# Patient Record
Sex: Female | Born: 1945
Health system: Southern US, Community
[De-identification: ages and names within clinical notes are randomized; demographics above are authoritative.]

## PROBLEM LIST (undated history)

## (undated) DIAGNOSIS — F32A Depression, unspecified: Secondary | ICD-10-CM

## (undated) DIAGNOSIS — I4891 Unspecified atrial fibrillation: Secondary | ICD-10-CM

## (undated) DIAGNOSIS — K219 Gastro-esophageal reflux disease without esophagitis: Secondary | ICD-10-CM

## (undated) DIAGNOSIS — T8859XA Other complications of anesthesia, initial encounter: Secondary | ICD-10-CM

## (undated) DIAGNOSIS — E039 Hypothyroidism, unspecified: Secondary | ICD-10-CM

## (undated) DIAGNOSIS — R011 Cardiac murmur, unspecified: Secondary | ICD-10-CM

## (undated) DIAGNOSIS — E785 Hyperlipidemia, unspecified: Secondary | ICD-10-CM

## (undated) DIAGNOSIS — I10 Essential (primary) hypertension: Secondary | ICD-10-CM

## (undated) DIAGNOSIS — M1990 Unspecified osteoarthritis, unspecified site: Secondary | ICD-10-CM

## (undated) DIAGNOSIS — F419 Anxiety disorder, unspecified: Secondary | ICD-10-CM

## (undated) DIAGNOSIS — F329 Major depressive disorder, single episode, unspecified: Secondary | ICD-10-CM

## (undated) DIAGNOSIS — Z9889 Other specified postprocedural states: Secondary | ICD-10-CM

## (undated) HISTORY — PX: OTHER SURGICAL HISTORY: SHX169

## (undated) HISTORY — PX: KNEE ARTHROSCOPY: SUR90

## (undated) HISTORY — PX: APPENDECTOMY: SHX54

## (undated) HISTORY — PX: BUNIONECTOMY: SHX129

## (undated) HISTORY — PX: THYROIDECTOMY: SHX17

## (undated) HISTORY — DX: Essential (primary) hypertension: I10

## (undated) HISTORY — PX: SHOULDER ARTHROSCOPY: SHX128

## (undated) HISTORY — PX: HEMORRHOID SURGERY: SHX153

## (undated) HISTORY — PX: ESOPHAGOGASTRODUODENOSCOPY: SHX1529

## (undated) HISTORY — PX: TOTAL ABDOMINAL HYSTERECTOMY: SHX209

## (undated) HISTORY — PX: TONSILLECTOMY: SUR1361

## (undated) HISTORY — DX: Unspecified atrial fibrillation: I48.91

---

## 2004-10-23 ENCOUNTER — Ambulatory Visit: Payer: Self-pay | Admitting: Cardiology

## 2004-10-30 ENCOUNTER — Ambulatory Visit: Payer: Self-pay | Admitting: Cardiology

## 2005-04-30 ENCOUNTER — Ambulatory Visit: Payer: Self-pay | Admitting: Internal Medicine

## 2005-05-21 ENCOUNTER — Ambulatory Visit: Payer: Self-pay | Admitting: Internal Medicine

## 2005-05-21 ENCOUNTER — Ambulatory Visit (HOSPITAL_COMMUNITY): Admission: RE | Admit: 2005-05-21 | Discharge: 2005-05-21 | Payer: Self-pay | Admitting: Internal Medicine

## 2005-05-21 ENCOUNTER — Encounter (INDEPENDENT_AMBULATORY_CARE_PROVIDER_SITE_OTHER): Payer: Self-pay | Admitting: Internal Medicine

## 2005-08-27 ENCOUNTER — Ambulatory Visit: Payer: Self-pay | Admitting: Internal Medicine

## 2005-11-12 ENCOUNTER — Ambulatory Visit (HOSPITAL_COMMUNITY): Admission: RE | Admit: 2005-11-12 | Discharge: 2005-11-13 | Payer: Self-pay | Admitting: Surgery

## 2005-11-12 ENCOUNTER — Encounter (INDEPENDENT_AMBULATORY_CARE_PROVIDER_SITE_OTHER): Payer: Self-pay | Admitting: Specialist

## 2009-12-28 ENCOUNTER — Ambulatory Visit: Payer: Self-pay | Admitting: Cardiology

## 2010-04-08 ENCOUNTER — Encounter: Admission: RE | Admit: 2010-04-08 | Discharge: 2010-04-08 | Payer: Self-pay | Admitting: Neurosurgery

## 2010-04-23 ENCOUNTER — Inpatient Hospital Stay (HOSPITAL_COMMUNITY): Admission: RE | Admit: 2010-04-23 | Discharge: 2010-04-26 | Payer: Self-pay | Admitting: Neurosurgery

## 2011-02-10 LAB — BASIC METABOLIC PANEL
Chloride: 101 mEq/L (ref 96–112)
GFR calc Af Amer: >60 mL/min (ref >60)
Potassium: 4 mEq/L (ref 3.5–5.1)
Sodium: 138 mEq/L (ref 135–145)

## 2011-02-10 LAB — DIFFERENTIAL
Eosinophils Absolute: 0.1 10*3/uL (ref 0.0–0.7)
Eosinophils Relative: 1 % (ref 0–5)
Lymphs Abs: 3.1 10*3/uL (ref 0.7–4.0)
Monocytes Relative: 11 % (ref 3–12)

## 2011-02-10 LAB — CBC
HCT: 44 % (ref 36.0–46.0)
MCV: 90.2 fL (ref 78.0–100.0)
RBC: 4.88 MIL/uL (ref 3.87–5.11)
WBC: 8.4 10*3/uL (ref 4.0–10.5)

## 2011-02-10 LAB — SURGICAL PCR SCREEN: MRSA, PCR: NEGATIVE

## 2011-04-11 NOTE — H&P (Signed)
NAMEREGAN, MCBRYAR              ACCOUNT NO.:  [id number]   MEDICAL RECORD NO.:  [id number]           PATIENT TYPE:   LOCATION:                                 FACILITY:   PHYSICIAN:  Lionel December, M.D.    DATE OF BIRTH:  25-May-1946   DATE OF ADMISSION:  DATE OF DISCHARGE:  LH                                HISTORY & PHYSICAL   PRIMARY CARE PHYSICIAN:  Dr. Sherril Croon.   CHIEF COMPLAINT:  Rectal bleeding.   HISTORY OF PRESENT ILLNESS:  Ms. Terwilliger is a 65 year old Caucasian female  who has had a two year history of intermittent hematochezia. She notes  bright red blood on the toilet paper and the water and mixed with her stool  about two months ago. She does have an element of chronic constipation as  well. She generally has three to four stools a week and is having some  significant straining. She has history of hemorrhoids and underwent  hemorrhoidectomy in July 2004. Prior to that, she had a flexible  sigmoidoscopy by Dr. Cleotis Nipper. She has had a colonoscopy, but it was in  1999. She had two tiny sigmoid diverticula. She has been taking herbal  laxatives twice a day. She complains of intermittent rectal spasms. She also  notes proctalgia and pruritus as well as some abdominal bloating and  flatulence. She does occasionally have alternating diarrhea and can have  anywhere from four to five loose stools a day but has not had any recently.   Ms. Forgy does have a history of chronic GERD and reflux esophagitis. She  has been having some intermittent breakthrough symptoms including heartburn  and acid reflux, especially nocturnally. She has been off PPI therapy and  only takes Prevacid or Prilosec intermittently. She denies any dysphagia or  odynophagia.   PAST MEDICAL HISTORY:  1.  Hemorrhoidectomy in July of 2004 by Dr. Cleotis Nipper.  2.  Hypertension.  3.  Chronic GERD with EGD on September 24, 1998. She had mild changes of      reflux esophagitis limited to the EG junction.  Otherwise normal EGD.  4.  She has history of arthritis.  5.  Tonsillectomy.  6.  Bunionectomy.   CURRENT MEDICATIONS:  1.  Prilosec 20 mg daily p.r.n.  2.  Prinizide 40 mg daily.  3.  Toprol-XL 100 mg daily.  4.  Herbal laxative b.i.d.  5.  Tylenol p.r.n.   ALLERGIES:  SULFA, PENICILLIN, SOMA, EPINEPHRINE, and ALTACE.   FAMILY HISTORY:  Noncontributory. Both parents are deceased at age 38 and  6. Mother had history of coronary artery disease and MI. She has multiple  siblings with hypertension and coronary artery disease.   SOCIAL HISTORY:  Ms. Brett has been married for nine years. This is her  second marriage. She has two grown healthy children. She is employed as an  Print production planner at [employer]. She denies any tobacco, alcohol, or  drug use.   REVIEW OF SYSTEMS:  CONSTITUTIONAL:  Denies any fevers or chills. Denies any  anorexia. Denies any early satiety. Weight is stable. Does have some  fatigue. CARDIOVASCULAR:  Denies any chest pain or palpations. PULMONARY:  Denies any shortness of breath, dyspnea, cough, or hemoptysis.  GASTROINTESTINAL:  See HPI.   PHYSICAL EXAMINATION:  VITAL SIGNS:  Weight 190.5 pounds, height 64 inches,  temperature 97.9, blood pressure 138/82, pulse 68.  GENERAL:  Ms. Lyter is a 65 year old well-developed, well-nourished,  Caucasian female in no acute distress.  HEENT:  Sclerae are clear and nonicteric. Conjunctivae are pink. Oropharynx  pink and moist without any lesions.  NECK:  Supple. She does have soft tissue mass extending on the right just  lateral to her thyroid, possibly a goiter. Left thyroid appears clear.  CHEST:  Heart regular rate and rhythm with normal S1 and S2 without murmurs,  clicks, rubs, or gallops.  LUNGS:  Clear to auscultation bilaterally.  ABDOMEN:  Positive bowel sounds x4. No bruits auscultated. Soft, nontender,  nondistended without mass or hepatosplenomegaly. No rebound tenderness or  guarding.   RECTAL:  Reveals a stenotic anal sphincter with some narrowing. She also has  a hemorrhoid noted between 3 and 5 o'clock. Internal exam again reveals some  scar tissue. No internal masses palpated. Small amount of light brown stool  was obtained from the vault which was hemoccult positive.  EXTREMITIES:  1+ lower extremity edema bilaterally.  SKIN:  Pink, warm, and dry without any rash or jaundice.   IMPRESSION:  Ms. Shutters is a 65 year old Caucasian female with  intermittent hematochezia. She has also underlying history of chronic  symptoms suspicious for IBS, most recently chronic constipation and some  significant straining. She does have hemorrhoids found on exam. She also has  a somewhat stenotic anal canal, and her bleeding may be coming from  hemorrhoids; however, given the fact that she has not had a colonoscopy in  seven years, we need to proceed on with colonoscopy to rule out colorectal  carcinoma. Also, will treat her constipation with a goal of having a soft  daily bowel movement.   As far as her breakthrough nocturnal GERD symptoms, we will ask her to give  a trial of PPI. She can either take Prevacid as she had some. We have also  give her samples of Zegerid to be used on an as needed basis.   As far as her thyroid is concerned, we will ask her to follow up with Dr.  Sherril Croon, her primary care physician, as she does possibly have a right goiter  versus soft tissue swelling.   RECOMMENDATIONS:  1.  She is to schedule appointment with Dr. Sherril Croon to check a thyroid.  2.  Will schedule colonoscopy with Dr. Karilyn Cota in the near future. I have      discussed the procedure including risks and benefits which include but      are not limited to bleeding, infection, perforation, drug reaction. She      agrees with the plan, and consent will be obtained.  3.  Prescription was given for MiraLax 17 g daily for p.r.n. constipation     _______________ for one month with one refill.  4.   Anusol HC suppositories 1 p.r. b.i.d. #20 with no refills.  5.  She was given 20 Zegerid samples to be taken once daily.  6.  Further recommendations pending procedure.       KC/MEDQ  D:  04/30/2005  T:  04/30/2005  Job:  [id number]   cc:   Doreen Beam  [address]  [place]  [place] [address]  Fax: [phone]

## 2011-04-11 NOTE — Op Note (Signed)
[name], [name]              ACCOUNT NO.:  [id number]   MEDICAL RECORD NO.:  [id number]          PATIENT TYPE:  AMB   LOCATION:  DAY                           FACILITY:  [facility]   PHYSICIAN:  Lionel December, M.D.    DATE OF BIRTH:  November 03, 1946   DATE OF PROCEDURE:  05/21/2005  DATE OF DISCHARGE:                                 OPERATIVE REPORT   PROCEDURE:  Colonoscopy, terminal ileoscopy.   INDICATIONS:  [name] is a 65 year old Caucasian female with history of  constipation who has chronic/intermittent hematochezia who has not responded  symptomatic therapy. We saw her in the office about three weeks ago. She was  begun on MiraLax and she was also given a course of Anusol suppositories for  10 days. Her stools are now not as hard; however, she is still having  intermittent hematochezia. She is undergoing diagnostic colonoscopy. She  points to all her problems since having hemorrhoidectomy in July2004.   Procedure risks were reviewed the patient, informed consent was obtained.   PREMEDICATION:  Demerol 50 milligrams IV, Versed 5 milligrams IV, atropine  4.3 milligrams IV.   FINDINGS:  Procedure performed in endoscopy suite. The patient's vital signs  and O2 sat were monitored during procedure and remained stable. The patient  was placed left lateral position and rectal examination performed. Anal  canal was felt to be somewhat narrowed but was not felt to be critical.  Pediatric Olympus videoscope was placed rectum and advanced under vision  into sigmoid colon beyond. Preparation was excellent. She had diffuse  pigmentation which was quite dense in ascending colon and cecum consistent  with melanosis coli.  Cecal landmarks i.e. appendiceal orifice and ileocecal  valve well seen. Pictures taken for the record. Short segment of TI was also  examined and there two erosions. One of these were biopsied and biopsies  also taken from surrounding mucosa. As the scope was withdrawn  colonic  mucosa was examined for the second time and there were no erosions, ulcers,  polyps or mass. No diverticular changes were noted either. Rectal mucosa was  normal. Scope was retroflexed to examine anorectal junction. She had a scar  just above the dentate line and some erythema but no ulceration or fissure  was noted. Endoscope was then withdrawn. The patient tolerated the procedure  well.   FINAL DIAGNOSIS:  Two ileal erosions. Biopsy taken to make sure she does not  have granulomas.  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  Perianal scarring from prior hemorrhoidectomy but no ulceration,  fissure or other abnormalities.   RECOMMENDATIONS:  I will be contacting the patient with biopsy results. The  meantime, she will continue MiraLax 10-17 grams daily. Goal is one soft BM  daily or every other day.  I would also recommend continue with high fiber  diet and add FiberChoice 2 tablets to be chewed q.d.Marland Kitchen She will keep written  record as to the frequency of bleeding episodes as well as the bowel  movements. Will plan to see in the office in eight weeks from now.  NR/MEDQ  D:  05/21/2005  T:  05/21/2005  Job:  [id number]   cc:   Doreen Beam  [address]  [place]  [place] [address]  Fax: [phone]

## 2011-04-11 NOTE — Op Note (Signed)
[name], [name] NO.:  [id number]   MEDICAL RECORD NO.:  [id number]          PATIENT TYPE:  OIB   LOCATION:  1326                         FACILITY:  [facility]   PHYSICIAN:  Deborah Heckler, MD      DATE OF BIRTH:  1946-10-02   DATE OF PROCEDURE:  11/12/2005  DATE OF DISCHARGE:  11/13/2005                                 OPERATIVE REPORT   PREOPERATIVE DIAGNOSES:  Right thyroid nodule, thyroid goiter with  compressive symptoms.   POSTOPERATIVE DIAGNOSES:  Right thyroid nodule, thyroid goiter with  compressive symptoms.   PROCEDURE:  Right thyroid lobectomy.   SURGEON:  Deborah Heckler, MD.   ASSISTANT:  Deborah Pu. Cornett, MD.   ANESTHESIA:  General per Dr. Eilene Roberts.   ESTIMATED BLOOD LOSS:  Minimal.   PREPARATION:  Betadine.   COMPLICATIONS:  None.   INDICATIONS:  The patient is a 65 year old white female from [place],  [place]. She presents at the request of Dr. Dorisann Frames for  evaluation of dominant right thyroid nodule with compressive symptoms. The  patient had undergone sequential ultrasounds which showed a progressive  increase in the size of the right thyroid nodule. This was a complex  dominant nodule measuring 3.1 x 1.9 x 2.4 cm. Fine-needle aspiration had  been benign. Workup also included calcitonin level which was normal. The  patient now comes to surgery for resection.   DESCRIPTION OF PROCEDURE:  The procedure was done in OR #11 at the [facility]. The patient was brought to the operating room,  placed in a supine position on the operating room table. Following the  administration of general anesthesia, the patient is prepped and draped in  usual strict aseptic fashion. After ascertaining that an adequate level of  anesthesia had been obtained, a Kocher incision is made with a #15 blade,  dissection was carried down through subcutaneous tissues and platysma.  Hemostasis was obtained with the  electrocautery. Skin flaps were developed  cephalad and caudad from the thyroid notch to the sternal notch. A Mahorner  self-retaining retractor was placed for exposure. Strap muscles were incised  in the midline and dissection was begun on the left side of the neck. Strap  muscles were incised in the midline and dissection is begun on the left side  of the neck. The strap muscles are reflected laterally. The left thyroid  lobe is completely exposed. It is grossly normal. On palpation,  there are  no dominant or discrete masses. There is no associated lymphadenopathy.  There are no cystic lesions. The left lobe appears completely normal.   The strap muscles on the right are now reflected laterally. The right  thyroid lobe is exposed. There is a dominant nodule present in the mid  portion of the right lobe. This is soft, discrete,  mobile. It does not  appear to be attached in any of the surrounding tissues. The strap muscles  are reflected laterally. Venous tributaries are divided between small  Ligaclips. The superior pole vessels are dissected out, ligated in  continuity with 2-0 silk ties  and medium Ligaclips and divided. The superior  parathyroid gland is identified and preserved. The gland is rolled  anteriorly. The inferior venous tributaries are divided between small  Ligaclips. The branches of the inferior thyroid artery are divided between  small and medium Ligaclips. The gland is rolled anteriorly. The inferior  parathyroid tissue is identified and preserved. Recurrent laryngeal nerve is  identified and preserved. The ligament of Allyson Sabal is transected with the  electrocautery and the gland is rolled up and onto the anterior trachea. The  isthmus is mobilized across the midline. The isthmus is transected at its  junction with the left thyroid lobe between hemostats and suture ligated  with 3-0 Vicryl suture ligatures. The right thyroid lobe and isthmus is  submitted to pathology  where Dr. Jimmy Roberts performed frozen section  analysis. This shows a benign nodule consistent with adenomatous nodule. No  features of malignancy are identified on frozen section. The right neck is  irrigated with warm saline. Good hemostasis is noted. Surgicel was placed  over the area of the recurrent nerve and parathyroid glands. The strap  muscles are reapproximated in the midline with interrupted 3-0 Vicryl  sutures. Platysma was closed with interrupted 3-0 Vicryl sutures. Skin is  closed with a running 4-0 Vicryl subcuticular suture. Wound is washed and  dried and Benzoin and Steri-Strips are applied. Sterile dressings are  applied. The patient is awakened from anesthesia and brought to the recovery  room in stable condition. The patient tolerated the procedure well.      Deborah Heckler, MD  Electronically Signed     TMG/MEDQ  D:  11/12/2005  T:  11/14/2005  Job:  [id number]   cc:   Dorisann Frames, M.D.  Fax: [phone]   Doreen Beam  Fax: [phone]

## 2012-06-02 ENCOUNTER — Encounter (INDEPENDENT_AMBULATORY_CARE_PROVIDER_SITE_OTHER): Payer: Self-pay | Admitting: *Deleted

## 2012-06-15 ENCOUNTER — Other Ambulatory Visit (INDEPENDENT_AMBULATORY_CARE_PROVIDER_SITE_OTHER): Payer: Self-pay | Admitting: *Deleted

## 2012-06-15 ENCOUNTER — Ambulatory Visit (INDEPENDENT_AMBULATORY_CARE_PROVIDER_SITE_OTHER): Payer: Medicare Other | Admitting: Internal Medicine

## 2012-06-15 ENCOUNTER — Encounter (HOSPITAL_COMMUNITY): Payer: Self-pay | Admitting: Pharmacy Technician

## 2012-06-15 ENCOUNTER — Encounter (INDEPENDENT_AMBULATORY_CARE_PROVIDER_SITE_OTHER): Payer: Self-pay | Admitting: Internal Medicine

## 2012-06-15 ENCOUNTER — Telehealth (INDEPENDENT_AMBULATORY_CARE_PROVIDER_SITE_OTHER): Payer: Self-pay | Admitting: *Deleted

## 2012-06-15 VITALS — BP 156/90 | HR 66 | Temp 98.2°F | Ht 63.0 in | Wt 177.3 lb

## 2012-06-15 DIAGNOSIS — E7800 Pure hypercholesterolemia, unspecified: Secondary | ICD-10-CM | POA: Insufficient documentation

## 2012-06-15 DIAGNOSIS — Z1211 Encounter for screening for malignant neoplasm of colon: Secondary | ICD-10-CM

## 2012-06-15 DIAGNOSIS — R195 Other fecal abnormalities: Secondary | ICD-10-CM

## 2012-06-15 DIAGNOSIS — K921 Melena: Secondary | ICD-10-CM

## 2012-06-15 DIAGNOSIS — I10 Essential (primary) hypertension: Secondary | ICD-10-CM | POA: Insufficient documentation

## 2012-06-15 MED ORDER — PEG-KCL-NACL-NASULF-NA ASC-C 100 G PO SOLR: 1.0000 | Freq: Once | ORAL | Status: DC

## 2012-06-15 NOTE — Telephone Encounter (Signed)
Patient needs movi prep 

## 2012-06-15 NOTE — Progress Notes (Signed)
Subjective:     Patient ID: [name], female   DOB: [birth year], 66 y.o.   MRN: [id number]  HPI [name] is a 66 yr old female referred to our office by Dr. Sherril Croon for blood in her stool/colonoscopy. She tells me she underwent a hemorrhoidectomy in 2005. She says she feels like her rectum is smaller now.  She says it is hard to have a BM.  She takes Miralax to keep her stools soft. Appetite is good. No weight loss. Occasional lower abdominal pain. BM x 1 a day. No melena.  She has seen bright red blood on the tissue. One hemocult card was positive per Dr Sherril Croon 's notes.  Colonoscopy in 2006. FINAL DIAGNOSIS: Two ileal erosions. Biopsy taken to make sure she does not  have granulomas. 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 Perianal scarring from prior hemorrhoidectomy but no ulceration,  fissure or other abnormalities.  INDICATIONS: [name] is a 66 year old Caucasian female with history of  constipation who has chronic/intermittent hematochezia who has not responded  symptomatic therapy. We saw her in the office about three weeks ago. She was  begun on MiraLax and she was also given a course of Anusol suppositories for  05/17/2012 H and H 13.8 and 42.3, MCV 92    Review of Systems see hpi Current Outpatient Prescriptions  Medication Sig Dispense Refill  . aluminum & magnesium hydroxide (MAALOX) 225-200 MG/5ML suspension Take by mouth every 6 (six) hours as needed.      . calcium carbonate (OS-CAL) 600 MG TABS Take 600 mg by mouth 2 (two) times daily with a meal.      . cholecalciferol (VITAMIN D) 1000 UNITS tablet Take 1,000 Units by mouth 2 (two) times daily.      . citalopram (CELEXA) 20 MG tablet Take 20 mg by mouth daily.      Marland Kitchen doxazosin (CARDURA) 2 MG tablet Take 2 mg by mouth at bedtime.      Marland Kitchen levothyroxine (SYNTHROID, LEVOTHROID) 75 MCG tablet Take 75 mcg by mouth daily.      . simvastatin (ZOCOR) 20 MG tablet Take 20 mg by mouth every evening.      .  valsartan-hydrochlorothiazide (DIOVAN-HCT) 320-12.5 MG per tablet Take 1 tablet by mouth daily.       Past Medical History  Diagnosis Date  . High cholesterol   . Hypertension   . Hypothyroid    Past Surgical History  Procedure Date  . Thyroidectomy   . Total abdominal hysterectomy   . Hemorrhoid surgery   . Tonsillectomy   . Bunionectomy   . Hammer toe repair    Allergies  Allergen Reactions  . Penicillins   . Sulfa Antibiotics    Family Status  Relation Status Death Age  . Mother Deceased     Mi  . Father Deceased     Prostate cancer  . Sister Alive     good health  . Brother Alive     Chronic leukemia   History   Social History  . Marital Status: Married    Spouse Name: N/A    Number of Children: N/A  . Years of Education: N/A   Occupational History  . Not on file.   Social History Main Topics  . Smoking status: Never Smoker   . Smokeless tobacco: Not on file  . Alcohol Use: No  . Drug Use: No  . Sexually Active: Not on file   Other Topics Concern  .  Not on file   Social History Narrative  . No narrative on file        Objective:   Physical Exam Filed Vitals:   06/15/12 1104  Height: 5\' 3"  (1.6 m)  Weight: 177 lb 4.8 oz (80.423 kg)   Alert and oriented. Skin warm and dry. Oral mucosa is moist.   . Sclera anicteric, conjunctivae is pink. Thyroid not enlarged. No cervical lymphadenopathy. Lungs clear. Heart regular rate and rhythm.  Abdomen is soft. Bowel sounds are positive. No hepatomegaly. No abdominal masses felt. No tenderness.  No edema to lower extremities.       Assessment:    Rectal bleeding. Suspect bleeding from previous hemorrhoidectomy, colonic polyp, neoplasm needs to be ruled out.    Plan:    Colonoscopy.The risks and benefits such as perforation, bleeding, and infection were reviewed with the patient and is agreeable.

## 2012-06-15 NOTE — Patient Instructions (Addendum)
Colonoscopy with Dr. Rehman 

## 2012-06-17 MED ORDER — SODIUM CHLORIDE 0.45 % IV SOLN
Freq: Once | INTRAVENOUS | Status: AC
  Administered 2012-06-18: 07:00:00 via INTRAVENOUS

## 2012-06-18 ENCOUNTER — Encounter (HOSPITAL_COMMUNITY)
Admission: RE | Disposition: A | Discharge status: Home Health | Payer: Self-pay | Source: Ambulatory Visit | Attending: Internal Medicine

## 2012-06-18 ENCOUNTER — Ambulatory Visit (HOSPITAL_COMMUNITY)
Admission: RE | Admit: 2012-06-18 | Discharge: 2012-06-18 | Disposition: A | Discharge status: Home Health | Payer: Medicare Other | Source: Ambulatory Visit | Attending: Internal Medicine | Admitting: Internal Medicine

## 2012-06-18 ENCOUNTER — Encounter (HOSPITAL_COMMUNITY): Payer: Self-pay | Admitting: *Deleted

## 2012-06-18 DIAGNOSIS — K644 Residual hemorrhoidal skin tags: Secondary | ICD-10-CM

## 2012-06-18 DIAGNOSIS — R195 Other fecal abnormalities: Secondary | ICD-10-CM

## 2012-06-18 DIAGNOSIS — K921 Melena: Secondary | ICD-10-CM

## 2012-06-18 DIAGNOSIS — K5730 Diverticulosis of large intestine without perforation or abscess without bleeding: Secondary | ICD-10-CM

## 2012-06-18 DIAGNOSIS — D126 Benign neoplasm of colon, unspecified: Secondary | ICD-10-CM

## 2012-06-18 DIAGNOSIS — E7800 Pure hypercholesterolemia, unspecified: Secondary | ICD-10-CM | POA: Insufficient documentation

## 2012-06-18 DIAGNOSIS — K5900 Constipation, unspecified: Secondary | ICD-10-CM

## 2012-06-18 DIAGNOSIS — I10 Essential (primary) hypertension: Secondary | ICD-10-CM | POA: Insufficient documentation

## 2012-06-18 DIAGNOSIS — K633 Ulcer of intestine: Secondary | ICD-10-CM

## 2012-06-18 DIAGNOSIS — Z79899 Other long term (current) drug therapy: Secondary | ICD-10-CM | POA: Insufficient documentation

## 2012-06-18 HISTORY — DX: Anxiety disorder, unspecified: F41.9

## 2012-06-18 HISTORY — DX: Depression, unspecified: F32.A

## 2012-06-18 HISTORY — PX: COLONOSCOPY: SHX5424

## 2012-06-18 HISTORY — DX: Major depressive disorder, single episode, unspecified: F32.9

## 2012-06-18 SURGERY — COLONOSCOPY
Anesthesia: Moderate Sedation

## 2012-06-18 MED ORDER — MIDAZOLAM HCL 5 MG/5ML IJ SOLN
INTRAMUSCULAR | Status: DC | PRN
  Administered 2012-06-18 (×2): 1 mg via INTRAVENOUS
  Administered 2012-06-18: 2 mg via INTRAVENOUS

## 2012-06-18 MED ORDER — MIDAZOLAM HCL 5 MG/5ML IJ SOLN
INTRAMUSCULAR | Status: AC
  Filled 2012-06-18: qty 10

## 2012-06-18 MED ORDER — ATROPINE SULFATE 1 MG/ML IJ SOLN
INTRAMUSCULAR | Status: DC | PRN
  Administered 2012-06-18: .5 mg via INTRAVENOUS

## 2012-06-18 MED ORDER — MEPERIDINE HCL 50 MG/ML IJ SOLN
INTRAMUSCULAR | Status: DC | PRN
  Administered 2012-06-18: 25 mg via INTRAVENOUS

## 2012-06-18 MED ORDER — MEPERIDINE HCL 50 MG/ML IJ SOLN
INTRAMUSCULAR | Status: AC
  Filled 2012-06-18: qty 1

## 2012-06-18 MED ORDER — STERILE WATER FOR IRRIGATION IR SOLN
Status: DC | PRN
  Administered 2012-06-18: 08:00:00

## 2012-06-18 MED ORDER — ATROPINE SULFATE 1 MG/ML IJ SOLN
INTRAMUSCULAR | Status: AC
  Filled 2012-06-18: qty 1

## 2012-06-18 NOTE — OR Nursing (Signed)
1478 Pt's heartrate 42-44 bpm.  Medication list reviewed.  Per Dr Karilyn Cota verbal order:  Administered 0.5 mg Atropine for heart rate.  0745 heart rate 65bpm.

## 2012-06-18 NOTE — H&P (Signed)
[name] is an 66 y.o. female.   Chief Complaint: Patient is here for colonoscopy. HPI: Patient is 66 year old Caucasian female who presents with intermittent hematochezia. She notices blood in her bowel movements. She also complains of constipation despite taking MiraLax. She denies abdominal pain anorexia or weight loss. Patient's last colonoscopy was over 7 years ago. Family history is negative for colorectal carcinoma.  Past Medical History  Diagnosis Date  . High cholesterol   . Hypertension   . Hypothyroid   . Depression   . Anxiety     Past Surgical History  Procedure Date  . Thyroidectomy   . Total abdominal hysterectomy   . Hemorrhoid surgery   . Tonsillectomy   . Bunionectomy   . Hammer toe repair    neck surgery for disc disease.  Family History  Problem Relation Age of Onset  . Colon cancer Neg Hx    Social History:  reports that she has never smoked. She does not have any smokeless tobacco history on file. She reports that she does not drink alcohol or use illicit drugs.  Allergies:  Allergies  Allergen Reactions  . Penicillins Rash  . Sulfa Antibiotics Rash    Medications Prior to Admission  Medication Sig Dispense Refill  . calcium carbonate (OS-CAL) 600 MG TABS Take 600 mg by mouth 2 (two) times daily with a meal.      . cholecalciferol (VITAMIN D) 1000 UNITS tablet Take 1,000 Units by mouth 2 (two) times daily.      . citalopram (CELEXA) 20 MG tablet Take 20 mg by mouth daily.      Marland Kitchen doxazosin (CARDURA) 2 MG tablet Take 2 mg by mouth at bedtime.      Marland Kitchen levothyroxine (SYNTHROID, LEVOTHROID) 75 MCG tablet Take 75 mcg by mouth daily.      . peg 3350 powder (MOVIPREP) 100 G SOLR Take 1 kit (100 g total) by mouth once.  1 kit  0  . polyethylene glycol powder (GLYCOLAX/MIRALAX) powder Take 17 g by mouth daily.      . simvastatin (ZOCOR) 20 MG tablet Take 20 mg by mouth every evening.      . valsartan-hydrochlorothiazide (DIOVAN-HCT) 320-12.5 MG per  tablet Take 1 tablet by mouth daily.        No results found for this or any previous visit (from the past 48 hour(s)). No results found.  ROS  Blood pressure 152/69, pulse 59, temperature 97.9 F (36.6 C), temperature source Oral, resp. rate 24, height 5\' 3"  (1.6 m), weight 177 lb (80.287 kg), SpO2 97.00%. Physical Exam  Constitutional: She appears well-developed and well-nourished.  HENT:  Mouth/Throat: Oropharynx is clear and moist.  Eyes: Conjunctivae are normal. No scleral icterus.  Neck: No thyromegaly present.  Cardiovascular: Normal rate, regular rhythm and normal heart sounds.   No murmur heard. Respiratory: Effort normal and breath sounds normal.  GI: Soft. She exhibits no distension and no mass.  Musculoskeletal: She exhibits no edema.  Lymphadenopathy:    She has no cervical adenopathy.  Neurological: She is alert.  Skin: Skin is warm and dry.     Assessment/Plan; Hematochezia and constipation. Diagnostic colonoscopy.  REHMAN,NAJEEB U 06/18/2012, 7:35 AM

## 2012-06-18 NOTE — Op Note (Signed)
COLONOSCOPY PROCEDURE REPORT  PATIENT:  [name]  MR#:  [id number] Birthdate:  Jul 15, 1946, 66 y.o., female Endoscopist:  Dr. Malissa Hippo, MD Referred By:  Dr. Ignatius Specking, MD Procedure Date: 06/18/2012  Procedure:   Colonoscopy  Indications:  Patient is 66 year old Caucasian female who has intermittent hematochezia and constipation despite taking MiraLax. She is undergoing diagnostic colonoscopy.  Informed Consent:  The procedure and risks were reviewed with the patient and informed consent was obtained.  Medications:  Demerol 25 mg IV Versed 4 mg IV  Description of procedure:  After a digital rectal exam was performed, that colonoscope was advanced from the anus through the rectum and colon to the area of the cecum, ileocecal valve and appendiceal orifice. The cecum was deeply intubated. These structures were well-seen and photographed for the record. From the level of the cecum and ileocecal valve, the scope was slowly and cautiously withdrawn. The mucosal surfaces were carefully surveyed utilizing scope tip to flexion to facilitate fold flattening as needed. The scope was pulled down into the rectum where a thorough exam including retroflexion was performed. Terminal ileum was also examined.  Findings:   Prep satisfactory. Single ileal erosion. Scattered erosions throughout the colon. Or of these were biopsied for histology from transverse colon. Scattered diverticula at sigmoid colon. Oh millimeter polyp ablated via cold biopsy from sigmoid colon. Normal rectal mucosa. Small hemorrhoids below the dentate line.  Therapeutic/Diagnostic Maneuvers Performed:  See above  Complications:  None  Cecal Withdrawal Time:  23 minutes  Impression:  Ileal and colonic erosions most likely secondary to NSAID use. Biopsy taken from transverse colon erosions for routine histology. 4 mm polyp ablated via cold biopsy from sigmoid colon. Sigmoid colon diverticulosis. External  hemorrhoids.  Recommendations:  Standard instructions given. Patient advised not to take Aleve but can take Advil 1-2 tablets daily as needed. I will contact patient with biopsy results and further recommendations.  [name] U  06/18/2012 8:24 AM  CC: Dr. Ignatius Specking., MD & Dr. Bonnetta Barry ref. provider found

## 2012-06-22 ENCOUNTER — Encounter (HOSPITAL_COMMUNITY): Payer: Self-pay | Admitting: Internal Medicine

## 2012-06-30 ENCOUNTER — Encounter (INDEPENDENT_AMBULATORY_CARE_PROVIDER_SITE_OTHER): Payer: Self-pay | Admitting: *Deleted

## 2012-07-06 ENCOUNTER — Encounter (INDEPENDENT_AMBULATORY_CARE_PROVIDER_SITE_OTHER): Payer: Self-pay

## 2012-07-09 ENCOUNTER — Telehealth (INDEPENDENT_AMBULATORY_CARE_PROVIDER_SITE_OTHER): Payer: Self-pay | Admitting: *Deleted

## 2012-07-09 NOTE — Telephone Encounter (Signed)
Per Dr.Rehman the patient may take 1/2 scoop daily of the Miralax. Patient called and made aware.

## 2012-07-09 NOTE — Telephone Encounter (Signed)
When she had her procedure, she was told to stop taking the Miralax and start taking Benefirber. Zola said this is hardening her stool which is making her have trouble using the bathroom. What would Dr. Karilyn Cota advised her to do. The return phone number is [phone].

## 2012-12-13 DIAGNOSIS — R079 Chest pain, unspecified: Secondary | ICD-10-CM

## 2012-12-14 DIAGNOSIS — R079 Chest pain, unspecified: Secondary | ICD-10-CM

## 2013-01-25 ENCOUNTER — Encounter (INDEPENDENT_AMBULATORY_CARE_PROVIDER_SITE_OTHER): Payer: Self-pay | Admitting: Internal Medicine

## 2013-01-25 ENCOUNTER — Ambulatory Visit (INDEPENDENT_AMBULATORY_CARE_PROVIDER_SITE_OTHER): Payer: Medicare Other | Admitting: Internal Medicine

## 2013-01-25 VITALS — BP 130/80 | HR 78 | Temp 99.0°F | Resp 20 | Ht 63.0 in | Wt 177.1 lb

## 2013-01-25 DIAGNOSIS — R7989 Other specified abnormal findings of blood chemistry: Secondary | ICD-10-CM

## 2013-01-25 DIAGNOSIS — R7401 Elevation of levels of liver transaminase levels: Secondary | ICD-10-CM | POA: Insufficient documentation

## 2013-01-25 DIAGNOSIS — R7402 Elevation of levels of lactic acid dehydrogenase (LDH): Secondary | ICD-10-CM

## 2013-01-25 DIAGNOSIS — K219 Gastro-esophageal reflux disease without esophagitis: Secondary | ICD-10-CM | POA: Insufficient documentation

## 2013-01-25 DIAGNOSIS — R945 Abnormal results of liver function studies: Secondary | ICD-10-CM

## 2013-01-25 NOTE — Progress Notes (Signed)
Presenting complaint;  Elevated transaminases.  Subjective:  [name] is 67 year old Caucasian female who is here for evaluation of abnormal LFTs. She was last seen in July 2013 when she had colonoscopy for intermittent hematochezia and constipation. She had ileal and colonic erosions felt to be secondary to NSAID therapy. She has small polyp removed from sigmoid colon which was inflammatory. She also had sigmoid diverticulosis and external hemorrhoids. She initially was noted to have mildly elevated ALT back in June 2013. LFTs in October 2013 were normal. She was hospitalized at [facility] for chest pain which turned out to be secondary to GERD. During that admission she was told by Dr. Sherril Croon that her transaminases are abnormal and he advised further evaluation and she is therefore here. She denies abdominal pain or pruritus. 3 years ago she was 162 pounds and now she's 177 pounds she walks for 45 minutes at least 3 times a week so that she can lose some weight. There is no history of icteric hepatitis or jaundice. Family history is negative for liver disease. Patient has been on simvastatin for 4-5 years. About 2 years ago she decided to drop the dose from 40 mg to 20 mg on her own but she does not recall that she ever had any side effects. She is never been vaccinated for hepatitis A or B. She has very good appetite. She denies nausea vomiting melena or rectal bleeding. She does not smoke cigarettes or drink alcohol.  Current Medications: Current Outpatient Prescriptions  Medication Sig Dispense Refill  . calcium carbonate (OS-CAL) 600 MG TABS Take 600 mg by mouth 2 (two) times daily with a meal.      . cholecalciferol (VITAMIN D) 1000 UNITS tablet Take 1,000 Units by mouth daily.       . citalopram (CELEXA) 20 MG tablet Take 20 mg by mouth daily.      Marland Kitchen doxazosin (CARDURA) 2 MG tablet Take 2 mg by mouth at bedtime.      Marland Kitchen levothyroxine (SYNTHROID, LEVOTHROID) 75 MCG tablet Take 75 mcg by mouth daily.       Marland Kitchen MAGNESIUM GLYCINATE PLUS PO Take by mouth. Patient states that she takes 1-2 daily - This is a herbal form Science writer      . simvastatin (ZOCOR) 20 MG tablet Take 20 mg by mouth every evening.      . valsartan-hydrochlorothiazide (DIOVAN-HCT) 320-12.5 MG per tablet Take 1 tablet by mouth daily.       No current facility-administered medications for this visit.     Objective: Blood pressure 130/80, pulse 78, temperature 99 F (37.2 C), temperature source Oral, resp. rate 20, height 5\' 3"  (1.6 m), weight 177 lb 1.6 oz (80.332 kg). Patient is alert and in no acute distress. Conjunctiva is pink. Sclera is nonicteric Oropharyngeal mucosa is normal. No neck masses or thyromegaly noted. Cardiac exam with regular rhythm normal S1 and S2. No murmur or gallop noted. Lungs are clear to auscultation. Abdomen is full but soft and nontender without organomegaly or masses  No LE edema or clubbing noted.  Labs/studies Results: AST/ALT on 05/17/2012 was 32 and 42 respectively. AST/ALT on 09/13/2012 was 21 and 25. Lab from January 2014 requested.  Assessment:  #1. Elevated transaminases. She has no symptoms or stigmata of chronic liver. Suspect she may have fatty liver. Will consider limited workup looking for hepatitis B, C. and iron overload. I do not believe her mildly elevated ALT is secondary simvastatin.   Plan:  Hepatobiliary ultrasound.  Hepatitis B surface antigen, hepatitis C virus antibody and serum ferritin levels. LFTs.

## 2013-01-25 NOTE — Patient Instructions (Signed)
Continue regular walking as you're doing. Try to do it 4 times a week. Physician will contact you with results of blood work and ultrasound.

## 2013-01-26 LAB — HEPATIC FUNCTION PANEL
AST: 18 U/L (ref 0–37)
Albumin: 4.3 g/dL (ref 3.5–5.2)
Total Bilirubin: 0.7 mg/dL (ref 0.3–1.2)

## 2013-01-26 LAB — FERRITIN: Ferritin: 32 ng/mL (ref 10–291)

## 2013-01-26 LAB — HEPATITIS C ANTIBODY: HCV Ab: NEGATIVE

## 2013-01-26 LAB — HEPATITIS B SURFACE ANTIGEN: Hepatitis B Surface Ag: NEGATIVE

## 2013-02-01 ENCOUNTER — Ambulatory Visit (HOSPITAL_COMMUNITY)
Admission: RE | Admit: 2013-02-01 | Discharge: 2013-02-01 | Disposition: A | Discharge status: Home / Self Care | Payer: Medicare Other | Source: Ambulatory Visit | Attending: Internal Medicine | Admitting: Internal Medicine

## 2013-02-01 DIAGNOSIS — R945 Abnormal results of liver function studies: Secondary | ICD-10-CM

## 2013-02-01 DIAGNOSIS — R7401 Elevation of levels of liver transaminase levels: Secondary | ICD-10-CM | POA: Insufficient documentation

## 2013-02-01 DIAGNOSIS — R7402 Elevation of levels of lactic acid dehydrogenase (LDH): Secondary | ICD-10-CM | POA: Insufficient documentation

## 2013-02-01 DIAGNOSIS — R748 Abnormal levels of other serum enzymes: Secondary | ICD-10-CM | POA: Insufficient documentation

## 2013-02-04 ENCOUNTER — Telehealth (INDEPENDENT_AMBULATORY_CARE_PROVIDER_SITE_OTHER): Payer: Self-pay | Admitting: *Deleted

## 2013-02-04 DIAGNOSIS — R7401 Elevation of levels of liver transaminase levels: Secondary | ICD-10-CM

## 2013-02-04 NOTE — Telephone Encounter (Signed)
Per Dr.Rehman the patient will need to repeat labs in 6 months

## 2013-02-16 ENCOUNTER — Encounter (INDEPENDENT_AMBULATORY_CARE_PROVIDER_SITE_OTHER): Payer: Self-pay

## 2013-07-20 ENCOUNTER — Encounter (INDEPENDENT_AMBULATORY_CARE_PROVIDER_SITE_OTHER): Payer: Self-pay | Admitting: *Deleted

## 2013-07-20 ENCOUNTER — Other Ambulatory Visit (INDEPENDENT_AMBULATORY_CARE_PROVIDER_SITE_OTHER): Payer: Self-pay | Admitting: *Deleted

## 2013-07-20 DIAGNOSIS — R7401 Elevation of levels of liver transaminase levels: Secondary | ICD-10-CM

## 2013-08-05 LAB — HEPATIC FUNCTION PANEL
AST: 15 U/L (ref 0–37)
Albumin: 4.3 g/dL (ref 3.5–5.2)
Total Bilirubin: 1.2 mg/dL (ref 0.3–1.2)

## 2013-08-09 NOTE — Progress Notes (Signed)
Apt has been scheduled for 10/57/14 with Dr. Karilyn Cota.

## 2013-09-19 ENCOUNTER — Ambulatory Visit (INDEPENDENT_AMBULATORY_CARE_PROVIDER_SITE_OTHER): Payer: Medicare Other | Admitting: Internal Medicine

## 2013-10-19 ENCOUNTER — Encounter (INDEPENDENT_AMBULATORY_CARE_PROVIDER_SITE_OTHER): Payer: Self-pay | Admitting: *Deleted

## 2013-12-12 DIAGNOSIS — M79609 Pain in unspecified limb: Secondary | ICD-10-CM | POA: Diagnosis not present

## 2013-12-12 DIAGNOSIS — M722 Plantar fascial fibromatosis: Secondary | ICD-10-CM | POA: Diagnosis not present

## 2014-01-02 DIAGNOSIS — M722 Plantar fascial fibromatosis: Secondary | ICD-10-CM | POA: Diagnosis not present

## 2014-01-02 DIAGNOSIS — M79609 Pain in unspecified limb: Secondary | ICD-10-CM | POA: Diagnosis not present

## 2014-01-02 DIAGNOSIS — G5760 Lesion of plantar nerve, unspecified lower limb: Secondary | ICD-10-CM | POA: Diagnosis not present

## 2014-01-26 ENCOUNTER — Encounter (INDEPENDENT_AMBULATORY_CARE_PROVIDER_SITE_OTHER): Payer: Self-pay | Admitting: Internal Medicine

## 2014-01-26 ENCOUNTER — Ambulatory Visit (INDEPENDENT_AMBULATORY_CARE_PROVIDER_SITE_OTHER): Payer: Medicare Other | Admitting: Internal Medicine

## 2014-01-26 VITALS — BP 142/58 | HR 76 | Temp 98.2°F | Ht 62.0 in | Wt 169.8 lb

## 2014-01-26 DIAGNOSIS — R7402 Elevation of levels of lactic acid dehydrogenase (LDH): Secondary | ICD-10-CM | POA: Diagnosis not present

## 2014-01-26 DIAGNOSIS — R740 Nonspecific elevation of levels of transaminase and lactic acid dehydrogenase [LDH]: Principal | ICD-10-CM

## 2014-01-26 DIAGNOSIS — R7401 Elevation of levels of liver transaminase levels: Secondary | ICD-10-CM | POA: Diagnosis not present

## 2014-01-26 NOTE — Patient Instructions (Signed)
OV in 1 yr. 

## 2014-01-26 NOTE — Progress Notes (Signed)
Subjective:     Patient ID: [name], female   DOB: 1945-12-23, 68 y.o.   MRN: [id number]  HPI  Here today for f/u of elevated transaminases.  She was last seen by Dr. Laural Golden in March of 2014. She is doing good.  She just lost her husband in October from a car wreck. She has a lot of gas. She occasionally sees blood when she wipes. Her appetite is good. No weight loss. No abdominal pain. BMs for the most part are normal.  She is not exercising since we have had rain all winter.  02/07/2013 US abdomen: No acute hepatic biliary findings.  Suggestion of mild fullness of the right internal testing system.  This may be within normal. Recommend clinical correlation.  Hepatic Function Panel     Component Value Date/Time   PROT 6.6 08/05/2013 1114   ALBUMIN 4.3 08/05/2013 1114   AST 15 08/05/2013 1114   ALT 13 08/05/2013 1114   ALKPHOS 76 08/05/2013 1114   BILITOT 1.2 08/05/2013 1114   BILIDIR 0.2 08/05/2013 1114   IBILI 1.0* 08/05/2013 1114  12/13/2012 AST 21, ALP 56, ALT 18 Hepatitis B and C markers were negative.  She has never been vacinated for Hepatitis A or B, Ferritin 32.    Last colonoscopy in 2013 for intermittent hemachezia and constipation. She had ileal and colonic erosions felt to secondary to NSAID therapy. Small polyp removed which was inflammatory. Sigmoid diverticulosis and external hermorrhoidds.  Subjective:  She has very good appetite. She denies nausea vomiting melena or rectal bleeding.  She does not smoke cigarettes or drink alcohol.       Review of Systems     Past Medical History  Diagnosis Date  . High cholesterol   . Hypertension   . Hypothyroid   . Depression   . Anxiety     Past Surgical History  Procedure Laterality Date  . Thyroidectomy    . Total abdominal hysterectomy    . Hemorrhoid surgery    . Tonsillectomy    . Bunionectomy    . Hammer toe repair    . Colonoscopy  06/18/2012    Procedure: COLONOSCOPY;  Surgeon: [name],  MD;  Location: [facility];  Service: Endoscopy;  Laterality: N/A;  730    Allergies  Allergen Reactions  . Penicillins Rash  . Sulfa Antibiotics Rash    Current Outpatient Prescriptions on File Prior to Visit  Medication Sig Dispense Refill  . calcium carbonate (OS-CAL) 600 MG TABS Take 600 mg by mouth 2 (two) times daily with a meal.      . cholecalciferol (VITAMIN D) 1000 UNITS tablet Take 1,000 Units by mouth daily.       . citalopram (CELEXA) 20 MG tablet Take 20 mg by mouth daily.      Marland Kitchen doxazosin (CARDURA) 2 MG tablet Take 2 mg by mouth at bedtime.      Marland Kitchen levothyroxine (SYNTHROID, LEVOTHROID) 75 MCG tablet Take 75 mcg by mouth daily.      Marland Kitchen MAGNESIUM GLYCINATE PLUS PO Take by mouth. 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 simvastatin (ZOCOR) 20 MG tablet Take 20 mg by mouth every evening.      . valsartan-hydrochlorothiazide (DIOVAN-HCT) 320-12.5 MG per tablet Take 1 tablet by mouth daily.       No current facility-administered medications on file prior to visit.  Objective:   Physical Exam  Filed Vitals:   01/26/14 1039  BP: 142/58  Pulse: 76  Temp: 98.2 F (36.8 C)  Height: 5\' 2"  (1.575 m)  Weight: 169 lb 12.8 oz (77.021 kg)  Alert and oriented. Skin warm and dry. Oral mucosa is moist.   . Sclera anicteric, conjunctivae is pink. Thyroid not enlarged. No cervical lymphadenopathy. Lungs clear. Heart regular rate and rhythm.  Abdomen is soft. Bowel sounds are positive. No hepatomegaly. No abdominal masses felt. No tenderness.  No edema to lower extremities.        Assessment:    Elevated transaminases. All have been normal. No stigmata of liver disease.     Plan:    OV in 1 yr. She will have her blood work sent from her PCP.

## 2014-02-14 DIAGNOSIS — H2510 Age-related nuclear cataract, unspecified eye: Secondary | ICD-10-CM | POA: Diagnosis not present

## 2014-02-14 DIAGNOSIS — H04129 Dry eye syndrome of unspecified lacrimal gland: Secondary | ICD-10-CM | POA: Diagnosis not present

## 2014-02-14 DIAGNOSIS — H538 Other visual disturbances: Secondary | ICD-10-CM | POA: Diagnosis not present

## 2014-02-16 DIAGNOSIS — I10 Essential (primary) hypertension: Secondary | ICD-10-CM | POA: Diagnosis not present

## 2014-02-16 DIAGNOSIS — M1990 Unspecified osteoarthritis, unspecified site: Secondary | ICD-10-CM | POA: Diagnosis not present

## 2014-02-16 DIAGNOSIS — F3289 Other specified depressive episodes: Secondary | ICD-10-CM | POA: Diagnosis not present

## 2014-02-16 DIAGNOSIS — F329 Major depressive disorder, single episode, unspecified: Secondary | ICD-10-CM | POA: Diagnosis not present

## 2014-03-30 DIAGNOSIS — M542 Cervicalgia: Secondary | ICD-10-CM | POA: Diagnosis not present

## 2014-03-30 DIAGNOSIS — L259 Unspecified contact dermatitis, unspecified cause: Secondary | ICD-10-CM | POA: Diagnosis not present

## 2014-04-26 DIAGNOSIS — X58XXXA Exposure to other specified factors, initial encounter: Secondary | ICD-10-CM | POA: Diagnosis not present

## 2014-04-26 DIAGNOSIS — E7800 Pure hypercholesterolemia, unspecified: Secondary | ICD-10-CM | POA: Diagnosis not present

## 2014-04-26 DIAGNOSIS — I10 Essential (primary) hypertension: Secondary | ICD-10-CM | POA: Diagnosis not present

## 2014-04-26 DIAGNOSIS — Z79899 Other long term (current) drug therapy: Secondary | ICD-10-CM | POA: Diagnosis not present

## 2014-04-26 DIAGNOSIS — IMO0002 Reserved for concepts with insufficient information to code with codable children: Secondary | ICD-10-CM | POA: Diagnosis not present

## 2014-04-26 DIAGNOSIS — E0590 Thyrotoxicosis, unspecified without thyrotoxic crisis or storm: Secondary | ICD-10-CM | POA: Diagnosis not present

## 2014-04-26 DIAGNOSIS — N390 Urinary tract infection, site not specified: Secondary | ICD-10-CM | POA: Diagnosis not present

## 2014-05-09 DIAGNOSIS — M129 Arthropathy, unspecified: Secondary | ICD-10-CM | POA: Diagnosis not present

## 2014-05-09 DIAGNOSIS — M25519 Pain in unspecified shoulder: Secondary | ICD-10-CM | POA: Diagnosis not present

## 2014-05-09 DIAGNOSIS — M542 Cervicalgia: Secondary | ICD-10-CM | POA: Diagnosis not present

## 2014-05-12 ENCOUNTER — Other Ambulatory Visit: Payer: Self-pay | Admitting: Orthopedic Surgery

## 2014-05-12 DIAGNOSIS — M542 Cervicalgia: Secondary | ICD-10-CM

## 2014-05-17 ENCOUNTER — Ambulatory Visit
Admission: RE | Admit: 2014-05-17 | Discharge: 2014-05-17 | Disposition: A | Discharge status: Home / Self Care | Payer: Medicare Other | Source: Ambulatory Visit | Attending: Orthopedic Surgery | Admitting: Orthopedic Surgery

## 2014-05-17 DIAGNOSIS — M4802 Spinal stenosis, cervical region: Secondary | ICD-10-CM | POA: Diagnosis not present

## 2014-05-17 DIAGNOSIS — M47812 Spondylosis without myelopathy or radiculopathy, cervical region: Secondary | ICD-10-CM | POA: Diagnosis not present

## 2014-05-17 DIAGNOSIS — M542 Cervicalgia: Secondary | ICD-10-CM

## 2014-05-24 DIAGNOSIS — R059 Cough, unspecified: Secondary | ICD-10-CM | POA: Diagnosis not present

## 2014-05-24 DIAGNOSIS — R05 Cough: Secondary | ICD-10-CM | POA: Diagnosis not present

## 2014-05-24 DIAGNOSIS — R5381 Other malaise: Secondary | ICD-10-CM | POA: Diagnosis not present

## 2014-05-24 DIAGNOSIS — R5383 Other fatigue: Secondary | ICD-10-CM | POA: Diagnosis not present

## 2014-05-29 DIAGNOSIS — R350 Frequency of micturition: Secondary | ICD-10-CM | POA: Diagnosis not present

## 2014-05-29 DIAGNOSIS — J310 Chronic rhinitis: Secondary | ICD-10-CM | POA: Diagnosis not present

## 2014-06-06 DIAGNOSIS — M542 Cervicalgia: Secondary | ICD-10-CM | POA: Diagnosis not present

## 2014-06-12 DIAGNOSIS — M542 Cervicalgia: Secondary | ICD-10-CM | POA: Diagnosis not present

## 2014-06-12 DIAGNOSIS — M2560 Stiffness of unspecified joint, not elsewhere classified: Secondary | ICD-10-CM | POA: Diagnosis not present

## 2014-06-12 DIAGNOSIS — M6281 Muscle weakness (generalized): Secondary | ICD-10-CM | POA: Diagnosis not present

## 2014-06-12 DIAGNOSIS — M5412 Radiculopathy, cervical region: Secondary | ICD-10-CM | POA: Diagnosis not present

## 2014-06-14 DIAGNOSIS — M2560 Stiffness of unspecified joint, not elsewhere classified: Secondary | ICD-10-CM | POA: Diagnosis not present

## 2014-06-14 DIAGNOSIS — M5412 Radiculopathy, cervical region: Secondary | ICD-10-CM | POA: Diagnosis not present

## 2014-06-14 DIAGNOSIS — M6281 Muscle weakness (generalized): Secondary | ICD-10-CM | POA: Diagnosis not present

## 2014-06-14 DIAGNOSIS — M542 Cervicalgia: Secondary | ICD-10-CM | POA: Diagnosis not present

## 2014-06-16 DIAGNOSIS — M542 Cervicalgia: Secondary | ICD-10-CM | POA: Diagnosis not present

## 2014-06-16 DIAGNOSIS — M2560 Stiffness of unspecified joint, not elsewhere classified: Secondary | ICD-10-CM | POA: Diagnosis not present

## 2014-06-16 DIAGNOSIS — M5412 Radiculopathy, cervical region: Secondary | ICD-10-CM | POA: Diagnosis not present

## 2014-06-16 DIAGNOSIS — M6281 Muscle weakness (generalized): Secondary | ICD-10-CM | POA: Diagnosis not present

## 2014-06-19 DIAGNOSIS — M5412 Radiculopathy, cervical region: Secondary | ICD-10-CM | POA: Diagnosis not present

## 2014-06-19 DIAGNOSIS — M2560 Stiffness of unspecified joint, not elsewhere classified: Secondary | ICD-10-CM | POA: Diagnosis not present

## 2014-06-19 DIAGNOSIS — M6281 Muscle weakness (generalized): Secondary | ICD-10-CM | POA: Diagnosis not present

## 2014-06-19 DIAGNOSIS — M542 Cervicalgia: Secondary | ICD-10-CM | POA: Diagnosis not present

## 2014-06-21 DIAGNOSIS — M2560 Stiffness of unspecified joint, not elsewhere classified: Secondary | ICD-10-CM | POA: Diagnosis not present

## 2014-06-21 DIAGNOSIS — M5412 Radiculopathy, cervical region: Secondary | ICD-10-CM | POA: Diagnosis not present

## 2014-06-21 DIAGNOSIS — M542 Cervicalgia: Secondary | ICD-10-CM | POA: Diagnosis not present

## 2014-06-21 DIAGNOSIS — M6281 Muscle weakness (generalized): Secondary | ICD-10-CM | POA: Diagnosis not present

## 2014-06-22 DIAGNOSIS — M6281 Muscle weakness (generalized): Secondary | ICD-10-CM | POA: Diagnosis not present

## 2014-06-22 DIAGNOSIS — M5412 Radiculopathy, cervical region: Secondary | ICD-10-CM | POA: Diagnosis not present

## 2014-06-22 DIAGNOSIS — M542 Cervicalgia: Secondary | ICD-10-CM | POA: Diagnosis not present

## 2014-06-22 DIAGNOSIS — M2560 Stiffness of unspecified joint, not elsewhere classified: Secondary | ICD-10-CM | POA: Diagnosis not present

## 2014-07-10 DIAGNOSIS — M542 Cervicalgia: Secondary | ICD-10-CM | POA: Diagnosis not present

## 2014-07-10 DIAGNOSIS — M5412 Radiculopathy, cervical region: Secondary | ICD-10-CM | POA: Diagnosis not present

## 2014-07-10 DIAGNOSIS — M6281 Muscle weakness (generalized): Secondary | ICD-10-CM | POA: Diagnosis not present

## 2014-07-10 DIAGNOSIS — M2560 Stiffness of unspecified joint, not elsewhere classified: Secondary | ICD-10-CM | POA: Diagnosis not present

## 2014-07-12 DIAGNOSIS — M2560 Stiffness of unspecified joint, not elsewhere classified: Secondary | ICD-10-CM | POA: Diagnosis not present

## 2014-07-12 DIAGNOSIS — M6281 Muscle weakness (generalized): Secondary | ICD-10-CM | POA: Diagnosis not present

## 2014-07-12 DIAGNOSIS — M542 Cervicalgia: Secondary | ICD-10-CM | POA: Diagnosis not present

## 2014-07-12 DIAGNOSIS — M5412 Radiculopathy, cervical region: Secondary | ICD-10-CM | POA: Diagnosis not present

## 2014-07-17 DIAGNOSIS — M6281 Muscle weakness (generalized): Secondary | ICD-10-CM | POA: Diagnosis not present

## 2014-07-17 DIAGNOSIS — M5412 Radiculopathy, cervical region: Secondary | ICD-10-CM | POA: Diagnosis not present

## 2014-07-17 DIAGNOSIS — M2560 Stiffness of unspecified joint, not elsewhere classified: Secondary | ICD-10-CM | POA: Diagnosis not present

## 2014-07-17 DIAGNOSIS — M542 Cervicalgia: Secondary | ICD-10-CM | POA: Diagnosis not present

## 2014-07-19 DIAGNOSIS — M542 Cervicalgia: Secondary | ICD-10-CM | POA: Diagnosis not present

## 2014-07-19 DIAGNOSIS — E2839 Other primary ovarian failure: Secondary | ICD-10-CM | POA: Diagnosis not present

## 2014-07-19 DIAGNOSIS — M5412 Radiculopathy, cervical region: Secondary | ICD-10-CM | POA: Diagnosis not present

## 2014-07-19 DIAGNOSIS — M2560 Stiffness of unspecified joint, not elsewhere classified: Secondary | ICD-10-CM | POA: Diagnosis not present

## 2014-07-19 DIAGNOSIS — M6281 Muscle weakness (generalized): Secondary | ICD-10-CM | POA: Diagnosis not present

## 2014-07-21 DIAGNOSIS — Z1231 Encounter for screening mammogram for malignant neoplasm of breast: Secondary | ICD-10-CM | POA: Diagnosis not present

## 2014-07-25 DIAGNOSIS — M545 Low back pain, unspecified: Secondary | ICD-10-CM | POA: Diagnosis not present

## 2014-07-25 DIAGNOSIS — R109 Unspecified abdominal pain: Secondary | ICD-10-CM | POA: Diagnosis not present

## 2014-07-25 DIAGNOSIS — R319 Hematuria, unspecified: Secondary | ICD-10-CM | POA: Diagnosis not present

## 2014-07-25 DIAGNOSIS — N1330 Unspecified hydronephrosis: Secondary | ICD-10-CM | POA: Diagnosis not present

## 2014-07-25 DIAGNOSIS — R350 Frequency of micturition: Secondary | ICD-10-CM | POA: Diagnosis not present

## 2014-08-10 DIAGNOSIS — N905 Atrophy of vulva: Secondary | ICD-10-CM | POA: Diagnosis not present

## 2014-08-10 DIAGNOSIS — R3915 Urgency of urination: Secondary | ICD-10-CM | POA: Diagnosis not present

## 2014-08-10 DIAGNOSIS — N1330 Unspecified hydronephrosis: Secondary | ICD-10-CM | POA: Diagnosis not present

## 2014-08-10 DIAGNOSIS — R310 Gross hematuria: Secondary | ICD-10-CM | POA: Diagnosis not present

## 2014-08-15 DIAGNOSIS — M542 Cervicalgia: Secondary | ICD-10-CM | POA: Diagnosis not present

## 2014-08-17 DIAGNOSIS — N1330 Unspecified hydronephrosis: Secondary | ICD-10-CM | POA: Diagnosis not present

## 2014-08-17 DIAGNOSIS — Z0189 Encounter for other specified special examinations: Secondary | ICD-10-CM | POA: Diagnosis not present

## 2014-09-05 DIAGNOSIS — Z23 Encounter for immunization: Secondary | ICD-10-CM | POA: Diagnosis not present

## 2014-09-08 DIAGNOSIS — Z0000 Encounter for general adult medical examination without abnormal findings: Secondary | ICD-10-CM | POA: Diagnosis not present

## 2014-09-08 DIAGNOSIS — R5383 Other fatigue: Secondary | ICD-10-CM | POA: Diagnosis not present

## 2014-09-08 DIAGNOSIS — E780 Pure hypercholesterolemia: Secondary | ICD-10-CM | POA: Diagnosis not present

## 2014-09-08 DIAGNOSIS — I10 Essential (primary) hypertension: Secondary | ICD-10-CM | POA: Diagnosis not present

## 2014-09-08 DIAGNOSIS — E039 Hypothyroidism, unspecified: Secondary | ICD-10-CM | POA: Diagnosis not present

## 2014-11-01 ENCOUNTER — Encounter (INDEPENDENT_AMBULATORY_CARE_PROVIDER_SITE_OTHER): Payer: Self-pay | Admitting: *Deleted

## 2014-11-10 ENCOUNTER — Encounter (INDEPENDENT_AMBULATORY_CARE_PROVIDER_SITE_OTHER): Payer: Self-pay

## 2015-01-10 DIAGNOSIS — I10 Essential (primary) hypertension: Secondary | ICD-10-CM | POA: Diagnosis not present

## 2015-01-10 DIAGNOSIS — H6980 Other specified disorders of Eustachian tube, unspecified ear: Secondary | ICD-10-CM | POA: Diagnosis not present

## 2015-01-10 DIAGNOSIS — J069 Acute upper respiratory infection, unspecified: Secondary | ICD-10-CM | POA: Diagnosis not present

## 2015-01-10 DIAGNOSIS — E668 Other obesity: Secondary | ICD-10-CM | POA: Diagnosis not present

## 2015-01-10 DIAGNOSIS — Z789 Other specified health status: Secondary | ICD-10-CM | POA: Diagnosis not present

## 2015-03-20 ENCOUNTER — Ambulatory Visit (INDEPENDENT_AMBULATORY_CARE_PROVIDER_SITE_OTHER): Payer: Medicare Other | Admitting: Internal Medicine

## 2015-03-20 DIAGNOSIS — Z6830 Body mass index (BMI) 30.0-30.9, adult: Secondary | ICD-10-CM | POA: Diagnosis not present

## 2015-03-20 DIAGNOSIS — R5383 Other fatigue: Secondary | ICD-10-CM | POA: Diagnosis not present

## 2015-03-20 DIAGNOSIS — E039 Hypothyroidism, unspecified: Secondary | ICD-10-CM | POA: Diagnosis not present

## 2015-03-20 DIAGNOSIS — I10 Essential (primary) hypertension: Secondary | ICD-10-CM | POA: Diagnosis not present

## 2015-03-20 DIAGNOSIS — Z1389 Encounter for screening for other disorder: Secondary | ICD-10-CM | POA: Diagnosis not present

## 2015-03-20 DIAGNOSIS — E668 Other obesity: Secondary | ICD-10-CM | POA: Diagnosis not present

## 2015-03-20 DIAGNOSIS — F329 Major depressive disorder, single episode, unspecified: Secondary | ICD-10-CM | POA: Diagnosis not present

## 2015-04-03 ENCOUNTER — Ambulatory Visit (INDEPENDENT_AMBULATORY_CARE_PROVIDER_SITE_OTHER): Payer: Medicare Other | Admitting: Internal Medicine

## 2015-04-03 ENCOUNTER — Encounter (INDEPENDENT_AMBULATORY_CARE_PROVIDER_SITE_OTHER): Payer: Self-pay | Admitting: Internal Medicine

## 2015-04-03 VITALS — BP 130/82 | HR 70 | Temp 97.5°F | Resp 18 | Ht 62.0 in | Wt 173.7 lb

## 2015-04-03 DIAGNOSIS — R194 Change in bowel habit: Secondary | ICD-10-CM | POA: Diagnosis not present

## 2015-04-03 NOTE — Patient Instructions (Signed)
Probiotic 1 capsule daily for 4 weeks. Call if diarrhea gets worse or you have fever.

## 2015-04-03 NOTE — Progress Notes (Signed)
Presenting complaint;  Change in bowel habits.  Subjective:  Patient is 69 year old Caucasian female was here for scheduled visit. She believes is it is time for undergo colonoscopy. She reports change in her bowel habits which occurred 10-12 days ago. She used to have 1 formed stool daily. Now she's having loose watery stool. She denies abdominal pain melena urgency fever or chills. She has occasional rectal bleeding with her bowel movements described to be scant amount. Patient states she was treated with Levaquin and then Cipro for URI in February 2016. She remains with good appetite. She has gained 4 pounds in the last visit. She does not walk regularly but does daily exercise for muscle strengthening. She states she had blood work last year but not this year.   Current Medications: Outpatient Encounter Prescriptions as of 04/03/2015  Medication Sig  . ALPRAZolam (XANAX) 0.25 MG tablet Take 0.25 mg by mouth at bedtime as needed.   . calcium carbonate (OS-CAL) 600 MG TABS Take 600 mg by mouth 2 (two) times daily with a meal.  . cholecalciferol (VITAMIN D) 1000 UNITS tablet Take 2,000 Units by mouth daily.   . citalopram (CELEXA) 20 MG tablet Take 20 mg by mouth daily.  Marland Kitchen doxazosin (CARDURA) 2 MG tablet Take 2 mg by mouth at bedtime.  Marland Kitchen levothyroxine (SYNTHROID, LEVOTHROID) 75 MCG tablet Take 75 mcg by mouth daily.  Marland Kitchen MAGNESIUM GLYCINATE PLUS PO Take by mouth. Patient states that she takes 1-2 daily - This is a herbal form Hydrographic surveyor  . simvastatin (ZOCOR) 20 MG tablet Take 20 mg by mouth every evening.  . valsartan-hydrochlorothiazide (DIOVAN-HCT) 320-12.5 MG per tablet Take 1 tablet by mouth daily.   No facility-administered encounter medications on file as of 04/03/2015.     Objective: Blood pressure 130/82, pulse 70, temperature 97.5 F (36.4 C), temperature source Oral, resp. rate 18, height 5\' 2"  (1.575 m), weight 173 lb 11.2 oz (78.79 kg). Patient is alert  and in no acute distress. Conjunctiva is pink. Sclera is nonicteric Oropharyngeal mucosa is normal. No neck masses or thyromegaly noted. Cardiac exam with regular rhythm normal S1 and S2. No murmur or gallop noted. Lungs are clear to auscultation. Abdomen is symmetrical soft and nontender without organomegaly or masses.  No LE edema or clubbing noted.   Assessment:  #1. Recent change in her bowel habits with mild diarrhea without constitutional symptoms most likely related to 2 course of antibiotic that she took in February this year. If symptoms get worse or she develops fever she will need further evaluation. #2. History of mildly elevated transaminases back in 2013. Since then her transaminases have been normal.   Plan:  Probiotic 1 capsule by mouth daily for 4 weeks. Patient will call if diarrhea gets worse or she has fever. She will call with progress report in one week. Office visit in one year

## 2015-04-17 DIAGNOSIS — H04123 Dry eye syndrome of bilateral lacrimal glands: Secondary | ICD-10-CM | POA: Diagnosis not present

## 2015-07-10 DIAGNOSIS — M1712 Unilateral primary osteoarthritis, left knee: Secondary | ICD-10-CM | POA: Diagnosis not present

## 2015-07-10 DIAGNOSIS — M5441 Lumbago with sciatica, right side: Secondary | ICD-10-CM | POA: Diagnosis not present

## 2015-07-10 DIAGNOSIS — M1711 Unilateral primary osteoarthritis, right knee: Secondary | ICD-10-CM | POA: Diagnosis not present

## 2015-07-10 DIAGNOSIS — M170 Bilateral primary osteoarthritis of knee: Secondary | ICD-10-CM | POA: Diagnosis not present

## 2015-07-17 DIAGNOSIS — H40033 Anatomical narrow angle, bilateral: Secondary | ICD-10-CM | POA: Diagnosis not present

## 2015-07-17 DIAGNOSIS — H04123 Dry eye syndrome of bilateral lacrimal glands: Secondary | ICD-10-CM | POA: Diagnosis not present

## 2015-07-23 DIAGNOSIS — Z1231 Encounter for screening mammogram for malignant neoplasm of breast: Secondary | ICD-10-CM | POA: Diagnosis not present

## 2015-08-08 DIAGNOSIS — M170 Bilateral primary osteoarthritis of knee: Secondary | ICD-10-CM | POA: Diagnosis not present

## 2015-08-16 DIAGNOSIS — G8929 Other chronic pain: Secondary | ICD-10-CM | POA: Diagnosis not present

## 2015-08-16 DIAGNOSIS — M5441 Lumbago with sciatica, right side: Secondary | ICD-10-CM | POA: Diagnosis not present

## 2015-08-17 IMAGING — CT CT CERVICAL SPINE W/O CM
3 of 5 series · 15 of 33 positions shown, 18 images · non-contrast
Comparison: 04/08/2010.

CLINICAL DATA: Cervicalgia. RIGHT arm tingling and pain. Spinal
fusion 4 years ago.

EXAM:
CT CERVICAL SPINE WITHOUT CONTRAST
TECHNIQUE: Multidetector CT imaging of the cervical spine was performed without
intravenous contrast. Multiplanar CT image reconstructions were also
generated.

[Series 201: sag · sagittal · 0.36mm/px · 5 of 42 slices shown, 6 images]
[im 14/42  bone]
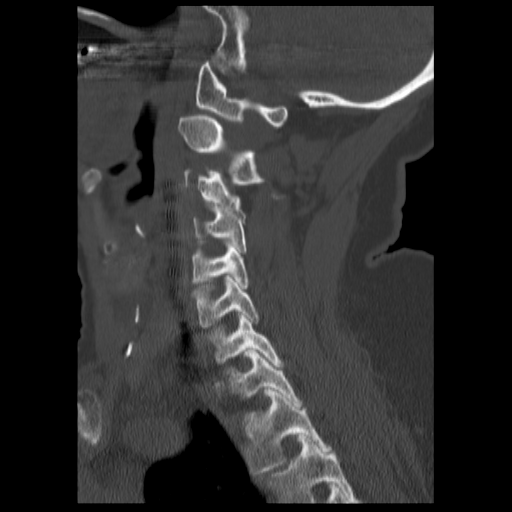
[im 18/42  bone]
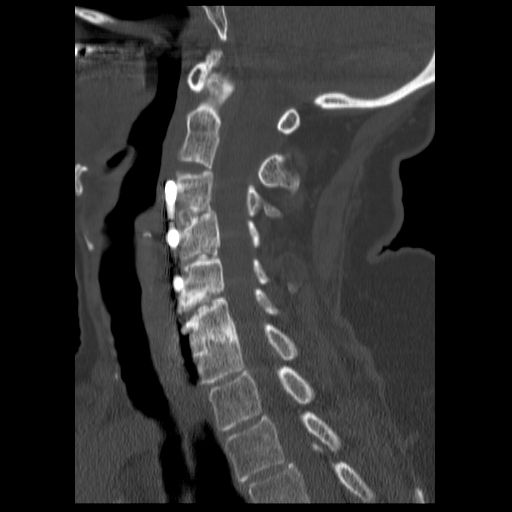
[im 21/42  soft-tissue]
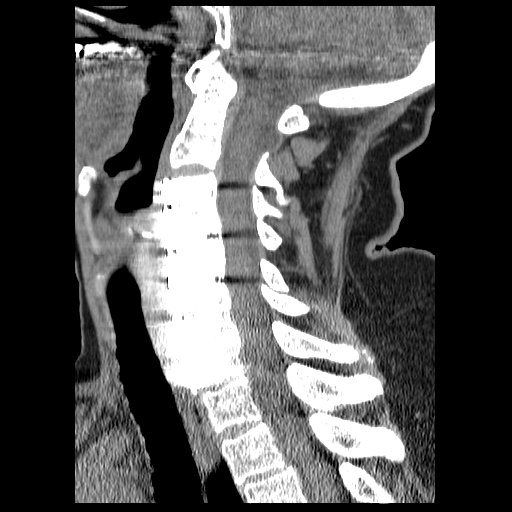
[im 21/42  bone]
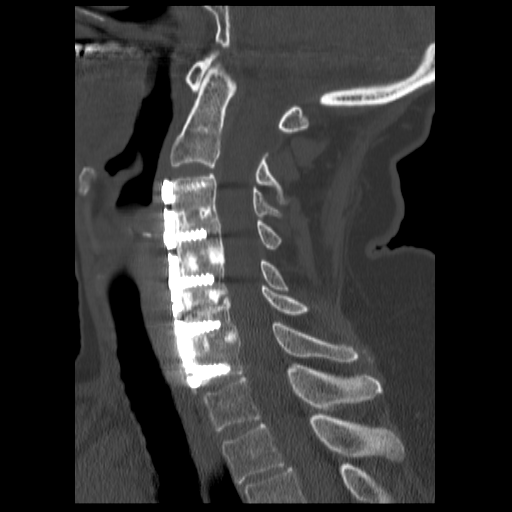
[im 24/42  bone]
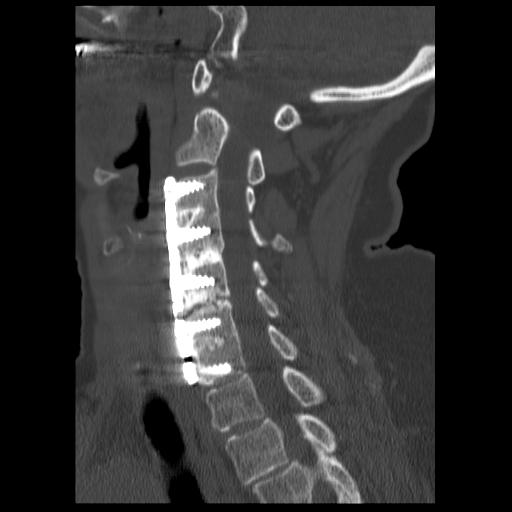
[im 28/42  bone]
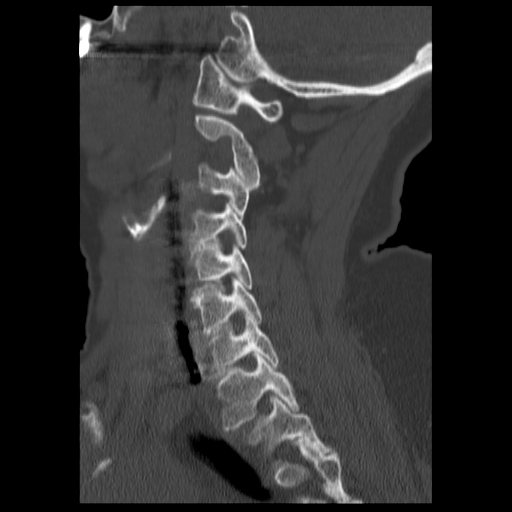

[Series 202: cor · coronal · 0.36mm/px · 3 of 48 slices shown]
[im 10/48  bone]
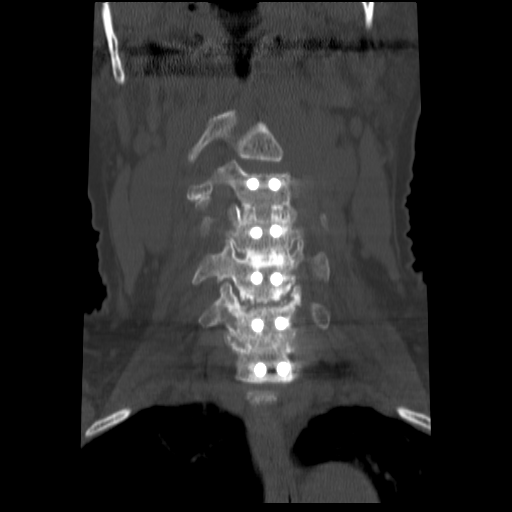
[im 19/48  bone]
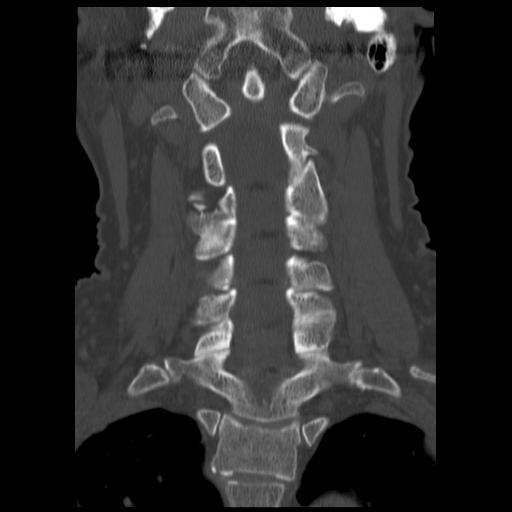
[im 29/48  bone]
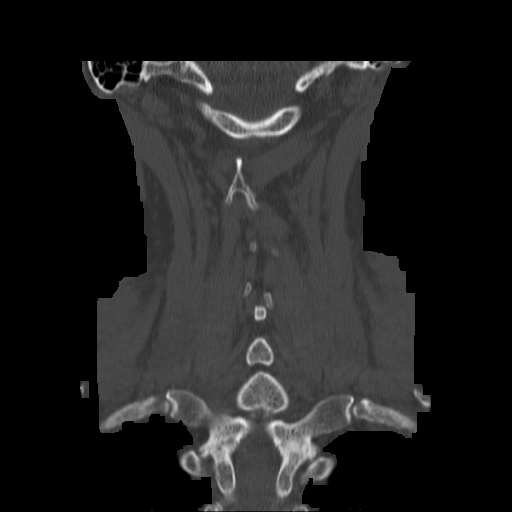

[Series 203: angled axial · axial · 0.20mm/px · z∈[-12,+118]mm · 7 of 251 slices shown, 9 images]
[im 32/251  soft-tissue]
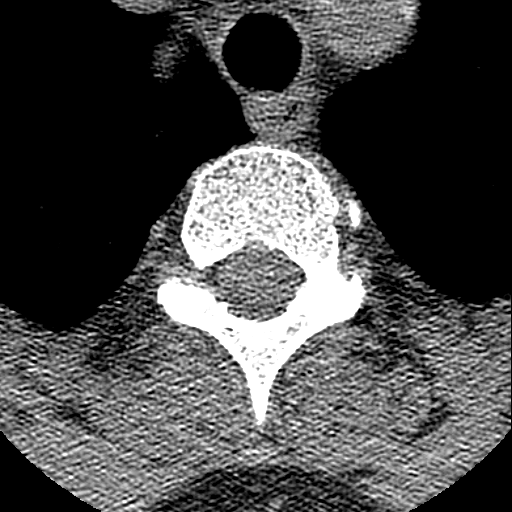
[im 32/251  bone]
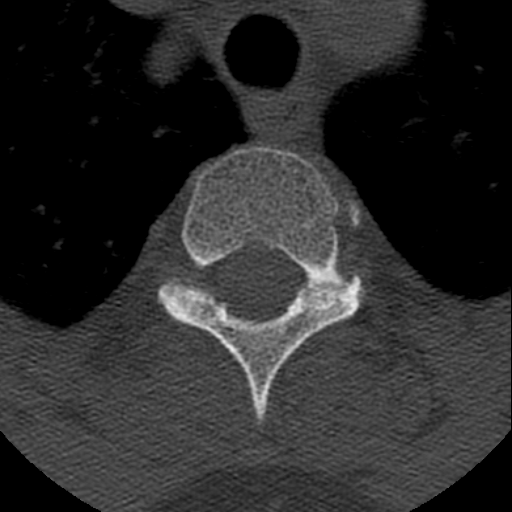
[im 63/251  bone]
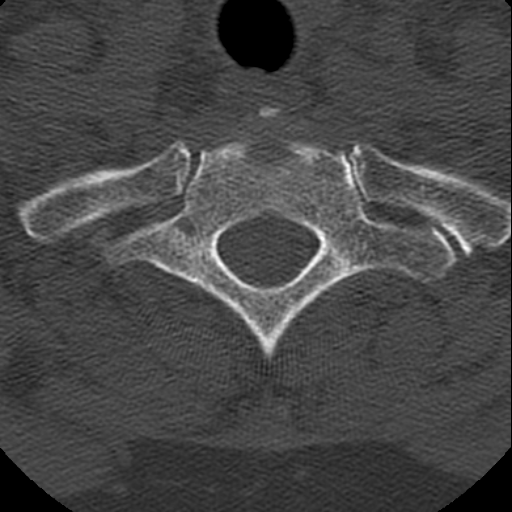
[im 94/251  bone]
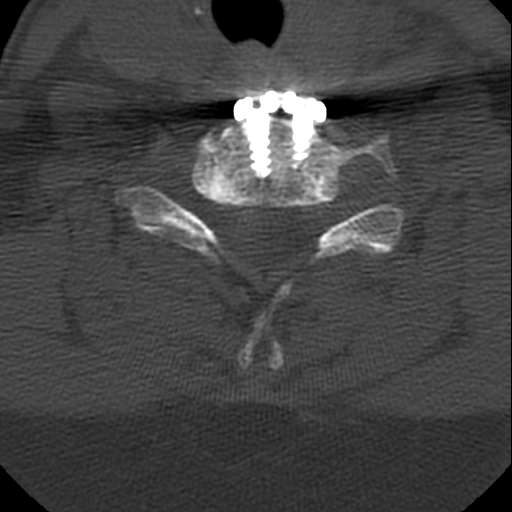
[im 126/251  bone]
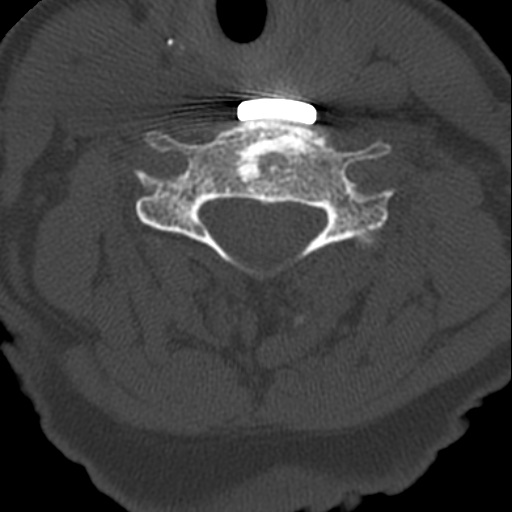
[im 157/251  soft-tissue]
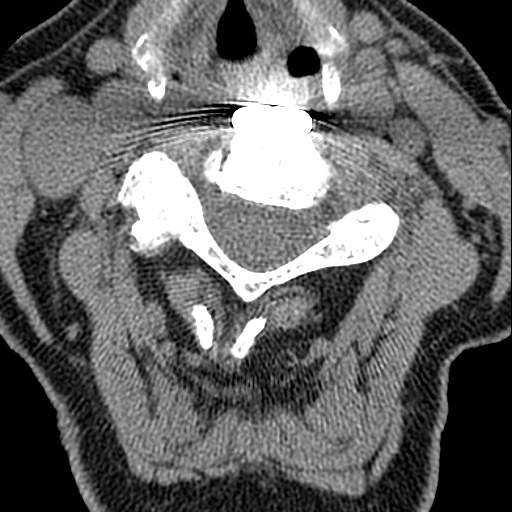
[im 157/251  bone]
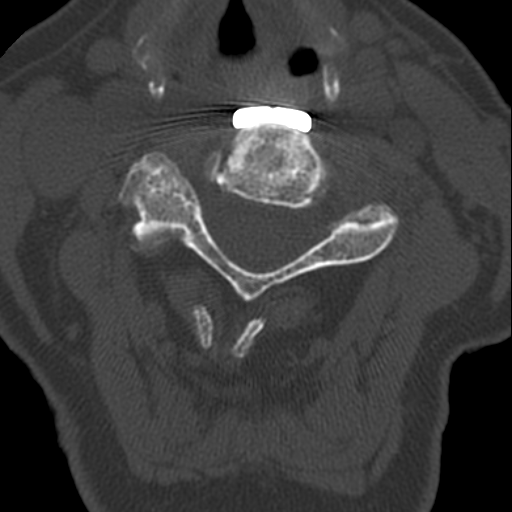
[im 188/251  bone]
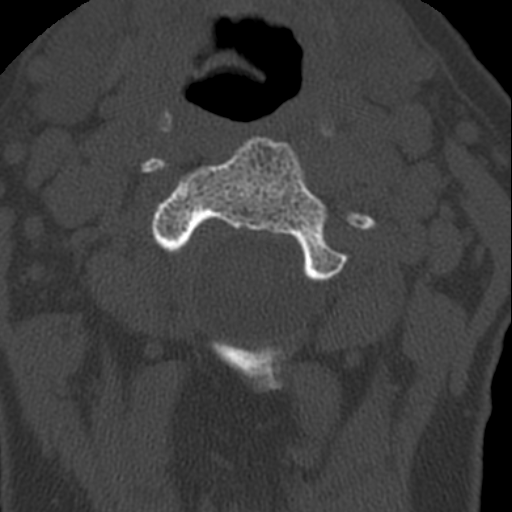
[im 219/251  bone]
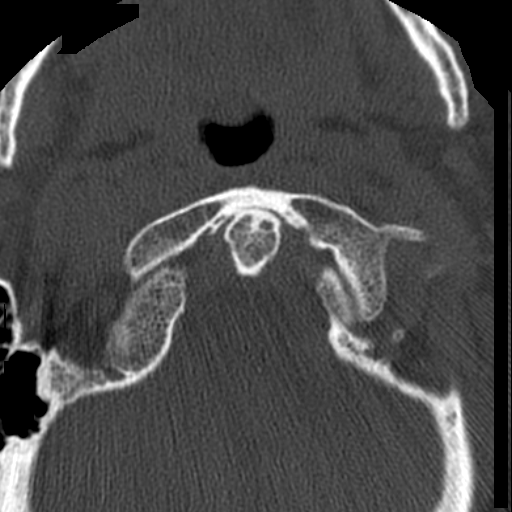

[15 of 33 positions shown; findings below may reference images not displayed]

FINDINGS: ACDF is present from C3 through C7. There is solid fusion at C3-C4,
C4-C5 and C6-C7. There is no solid fusion at C5-C6. There is no
loosening of the C5 or C6 screws. The interbody bone graft has
incorporated into the inferior aspect of the C5 vertebra however
there is no bridging bone and cortication is present around the disc
space. The alignment on the sagittal plane is within normal limits.
Upper chest demonstrates aortic atherosclerosis.

C3-C4: Severe RIGHT C3-C4 facet arthrosis is present, with RIGHT
foraminal stenosis that potentially affects the RIGHT C4 nerve. The
LEFT neural foramen is patent. Central canal is patent. Disc height
is preserved.

C3-C4: Mild RIGHT foraminal stenosis due to uncovertebral spurring
and facet hypertrophy of doubtful significance in the setting of
fusion.

C4-C5: Mild LEFT foraminal stenosis secondary to residual
uncovertebral spurring. Central canal and RIGHT foramen patent.
Again, significance is doubtful.

C5-C6: Left-greater-than-right uncovertebral spurring with
left-greater-than-right foraminal stenosis potentially affecting
both C6 nerves. Central canal patent.

C6-C7:  Solid fusion.  No stenosis.

C7-T1:  Mild disc degeneration.  No stenosis.

T1-T2 appears normal. T2-T3 shows 2 mm of anterolisthesis associated
with facet arthrosis.
IMPRESSION: 1. C3 through C7 ACDF plate. No hardware loosening. Solid fusion at
C3 through C5 and C6-C7 with nonunion at C5-C6.
2. Left-greater-than-right foraminal stenosis at C5-C6 due to
uncovertebral spurring potentially affecting left-greater-than-right
C6 nerves.
3. Unchanged severe RIGHT C2-C3 facet arthrosis with RIGHT foraminal
stenosis.

## 2015-08-24 DIAGNOSIS — M5136 Other intervertebral disc degeneration, lumbar region: Secondary | ICD-10-CM | POA: Diagnosis not present

## 2015-08-24 DIAGNOSIS — M5441 Lumbago with sciatica, right side: Secondary | ICD-10-CM | POA: Diagnosis not present

## 2015-09-07 DIAGNOSIS — M4726 Other spondylosis with radiculopathy, lumbar region: Secondary | ICD-10-CM | POA: Diagnosis not present

## 2015-09-07 DIAGNOSIS — M545 Low back pain: Secondary | ICD-10-CM | POA: Diagnosis not present

## 2015-09-12 DIAGNOSIS — M170 Bilateral primary osteoarthritis of knee: Secondary | ICD-10-CM | POA: Diagnosis not present

## 2015-09-12 DIAGNOSIS — M1712 Unilateral primary osteoarthritis, left knee: Secondary | ICD-10-CM | POA: Diagnosis not present

## 2015-09-12 DIAGNOSIS — M5136 Other intervertebral disc degeneration, lumbar region: Secondary | ICD-10-CM | POA: Diagnosis not present

## 2015-09-12 DIAGNOSIS — M5441 Lumbago with sciatica, right side: Secondary | ICD-10-CM | POA: Diagnosis not present

## 2015-09-17 DIAGNOSIS — M170 Bilateral primary osteoarthritis of knee: Secondary | ICD-10-CM | POA: Diagnosis not present

## 2015-09-17 DIAGNOSIS — M5441 Lumbago with sciatica, right side: Secondary | ICD-10-CM | POA: Diagnosis not present

## 2015-09-17 DIAGNOSIS — M25561 Pain in right knee: Secondary | ICD-10-CM | POA: Diagnosis not present

## 2015-09-17 DIAGNOSIS — M5136 Other intervertebral disc degeneration, lumbar region: Secondary | ICD-10-CM | POA: Diagnosis not present

## 2015-09-24 DIAGNOSIS — M5136 Other intervertebral disc degeneration, lumbar region: Secondary | ICD-10-CM | POA: Diagnosis not present

## 2015-09-24 DIAGNOSIS — M5416 Radiculopathy, lumbar region: Secondary | ICD-10-CM | POA: Diagnosis not present

## 2015-09-25 DIAGNOSIS — Z23 Encounter for immunization: Secondary | ICD-10-CM | POA: Diagnosis not present

## 2015-10-02 DIAGNOSIS — M5416 Radiculopathy, lumbar region: Secondary | ICD-10-CM | POA: Diagnosis not present

## 2015-10-11 DIAGNOSIS — Z7189 Other specified counseling: Secondary | ICD-10-CM | POA: Diagnosis not present

## 2015-10-11 DIAGNOSIS — Z1389 Encounter for screening for other disorder: Secondary | ICD-10-CM | POA: Diagnosis not present

## 2015-10-11 DIAGNOSIS — Z418 Encounter for other procedures for purposes other than remedying health state: Secondary | ICD-10-CM | POA: Diagnosis not present

## 2015-10-11 DIAGNOSIS — E7800 Pure hypercholesterolemia, unspecified: Secondary | ICD-10-CM | POA: Diagnosis not present

## 2015-10-11 DIAGNOSIS — Z0000 Encounter for general adult medical examination without abnormal findings: Secondary | ICD-10-CM | POA: Diagnosis not present

## 2015-10-11 DIAGNOSIS — Z1211 Encounter for screening for malignant neoplasm of colon: Secondary | ICD-10-CM | POA: Diagnosis not present

## 2015-10-11 DIAGNOSIS — E039 Hypothyroidism, unspecified: Secondary | ICD-10-CM | POA: Diagnosis not present

## 2015-10-11 DIAGNOSIS — Z79899 Other long term (current) drug therapy: Secondary | ICD-10-CM | POA: Diagnosis not present

## 2015-10-12 DIAGNOSIS — Z79899 Other long term (current) drug therapy: Secondary | ICD-10-CM | POA: Diagnosis not present

## 2015-10-12 DIAGNOSIS — E7800 Pure hypercholesterolemia, unspecified: Secondary | ICD-10-CM | POA: Diagnosis not present

## 2015-10-12 DIAGNOSIS — E039 Hypothyroidism, unspecified: Secondary | ICD-10-CM | POA: Diagnosis not present

## 2015-10-23 DIAGNOSIS — M5136 Other intervertebral disc degeneration, lumbar region: Secondary | ICD-10-CM | POA: Diagnosis not present

## 2015-10-23 DIAGNOSIS — M5416 Radiculopathy, lumbar region: Secondary | ICD-10-CM | POA: Diagnosis not present

## 2015-11-27 DIAGNOSIS — H578 Other specified disorders of eye and adnexa: Secondary | ICD-10-CM | POA: Diagnosis not present

## 2015-12-10 DIAGNOSIS — H578 Other specified disorders of eye and adnexa: Secondary | ICD-10-CM | POA: Diagnosis not present

## 2015-12-13 DIAGNOSIS — H04322 Acute dacryocystitis of left lacrimal passage: Secondary | ICD-10-CM | POA: Diagnosis not present

## 2016-03-11 DIAGNOSIS — E039 Hypothyroidism, unspecified: Secondary | ICD-10-CM | POA: Diagnosis not present

## 2016-03-11 DIAGNOSIS — I10 Essential (primary) hypertension: Secondary | ICD-10-CM | POA: Diagnosis not present

## 2016-03-28 DIAGNOSIS — M5416 Radiculopathy, lumbar region: Secondary | ICD-10-CM | POA: Diagnosis not present

## 2016-03-28 DIAGNOSIS — M170 Bilateral primary osteoarthritis of knee: Secondary | ICD-10-CM | POA: Diagnosis not present

## 2016-03-28 DIAGNOSIS — M5441 Lumbago with sciatica, right side: Secondary | ICD-10-CM | POA: Diagnosis not present

## 2016-05-20 DIAGNOSIS — I10 Essential (primary) hypertension: Secondary | ICD-10-CM | POA: Diagnosis not present

## 2016-05-20 DIAGNOSIS — E7800 Pure hypercholesterolemia, unspecified: Secondary | ICD-10-CM | POA: Diagnosis not present

## 2016-05-20 DIAGNOSIS — M159 Polyosteoarthritis, unspecified: Secondary | ICD-10-CM | POA: Diagnosis not present

## 2016-06-25 DIAGNOSIS — H35372 Puckering of macula, left eye: Secondary | ICD-10-CM | POA: Diagnosis not present

## 2016-06-25 DIAGNOSIS — H538 Other visual disturbances: Secondary | ICD-10-CM | POA: Diagnosis not present

## 2016-06-25 DIAGNOSIS — H2513 Age-related nuclear cataract, bilateral: Secondary | ICD-10-CM | POA: Diagnosis not present

## 2016-06-26 DIAGNOSIS — H43393 Other vitreous opacities, bilateral: Secondary | ICD-10-CM | POA: Diagnosis not present

## 2016-06-26 DIAGNOSIS — H40033 Anatomical narrow angle, bilateral: Secondary | ICD-10-CM | POA: Diagnosis not present

## 2016-06-28 DIAGNOSIS — M170 Bilateral primary osteoarthritis of knee: Secondary | ICD-10-CM | POA: Diagnosis not present

## 2016-07-17 DIAGNOSIS — M1712 Unilateral primary osteoarthritis, left knee: Secondary | ICD-10-CM | POA: Diagnosis not present

## 2016-07-17 DIAGNOSIS — M1711 Unilateral primary osteoarthritis, right knee: Secondary | ICD-10-CM | POA: Diagnosis not present

## 2016-07-23 DIAGNOSIS — R350 Frequency of micturition: Secondary | ICD-10-CM | POA: Diagnosis not present

## 2016-07-23 DIAGNOSIS — N390 Urinary tract infection, site not specified: Secondary | ICD-10-CM | POA: Diagnosis not present

## 2016-07-23 DIAGNOSIS — I10 Essential (primary) hypertension: Secondary | ICD-10-CM | POA: Diagnosis not present

## 2016-07-24 DIAGNOSIS — M170 Bilateral primary osteoarthritis of knee: Secondary | ICD-10-CM | POA: Diagnosis not present

## 2016-07-25 DIAGNOSIS — Z1231 Encounter for screening mammogram for malignant neoplasm of breast: Secondary | ICD-10-CM | POA: Diagnosis not present

## 2016-07-31 DIAGNOSIS — M5136 Other intervertebral disc degeneration, lumbar region: Secondary | ICD-10-CM | POA: Diagnosis not present

## 2016-07-31 DIAGNOSIS — M170 Bilateral primary osteoarthritis of knee: Secondary | ICD-10-CM | POA: Diagnosis not present

## 2016-07-31 DIAGNOSIS — M47816 Spondylosis without myelopathy or radiculopathy, lumbar region: Secondary | ICD-10-CM | POA: Diagnosis not present

## 2016-07-31 DIAGNOSIS — M542 Cervicalgia: Secondary | ICD-10-CM | POA: Diagnosis not present

## 2016-08-02 DIAGNOSIS — Z23 Encounter for immunization: Secondary | ICD-10-CM | POA: Diagnosis not present

## 2016-08-12 DIAGNOSIS — Z789 Other specified health status: Secondary | ICD-10-CM | POA: Diagnosis not present

## 2016-08-12 DIAGNOSIS — Z6827 Body mass index (BMI) 27.0-27.9, adult: Secondary | ICD-10-CM | POA: Diagnosis not present

## 2016-08-12 DIAGNOSIS — L570 Actinic keratosis: Secondary | ICD-10-CM | POA: Diagnosis not present

## 2016-08-20 DIAGNOSIS — M47816 Spondylosis without myelopathy or radiculopathy, lumbar region: Secondary | ICD-10-CM | POA: Diagnosis not present

## 2016-09-21 DIAGNOSIS — M25519 Pain in unspecified shoulder: Secondary | ICD-10-CM | POA: Diagnosis not present

## 2016-09-21 DIAGNOSIS — S199XXA Unspecified injury of neck, initial encounter: Secondary | ICD-10-CM | POA: Diagnosis not present

## 2016-09-21 DIAGNOSIS — M25512 Pain in left shoulder: Secondary | ICD-10-CM | POA: Diagnosis not present

## 2016-09-21 DIAGNOSIS — M7581 Other shoulder lesions, right shoulder: Secondary | ICD-10-CM | POA: Diagnosis not present

## 2016-09-21 DIAGNOSIS — M542 Cervicalgia: Secondary | ICD-10-CM | POA: Diagnosis not present

## 2016-09-21 DIAGNOSIS — S4992XA Unspecified injury of left shoulder and upper arm, initial encounter: Secondary | ICD-10-CM | POA: Diagnosis not present

## 2016-09-24 DIAGNOSIS — M25511 Pain in right shoulder: Secondary | ICD-10-CM | POA: Diagnosis not present

## 2016-09-29 DIAGNOSIS — M19012 Primary osteoarthritis, left shoulder: Secondary | ICD-10-CM | POA: Diagnosis not present

## 2016-10-07 DIAGNOSIS — M19012 Primary osteoarthritis, left shoulder: Secondary | ICD-10-CM | POA: Diagnosis not present

## 2016-10-09 DIAGNOSIS — M19012 Primary osteoarthritis, left shoulder: Secondary | ICD-10-CM | POA: Diagnosis not present

## 2016-10-30 DIAGNOSIS — E2839 Other primary ovarian failure: Secondary | ICD-10-CM | POA: Diagnosis not present

## 2016-11-04 DIAGNOSIS — Z299 Encounter for prophylactic measures, unspecified: Secondary | ICD-10-CM | POA: Diagnosis not present

## 2016-11-04 DIAGNOSIS — J069 Acute upper respiratory infection, unspecified: Secondary | ICD-10-CM | POA: Diagnosis not present

## 2016-12-01 DIAGNOSIS — Z1389 Encounter for screening for other disorder: Secondary | ICD-10-CM | POA: Diagnosis not present

## 2016-12-01 DIAGNOSIS — E7800 Pure hypercholesterolemia, unspecified: Secondary | ICD-10-CM | POA: Diagnosis not present

## 2016-12-01 DIAGNOSIS — Z299 Encounter for prophylactic measures, unspecified: Secondary | ICD-10-CM | POA: Diagnosis not present

## 2016-12-01 DIAGNOSIS — E559 Vitamin D deficiency, unspecified: Secondary | ICD-10-CM | POA: Diagnosis not present

## 2016-12-01 DIAGNOSIS — Z1211 Encounter for screening for malignant neoplasm of colon: Secondary | ICD-10-CM | POA: Diagnosis not present

## 2016-12-01 DIAGNOSIS — Z79899 Other long term (current) drug therapy: Secondary | ICD-10-CM | POA: Diagnosis not present

## 2016-12-01 DIAGNOSIS — R5383 Other fatigue: Secondary | ICD-10-CM | POA: Diagnosis not present

## 2016-12-01 DIAGNOSIS — Z7189 Other specified counseling: Secondary | ICD-10-CM | POA: Diagnosis not present

## 2016-12-01 DIAGNOSIS — Z0000 Encounter for general adult medical examination without abnormal findings: Secondary | ICD-10-CM | POA: Diagnosis not present

## 2016-12-02 ENCOUNTER — Telehealth (INDEPENDENT_AMBULATORY_CARE_PROVIDER_SITE_OTHER): Payer: Self-pay | Admitting: *Deleted

## 2016-12-02 NOTE — Telephone Encounter (Signed)
Past TCS was 05/2012, please advise when patient is due for repeat TCS

## 2016-12-02 NOTE — Telephone Encounter (Signed)
On her last exam of July 2013 she had one small polyp removed and was not tubular adenoma. Therefore next colonoscopy would be in 2013 unless family history has changed.

## 2016-12-02 NOTE — Telephone Encounter (Signed)
Per Dr Laural Golden next TCS should be 05/2022 not 05/2012, Fraser Din at [facility] is aware as they sent a referral for screening TCS, 10 yr TCS is note din recall  For patient

## 2016-12-16 DIAGNOSIS — M19012 Primary osteoarthritis, left shoulder: Secondary | ICD-10-CM | POA: Diagnosis not present

## 2016-12-22 DIAGNOSIS — E7800 Pure hypercholesterolemia, unspecified: Secondary | ICD-10-CM | POA: Diagnosis not present

## 2016-12-22 DIAGNOSIS — M159 Polyosteoarthritis, unspecified: Secondary | ICD-10-CM | POA: Diagnosis not present

## 2016-12-22 DIAGNOSIS — I10 Essential (primary) hypertension: Secondary | ICD-10-CM | POA: Diagnosis not present

## 2017-01-06 DIAGNOSIS — E7800 Pure hypercholesterolemia, unspecified: Secondary | ICD-10-CM | POA: Diagnosis not present

## 2017-01-06 DIAGNOSIS — M159 Polyosteoarthritis, unspecified: Secondary | ICD-10-CM | POA: Diagnosis not present

## 2017-01-06 DIAGNOSIS — I10 Essential (primary) hypertension: Secondary | ICD-10-CM | POA: Diagnosis not present

## 2017-01-14 DIAGNOSIS — H2513 Age-related nuclear cataract, bilateral: Secondary | ICD-10-CM | POA: Diagnosis not present

## 2017-01-14 DIAGNOSIS — H40033 Anatomical narrow angle, bilateral: Secondary | ICD-10-CM | POA: Diagnosis not present

## 2017-01-22 NOTE — H&P (Signed)
  [name] is an 71 y.o. female.    Chief Complaint: left shoulder pain  HPI: Pt is a 71 y.o. female complaining of left shoulder pain for multiple years. Pain had continually increased since the beginning. X-rays in the clinic show end-stage arthritic changes of the left shoulder. Pt has tried various conservative treatments which have failed to alleviate their symptoms, including injections and therapy. Various options are discussed with the patient. Risks, benefits and expectations were discussed with the patient. Patient understand the risks, benefits and expectations and wishes to proceed with surgery.   PCP:  [name], MD  D/C Plans: Home  PMH: Past Medical History:  Diagnosis Date  . Anxiety   . Depression   . High cholesterol   . Hypertension   . Hypothyroid     PSH: Past Surgical History:  Procedure Laterality Date  . BUNIONECTOMY    . COLONOSCOPY  06/18/2012   Procedure: COLONOSCOPY;  Surgeon: [name], MD;  Location: [facility];  Service: Endoscopy;  Laterality: N/A;  730  . hammer toe repair    . HEMORRHOID SURGERY    . THYROIDECTOMY    . TONSILLECTOMY    . TOTAL ABDOMINAL HYSTERECTOMY      Social History:  reports that she has never smoked. She has never used smokeless tobacco. She reports that she does not drink alcohol or use drugs.  Allergies:  Allergies  Allergen Reactions  . Penicillins Rash  . Sulfa Antibiotics Rash    Medications: No current facility-administered medications for this encounter.    Current Outpatient Prescriptions  Medication Sig Dispense Refill  . ALPRAZolam (XANAX) 0.25 MG tablet Take 0.25 mg by mouth at bedtime as needed.     . calcium carbonate (OS-CAL) 600 MG TABS Take 600 mg by mouth 2 (two) times daily with a meal.    . cholecalciferol (VITAMIN D) 1000 UNITS tablet Take 2,000 Units by mouth daily.     . citalopram (CELEXA) 20 MG tablet Take 20 mg by mouth daily.    Marland Kitchen doxazosin (CARDURA) 2 MG tablet Take  2 mg by mouth at bedtime.    Marland Kitchen levothyroxine (SYNTHROID, LEVOTHROID) 75 MCG tablet Take 75 mcg by mouth daily.    Marland Kitchen MAGNESIUM GLYCINATE PLUS PO Take by mouth. Patient states that she takes 1-2 daily - This is a herbal form Hydrographic surveyor    . simvastatin (ZOCOR) 20 MG tablet Take 20 mg by mouth every evening.    . valsartan-hydrochlorothiazide (DIOVAN-HCT) 320-12.5 MG per tablet Take 1 tablet by mouth daily.      No results found for this or any previous visit (from the past 48 hour(s)). No results found.  ROS: Pain with rom of the left upper extremity  Physical Exam:  Alert and oriented 71 y.o. female in no acute distress Cranial nerves 2-12 intact Cervical spine: full rom with no tenderness, nv intact distally Chest: active breath sounds bilaterally, no wheeze rhonchi or rales Heart: regular rate and rhythm, no murmur Abd: non tender non distended with active bowel sounds Hip is stable with rom  Left shoulder with moderate crepitus with rom  Mild decrease with rom nv intact distally No rashes or edema  Assessment/Plan Assessment: left shoulder end stage osteoarthritis   Plan: Patient will undergo a left total shoulder by Dr. Veverly Fells at [facility]. Risks benefits and expectations were discussed with the patient. Patient understand risks, benefits and expectations and wishes to proceed.

## 2017-01-29 ENCOUNTER — Encounter (HOSPITAL_COMMUNITY): Payer: Self-pay

## 2017-01-29 NOTE — Pre-Procedure Instructions (Signed)
CRESTA RIDEN  01/29/2017      [facility] - [place], [place] - [address] [place] [address] Phone: [phone] Fax: [phone]    Your procedure is scheduled on March 16  Report to [place] at 530 A.M.  Call this number if you have problems the morning of surgery:  [phone]   Remember:  Do not eat food or drink liquids after midnight.  Take these medicines the morning of surgery with A SIP OF WATER tylenol if needed, a;prazolam (Xanax) if needed, Levothyroxine (Synthroid), loratadine (Claritin)  Stop taking aspirin, BC's, Goody's, Aleve, Herbal medications, Fish Oil, Vitamins, Ibuprofen, Advil, Motrin   Do not wear jewelry, make-up or nail polish.  Do not wear lotions, powders, or perfumes, or deoderant.  Do not shave 48 hours prior to surgery.  Men may shave face and neck.  Do not bring valuables to the hospital.  [facility] is not responsible for any belongings or valuables.  Contacts, dentures or bridgework may not be worn into surgery.  Leave your suitcase in the car.  After surgery it may be brought to your room.  For patients admitted to the hospital, discharge time will be determined by your treatment team.  Patients discharged the day of surgery will not be allowed to drive home.    Special instructions:  [facility] - Preparing for Surgery  Before surgery, you can play an important role.  Because skin is not sterile, your skin needs to be as free of germs as possible.  You can reduce the number of germs on you skin by washing with CHG (chlorahexidine gluconate) soap before surgery.  CHG is an antiseptic cleaner which kills germs and bonds with the skin to continue killing germs even after washing.  Please DO NOT use if you have an allergy to CHG or antibacterial soaps.  If your skin becomes reddened/irritated stop using the CHG and inform your nurse when you arrive at Short Stay.  Do not shave (including legs and  underarms) for at least 48 hours prior to the first CHG shower.  You may shave your face.  Please follow these instructions carefully:   1.  Shower with CHG Soap the night before surgery and the                                morning of Surgery.  2.  If you choose to wash your hair, wash your hair first as usual with your       normal shampoo.  3.  After you shampoo, rinse your hair and body thoroughly to remove the                      Shampoo.  4.  Use CHG as you would any other liquid soap.  You can apply chg directly       to the skin and wash gently with scrungie or a clean washcloth.  5.  Apply the CHG Soap to your body ONLY FROM THE NECK DOWN.        Do not use on open wounds or open sores.  Avoid contact with your eyes,       ears, mouth and genitals (private parts).  Wash genitals (private parts)       with your normal soap.  6.  Wash thoroughly, paying special attention to the  area where your surgery        will be performed.  7.  Thoroughly rinse your body with warm water from the neck down.  8.  DO NOT shower/wash with your normal soap after using and rinsing off       the CHG Soap.  9.  Pat yourself dry with a clean towel.            10.  Wear clean pajamas.            11.  Place clean sheets on your bed the night of your first shower and do not        sleep with pets.  Day of Surgery  Do not apply any lotions/deoderants the morning of surgery.  Please wear clean clothes to the hospital/surgery center.     Please read over the following fact sheets that you were given. Pain Booklet, Coughing and Deep Breathing, MRSA Information and Surgical Site Infection Prevention, Incentive spirometry

## 2017-01-30 ENCOUNTER — Encounter (HOSPITAL_COMMUNITY): Payer: Self-pay

## 2017-01-30 ENCOUNTER — Encounter (HOSPITAL_COMMUNITY)
Admission: RE | Admit: 2017-01-30 | Discharge: 2017-01-30 | Disposition: A | Discharge status: Home / Self Care | Payer: Medicare Other | Source: Ambulatory Visit | Attending: Orthopedic Surgery | Admitting: Orthopedic Surgery

## 2017-01-30 ENCOUNTER — Other Ambulatory Visit: Payer: Self-pay

## 2017-01-30 DIAGNOSIS — I10 Essential (primary) hypertension: Secondary | ICD-10-CM | POA: Diagnosis not present

## 2017-01-30 DIAGNOSIS — E039 Hypothyroidism, unspecified: Secondary | ICD-10-CM | POA: Diagnosis not present

## 2017-01-30 DIAGNOSIS — Z01812 Encounter for preprocedural laboratory examination: Secondary | ICD-10-CM | POA: Insufficient documentation

## 2017-01-30 DIAGNOSIS — E7800 Pure hypercholesterolemia, unspecified: Secondary | ICD-10-CM | POA: Insufficient documentation

## 2017-01-30 DIAGNOSIS — K219 Gastro-esophageal reflux disease without esophagitis: Secondary | ICD-10-CM | POA: Insufficient documentation

## 2017-01-30 HISTORY — DX: Gastro-esophageal reflux disease without esophagitis: K21.9

## 2017-01-30 HISTORY — DX: Unspecified osteoarthritis, unspecified site: M19.90

## 2017-01-30 LAB — BASIC METABOLIC PANEL
ANION GAP: 6 (ref 5–15)
BUN: 10 mg/dL (ref 6–20)
CHLORIDE: 107 mmol/L (ref 101–111)
CO2: 26 mmol/L (ref 22–32)
Calcium: 10 mg/dL (ref 8.9–10.3)
Creatinine, Ser: 0.79 mg/dL (ref 0.44–1.00)
GFR calc non Af Amer: >60 mL/min (ref >60)
Glucose, Bld: 117 mg/dL — ABNORMAL HIGH (ref 65–99)
POTASSIUM: 3.6 mmol/L (ref 3.5–5.1)
SODIUM: 139 mmol/L (ref 135–145)

## 2017-01-30 LAB — CBC
HCT: 44.3 % (ref 36.0–46.0)
HEMOGLOBIN: 14.9 g/dL (ref 12.0–15.0)
MCH: 31.2 pg (ref 26.0–34.0)
MCHC: 33.6 g/dL (ref 30.0–36.0)
MCV: 92.9 fL (ref 78.0–100.0)
PLATELETS: 251 10*3/uL (ref 150–400)
RBC: 4.77 MIL/uL (ref 3.87–5.11)
RDW: 13.2 % (ref 11.5–15.5)
WBC: 9.1 10*3/uL (ref 4.0–10.5)

## 2017-01-30 LAB — SURGICAL PCR SCREEN
MRSA, PCR: NEGATIVE
Staphylococcus aureus: NEGATIVE

## 2017-01-30 NOTE — Progress Notes (Addendum)
PCp is Dr. [name] States she saw a Cardiologist 6 years ago. Dr Rozann Lesches States she had an echo and stress test then-Requested report. Denies any chest pain. Denies ever having a card cath. Bp elevated today states she took her bp meds this am. Will recheck before she leaves today. 150/80

## 2017-02-02 NOTE — Progress Notes (Signed)
Anesthesia Chart Review: Patient is a 71 year old female scheduled for left total shoulder arthroplasty on 02/06/17 by Dr. Veverly Fells.  History includes never smoker, hypercholesterolemia, thyroidectomy, hypothyroidism, hypertension, anxiety, depression, GERD, arthritis, hysterectomy, tonsillectomy, appendectomy. BMI is consistent with obesity.  PCP is Dr. Jerene Bears. She is not followed by a cardiologist routinely, but had testing at [facility] (read by Dr. Rozann Lesches) in 2011 for evaluation of chest pain.  Meds include Xanax, Celexa, doxazosin, levothyroxine, Claritin, magnesium, Zocor, Diovan-HCT  BP (!) 150/80 Comment: taken manually/Notified Trina RN  Pulse 87   Temp 36.8 C   Resp 20   Ht 5\' 1"  (1.549 m)   Wt 186 lb 9.6 oz (84.6 kg)   SpO2 96%   BMI 35.26 kg/m   EKG 01/30/17: NSR, LVH. No significant change since last tracing from 04/16/10 (see Muse).    Nuclear stress test 12/31/09 [facility]; scanned under Media tab, Outside Record 04/27/10): Comments: 1. Lexiscan bolus per protocol. The patient experience no chest pain. Nondiagnostic ST changes in the setting of LVH. There was a hypertensive response to stress. 2. Normal LV perfusion. Limitations and artifact were due to breast tissue. No focal ischemia. The patient's calculated post stress LVEF was 74%. TID Ratio normal at 0.96.  Echo 12/31/09 ([facility]; scanned under Media tab, Outside Record 04/27/10): Conclusion: 1. The left ventricular chamber size is normal. Mild concentric LVH. Global left ventricular wall motion and contractility are within normal limits. The estimated ejection fraction is 60-65%. Abnormal left ventricular diastolic filling is observed, consistent with impaired relaxation. 2. Trace mitral regurgitation. 3. Mild aortic leaflet calcification is visualized. There is aortic annular calcification. There is no evidence of aortic regurgitation. The mean gradient of the aortic valve is 5 mmHg. 4. The right  ventricular global systolic function is normal. 5. Trace tricuspid regurgitation. 6. No pericardial effusion.  Preoperative labs noted.   If no acute changes then I anticipate that she can proceed as planned.  George Hugh [facility]/Anesthesiology Phone [phone] 02/02/2017 5:28 PM

## 2017-02-04 MED ORDER — SODIUM CHLORIDE 0.9 % IJ SOLN
INTRAMUSCULAR | Status: AC
  Filled 2017-02-04: qty 60

## 2017-02-05 MED ORDER — CLINDAMYCIN PHOSPHATE 900 MG/50ML IV SOLN
900.0000 mg | INTRAVENOUS | Status: AC
  Administered 2017-02-06: 900 mg via INTRAVENOUS
  Filled 2017-02-05: qty 50

## 2017-02-05 NOTE — Anesthesia Preprocedure Evaluation (Addendum)
Anesthesia Evaluation  Patient identified by MRN, date of birth, ID band Patient awake    Reviewed: Allergy & Precautions, NPO status , Patient's Chart, lab work & pertinent test results  Airway Mallampati: II       Dental  (+) Teeth Intact   Pulmonary    breath sounds clear to auscultation       Cardiovascular hypertension, (-) Past MI  Rhythm:Regular Rate:Normal     Neuro/Psych PSYCHIATRIC DISORDERS Anxiety Depression    GI/Hepatic GERD (occasional , no meds)  ,  Endo/Other  Hypothyroidism   Renal/GU      Musculoskeletal  (+) Arthritis ,   Abdominal   Peds  Hematology   Anesthesia Other Findings   Reproductive/Obstetrics                           Anesthesia Physical Anesthesia Plan  ASA: II  Anesthesia Plan: General   Post-op Pain Management:  Regional for Post-op pain   Induction: Intravenous  Airway Management Planned: Oral ETT  Additional Equipment:   Intra-op Plan:   Post-operative Plan: Extubation in OR  Informed Consent: I have reviewed the patients History and Physical, chart, labs and discussed the procedure including the risks, benefits and alternatives for the proposed anesthesia with the patient or authorized representative who has indicated his/her understanding and acceptance.     Plan Discussed with: CRNA  Anesthesia Plan Comments: (Reviewed Zelenak PA Anesthesia notes 01/30/17)       Anesthesia Quick Evaluation

## 2017-02-06 ENCOUNTER — Encounter (HOSPITAL_COMMUNITY): Payer: Self-pay

## 2017-02-06 ENCOUNTER — Inpatient Hospital Stay (HOSPITAL_COMMUNITY): Payer: Medicare Other

## 2017-02-06 ENCOUNTER — Inpatient Hospital Stay (HOSPITAL_COMMUNITY): Payer: Medicare Other | Admitting: Anesthesiology

## 2017-02-06 ENCOUNTER — Inpatient Hospital Stay (HOSPITAL_COMMUNITY): Payer: Medicare Other | Admitting: Vascular Surgery

## 2017-02-06 ENCOUNTER — Inpatient Hospital Stay (HOSPITAL_COMMUNITY)
Admission: RE | Admit: 2017-02-06 | Discharge: 2017-02-09 | DRG: 483 | Disposition: A | Discharge status: Skilled Nursing Facility | Payer: Medicare Other | Source: Ambulatory Visit | Attending: Orthopedic Surgery | Admitting: Orthopedic Surgery

## 2017-02-06 ENCOUNTER — Encounter (HOSPITAL_COMMUNITY)
Admission: RE | Disposition: A | Discharge status: Skilled Nursing Facility | Payer: Self-pay | Source: Ambulatory Visit | Attending: Orthopedic Surgery

## 2017-02-06 DIAGNOSIS — I10 Essential (primary) hypertension: Secondary | ICD-10-CM | POA: Diagnosis present

## 2017-02-06 DIAGNOSIS — M25512 Pain in left shoulder: Secondary | ICD-10-CM | POA: Diagnosis not present

## 2017-02-06 DIAGNOSIS — F329 Major depressive disorder, single episode, unspecified: Secondary | ICD-10-CM | POA: Diagnosis not present

## 2017-02-06 DIAGNOSIS — F418 Other specified anxiety disorders: Secondary | ICD-10-CM | POA: Diagnosis present

## 2017-02-06 DIAGNOSIS — E039 Hypothyroidism, unspecified: Secondary | ICD-10-CM | POA: Diagnosis present

## 2017-02-06 DIAGNOSIS — M19012 Primary osteoarthritis, left shoulder: Secondary | ICD-10-CM | POA: Diagnosis not present

## 2017-02-06 DIAGNOSIS — E785 Hyperlipidemia, unspecified: Secondary | ICD-10-CM | POA: Diagnosis not present

## 2017-02-06 DIAGNOSIS — G8911 Acute pain due to trauma: Secondary | ICD-10-CM | POA: Diagnosis not present

## 2017-02-06 DIAGNOSIS — K219 Gastro-esophageal reflux disease without esophagitis: Secondary | ICD-10-CM | POA: Diagnosis present

## 2017-02-06 DIAGNOSIS — M1990 Unspecified osteoarthritis, unspecified site: Secondary | ICD-10-CM | POA: Diagnosis not present

## 2017-02-06 DIAGNOSIS — E038 Other specified hypothyroidism: Secondary | ICD-10-CM | POA: Diagnosis not present

## 2017-02-06 DIAGNOSIS — E7800 Pure hypercholesterolemia, unspecified: Secondary | ICD-10-CM | POA: Diagnosis present

## 2017-02-06 DIAGNOSIS — Z471 Aftercare following joint replacement surgery: Secondary | ICD-10-CM | POA: Diagnosis not present

## 2017-02-06 DIAGNOSIS — M6281 Muscle weakness (generalized): Secondary | ICD-10-CM | POA: Diagnosis not present

## 2017-02-06 DIAGNOSIS — E876 Hypokalemia: Secondary | ICD-10-CM | POA: Diagnosis present

## 2017-02-06 DIAGNOSIS — S6980XA Other specified injuries of unspecified wrist, hand and finger(s), initial encounter: Secondary | ICD-10-CM | POA: Diagnosis not present

## 2017-02-06 DIAGNOSIS — R262 Difficulty in walking, not elsewhere classified: Secondary | ICD-10-CM | POA: Diagnosis not present

## 2017-02-06 DIAGNOSIS — Z96612 Presence of left artificial shoulder joint: Secondary | ICD-10-CM

## 2017-02-06 DIAGNOSIS — G8918 Other acute postprocedural pain: Secondary | ICD-10-CM | POA: Diagnosis not present

## 2017-02-06 HISTORY — PX: TOTAL SHOULDER ARTHROPLASTY: SHX126

## 2017-02-06 SURGERY — ARTHROPLASTY, SHOULDER, TOTAL
Anesthesia: General | Site: Shoulder | Laterality: Left

## 2017-02-06 MED ORDER — PHENYLEPHRINE HCL 10 MG/ML IJ SOLN
INTRAVENOUS | Status: DC | PRN
  Administered 2017-02-06: 25 ug/min via INTRAVENOUS

## 2017-02-06 MED ORDER — MIDAZOLAM HCL 2 MG/2ML IJ SOLN
INTRAMUSCULAR | Status: AC
  Filled 2017-02-06: qty 2

## 2017-02-06 MED ORDER — ROCURONIUM BROMIDE 50 MG/5ML IV SOSY
PREFILLED_SYRINGE | INTRAVENOUS | Status: AC
  Filled 2017-02-06: qty 5

## 2017-02-06 MED ORDER — METOCLOPRAMIDE HCL 5 MG PO TABS
5.0000 mg | ORAL_TABLET | Freq: Three times a day (TID) | ORAL | Status: DC | PRN
  Administered 2017-02-09: 10 mg via ORAL
  Filled 2017-02-06: qty 2

## 2017-02-06 MED ORDER — SODIUM CHLORIDE 0.9 % IV SOLN
INTRAVENOUS | Status: DC
  Administered 2017-02-07: 05:00:00 via INTRAVENOUS

## 2017-02-06 MED ORDER — HYDROMORPHONE HCL 1 MG/ML IJ SOLN
INTRAMUSCULAR | Status: AC
  Filled 2017-02-06: qty 0.5

## 2017-02-06 MED ORDER — BISACODYL 10 MG RE SUPP: 10.0000 mg | Freq: Every day | RECTAL | Status: DC | PRN

## 2017-02-06 MED ORDER — CHLORHEXIDINE GLUCONATE 4 % EX LIQD: 60.0000 mL | Freq: Once | CUTANEOUS | Status: DC

## 2017-02-06 MED ORDER — 0.9 % SODIUM CHLORIDE (POUR BTL) OPTIME
TOPICAL | Status: DC | PRN
  Administered 2017-02-06: 1000 mL

## 2017-02-06 MED ORDER — IRBESARTAN 300 MG PO TABS
300.0000 mg | ORAL_TABLET | Freq: Every day | ORAL | Status: DC
  Administered 2017-02-06 – 2017-02-09 (×4): 300 mg via ORAL
  Filled 2017-02-06 (×4): qty 1

## 2017-02-06 MED ORDER — MEPERIDINE HCL 25 MG/ML IJ SOLN: 6.2500 mg | INTRAMUSCULAR | Status: DC | PRN

## 2017-02-06 MED ORDER — MORPHINE SULFATE (PF) 2 MG/ML IV SOLN
2.0000 mg | INTRAVENOUS | Status: DC | PRN
  Administered 2017-02-06: 2 mg via INTRAVENOUS
  Filled 2017-02-06: qty 1

## 2017-02-06 MED ORDER — ONDANSETRON HCL 4 MG/2ML IJ SOLN
INTRAMUSCULAR | Status: AC
  Filled 2017-02-06: qty 2

## 2017-02-06 MED ORDER — FENTANYL CITRATE (PF) 100 MCG/2ML IJ SOLN
INTRAMUSCULAR | Status: AC
  Filled 2017-02-06: qty 2

## 2017-02-06 MED ORDER — METOCLOPRAMIDE HCL 5 MG/ML IJ SOLN
5.0000 mg | Freq: Three times a day (TID) | INTRAMUSCULAR | Status: DC | PRN
  Administered 2017-02-06 – 2017-02-07 (×2): 10 mg via INTRAVENOUS
  Filled 2017-02-06 (×2): qty 2

## 2017-02-06 MED ORDER — MAGNESIUM OXIDE 400 (241.3 MG) MG PO TABS
400.0000 mg | ORAL_TABLET | Freq: Every day | ORAL | Status: DC
  Administered 2017-02-07 – 2017-02-09 (×3): 400 mg via ORAL
  Filled 2017-02-06 (×3): qty 1

## 2017-02-06 MED ORDER — PROPOFOL 10 MG/ML IV BOLUS
INTRAVENOUS | Status: AC
  Filled 2017-02-06: qty 20

## 2017-02-06 MED ORDER — ALPRAZOLAM 0.25 MG PO TABS: 0.2500 mg | ORAL_TABLET | Freq: Every day | ORAL | Status: DC | PRN

## 2017-02-06 MED ORDER — EPHEDRINE SULFATE-NACL 50-0.9 MG/10ML-% IV SOSY
PREFILLED_SYRINGE | INTRAVENOUS | Status: DC | PRN
  Administered 2017-02-06: 10 mg via INTRAVENOUS
  Administered 2017-02-06: 15 mg via INTRAVENOUS
  Administered 2017-02-06 (×2): 5 mg via INTRAVENOUS

## 2017-02-06 MED ORDER — POLYETHYLENE GLYCOL 3350 17 G PO PACK
17.0000 g | PACK | Freq: Every day | ORAL | Status: DC | PRN
  Administered 2017-02-08: 17 g via ORAL
  Filled 2017-02-06: qty 1

## 2017-02-06 MED ORDER — BUPIVACAINE HCL (PF) 0.25 % IJ SOLN
INTRAMUSCULAR | Status: DC | PRN
  Administered 2017-02-06: 20 mL

## 2017-02-06 MED ORDER — ONDANSETRON HCL 4 MG/2ML IJ SOLN: 4.0000 mg | Freq: Once | INTRAMUSCULAR | Status: DC | PRN

## 2017-02-06 MED ORDER — LIDOCAINE HCL (CARDIAC) 20 MG/ML IV SOLN
INTRAVENOUS | Status: DC | PRN
  Administered 2017-02-06: 40 mg via INTRAVENOUS

## 2017-02-06 MED ORDER — EPINEPHRINE PF 1 MG/ML IJ SOLN
INTRAMUSCULAR | Status: AC
Start: 2017-02-06 — End: 2017-02-06
  Filled 2017-02-06: qty 1

## 2017-02-06 MED ORDER — KETOROLAC TROMETHAMINE 15 MG/ML IJ SOLN
15.0000 mg | Freq: Once | INTRAMUSCULAR | Status: AC
  Administered 2017-02-06: 15 mg via INTRAVENOUS
  Filled 2017-02-06: qty 1

## 2017-02-06 MED ORDER — ONDANSETRON HCL 4 MG/2ML IJ SOLN
4.0000 mg | Freq: Four times a day (QID) | INTRAMUSCULAR | Status: DC | PRN
  Administered 2017-02-07 – 2017-02-08 (×3): 4 mg via INTRAVENOUS
  Filled 2017-02-06 (×3): qty 2

## 2017-02-06 MED ORDER — VITAMIN D 1000 UNITS PO TABS: 2000.0000 [IU] | ORAL_TABLET | Freq: Every day | ORAL | Status: DC

## 2017-02-06 MED ORDER — ROCURONIUM BROMIDE 50 MG/5ML IV SOSY
PREFILLED_SYRINGE | INTRAVENOUS | Status: AC
  Filled 2017-02-06: qty 15

## 2017-02-06 MED ORDER — BUPIVACAINE HCL (PF) 0.25 % IJ SOLN
INTRAMUSCULAR | Status: AC
  Filled 2017-02-06: qty 30

## 2017-02-06 MED ORDER — ACETAMINOPHEN 325 MG PO TABS: 650.0000 mg | ORAL_TABLET | Freq: Four times a day (QID) | ORAL | Status: DC | PRN

## 2017-02-06 MED ORDER — GELATIN ABSORBABLE MT POWD
OROMUCOSAL | Status: DC | PRN
  Administered 2017-02-06: 09:00:00 via TOPICAL

## 2017-02-06 MED ORDER — DOCUSATE SODIUM 100 MG PO CAPS
100.0000 mg | ORAL_CAPSULE | Freq: Two times a day (BID) | ORAL | Status: DC
  Administered 2017-02-06 – 2017-02-09 (×7): 100 mg via ORAL
  Filled 2017-02-06 (×7): qty 1

## 2017-02-06 MED ORDER — CALCIUM CARBONATE 1500 (600 CA) MG PO TABS
1500.0000 mg | ORAL_TABLET | Freq: Every day | ORAL | Status: DC
  Administered 2017-02-07 – 2017-02-09 (×3): 1500 mg via ORAL
  Filled 2017-02-06 (×3): qty 1

## 2017-02-06 MED ORDER — ONDANSETRON HCL 4 MG/2ML IJ SOLN
INTRAMUSCULAR | Status: DC | PRN
  Administered 2017-02-06: 4 mg via INTRAVENOUS

## 2017-02-06 MED ORDER — MENTHOL 3 MG MT LOZG: 1.0000 | LOZENGE | OROMUCOSAL | Status: DC | PRN

## 2017-02-06 MED ORDER — CLINDAMYCIN PHOSPHATE 600 MG/50ML IV SOLN
600.0000 mg | Freq: Four times a day (QID) | INTRAVENOUS | Status: AC
  Administered 2017-02-06 – 2017-02-07 (×3): 600 mg via INTRAVENOUS
  Filled 2017-02-06 (×3): qty 50

## 2017-02-06 MED ORDER — HYDROCHLOROTHIAZIDE 12.5 MG PO CAPS
12.5000 mg | ORAL_CAPSULE | Freq: Every day | ORAL | Status: DC
  Administered 2017-02-06 – 2017-02-09 (×4): 12.5 mg via ORAL
  Filled 2017-02-06 (×4): qty 1

## 2017-02-06 MED ORDER — PROPOFOL 10 MG/ML IV BOLUS
INTRAVENOUS | Status: DC | PRN
  Administered 2017-02-06: 120 mg via INTRAVENOUS

## 2017-02-06 MED ORDER — FENTANYL CITRATE (PF) 100 MCG/2ML IJ SOLN
25.0000 ug | INTRAMUSCULAR | Status: DC | PRN
  Administered 2017-02-06 (×4): 25 ug via INTRAVENOUS

## 2017-02-06 MED ORDER — SUGAMMADEX SODIUM 200 MG/2ML IV SOLN
INTRAVENOUS | Status: DC | PRN
  Administered 2017-02-06: 200 mg via INTRAVENOUS

## 2017-02-06 MED ORDER — PHENOL 1.4 % MT LIQD: 1.0000 | OROMUCOSAL | Status: DC | PRN

## 2017-02-06 MED ORDER — THROMBIN 5000 UNITS EX SOLR
CUTANEOUS | Status: AC
  Filled 2017-02-06: qty 5000

## 2017-02-06 MED ORDER — LIDOCAINE 2% (20 MG/ML) 5 ML SYRINGE
INTRAMUSCULAR | Status: AC
  Filled 2017-02-06: qty 5

## 2017-02-06 MED ORDER — VITAMIN D 1000 UNITS PO TABS
2000.0000 [IU] | ORAL_TABLET | Freq: Every day | ORAL | Status: DC
  Administered 2017-02-07 – 2017-02-09 (×3): 2000 [IU] via ORAL
  Filled 2017-02-06 (×3): qty 2

## 2017-02-06 MED ORDER — BUPIVACAINE-EPINEPHRINE 0.25% -1:200000 IJ SOLN
INTRAMUSCULAR | Status: DC | PRN
  Administered 2017-02-06: 10 mL

## 2017-02-06 MED ORDER — ONDANSETRON HCL 4 MG PO TABS
4.0000 mg | ORAL_TABLET | Freq: Four times a day (QID) | ORAL | Status: DC | PRN
  Administered 2017-02-08 – 2017-02-09 (×2): 4 mg via ORAL
  Filled 2017-02-06 (×3): qty 1

## 2017-02-06 MED ORDER — METHOCARBAMOL 500 MG PO TABS: 500.0000 mg | ORAL_TABLET | Freq: Three times a day (TID) | ORAL | 1 refills | Status: DC | PRN

## 2017-02-06 MED ORDER — LEVOTHYROXINE SODIUM 75 MCG PO TABS
75.0000 ug | ORAL_TABLET | Freq: Every day | ORAL | Status: DC
  Administered 2017-02-07 – 2017-02-09 (×3): 75 ug via ORAL
  Filled 2017-02-06 (×3): qty 1

## 2017-02-06 MED ORDER — CITALOPRAM HYDROBROMIDE 20 MG PO TABS
20.0000 mg | ORAL_TABLET | Freq: Every evening | ORAL | Status: DC
  Administered 2017-02-06 – 2017-02-08 (×3): 20 mg via ORAL
  Filled 2017-02-06 (×3): qty 1

## 2017-02-06 MED ORDER — FENTANYL CITRATE (PF) 100 MCG/2ML IJ SOLN
INTRAMUSCULAR | Status: DC | PRN
  Administered 2017-02-06 (×2): 50 ug via INTRAVENOUS

## 2017-02-06 MED ORDER — ROCURONIUM BROMIDE 100 MG/10ML IV SOLN
INTRAVENOUS | Status: DC | PRN
  Administered 2017-02-06: 20 mg via INTRAVENOUS

## 2017-02-06 MED ORDER — EPHEDRINE 5 MG/ML INJ
INTRAVENOUS | Status: AC
  Filled 2017-02-06: qty 10

## 2017-02-06 MED ORDER — HYDROCODONE-ACETAMINOPHEN 5-325 MG PO TABS: 0.5000 | ORAL_TABLET | Freq: Four times a day (QID) | ORAL | 0 refills | Status: DC | PRN

## 2017-02-06 MED ORDER — DOXAZOSIN MESYLATE 2 MG PO TABS
2.0000 mg | ORAL_TABLET | Freq: Every day | ORAL | Status: DC
  Administered 2017-02-06 – 2017-02-08 (×3): 2 mg via ORAL
  Filled 2017-02-06 (×3): qty 1

## 2017-02-06 MED ORDER — ACETAMINOPHEN 650 MG RE SUPP
650.0000 mg | Freq: Four times a day (QID) | RECTAL | Status: DC | PRN
Start: 2017-02-06 — End: 2017-02-09

## 2017-02-06 MED ORDER — LORATADINE 10 MG PO TABS
10.0000 mg | ORAL_TABLET | Freq: Every day | ORAL | Status: DC
  Administered 2017-02-06 – 2017-02-09 (×4): 10 mg via ORAL
  Filled 2017-02-06 (×4): qty 1

## 2017-02-06 MED ORDER — SUCCINYLCHOLINE CHLORIDE 200 MG/10ML IV SOSY
PREFILLED_SYRINGE | INTRAVENOUS | Status: AC
  Filled 2017-02-06: qty 10

## 2017-02-06 MED ORDER — HYDROCODONE-ACETAMINOPHEN 5-325 MG PO TABS
1.0000 | ORAL_TABLET | ORAL | Status: DC | PRN
  Administered 2017-02-06 – 2017-02-09 (×11): 2 via ORAL
  Filled 2017-02-06 (×11): qty 2

## 2017-02-06 MED ORDER — ACETAMINOPHEN 500 MG PO TABS: 1000.0000 mg | ORAL_TABLET | Freq: Four times a day (QID) | ORAL | Status: DC | PRN

## 2017-02-06 MED ORDER — VALSARTAN-HYDROCHLOROTHIAZIDE 320-12.5 MG PO TABS: 1.0000 | ORAL_TABLET | Freq: Every day | ORAL | Status: DC

## 2017-02-06 MED ORDER — SIMVASTATIN 20 MG PO TABS
20.0000 mg | ORAL_TABLET | Freq: Every evening | ORAL | Status: DC
Start: 2017-02-06 — End: 2017-02-09
  Administered 2017-02-06 – 2017-02-08 (×3): 20 mg via ORAL
  Filled 2017-02-06 (×2): qty 1

## 2017-02-06 MED ORDER — SUGAMMADEX SODIUM 200 MG/2ML IV SOLN
INTRAVENOUS | Status: AC
  Filled 2017-02-06: qty 2

## 2017-02-06 MED ORDER — KETOROLAC TROMETHAMINE 30 MG/ML IJ SOLN
INTRAMUSCULAR | Status: AC
  Filled 2017-02-06: qty 1

## 2017-02-06 MED ORDER — MIDAZOLAM HCL 5 MG/5ML IJ SOLN
INTRAMUSCULAR | Status: DC | PRN
  Administered 2017-02-06: 1 mg via INTRAVENOUS

## 2017-02-06 MED ORDER — LACTATED RINGERS IV SOLN
INTRAVENOUS | Status: DC | PRN
  Administered 2017-02-06: 07:00:00 via INTRAVENOUS
  Administered 2017-02-06: 10:00:00

## 2017-02-06 SURGICAL SUPPLY — 78 items
BIT DRILL 5/64X5 DISP (BIT) IMPLANT
BLADE SAW SAG 73X25 THK (BLADE) ×1
BLADE SAW SGTL 73X25 THK (BLADE) ×1 IMPLANT
BUR SURG 4X8 MED (BURR) IMPLANT
BURR SURG 4X8 MED (BURR)
CAPT SHLDR TOTAL 2 ×2 IMPLANT
CEMENT BONE DEPUY (Cement) ×2 IMPLANT
COVER SURGICAL LIGHT HANDLE (MISCELLANEOUS) ×2 IMPLANT
DRAPE IMP U-DRAPE 54X76 (DRAPES) ×2 IMPLANT
DRAPE INCISE IOBAN 66X45 STRL (DRAPES) ×4 IMPLANT
DRAPE ORTHO SPLIT 77X108 STRL (DRAPES) ×2
DRAPE SURG ORHT 6 SPLT 77X108 (DRAPES) ×2 IMPLANT
DRAPE U-SHAPE 47X51 STRL (DRAPES) ×2 IMPLANT
DRAPE X-RAY CASS 24X20 (DRAPES) IMPLANT
DRSG ADAPTIC 3X8 NADH LF (GAUZE/BANDAGES/DRESSINGS) ×2 IMPLANT
DRSG PAD ABDOMINAL 8X10 ST (GAUZE/BANDAGES/DRESSINGS) ×2 IMPLANT
DURAPREP 26ML APPLICATOR (WOUND CARE) ×2 IMPLANT
ELECT BLADE 4.0 EZ CLEAN MEGAD (MISCELLANEOUS) ×2
ELECT NEEDLE TIP 2.8 STRL (NEEDLE) ×2 IMPLANT
ELECT REM PT RETURN 9FT ADLT (ELECTROSURGICAL) ×2
ELECTRODE BLDE 4.0 EZ CLN MEGD (MISCELLANEOUS) ×1 IMPLANT
ELECTRODE REM PT RTRN 9FT ADLT (ELECTROSURGICAL) ×1 IMPLANT
FILTER STRAW FLUID ASPIR (MISCELLANEOUS) ×2 IMPLANT
GAUZE SPONGE 4X4 12PLY STRL (GAUZE/BANDAGES/DRESSINGS) ×2 IMPLANT
GLOVE BIOGEL PI ORTHO PRO 7.5 (GLOVE) ×1
GLOVE BIOGEL PI ORTHO PRO SZ7 (GLOVE) ×1
GLOVE BIOGEL PI ORTHO PRO SZ8 (GLOVE) ×1
GLOVE ORTHO TXT STRL SZ7.5 (GLOVE) ×2 IMPLANT
GLOVE PI ORTHO PRO STRL 7.5 (GLOVE) ×1 IMPLANT
GLOVE PI ORTHO PRO STRL SZ7 (GLOVE) ×1 IMPLANT
GLOVE PI ORTHO PRO STRL SZ8 (GLOVE) ×1 IMPLANT
GLOVE SURG ORTHO 8.5 STRL (GLOVE) ×4 IMPLANT
GOWN STRL REUS W/ TWL XL LVL3 (GOWN DISPOSABLE) ×3 IMPLANT
GOWN STRL REUS W/TWL XL LVL3 (GOWN DISPOSABLE) ×3
HANDPIECE INTERPULSE COAX TIP (DISPOSABLE)
KIT BASIN OR (CUSTOM PROCEDURE TRAY) ×2 IMPLANT
KIT ROOM TURNOVER OR (KITS) ×2 IMPLANT
MANIFOLD NEPTUNE II (INSTRUMENTS) ×2 IMPLANT
NDL SUT 6 .5 CRC .975X.05 MAYO (NEEDLE) IMPLANT
NEEDLE 1/2 CIR MAYO (NEEDLE) ×2 IMPLANT
NEEDLE FILTER BLUNT 18X 1/2SAF (NEEDLE) ×1
NEEDLE FILTER BLUNT 18X1 1/2 (NEEDLE) ×1 IMPLANT
NEEDLE HYPO 25GX1X1/2 BEV (NEEDLE) ×2 IMPLANT
NEEDLE MAYO TAPER (NEEDLE)
NS IRRIG 1000ML POUR BTL (IV SOLUTION) ×2 IMPLANT
PACK SHOULDER (CUSTOM PROCEDURE TRAY) ×2 IMPLANT
PACK UNIVERSAL I (CUSTOM PROCEDURE TRAY) IMPLANT
PAD ARMBOARD 7.5X6 YLW CONV (MISCELLANEOUS) ×4 IMPLANT
SET HNDPC FAN SPRY TIP SCT (DISPOSABLE) IMPLANT
SLING ARM FOAM STRAP LRG (SOFTGOODS) ×2 IMPLANT
SLING ARM IMMOBILIZER LRG (SOFTGOODS) IMPLANT
SLING ARM IMMOBILIZER MED (SOFTGOODS) IMPLANT
SMARTMIX MINI TOWER (MISCELLANEOUS) ×2
SPONGE LAP 18X18 X RAY DECT (DISPOSABLE) ×2 IMPLANT
SPONGE LAP 4X18 X RAY DECT (DISPOSABLE) IMPLANT
SPONGE SURGIFOAM ABS GEL SZ50 (HEMOSTASIS) ×2 IMPLANT
STRIP CLOSURE SKIN 1/2X4 (GAUZE/BANDAGES/DRESSINGS) ×2 IMPLANT
SUCTION FRAZIER HANDLE 10FR (MISCELLANEOUS) ×1
SUCTION TUBE FRAZIER 10FR DISP (MISCELLANEOUS) ×1 IMPLANT
SUT BONE WAX W31G (SUTURE) ×2 IMPLANT
SUT FIBERWIRE #2 38 T-5 BLUE (SUTURE) ×10
SUT MNCRL AB 4-0 PS2 18 (SUTURE) ×2 IMPLANT
SUT VIC AB 0 CT1 27 (SUTURE) ×1
SUT VIC AB 0 CT1 27XBRD ANBCTR (SUTURE) ×1 IMPLANT
SUT VIC AB 0 CT2 27 (SUTURE) ×2 IMPLANT
SUT VIC AB 2-0 CT1 27 (SUTURE) ×1
SUT VIC AB 2-0 CT1 TAPERPNT 27 (SUTURE) ×1 IMPLANT
SUT VICRYL AB 2 0 TIES (SUTURE) IMPLANT
SUTURE FIBERWR #2 38 T-5 BLUE (SUTURE) ×5 IMPLANT
SYR CONTROL 10ML LL (SYRINGE) ×2 IMPLANT
SYR TB 1ML LUER SLIP (SYRINGE) ×2 IMPLANT
TOWEL OR 17X24 6PK STRL BLUE (TOWEL DISPOSABLE) ×2 IMPLANT
TOWEL OR 17X26 10 PK STRL BLUE (TOWEL DISPOSABLE) ×2 IMPLANT
TOWER SMARTMIX MINI (MISCELLANEOUS) ×1 IMPLANT
TRAY FOLEY W/METER SILVER 16FR (SET/KITS/TRAYS/PACK) IMPLANT
TUBE CONNECTING 12X1/4 (SUCTIONS) ×2 IMPLANT
WATER STERILE IRR 1000ML POUR (IV SOLUTION) ×2 IMPLANT
YANKAUER SUCT BULB TIP NO VENT (SUCTIONS) IMPLANT

## 2017-02-06 NOTE — Evaluation (Signed)
Physical Therapy Evaluation Patient Details Name: [name] MRN: [id number] DOB: [birth year] Today's Date: 02/06/2017   History of Present Illness  Pt is a 71 y.o. female complaining of left shoulder pain for multiple years.   PMH: anxiety, HTN.  Admit for left total shoulder arthroplasty.  Clinical Impression  Pt admitted with above diagnosis. Pt currently with functional limitations due to the deficits listed below (see PT Problem List). Pt was able to stand pivot to recliner with min assist to steady pt.  Pt lived alone PTA and had some balance issues PTA.  Does not have anyone to assist at home and will be mod assist for ADLs.  Will benefit from SNF to address functional mobility so pt can go back home.  Will follow acutely.   Pt will benefit from skilled PT to increase their independence and safety with mobility to allow discharge to the venue listed below.      Follow Up Recommendations SNF;Supervision/Assistance - 24 hour    Equipment Recommendations  Other (comment) (TBA)    Recommendations for Other Services       Precautions / Restrictions Precautions Precautions: Shoulder Type of Shoulder Precautions: conservative Shoulder Interventions: Shoulder sling/immobilizer;At all times Precaution Booklet Issued: Yes (comment) Precaution Comments: AROM elbow, wrist and hand to tolerance Required Braces or Orthoses: Sling Restrictions Weight Bearing Restrictions: Yes LUE Weight Bearing: Non weight bearing      Mobility  Bed Mobility Overal bed mobility: Needs Assistance Bed Mobility: Supine to Sit     Supine to sit: Mod assist;Min assist     General bed mobility comments: Needed some assist to elevate trunk and pad to move hips to EOB.   Transfers Overall transfer level: Needs assistance Equipment used: Rolling walker (2 wheeled) Transfers: Sit to/from [facility] Sit to Stand: Min assist Stand pivot transfers: Min assist       General  transfer comment: Pt needed a little assist to power up and then steadying assist for transfer to recliner from bed.   Ambulation/Gait             General Gait Details: transfer to chair only today  Stairs            Wheelchair Mobility    Modified Rankin (Stroke Patients Only)       Balance Overall balance assessment: Needs assistance;History of Falls Sitting-balance support: No upper extremity supported;Feet supported Sitting balance-Leahy Scale: Fair     Standing balance support: Single extremity supported;During functional activity Standing balance-Leahy Scale: Poor Standing balance comment: relies on single UE support for balance in standing.                High Level Balance Comments: did not challenge since first time OOB.              Pertinent Vitals/Pain Pain Assessment: 0-10 Pain Score: 8  Pain Location: shoulder Pain Descriptors / Indicators: Aching;Grimacing;Guarding;Operative site guarding Pain Intervention(s): Limited activity within patient's tolerance;Monitored during session;Repositioned;Ice applied  O2 90-96% on 2LO2.    Home Living Family/patient expects to be discharged to:: Private residence Living Arrangements: Alone Available Help at Discharge: Family;Available PRN/intermittently Type of Home: House Home Access: Stairs to enter Entrance Stairs-Rails: Right Entrance Stairs-Number of Steps: 3 Home Layout: One level Home Equipment: Cane - single point      Prior Function Level of Independence: Independent         Comments: Was a little unsteady PTA per daughter     Hand Dominance  Extremity/Trunk Assessment   Upper Extremity Assessment Upper Extremity Assessment: Defer to OT evaluation    Lower Extremity Assessment Lower Extremity Assessment: Generalized weakness    Cervical / Trunk Assessment Cervical / Trunk Assessment: Normal  Communication   Communication: No difficulties  Cognition  Arousal/Alertness: Awake/alert Behavior During Therapy: Flat affect Overall Cognitive Status: Within Functional Limits for tasks assessed                      General Comments      Exercises     Assessment/Plan    PT Assessment Patient needs continued PT services  PT Problem List Decreased strength;Decreased activity tolerance;Decreased range of motion;Decreased balance;Decreased mobility;Decreased knowledge of use of DME;Decreased safety awareness;Decreased knowledge of precautions;Pain       PT Treatment Interventions DME instruction;Gait training;Functional mobility training;Therapeutic activities;Stair training;Therapeutic exercise;Balance training;Patient/family education    PT Goals (Current goals can be found in the Care Plan section)  Acute Rehab PT Goals Patient Stated Goal: to go home after SNF PT Goal Formulation: With patient Time For Goal Achievement: 02/20/17 Potential to Achieve Goals: Good    Frequency Min 5X/week   Barriers to discharge Decreased caregiver support      Co-evaluation               End of Session Equipment Utilized During Treatment: Gait belt;Oxygen Activity Tolerance: Patient limited by fatigue;Patient limited by pain Patient left: in chair;with call bell/phone within reach;with chair alarm set;with family/visitor present Nurse Communication: Mobility status PT Visit Diagnosis: Muscle weakness (generalized) (M62.81);Unsteadiness on feet (R26.81)         Time: 5956-3875 PT Time Calculation (min) (ACUTE ONLY): 28 min   Charges:   PT Evaluation $PT Eval Moderate Complexity: 1 Procedure PT Treatments $Therapeutic Activity: 8-22 mins   PT G Codes:         Godfrey Pick Kinsey Karch February 24, 2017, 3:25 PM  [facility] Acute Rehabilitation [phone] (pager)

## 2017-02-06 NOTE — Discharge Instructions (Signed)
Ice to the shoulder as much as possible.  Keep the shoulder incision clean and dry and covered for one week, then ok to get it wet in the shower.  Do exercises as instructed 4-5 times per day  Keep a pillow propped behind the left elbow to keep arm across the waist  Follow up in two weeks with Dr Veverly Fells [phone]

## 2017-02-06 NOTE — Anesthesia Postprocedure Evaluation (Signed)
Anesthesia Post Note  Patient: [name]  Procedure(s) Performed: Procedure(s) (LRB): TOTAL SHOULDER ARTHROPLASTY (Left)  Patient location during evaluation: PACU Anesthesia Type: General Level of consciousness: awake Pain management: pain level controlled Vital Signs Assessment: post-procedure vital signs reviewed and stable Respiratory status: spontaneous breathing Cardiovascular status: stable Postop Assessment: no signs of nausea or vomiting Anesthetic complications: no Comments: Moving left hand well       Last Vitals:  Vitals:   02/06/17 1000 02/06/17 1010  BP:  (!) 141/75  Pulse: 88 78  Resp: 15 17  Temp:      Last Pain:  Vitals:   02/06/17 1000  TempSrc:   PainSc: 10-Worst pain ever                 [employer]

## 2017-02-06 NOTE — Interval H&P Note (Signed)
History and Physical Interval Note:  02/06/2017 7:37 AM  [name]  has presented today for surgery, with the diagnosis of left shoulder osteoarthritis  The various methods of treatment have been discussed with the patient and family. After consideration of risks, benefits and other options for treatment, the patient has consented to  Procedure(s): TOTAL SHOULDER ARTHROPLASTY (Left) as a surgical intervention .  The patient's history has been reviewed, patient examined, no change in status, stable for surgery.  I have reviewed the patient's chart and labs.  Questions were answered to the patient's satisfaction.     Tangela Dolliver,STEVEN R

## 2017-02-06 NOTE — Anesthesia Procedure Notes (Signed)
Procedure Name: Intubation Date/Time: 02/06/2017 7:47 AM Performed by: Kyung Rudd Pre-anesthesia Checklist: Patient identified, Emergency Drugs available, Suction available and Patient being monitored Patient Re-evaluated:Patient Re-evaluated prior to inductionOxygen Delivery Method: Circle system utilized Preoxygenation: Pre-oxygenation with 100% oxygen Intubation Type: IV induction Ventilation: Mask ventilation without difficulty Laryngoscope Size: Mac and 3 Grade View: Grade II Tube type: Oral Tube size: 7.0 mm Number of attempts: 1 Airway Equipment and Method: Stylet Placement Confirmation: ETT inserted through vocal cords under direct vision,  positive ETCO2 and breath sounds checked- equal and bilateral Secured at: 21 cm Tube secured with: Tape Dental Injury: Teeth and Oropharynx as per pre-operative assessment

## 2017-02-06 NOTE — Transfer of Care (Signed)
Immediate Anesthesia Transfer of Care Note  Patient: [name]  Procedure(s) Performed: Procedure(s): TOTAL SHOULDER ARTHROPLASTY (Left)  Patient Location: PACU  Anesthesia Type:GA combined with regional for post-op pain  Level of Consciousness: awake, alert  and oriented  Airway & Oxygen Therapy: Patient Spontanous Breathing and Patient connected to nasal cannula oxygen  Post-op Assessment: Report given to RN, Post -op Vital signs reviewed and stable and Patient moving all extremities X 4  Post vital signs: Reviewed and stable  Last Vitals:  Vitals:   02/06/17 0558  BP: (!) 144/69  Pulse: 69  Resp: 18  Temp: 36.6 C    Last Pain:  Vitals:   02/06/17 0558  TempSrc: Oral      Patients Stated Pain Goal: 3 (00/37/94 4461)  Complications: No apparent anesthesia complications

## 2017-02-06 NOTE — Anesthesia Procedure Notes (Addendum)
Anesthesia Regional Block: Interscalene brachial plexus block   Pre-Anesthetic Checklist: ,, timeout performed, Correct Patient, Correct Site, Correct Laterality, Correct Procedure, Correct Position, site marked, Risks and benefits discussed,  Surgical consent,  Pre-op evaluation,  At surgeon's request and post-op pain management  Laterality: Left  Prep: chloraprep       Needles:  Injection technique: Single-shot  Needle Type: Echogenic Stimulator Needle     Needle Length: 5cm  Needle Gauge: 22     Additional Needles:   Procedures: ultrasound guided,,,,,,,,   Nerve Stimulator or Paresthesia:  Response: 0.5 mA,   Additional Responses:   Narrative:  Start time: 02/06/2017 7:10 AM End time: 02/06/2017 7:20 AM Anesthesiologist: Jolly Mango

## 2017-02-06 NOTE — Brief Op Note (Signed)
02/06/2017  9:54 AM  PATIENT:  [name]  71 y.o. female  PRE-OPERATIVE DIAGNOSIS:  left shoulder osteoarthritis, end stage  POST-OPERATIVE DIAGNOSIS:  left shoulder osteoarthritis, end stage  PROCEDURE:  Procedure(s): TOTAL SHOULDER ARTHROPLASTY (Left) DePuy [employer]  SURGEON:  Surgeon(s) and Role:    * [name], MD - Primary  PHYSICIAN ASSISTANT:   ASSISTANTS: Ventura Bruns, PA-C  ANESTHESIA:   regional and general  EBL:  Total I/O In: 700 [I.V.:700] Out: 150 [Blood:150]  BLOOD ADMINISTERED:none  DRAINS: none   LOCAL MEDICATIONS USED:  MARCAINE     SPECIMEN:  No Specimen  DISPOSITION OF SPECIMEN:  N/A  COUNTS:  YES  TOURNIQUET:  * No tourniquets in log *  DICTATION: .[id number]  PLAN OF CARE: Admit to inpatient   PATIENT DISPOSITION:  PACU - hemodynamically stable.   Delay start of Pharmacological VTE agent (>24hrs) due to surgical blood loss or risk of bleeding: no

## 2017-02-06 NOTE — Op Note (Signed)
NAMEKAYIN, KETTERING NO.:  [id number]  MEDICAL RECORD NO.:  [id number]  LOCATION:                                 FACILITY:  PHYSICIAN:  Doran Heater. Veverly Fells, M.D.      DATE OF BIRTH:  DATE OF PROCEDURE:  02/06/2017 DATE OF DISCHARGE:                              OPERATIVE REPORT   PREOPERATIVE DIAGNOSIS:  Left shoulder end-stage osteoarthritis.  POSTOPERATIVE DIAGNOSIS:  Left shoulder end-stage osteoarthritis.  PROCEDURE PERFORMED:  Left shoulder total shoulder arthroplasty using the [place].  ATTENDING SURGEON:  Doran Heater. Veverly Fells, MD.  ASSISTANT:  Abbott Pao. Dixon, PAC, who has scrubbed the entire procedure and necessary for satisfactory completion of surgery.  ANESTHESIA:  General anesthesia was used plus interscalene block.  ESTIMATED BLOOD LOSS:  Under 100 mL.  FLUID REPLACEMENT:  1000 mL crystalloid.  INSTRUMENT COUNTS:  Correct  COMPLICATIONS:  There were no complications.  ANTIBIOTICS:  Perioperative antibiotics were given.  INDICATIONS:  The patient is a 71-year-old female with worsening left shoulder pain secondary to end-stage osteoarthritis.  The patient has bone-on-bone on x-ray.  MRI indicating intact rotator cuff.  We discussed options for management of the patient given her declining function and increasing pain despite conservative management including injections, modification of activity, pain medications, and therapy. The patient presents now for standard anatomic shoulder replacement using the DePuy system.  Risks and benefits of surgery were discussed. Informed consent obtained.  DESCRIPTION OF PROCEDURE:  After an adequate level of anesthesia achieved, the patient was positioned in the modified beach-chair position.  Left shoulder correctly identified, sterilely prepped and draped in usual manner.  Time-out called.  Initial approach through a deltopectoral incision started at the coracoid process, extending  down to the anterior humerus, dissection down through subcutaneous tissues using Bovie, identified the cephalic vein, took it laterally to the deltoid, pectoralis taken medially.  Conjoint tendon identified and retracted medially.  Biceps tenodesed in situ using 0 Vicryl figure-of- eight suture into the pectoralis tendon insertion into the biceps tendon.  We then released the subscapularis subperiosteally off the lesser tuberosity, progressively externally rotating, and releasing inferior capsule.  We then placed #2 FiberWire suture in a modified Mason-Allen suture technique into the free end of the subscapularis tendon for repair at the end of surgery.  We then placed our Crego elevators over the dorsal aspect of the humeral head underneath the rotator cuff insertion and then a large Crego inferiorly as well as reverse Hohmann.  We then placed the elbow at the patient's side, externally rotated the forearm about 30 degrees for a retroverted cut. We used an oscillating saw and made our cut at the appropriate level right at the rotator cuff insertion.  Pleased with that cut, we removed excess osteophytes using a rongeur.  We then placed the shoulder in extension.  We then reamed up to a size 10 diameter for the shaft.  We then used the broach system for the 8 and then the [place].  Once we had that broach completed, we placed our trial 10 stem and it fit nicely.  We then removed the trial stem.  We subluxed  the humerus posteriorly.  We then removed the biceps tendon intra-articular portion and the labrum 360 degrees with capsular release.  We were careful to protect the axillary nerve.  The rotator cuff was intact.  A little thin in the supraspinatus, but better posteriorly, but certainly intact.  We then went ahead and found our center point for our glenoid face.  We placed our centered guide pin and then reamed for the 40 glenoid.  We did our peripheral hand reaming.  We  then drilled our central PEG hole, placed our peripheral hole guide and then drilled our superior and 2 inferior PEG holes centered on the inferior axis of the scapula.  Once we had those drill holes in place, we trialed our 40 anchor PEG glenoid, impacted that in position and had good bony support and we had good bone around our inferior pegs.  We then vacuum mixed cement on the back table.  We dried our peripheral PEG holes with Gel- Foam soaked in epinephrine and then we went ahead and cemented the glenoid and anchor PEG glenoid in place.  This was a 40 DePuy anchor PEG glenoid, just cement in the peripheral 3 holes.  Once so we allowed the cement to harden on the back table, we then went ahead and checked the security of the glenoid, it was secure and then we drilled holes in her lesser tuberosity for placement of #2 FiberWire suture for repair of the subscapularis anatomically.  We then used impaction bone grafting technique and press-fit our stem.  This is a size [place] with a 10 metaphysis impacted in about 25-30 degrees of retroversion.  Once that was in position, we then went ahead and trialed with a 44 x 18 eccentric humeral head.  This gave [place] excellent bony coverage and excellent balance and stability.  We could translate the humerus posteriorly 50%, inferiorly 50%, which was ideal.  We removed the trial head and then selected a 44 x 18 eccentric head and impacted that in position, dialing that eccentricity superiorly and posteriorly.  We had good coverage, reduced the shoulder nice and stable.  We then repaired the subscapularis anatomically back to bone after thorough irrigation, we irrigated again, repaired deltopectoral interval with 0 Vicryl suture, followed by 2-0 Vicryl subcutaneous closure, and 4-0 Monocryl for skin.  Steri-Strips applied followed by sterile dressing.  The patient tolerated the surgery well.     Doran Heater. Veverly Fells, M.D.     SRN/MEDQ  D:   02/06/2017  T:  02/06/2017  Job:  [id number]

## 2017-02-07 LAB — BASIC METABOLIC PANEL
ANION GAP: 6 (ref 5–15)
BUN: 11 mg/dL (ref 6–20)
CALCIUM: 8.7 mg/dL — AB (ref 8.9–10.3)
CO2: 28 mmol/L (ref 22–32)
CREATININE: 0.85 mg/dL (ref 0.44–1.00)
Chloride: 104 mmol/L (ref 101–111)
GFR calc non Af Amer: >60 mL/min (ref >60)
Glucose, Bld: 101 mg/dL — ABNORMAL HIGH (ref 65–99)
Potassium: 3.2 mmol/L — ABNORMAL LOW (ref 3.5–5.1)
SODIUM: 138 mmol/L (ref 135–145)

## 2017-02-07 LAB — HEMOGLOBIN AND HEMATOCRIT, BLOOD
HCT: 34.7 % — ABNORMAL LOW (ref 36.0–46.0)
HEMOGLOBIN: 11.3 g/dL — AB (ref 12.0–15.0)

## 2017-02-07 MED ORDER — SODIUM CHLORIDE 0.45 % IV SOLN
INTRAVENOUS | Status: DC
  Administered 2017-02-07: 14:00:00 via INTRAVENOUS
  Filled 2017-02-07 (×3): qty 1000

## 2017-02-07 NOTE — Progress Notes (Signed)
Physical Therapy Treatment Patient Details Name: [name] MRN: [id number] DOB: May 28, 1946 Today's Date: 02/07/2017    History of Present Illness Pt is a 71 y.o. female complaining of left shoulder pain for multiple years.   PMH: anxiety, HTN.  Admit for left total shoulder arthroplasty.    PT Comments    Pt admitted with above diagnosis. Pt currently with functional limitations due to balance and endurance deficits. Pt was able to ambulate with one UE support with min assist in controlled environment without challenges to balance. Pt will need SNF as she cannot care for herself at current level.   Pt will benefit from skilled PT to increase their independence and safety with mobility to allow discharge to the venue listed below.     Follow Up Recommendations  SNF;Supervision/Assistance - 24 hour     Equipment Recommendations  Other (comment) (TBA)    Recommendations for Other Services       Precautions / Restrictions Precautions Precautions: Shoulder Type of Shoulder Precautions: conservative Shoulder Interventions: Shoulder sling/immobilizer;At all times Precaution Booklet Issued: Yes (comment) Precaution Comments: AROM elbow, wrist and hand to tolerance Required Braces or Orthoses: Sling Restrictions Weight Bearing Restrictions: Yes LUE Weight Bearing: Non weight bearing    Mobility  Bed Mobility Overal bed mobility: Needs Assistance Bed Mobility: Supine to Sit     Supine to sit: Mod assist;Min assist     General bed mobility comments: mod assist extra time and effort. HOB elevated. Limited due to pain  Transfers Overall transfer level: Needs assistance Equipment used: 1 person hand held assist Transfers: Sit to/from Stand Sit to Stand: Min assist Stand pivot transfers: Min assist       General transfer comment: min powerup and steadying assist.   Ambulation/Gait Ambulation/Gait assistance: Min guard;Min assist;Mod assist Ambulation Distance  (Feet): 150 Feet Assistive device: 1 person hand held assist Gait Pattern/deviations: Step-through pattern;Decreased stride length;Trunk flexed;Wide base of support;Drifts right/left   Gait velocity interpretation: Below normal speed for age/gender General Gait Details: Pt was able to ambulate in hallway. Preferred HHA vs. pushing IV pole.  Pt didnt lose balance as long as she had UE support but could not balance without UE support. Pt cannot withstand challegnes to balance and generally unsteady without UE support.     Stairs            Wheelchair Mobility    Modified Rankin (Stroke Patients Only)       Balance Overall balance assessment: Needs assistance;History of Falls Sitting-balance support: No upper extremity supported;Feet supported Sitting balance-Leahy Scale: Fair     Standing balance support: Single extremity supported;During functional activity Standing balance-Leahy Scale: Poor Standing balance comment: relies on single UE support for balance in standing. Washed hands at sink and brushed teeth needing steadying assist when her UE was not supported.               High level balance activites: Direction changes;Turns;Sudden stops High Level Balance Comments: Needed min assist for higher level balance.    Cognition Arousal/Alertness: Awake/alert Behavior During Therapy: WFL for tasks assessed/performed Overall Cognitive Status: Within Functional Limits for tasks assessed                      Exercises      General Comments        Pertinent Vitals/Pain Pain Assessment: Faces Faces Pain Scale: Hurts even more Pain Location: shoulder Pain Descriptors / Indicators: Aching;Grimacing;Guarding;Operative site guarding Pain Intervention(s): Limited activity  within patient's tolerance;Monitored during session;Premedicated before session;Repositioned  VSS  Home Living                      Prior Function            PT Goals (current  goals can now be found in the care plan section) Acute Rehab PT Goals Patient Stated Goal: to go home after SNF Progress towards PT goals: Progressing toward goals    Frequency    Min 5X/week      PT Plan Current plan remains appropriate    Co-evaluation             End of Session Equipment Utilized During Treatment: Gait belt Activity Tolerance: Patient limited by fatigue;Patient limited by pain Patient left: in chair;with call bell/phone within reach;with chair alarm set;with family/visitor present Nurse Communication: Mobility status PT Visit Diagnosis: Muscle weakness (generalized) (M62.81);Unsteadiness on feet (R26.81)     Time: 2458-0998 PT Time Calculation (min) (ACUTE ONLY): 26 min  Charges:  $Gait Training: 23-37 mins                    G Codes:       Denice Paradise 03/09/17, 4:31 PM [employer] Acute Rehabilitation [phone] (pager)

## 2017-02-07 NOTE — Evaluation (Signed)
Occupational Therapy Evaluation Patient Details Name: [name] MRN: [id number] DOB: 04-24-1946 Today's Date: 02/07/2017    History of Present Illness Pt is a 71 y.o. female complaining of left shoulder pain for multiple years.   PMH: anxiety, HTN.  Admit for left total shoulder arthroplasty.   Clinical Impression   Pt admitted with the above diagnoses and presents with below problem list. Pt will benefit from continued OT to address the below listed deficits and maximize independence with basic ADLs prior to d/c to next venue. PTA pt was independent with ADLs. Pt is currently min to mod A with most ADLs. Min A for functional transfers and mobility. Pt reporting heaviness in LUE. Limited AROM L elbow due to tightness/pain. Encouraged pt to try elbow ROM again once premedicated.        Follow Up Recommendations  SNF    Equipment Recommendations  Other (comment) (defer to next venue)    Recommendations for Other Services       Precautions / Restrictions Precautions Precautions: Shoulder Type of Shoulder Precautions: conservative Shoulder Interventions: Shoulder sling/immobilizer;At all times Precaution Booklet Issued: Yes (comment) Precaution Comments: AROM elbow, wrist and hand to tolerance Required Braces or Orthoses: Sling Restrictions Weight Bearing Restrictions: Yes LUE Weight Bearing: Non weight bearing      Mobility Bed Mobility Overal bed mobility: Needs Assistance Bed Mobility: Sit to Supine       Sit to supine: Min guard;HOB elevated   General bed mobility comments: min guard for safety. extra time and effort. HOB elevated.  Transfers Overall transfer level: Needs assistance Equipment used: 1 person hand held assist Transfers: Sit to/from Stand Sit to Stand: Min assist         General transfer comment: min powerup and steadying assist.     Balance Overall balance assessment: Needs assistance;History of Falls Sitting-balance support: No upper  extremity supported;Feet supported Sitting balance-Leahy Scale: Fair     Standing balance support: Single extremity supported;During functional activity Standing balance-Leahy Scale: Poor                              ADL Overall ADL's : Needs assistance/impaired Eating/Feeding: Set up;Sitting   Grooming: Set up;Minimal assistance;Sitting   Upper Body Bathing: Minimal assistance;Sitting   Lower Body Bathing: Minimal assistance;Sit to/from stand   Upper Body Dressing : Sitting;Moderate assistance   Lower Body Dressing: Moderate assistance;Sit to/from stand   Toilet Transfer: Minimal assistance;Ambulation   Toileting- Clothing Manipulation and Hygiene: Minimal assistance;Sit to/from stand;Moderate assistance       Functional mobility during ADLs: Minimal assistance General ADL Comments: Pt completed in-room functional mobility and bed mobility. Shoulder education including handout provided to pt.      Vision         Perception     Praxis      Pertinent Vitals/Pain Pain Assessment: Faces Faces Pain Scale: Hurts even more Pain Location: shoulder Pain Descriptors / Indicators: Aching;Grimacing;Guarding;Operative site guarding Pain Intervention(s): Limited activity within patient's tolerance;Monitored during session;Patient requesting pain meds-RN notified;RN gave pain meds during session     Hand Dominance Right   Extremity/Trunk Assessment Upper Extremity Assessment Upper Extremity Assessment: LUE deficits/detail LUE Deficits / Details: limited AROM L elbow, tight pain limiting ROM LUE: Unable to fully assess due to pain   Lower Extremity Assessment Lower Extremity Assessment: Defer to PT evaluation   Cervical / Trunk Assessment Cervical / Trunk Assessment: Normal   Communication Communication Communication:  No difficulties   Cognition Arousal/Alertness: Awake/alert Behavior During Therapy: WFL for tasks assessed/performed Overall Cognitive  Status: Within Functional Limits for tasks assessed                     General Comments       Exercises Exercises: Other exercises Other Exercises Other Exercises: elbow/wrist/hand return demonstration. Tightness in elbow limiting AROM.    Shoulder Instructions      Home Living Family/patient expects to be discharged to:: Skilled nursing facility Living Arrangements: Alone Available Help at Discharge: Family;Available PRN/intermittently Type of Home: House Home Access: Stairs to enter [employer] of Steps: 3 Entrance Stairs-Rails: Right Home Layout: One level     Bathroom Shower/Tub: Occupational psychologist: Handicapped height Bathroom Accessibility: Yes   Home Equipment: [employer] - single point          Prior Functioning/Environment Level of Independence: Independent        Comments: Was a little unsteady PTA per daughter        OT Problem List: Impaired balance (sitting and/or standing);Decreased knowledge of use of DME or AE;Decreased knowledge of precautions;Impaired UE functional use;Pain      OT Treatment/Interventions: Self-care/ADL training;DME and/or AE instruction;Therapeutic activities;Patient/family education;Balance training    OT Goals(Current goals can be found in the care plan section) Acute Rehab OT Goals Patient Stated Goal: to go home after SNF OT Goal Formulation: With patient Time For Goal Achievement: 02/14/17 Potential to Achieve Goals: Good ADL Goals Pt Will Perform Upper Body Bathing: with min guard assist;sitting Pt Will Perform Lower Body Bathing: with min guard assist;sit to/from stand Pt Will Perform Upper Body Dressing: with min guard assist;sitting Pt Will Perform Lower Body Dressing: with min guard assist;sit to/from stand Pt Will Transfer to Toilet: with min guard assist;ambulating Pt Will Perform Toileting - Clothing Manipulation and hygiene: with min guard assist;sit to/from stand Pt/caregiver will  Perform Home Exercise Program: Left upper extremity;With written HEP provided;Independently (e/w/h ROM only)  OT Frequency: Min 3X/week   Barriers to D/C:            Co-evaluation              End of Session Equipment Utilized During Treatment: Other (comment) (sling) Nurse Communication: Patient requests pain meds  Activity Tolerance: Patient limited by pain;Patient tolerated treatment well Patient left: in bed;with call bell/phone within reach;with bed alarm set  OT Visit Diagnosis: Pain;Unsteadiness on feet (R26.81) Pain - Right/Left: Left Pain - part of body: Shoulder                ADL either performed or assessed with clinical judgement  Time: 9233-0076 OT Time Calculation (min): 30 min Charges:  OT General Charges $OT Visit: 1 Procedure OT Evaluation $OT Eval Low Complexity: 1 Procedure OT Treatments $Self Care/Home Management : 8-22 mins G-Codes:       Hortencia Pilar 02/07/2017, 10:27 AM

## 2017-02-07 NOTE — NC FL2 (Signed)
[place] MEDICAID FL2 LEVEL OF CARE SCREENING TOOL     IDENTIFICATION  Patient Name: [name] Birthdate: 09/03/1946 Sex: female Admission Date (Current Location): 02/06/2017  [facility] and [place] Number:  Herbalist and Address:  [facility] [place], [place] [address] [place], [place] [address]      Provider Number: [id number]  Attending Physician Name and Address:  [name], MD  Relative Name and Phone Number:       Current Level of Care: Hospital Recommended Level of Care: [place] Prior Approval Number:    Date Approved/Denied:   PASRR Number: [id number]  Discharge Plan: SNF    Current Diagnoses: Patient Active Problem List   Diagnosis Date Noted  . S/P shoulder replacement, left 02/06/2017  . Elevated transaminase level 01/25/2013  . GERD (gastroesophageal reflux disease) 01/25/2013  . Blood in stool 06/15/2012  . Hypertension 06/15/2012  . High cholesterol 06/15/2012    Orientation RESPIRATION BLADDER Height & Weight     Self, Time, Situation, Place  Normal Continent Weight: 186 lb (84.4 kg) Height:  5\' 1"  (154.9 cm)  BEHAVIORAL SYMPTOMS/MOOD NEUROLOGICAL BOWEL NUTRITION STATUS      Continent Diet  AMBULATORY STATUS COMMUNICATION OF NEEDS Skin   Limited Assist Verbally Normal                       Personal Care Assistance Level of Assistance  Bathing, Feeding, Dressing Bathing Assistance: Limited assistance Feeding assistance: Limited assistance Dressing Assistance: Maximum assistance     Functional Limitations Info  Sight, Hearing, Speech Sight Info: Adequate Hearing Info: Adequate Speech Info: Adequate    SPECIAL CARE FACTORS FREQUENCY  PT (By licensed PT), OT (By licensed OT)     PT Frequency: 5x OT Frequency: 3x            Contractures Contractures Info: Not present    Additional Factors Info  Code Status, Allergies, Psychotropic Code Status Info: Full Allergies Info:  Latex, Penicillins, Sulfa Antibiotics Psychotropic Info: citalopram (CELEXA)         Current Medications (02/07/2017):  This is the current hospital active medication list Current Facility-Administered Medications  Medication Dose Route Frequency Provider Last Rate Last Dose  . acetaminophen (TYLENOL) tablet 650 mg  650 mg Oral Q6H PRN [name], MD       Or  . acetaminophen (TYLENOL) suppository 650 mg  650 mg Rectal Q6H PRN [name], MD      . acetaminophen (TYLENOL) tablet 1,000 mg  1,000 mg Oral Q6H PRN [name], MD      . ALPRAZolam Duanne Moron) tablet 0.25 mg  0.25 mg Oral Daily PRN [name], MD      . bisacodyl (DULCOLAX) suppository 10 mg  10 mg Rectal Daily PRN [name], MD      . calcium carbonate (OSCAL) tablet 1,500 mg  1,500 mg Oral Q breakfast [name], MD   1,500 mg at 02/07/17 0849  . cholecalciferol (VITAMIN D) tablet 2,000 Units  2,000 Units Oral Daily [name], MD   2,000 Units at 02/07/17 1125  . citalopram (CELEXA) tablet 20 mg  20 mg Oral QPM [name], MD   20 mg at 02/06/17 2211  . docusate sodium (COLACE) capsule 100 mg  100 mg Oral BID [name], MD   100 mg at 02/07/17 1125  . doxazosin (CARDURA) tablet 2 mg  2 mg Oral QHS [name], MD   2 mg  at 02/06/17 2207  . irbesartan (AVAPRO) tablet 300 mg  300 mg Oral Daily [name], MD   300 mg at 02/07/17 1125   And  . hydrochlorothiazide (MICROZIDE) capsule 12.5 mg  12.5 mg Oral Daily [name], MD   12.5 mg at 02/07/17 1125  . HYDROcodone-acetaminophen (NORCO/VICODIN) 5-325 MG per tablet 1-2 tablet  1-2 tablet Oral Q4H PRN [name], MD   2 tablet at 02/07/17 1418  . levothyroxine (SYNTHROID, LEVOTHROID) tablet 75 mcg  75 mcg Oral QAC breakfast [name], MD   75 mcg at 02/07/17 0849  . loratadine (CLARITIN) tablet 10 mg  10 mg Oral Daily [name], MD   10 mg at 02/07/17 1125  . magnesium oxide (MAG-OX) tablet 400 mg  400 mg Oral Daily [name], MD   400 mg at 02/07/17  1125  . menthol-cetylpyridinium (CEPACOL) lozenge 3 mg  1 lozenge Oral PRN [name], MD       Or  . phenol [facility]) mouth spray 1 spray  1 spray Mouth/Throat PRN [name], MD      . metoCLOPramide (REGLAN) tablet 5-10 mg  5-10 mg Oral Q8H PRN [name], MD       Or  . metoCLOPramide (REGLAN) injection 5-10 mg  5-10 mg Intravenous Q8H PRN [name], MD   10 mg at 02/06/17 1156  . morphine 2 MG/ML injection 2 mg  2 mg Intravenous Q2H PRN [name], MD   2 mg at 02/06/17 1156  . ondansetron (ZOFRAN) tablet 4 mg  4 mg Oral Q6H PRN [name], MD       Or  . ondansetron [facility]) injection 4 mg  4 mg Intravenous Q6H PRN [name], MD   4 mg at 02/07/17 1418  . polyethylene glycol (MIRALAX / GLYCOLAX) packet 17 g  17 g Oral Daily PRN [name], MD      . simvastatin (ZOCOR) tablet 20 mg  20 mg Oral QPM [name], MD   20 mg at 02/06/17 2207  . sodium chloride 0.45 % 1,000 mL with potassium chloride 10 mEq infusion   Intravenous Continuous Cecilie Kicks, PA-C 50 mL/hr at 02/07/17 1418       Discharge Medications: Please see discharge summary for a list of discharge medications.  Relevant Imaging Results:  Relevant Lab Results:   Additional Information SSN: [id number]  Truitt Merle, LCSW

## 2017-02-07 NOTE — Progress Notes (Signed)
Subjective: 1 Day Post-Op Procedure(s) (LRB): TOTAL SHOULDER ARTHROPLASTY (Left) Patient reports pain as moderate.   Reports pain at L shoulder and stiffness in elbow. Therapy working with her this morning while I am rounding. No numbness or tingling. Reports L arm feels very heavy. She is not sure about going home and is wondering about her rehab options. Does not feel ready for D/C today.  Objective: Vital signs in last 24 hours: Temp:  [97.9 F (36.6 C)-98.9 F (37.2 C)] 98.9 F (37.2 C) (03/17 0700) Pulse Rate:  [69-91] 82 (03/17 0700) Resp:  [15-22] 15 (03/16 1140) BP: (99-147)/(45-75) 113/51 (03/17 0700) SpO2:  [92 %-100 %] 97 % (03/17 0700)  Intake/Output from previous day: 03/16 0701 - 03/17 0700 In: 700 [I.V.:700] Out: 150 [Blood:150] Intake/Output this shift: No intake/output data recorded.   Recent Labs  02/07/17 0340  HGB 11.3*    Recent Labs  02/07/17 0340  HCT 34.7*    Recent Labs  02/07/17 0340  NA 138  K 3.2*  CL 104  CO2 28  BUN 11  CREATININE 0.85  GLUCOSE 101*  CALCIUM 8.7*   No results for input(s): LABPT, INR in the last 72 hours.  Neurologically intact ABD soft Neurovascular intact Sensation intact distally Intact pulses distally Dorsiflexion/Plantar flexion intact Incision: dressing C/D/I and no drainage No cellulitis present Compartment soft no sign of DVT  Assessment/Plan: 1 Day Post-Op Procedure(s) (LRB): TOTAL SHOULDER ARTHROPLASTY (Left) Advance diet Up with therapy D/C IV fluids Slightly hypokalemic this AM will switch IVF to 1/2NS with 82mEqK and KVO when taking PO fluids well Recheck BMP tomorrow Social work consult placed for possible rehab placement If pain well controlled tomorrow may be able to D/C home Awaiting therapy recommendations regarding D/C dispo  BISSELL, JACLYN M. 02/07/2017, 10:02 AM

## 2017-02-07 NOTE — Clinical Social Work Note (Signed)
Clinical Social Work Assessment  Patient Details  Name: [name] MRN: [id number] Date of Birth: April 29, 1946  Date of referral:  02/07/17               Reason for consult:  Discharge Planning                Permission sought to share information with:  Family Supports Permission granted to share information::  Yes, Verbal Permission Granted  Name::     [name]  Agency::  SNFs  Relationship::  Daughter  Contact Information:  [phone]  Housing/Transportation Living arrangements for the past 2 months:  Single Family Home Source of Information:  Patient, Adult Children Patient Interpreter Needed:  None Criminal Activity/Legal Involvement Pertinent to Current Situation/Hospitalization:  No - Comment as needed Significant Relationships:  Adult Children, Other Family Members Lives with:  Self Do you feel safe going back to the place where you live?  Yes Need for family participation in patient care:  Yes (Comment)  Care giving concerns:  No caregiving concerns identified.    Social Worker assessment / plan: CSW met with pt to address consult for new SNF. Daughter-Jessica, niece-Lisa, and granddaughter at bedside. Pt working with P/T upon arrival. CSW introduced herself and explained role of social work. P/T and O/T recommending STR at SNF. CSW provided SNF listing for review. Pt agreeable to SNF for STR. Pt prefers [facility] as first choice and [facility] as second choice. Pt is not considering any other facilities and did not want CSW to fax out to other facilities.    CSW will send FL-2 to SNFs. Unit CSW to follow up on potential bed offer. CSW will continue to follow.   Employment status:  Retired Forensic scientist:  Medicare PT Recommendations:  [place] / Referral to community resources:  [place]  Patient/Family's Response to care:  Pt and family thankful and appreciative of CSW support and guidance.    Patient/Family's Understanding of and Emotional Response to Diagnosis, Current Treatment, and Prognosis:  Pt and family understand pt will benefit from STR prior to returning home. Pt and family in agreement with STR at SNF.   Emotional Assessment Appearance:  Appears stated age Attitude/Demeanor/Rapport:   (Appropriate) Affect (typically observed):  Accepting, Pleasant Orientation:  Oriented to Place, Oriented to  Time, Oriented to Situation, Oriented to Self Alcohol / Substance use:  Other Psych involvement (Current and /or in the community):  No (Comment)  Discharge Needs  Concerns to be addressed:  Care Coordination Readmission within the last 30 days:  No Current discharge risk:  Dependent with Mobility, Lives alone Barriers to Discharge:  Continued Medical Work up   [employer], LCSW 02/07/2017, 5:00 PM

## 2017-02-07 NOTE — [place] FL2 (Signed)
[place] MEDICAID FL2 LEVEL OF CARE SCREENING TOOL     IDENTIFICATION  Patient Name: [name] Birthdate: [birth year] Sex: female Admission Date (Current Location): 02/06/2017  [facility] and [place] Number:  Herbalist and Address:  The . [facility], [place] [address] [place], [place] [address]      Provider Number: [id number]  Attending Physician Name and Address:  [name], MD  Relative Name and Phone Number:       Current Level of Care: Hospital Recommended Level of Care: Pax Prior Approval Number:    Date Approved/Denied:   PASRR Number: [id number]  Discharge Plan: SNF    Current Diagnoses: Patient Active Problem List   Diagnosis Date Noted  . S/P shoulder replacement, left 02/06/2017  . Elevated transaminase level 01/25/2013  . GERD (gastroesophageal reflux disease) 01/25/2013  . Blood in stool 06/15/2012  . Hypertension 06/15/2012  . High cholesterol 06/15/2012    Orientation RESPIRATION BLADDER Height & Weight     Self, Time, Situation, Place  Normal Continent Weight: 186 lb (84.4 kg) Height:  5\' 1"  (154.9 cm)  BEHAVIORAL SYMPTOMS/MOOD NEUROLOGICAL BOWEL NUTRITION STATUS      Continent Diet (Regular; thin liquids)  AMBULATORY STATUS COMMUNICATION OF NEEDS Skin   Limited Assist Verbally Normal                       Personal Care Assistance Level of Assistance  Bathing, Feeding, Dressing Bathing Assistance: Limited assistance Feeding assistance: Limited assistance Dressing Assistance: Maximum assistance     Functional Limitations Info  Sight, Hearing, Speech Sight Info: Adequate Hearing Info: Adequate Speech Info: Adequate    SPECIAL CARE FACTORS FREQUENCY  PT (By licensed PT), OT (By licensed OT)     PT Frequency: 5x OT Frequency: 3x            Contractures Contractures Info: Not present    Additional Factors Info  Code Status, Allergies, Psychotropic Code Status Info:  Full Allergies Info: Latex, Penicillins, Sulfa Antibiotics Psychotropic Info: citalopram (CELEXA)         Current Medications (02/07/2017):  This is the current hospital active medication list Current Facility-Administered Medications  Medication Dose Route Frequency Provider Last Rate Last Dose  . acetaminophen (TYLENOL) tablet 650 mg  650 mg Oral Q6H PRN [name], MD       Or  . acetaminophen (TYLENOL) suppository 650 mg  650 mg Rectal Q6H PRN [name], MD      . acetaminophen (TYLENOL) tablet 1,000 mg  1,000 mg Oral Q6H PRN [name], MD      . ALPRAZolam Duanne Moron) tablet 0.25 mg  0.25 mg Oral Daily PRN [name], MD      . bisacodyl (DULCOLAX) suppository 10 mg  10 mg Rectal Daily PRN [name], MD      . calcium carbonate (OSCAL) tablet 1,500 mg  1,500 mg Oral Q breakfast [name], MD   1,500 mg at 02/07/17 0849  . cholecalciferol (VITAMIN D) tablet 2,000 Units  2,000 Units Oral Daily [name], MD   2,000 Units at 02/07/17 1125  . citalopram (CELEXA) tablet 20 mg  20 mg Oral QPM [name], MD   20 mg at 02/06/17 2211  . docusate sodium (COLACE) capsule 100 mg  100 mg Oral BID [name], MD   100 mg at 02/07/17 1125  . doxazosin (CARDURA) tablet 2 mg  2 mg Oral QHS [name], MD  2 mg at 02/06/17 2207  . irbesartan (AVAPRO) tablet 300 mg  300 mg Oral Daily [name], MD   300 mg at 02/07/17 1125   And  . hydrochlorothiazide (MICROZIDE) capsule 12.5 mg  12.5 mg Oral Daily [name], MD   12.5 mg at 02/07/17 1125  . HYDROcodone-acetaminophen (NORCO/VICODIN) 5-325 MG per tablet 1-2 tablet  1-2 tablet Oral Q4H PRN [name], MD   2 tablet at 02/07/17 1418  . levothyroxine (SYNTHROID, LEVOTHROID) tablet 75 mcg  75 mcg Oral QAC breakfast [name], MD   75 mcg at 02/07/17 0849  . loratadine (CLARITIN) tablet 10 mg  10 mg Oral Daily [name], MD   10 mg at 02/07/17 1125  . magnesium oxide (MAG-OX) tablet 400 mg  400 mg Oral Daily [name],  MD   400 mg at 02/07/17 1125  . menthol-cetylpyridinium (CEPACOL) lozenge 3 mg  1 lozenge Oral PRN [name], MD       Or  . phenol [facility]) mouth spray 1 spray  1 spray Mouth/Throat PRN [name], MD      . metoCLOPramide (REGLAN) tablet 5-10 mg  5-10 mg Oral Q8H PRN [name], MD       Or  . metoCLOPramide (REGLAN) injection 5-10 mg  5-10 mg Intravenous Q8H PRN [name], MD   10 mg at 02/06/17 1156  . morphine 2 MG/ML injection 2 mg  2 mg Intravenous Q2H PRN [name], MD   2 mg at 02/06/17 1156  . ondansetron (ZOFRAN) tablet 4 mg  4 mg Oral Q6H PRN [name], MD       Or  . ondansetron [facility]) injection 4 mg  4 mg Intravenous Q6H PRN [name], MD   4 mg at 02/07/17 1418  . polyethylene glycol (MIRALAX / GLYCOLAX) packet 17 g  17 g Oral Daily PRN [name], MD      . simvastatin (ZOCOR) tablet 20 mg  20 mg Oral QPM [name], MD   20 mg at 02/06/17 2207  . sodium chloride 0.45 % 1,000 mL with potassium chloride 10 mEq infusion   Intravenous Continuous Cecilie Kicks, PA-C 50 mL/hr at 02/07/17 1418       Discharge Medications: Please see discharge summary for a list of discharge medications.  Relevant Imaging Results:  Relevant Lab Results:   Additional Information SSN: [id number]  Truitt Merle, LCSW

## 2017-02-07 NOTE — Clinical Social Work Placement (Signed)
   CLINICAL SOCIAL WORK PLACEMENT  NOTE  Date:  02/07/2017  Patient Details  Name: [name] MRN: [id number] Date of Birth: 1946-02-01  Clinical Social Work is seeking post-discharge placement for this patient at the [place] level of care (*CSW will initial, date and re-position this form in  chart as items are completed):  Yes   Patient/family provided with [place] Work Department's list of facilities offering this level of care within the geographic area requested by the patient (or if unable, by the patient's family).  Yes   Patient/family informed of their freedom to choose among providers that offer the needed level of care, that participate in Medicare, Medicaid or managed care program needed by the patient, have an available bed and are willing to accept the patient.  Yes   Patient/family informed of [facility]'s ownership interest in [facility] and [facility], as well as of the fact that they are under no obligation to receive care at these facilities.  PASRR submitted to EDS on 02/07/17     PASRR number received on 02/07/17     Existing PASRR number confirmed on       FL2 transmitted to all facilities in geographic area requested by pt/family on 02/07/17     FL2 transmitted to all facilities within larger geographic area on       Patient informed that his/her managed care company has contracts with or will negotiate with certain facilities, including the following:            Patient/family informed of bed offers received.  Patient chooses bed at       Physician recommends and patient chooses bed at      Patient to be transferred to   on  .  Patient to be transferred to facility by       Patient family notified on   of transfer.  Name of family member notified:        PHYSICIAN Please sign FL2     Additional Comment:    _______________________________________________ Truitt Merle, LCSW 02/07/2017,  5:30 PM

## 2017-02-08 LAB — BASIC METABOLIC PANEL
Anion gap: 5 (ref 5–15)
BUN: 11 mg/dL (ref 6–20)
CO2: 30 mmol/L (ref 22–32)
CREATININE: 0.79 mg/dL (ref 0.44–1.00)
Calcium: 8.8 mg/dL — ABNORMAL LOW (ref 8.9–10.3)
Chloride: 102 mmol/L (ref 101–111)
GFR calc Af Amer: >60 mL/min (ref >60)
GFR calc non Af Amer: >60 mL/min (ref >60)
Glucose, Bld: 102 mg/dL — ABNORMAL HIGH (ref 65–99)
Potassium: 3.7 mmol/L (ref 3.5–5.1)
Sodium: 137 mmol/L (ref 135–145)

## 2017-02-08 NOTE — Progress Notes (Signed)
   Subjective:  Patient reports pain as mild to moderate.  Denies N/V/CP/SOB.  Objective:   VITALS:   Vitals:   02/07/17 1500 02/07/17 2032 02/08/17 0700 02/08/17 0837  BP: (!) 127/51 (!) 126/49 (!) 133/50 (!) 141/54  Pulse: (!) 101 97 (!) 101   Resp:      Temp: 98.9 F (37.2 C) 99.6 F (37.6 C) (!) 100.7 F (38.2 C)   TempSrc: Oral Oral Oral   SpO2: 91% 92% 92%   Weight:      Height:        NAD ABD soft Neurovascular intact spling intact   Lab Results  Component Value Date   WBC 9.1 01/30/2017   HGB 11.3 (L) 02/07/2017   HCT 34.7 (L) 02/07/2017   MCV 92.9 01/30/2017   PLT 251 01/30/2017   BMET    Component Value Date/Time   NA 137 02/08/2017 0554   K 3.7 02/08/2017 0554   CL 102 02/08/2017 0554   CO2 30 02/08/2017 0554   GLUCOSE 102 (H) 02/08/2017 0554   BUN 11 02/08/2017 0554   CREATININE 0.79 02/08/2017 0554   CALCIUM 8.8 (L) 02/08/2017 0554   GFRNONAA >60 02/08/2017 0554   GFRAA >60 02/08/2017 0554     Assessment/Plan: 2 Days Post-Op   Active Problems:   S/P shoulder replacement, left   NWB LUE Sling at all times except for hygiene and therapy PO pain control Temp elevation: due to atelectasis, IS ordered - 10 breaths per hour Dispo: pt wants to go to [employer] [facility], Horald Pollen 02/08/2017, 9:58 AM   Rod Can, MD Cell [phone]

## 2017-02-08 NOTE — Progress Notes (Signed)
Occupational Therapy Treatment Patient Details Name: [name] MRN: [id number] DOB: 1946-03-31 Today's Date: 02/08/2017    History of present illness Pt is a 71 y.o. female complaining of left shoulder pain for multiple years.   PMH: anxiety, HTN.  Admit for left total shoulder arthroplasty.   OT comments  Pt progressing towards acute OT goals. Better performance with elbow extension today compared to yesterday. After a few reps able to achieve full elbow extension. Min A for transfers. Min guard-min A for functional mobility. Educated further on sling management. D/c plan to SNF for ST rehab still appropriate.   Follow Up Recommendations  SNF    Equipment Recommendations  Other (comment) (defer to next venue)    Recommendations for Other Services      Precautions / Restrictions Precautions Precautions: Shoulder Type of Shoulder Precautions: conservative Shoulder Interventions: Shoulder sling/immobilizer;At all times Precaution Booklet Issued: Yes (comment) Precaution Comments: AROM elbow, wrist and hand to tolerance Required Braces or Orthoses: Sling Restrictions Weight Bearing Restrictions: Yes LUE Weight Bearing: Non weight bearing       Mobility Bed Mobility               General bed mobility comments: up in chair  Transfers Overall transfer level: Needs assistance Equipment used: 1 person hand held assist Transfers: Sit to/from Stand Sit to Stand: Min assist         General transfer comment: min powerup and steadying assist.     Balance Overall balance assessment: Needs assistance;History of Falls         Standing balance support: No upper extremity supported Standing balance-Leahy Scale: Poor Standing balance comment: fair static, poor dynamic                   ADL Overall ADL's : Needs assistance/impaired                         Toilet Transfer: Minimal assistance;Ambulation Toilet Transfer Details (indicate cue type  and reason): simulated with transfer to recliner. utilized armrest on R side. Min A to power up.         Functional mobility during ADLs: Min guard;Minimal assistance General ADL Comments: Mostly min guard to walk in room with occasional light min A to steady. Min A to stand from recliner.      Vision                     Perception     Praxis      Cognition   Behavior During Therapy: [facility] for tasks assessed/performed Overall Cognitive Status: Within Functional Limits for tasks assessed                         Exercises Other Exercises Other Exercises: improved elbow ROM. Initially AAROM progressing to AROM. After a 2-3 reps able to achieve full elbow extension. Supine 7-8 reps   Shoulder Instructions       General Comments      Pertinent Vitals/ Pain       Pain Assessment: Faces Faces Pain Scale: Hurts even more Pain Location: shoulder during transfers Pain Descriptors / Indicators: Aching;Grimacing;Guarding;Operative site guarding Pain Intervention(s): Limited activity within patient's tolerance;Monitored during session;Repositioned;Ice applied  Home Living  Prior Functioning/Environment              Frequency  Min 3X/week        Progress Toward Goals  OT Goals(current goals can now be found in the care plan section)  Progress towards OT goals: Progressing toward goals  Acute Rehab OT Goals Patient Stated Goal: to go home after SNF OT Goal Formulation: With patient Time For Goal Achievement: 02/14/17 Potential to Achieve Goals: Good ADL Goals Pt Will Perform Upper Body Bathing: with min guard assist;sitting Pt Will Perform Lower Body Bathing: with min guard assist;sit to/from stand Pt Will Perform Upper Body Dressing: with min guard assist;sitting Pt Will Perform Lower Body Dressing: with min guard assist;sit to/from stand Pt Will Transfer to Toilet: with min guard  assist;ambulating Pt Will Perform Toileting - Clothing Manipulation and hygiene: with min guard assist;sit to/from stand Pt/caregiver will Perform Home Exercise Program: Left upper extremity;With written HEP provided;Independently  Plan Discharge plan remains appropriate    Co-evaluation                 End of Session Equipment Utilized During Treatment: Other (comment) (sling)  OT Visit Diagnosis: Pain;Unsteadiness on feet (R26.81) Pain - Right/Left: Left Pain - part of body: Shoulder   Activity Tolerance Patient tolerated treatment well;Patient limited by pain   Patient Left in chair;with call bell/phone within reach   Nurse Communication          Time: 9163-8466 OT Time Calculation (min): 25 min  Charges: OT General Charges $OT Visit: 1 Procedure OT Treatments $Self Care/Home Management : 8-22 mins $Therapeutic Exercise: 8-22 mins     Hortencia Pilar 02/08/2017, 1:20 PM

## 2017-02-08 NOTE — Discharge Summary (Signed)
Physician Discharge Summary  Patient ID: [name] MRN: [id number] DOB/AGE: 71-May-1947 71 y.o.  Admit date: 02/06/2017 Discharge date: 02/09/2017  Admission Diagnoses:  Left shoulder artritis  Discharge Diagnoses:  Active Problems:   S/P shoulder replacement, left   Past Medical History:  Diagnosis Date  . Anxiety   . Arthritis   . Depression   . GERD (gastroesophageal reflux disease)   . High cholesterol   . Hypertension   . Hypothyroid     Surgeries: Procedure(s): TOTAL SHOULDER ARTHROPLASTY on 02/06/2017   Consultants (if any):   Discharged Condition: Improved  Hospital Course: [name] is an 71 y.o. female who was admitted 02/06/2017 with a diagnosis of Left shoulder artritis and went to the operating room on 02/06/2017 and underwent the above named procedures.    She was given perioperative antibiotics:  Anti-infectives    Start     Dose/Rate Route Frequency Ordered Stop   02/06/17 1230  clindamycin (CLEOCIN) IVPB 600 mg     600 mg 100 mL/hr over 30 Minutes Intravenous Every 6 hours 02/06/17 1140 02/07/17 0247   02/06/17 0700  clindamycin (CLEOCIN) IVPB 900 mg     900 mg 100 mL/hr over 30 Minutes Intravenous To ShortStay Surgical 02/05/17 1151 02/06/17 0818    .  She was given sequential compression devices early ambulation for DVT prophylaxis.  She benefited maximally from the hospital stay and there were no complications.    Recent vital signs:  Vitals:   02/08/17 1900 02/09/17 0546  BP: (!) 123/50 124/60  Pulse: 88 77  Resp:  18  Temp: 99 F (37.2 C) 98.3 F (36.8 C)    Recent laboratory studies:  Lab Results  Component Value Date   HGB 11.3 (L) 02/07/2017   HGB 14.9 01/30/2017   HGB 15.3 (H) 04/16/2010   Lab Results  Component Value Date   WBC 9.1 01/30/2017   PLT 251 01/30/2017   No results found for: INR Lab Results  Component Value Date   NA 137 02/08/2017   K 3.7 02/08/2017   CL 102 02/08/2017   CO2 30 02/08/2017    BUN 11 02/08/2017   CREATININE 0.79 02/08/2017   GLUCOSE 102 (H) 02/08/2017    Discharge Medications:   Allergies as of 02/09/2017      Reactions   Latex Rash   Penicillins Rash   Has patient had a PCN reaction causing immediate rash, facial/tongue/throat swelling, SOB or lightheadedness with hypotension: #  #  #  YES  #  #  #  Has patient had a PCN reaction causing severe rash involving mucus membranes or skin necrosis: No Has patient had a PCN reaction that required hospitalization Yes Has patient had a PCN reaction occurring within the last 10 years: No If all of the above answers are "NO", then may proceed with Cephalosporin use.   Sulfa Antibiotics Rash      Medication List    TAKE these medications   acetaminophen 500 MG tablet Commonly known as:  TYLENOL Take 1,000 mg by mouth every 6 (six) hours as needed for mild pain or moderate pain.   ALPRAZolam 0.25 MG tablet Commonly known as:  XANAX Take 0.25 mg by mouth daily as needed for anxiety.   calcium carbonate 1500 (600 Ca) MG Tabs tablet Commonly known as:  OSCAL Take 1,500 mg by mouth daily with breakfast.   Vitamin D 2000 units Caps Take 2,000 Units by mouth daily.   cholecalciferol 1000 units  tablet Commonly known as:  VITAMIN D Take 2,000 Units by mouth daily.   citalopram 20 MG tablet Commonly known as:  CELEXA Take 20 mg by mouth every evening.   doxazosin 2 MG tablet Commonly known as:  CARDURA Take 2 mg by mouth at bedtime.   HYDROcodone-acetaminophen 5-325 MG tablet Commonly known as:  NORCO Take 0.5-1 tablets by mouth every 6 (six) hours as needed for moderate pain.   levothyroxine 75 MCG tablet Commonly known as:  SYNTHROID, LEVOTHROID Take 75 mcg by mouth daily.   loratadine 10 MG tablet Commonly known as:  CLARITIN Take 10 mg by mouth daily.   MAGNESIUM PO Take 266 mg by mouth daily. 133 mg each tablet   methocarbamol 500 MG tablet Commonly known as:  ROBAXIN Take 1 tablet (500  mg total) by mouth 3 (three) times daily as needed.   REFRESH PLUS OP Apply 1 drop to eye 4 (four) times daily.   simvastatin 20 MG tablet Commonly known as:  ZOCOR Take 20 mg by mouth every evening.   valsartan-hydrochlorothiazide 320-12.5 MG tablet Commonly known as:  DIOVAN-HCT Take 1 tablet by mouth daily.       Diagnostic Studies: Dg Shoulder Left Port  Result Date: 02/06/2017 CLINICAL DATA:  Shoulder replaced EXAM: LEFT SHOULDER - 1 VIEW COMPARISON:  09/21/2016 FINDINGS: Left shoulder total arthroplasty has been placed anatomic alignment. No breakage or loosening of the hardware. IMPRESSION: Left shoulder total hip arthroplasty placed anatomically aligned. Electronically Signed   By: Marybelle Killings M.D.   On: 02/06/2017 10:34    Disposition: 01-Home or Self Care  Discharge Instructions    Call MD / Call 911    Complete by:  As directed    If you experience chest pain or shortness of breath, CALL 911 and be transported to the hospital emergency room.  If you develope a fever above 101 F, pus (white drainage) or increased drainage or redness at the wound, or calf pain, call your surgeon's office.   Call MD / Call 911    Complete by:  As directed    If you experience chest pain or shortness of breath, CALL 911 and be transported to the hospital emergency room.  If you develope a fever above 101 F, pus (white drainage) or increased drainage or redness at the wound, or calf pain, call your surgeon's office.   Constipation Prevention    Complete by:  As directed    Drink plenty of fluids.  Prune juice may be helpful.  You may use a stool softener, such as Colace (over the counter) 100 mg twice a day.  Use MiraLax (over the counter) for constipation as needed.   Constipation Prevention    Complete by:  As directed    Drink plenty of fluids.  Prune juice may be helpful.  You may use a stool softener, such as Colace (over the counter) 100 mg twice a day.  Use MiraLax (over the counter)  for constipation as needed.   Diet - low sodium heart healthy    Complete by:  As directed    Diet - low sodium heart healthy    Complete by:  As directed    Discharge instructions    Complete by:  As directed    Please see Dr. Veverly Fells' discharge information sheet   Driving restrictions    Complete by:  As directed    No driving for 6 weeks   Increase activity slowly as tolerated  Complete by:  As directed    Increase activity slowly as tolerated    Complete by:  As directed    Lifting restrictions    Complete by:  As directed    No lifting for 6 weeks       Contact information for follow-up providers    NORRIS,STEVEN R, MD. Call in 2 week(s).   Specialty:  Orthopedic Surgery Why:  [phone] Contact information: [address] [place] [address] [phone]            Contact information for after-discharge care    [facility] .   Specialty:  Skilled Nursing Facility Contact information: [address] [place] [phone]                   Signed: Elie Goody 02/09/2017, 9:42 PM

## 2017-02-09 ENCOUNTER — Inpatient Hospital Stay
Admission: RE | Admit: 2017-02-09 | Discharge: 2017-02-28 | Disposition: A | Discharge status: Home / Self Care | Payer: Medicare Other | Source: Ambulatory Visit | Attending: Internal Medicine | Admitting: Internal Medicine

## 2017-02-09 ENCOUNTER — Encounter (HOSPITAL_COMMUNITY): Payer: Self-pay | Admitting: Orthopedic Surgery

## 2017-02-09 DIAGNOSIS — E039 Hypothyroidism, unspecified: Secondary | ICD-10-CM | POA: Diagnosis not present

## 2017-02-09 DIAGNOSIS — K219 Gastro-esophageal reflux disease without esophagitis: Secondary | ICD-10-CM | POA: Diagnosis not present

## 2017-02-09 DIAGNOSIS — E038 Other specified hypothyroidism: Secondary | ICD-10-CM | POA: Diagnosis not present

## 2017-02-09 DIAGNOSIS — E785 Hyperlipidemia, unspecified: Secondary | ICD-10-CM | POA: Diagnosis not present

## 2017-02-09 DIAGNOSIS — F329 Major depressive disorder, single episode, unspecified: Secondary | ICD-10-CM | POA: Diagnosis not present

## 2017-02-09 DIAGNOSIS — I10 Essential (primary) hypertension: Secondary | ICD-10-CM | POA: Diagnosis not present

## 2017-02-09 DIAGNOSIS — M1990 Unspecified osteoarthritis, unspecified site: Secondary | ICD-10-CM | POA: Diagnosis not present

## 2017-02-09 DIAGNOSIS — M6281 Muscle weakness (generalized): Secondary | ICD-10-CM | POA: Diagnosis not present

## 2017-02-09 DIAGNOSIS — Z96612 Presence of left artificial shoulder joint: Secondary | ICD-10-CM | POA: Diagnosis not present

## 2017-02-09 DIAGNOSIS — G8911 Acute pain due to trauma: Secondary | ICD-10-CM | POA: Diagnosis not present

## 2017-02-09 DIAGNOSIS — Z471 Aftercare following joint replacement surgery: Secondary | ICD-10-CM | POA: Diagnosis not present

## 2017-02-09 DIAGNOSIS — R262 Difficulty in walking, not elsewhere classified: Secondary | ICD-10-CM | POA: Diagnosis not present

## 2017-02-09 DIAGNOSIS — K5900 Constipation, unspecified: Secondary | ICD-10-CM | POA: Diagnosis not present

## 2017-02-09 DIAGNOSIS — S6980XA Other specified injuries of unspecified wrist, hand and finger(s), initial encounter: Secondary | ICD-10-CM | POA: Diagnosis not present

## 2017-02-09 DIAGNOSIS — E7800 Pure hypercholesterolemia, unspecified: Secondary | ICD-10-CM | POA: Diagnosis not present

## 2017-02-09 NOTE — Progress Notes (Signed)
Physical Therapy Treatment Patient Details Name: [name] MRN: [id number] DOB: 27-Feb-1946 Today's Date: 02/09/2017    History of Present Illness Pt is a 71 y.o. female complaining of left shoulder pain for multiple years.   PMH: anxiety, HTN.  Admit for left total shoulder arthroplasty.    PT Comments    Patient is making progress toward mobility goals. Worked on gait with use of SPC this session. Continue to progress as tolerated with anticipated d/c to SNF for further skilled PT services.    Follow Up Recommendations  SNF;Supervision/Assistance - 24 hour     Equipment Recommendations  Other (comment) (TBA)    Recommendations for Other Services       Precautions / Restrictions Precautions Precautions: Shoulder Type of Shoulder Precautions: conservative Shoulder Interventions: Shoulder sling/immobilizer;At all times Precaution Booklet Issued: Yes (comment) Precaution Comments: AROM elbow, wrist and hand to tolerance Required Braces or Orthoses: Sling Restrictions Weight Bearing Restrictions: Yes LUE Weight Bearing: Non weight bearing    Mobility  Bed Mobility               General bed mobility comments: pt OOB in chair upon arrival  Transfers Overall transfer level: Needs assistance Equipment used:  (gait belt) Transfers: Sit to/from Stand Sit to Stand: Min assist         General transfer comment: assist to steady upon standing  Ambulation/Gait Ambulation/Gait assistance: Min guard;Min assist Ambulation Distance (Feet):  (116ft without AD and 50 ft with AD) Assistive device: 1 person hand held assist;Straight cane Gait Pattern/deviations: Step-through pattern;Decreased stride length;Trunk flexed;Wide base of support;Drifts right/left Gait velocity: decreased   General Gait Details: pt demo'd improved balance from previous session however does continue to require assist to steady and pt is unable to tolerated challenges to balance without UE  support; short bilat step lengths; worked on sequencing of gait with use of SPC; cues for sequencing, forward gaze, and safety   Stairs            Wheelchair Mobility    Modified Rankin (Stroke Patients Only)       Balance Overall balance assessment: Needs assistance;History of Falls Sitting-balance support: No upper extremity supported;Feet supported Sitting balance-Leahy Scale: Fair       Standing balance-Leahy Scale: Fair Standing balance comment: able to static stand without UE support                    Cognition Arousal/Alertness: Awake/alert Behavior During Therapy: WFL for tasks assessed/performed Overall Cognitive Status: Within Functional Limits for tasks assessed                      Exercises      General Comments        Pertinent Vitals/Pain Pain Assessment: Faces Faces Pain Scale: Hurts little more Pain Location: shoulder Pain Descriptors / Indicators: Aching;Guarding Pain Intervention(s): Limited activity within patient's tolerance;Monitored during session;Premedicated before session;Repositioned    Home Living                      Prior Function            PT Goals (current goals can now be found in the care plan section) Acute Rehab PT Goals Patient Stated Goal: to go home after SNF Progress towards PT goals: Progressing toward goals    Frequency    Min 5X/week      PT Plan Current plan remains appropriate    Co-evaluation  End of Session Equipment Utilized During Treatment: Gait belt Activity Tolerance: Patient tolerated treatment well Patient left: in chair;with call bell/phone within reach Nurse Communication: Mobility status PT Visit Diagnosis: Muscle weakness (generalized) (M62.81);Unsteadiness on feet (R26.81)     Time: 5041-3643 PT Time Calculation (min) (ACUTE ONLY): 32 min  Charges:  $Gait Training: 23-37 mins                    G Codes:       Salina April, PTA Pager: [phone]   02/09/2017, 11:06 AM

## 2017-02-09 NOTE — Clinical Social Work Note (Addendum)
Clinical Social Worker facilitated patient discharge including contacting patient family and facility to confirm patient discharge plans.  Clinical information faxed to facility and family agreeable with plan. Patient is requesting that patient's daughter transport patient to facility. CSW reassured patient that if she wanted to schedule transport it would not be a problem, to let her RN know. RN to call [phone] for report prior to discharge.  Clinical Social Worker will sign off for now as social work intervention is no longer needed. Please consult [place] again if new need arises.  [address] [place]

## 2017-02-09 NOTE — Progress Notes (Addendum)
Pt D/C , pt's daughter will pick pt from [facility] to facility. RN called facility, report given to [facility]. D/C instruction done with teach back. Pt has no new concern   12.04  Pt's daughter arrived unit and did not feel comfortable transporting pt. Nurse notified CM, Transportation called for pt's transfer to [facility]illed Nursing facility

## 2017-02-09 NOTE — Progress Notes (Signed)
   Subjective: 3 Days Post-Op Procedure(s) (LRB): TOTAL SHOULDER ARTHROPLASTY (Left)  Recheck left shoulder s/p reverse Arm feels very heavy otherwise doing fair Plan to go to rehab today Denies any new symptoms or issues No numbness or tingling distally Patient reports pain as mild.  Objective:   VITALS:   Vitals:   02/08/17 1900 02/09/17 0546  BP: (!) 123/50 124/60  Pulse: 88 77  Resp:  18  Temp: 99 F (37.2 C) 98.3 F (36.8 C)    Left shoulder incision healing well nv intact distally Limited active rom with left upper extremity No rashes or edema  LABS  Recent Labs  02/07/17 0340  HGB 11.3*  HCT 34.7*     Recent Labs  02/07/17 0340 02/08/17 0554  NA 138 137  K 3.2* 3.7  BUN 11 11  CREATININE 0.85 0.79  GLUCOSE 101* 102*     Assessment/Plan: 3 Days Post-Op Procedure(s) (LRB): TOTAL SHOULDER ARTHROPLASTY (Left) D/c to rehab today f/u in 2 weeks Pt and daughter in agreement Pain management as needed   Merla Riches, MPAS, PA-C  02/09/2017, 12:09 PM

## 2017-02-09 NOTE — Discharge Summary (Signed)
Physician Discharge Summary   Patient ID: [name] MRN: [id number] DOB/AGE: [birth year] 71 y.o.  Admit date: 02/06/2017 Discharge date: 02/09/2017  Admission Diagnoses:  Active Problems:   S/P shoulder replacement, left   Discharge Diagnoses:  Same   Surgeries: Procedure(s): TOTAL SHOULDER ARTHROPLASTY on 02/06/2017   Consultants: PT/OT  Discharged Condition: Stable  Hospital Course: [name] is an 71 y.o. female who was admitted 02/06/2017 with a chief complaint of left shoulder pain, and found to have a diagnosis of left shoulder cuff arthropathy.  They were brought to the operating room on 02/06/2017 and underwent the above named procedures.    The patient had an uncomplicated hospital course and was stable for discharge.  Recent vital signs:  Vitals:   02/08/17 1900 02/09/17 0546  BP: (!) 123/50 124/60  Pulse: 88 77  Resp:  18  Temp: 99 F (37.2 C) 98.3 F (36.8 C)    Recent laboratory studies:  Results for orders placed or performed during the hospital encounter of 02/06/17  Hemoglobin and hematocrit, blood  Result Value Ref Range   Hemoglobin 11.3 (L) 12.0 - 15.0 g/dL   HCT 34.7 (L) 36.0 - 38.1 %  Basic metabolic panel  Result Value Ref Range   Sodium 138 135 - 145 mmol/L   Potassium 3.2 (L) 3.5 - 5.1 mmol/L   Chloride 104 101 - 111 mmol/L   CO2 28 22 - 32 mmol/L   Glucose, Bld 101 (H) 65 - 99 mg/dL   BUN 11 6 - 20 mg/dL   Creatinine, Ser 0.85 0.44 - 1.00 mg/dL   Calcium 8.7 (L) 8.9 - 10.3 mg/dL   GFR calc non Af Amer >60 >60 mL/min   GFR calc Af Amer >60 >60 mL/min   Anion gap 6 5 - 15  Basic metabolic panel  Result Value Ref Range   Sodium 137 135 - 145 mmol/L   Potassium 3.7 3.5 - 5.1 mmol/L   Chloride 102 101 - 111 mmol/L   CO2 30 22 - 32 mmol/L   Glucose, Bld 102 (H) 65 - 99 mg/dL   BUN 11 6 - 20 mg/dL   Creatinine, Ser 0.79 0.44 - 1.00 mg/dL   Calcium 8.8 (L) 8.9 - 10.3 mg/dL   GFR calc non Af Amer >60 >60 mL/min   GFR calc Af  Amer >60 >60 mL/min   Anion gap 5 5 - 15    Discharge Medications:   Allergies as of 02/09/2017      Reactions   Latex Rash   Penicillins Rash   Has patient had a PCN reaction causing immediate rash, facial/tongue/throat swelling, SOB or lightheadedness with hypotension: #  #  #  YES  #  #  #  Has patient had a PCN reaction causing severe rash involving mucus membranes or skin necrosis: No Has patient had a PCN reaction that required hospitalization Yes Has patient had a PCN reaction occurring within the last 10 years: No If all of the above answers are "NO", then may proceed with Cephalosporin use.   Sulfa Antibiotics Rash      Medication List    TAKE these medications   acetaminophen 500 MG tablet Commonly known as:  TYLENOL Take 1,000 mg by mouth every 6 (six) hours as needed for mild pain or moderate pain.   ALPRAZolam 0.25 MG tablet Commonly known as:  XANAX Take 0.25 mg by mouth daily as needed for anxiety.   calcium carbonate 1500 (600 Ca) MG  Tabs tablet Commonly known as:  OSCAL Take 1,500 mg by mouth daily with breakfast.   Vitamin D 2000 units Caps Take 2,000 Units by mouth daily.   cholecalciferol 1000 units tablet Commonly known as:  VITAMIN D Take 2,000 Units by mouth daily.   citalopram 20 MG tablet Commonly known as:  CELEXA Take 20 mg by mouth every evening.   doxazosin 2 MG tablet Commonly known as:  CARDURA Take 2 mg by mouth at bedtime.   HYDROcodone-acetaminophen 5-325 MG tablet Commonly known as:  NORCO Take 0.5-1 tablets by mouth every 6 (six) hours as needed for moderate pain.   levothyroxine 75 MCG tablet Commonly known as:  SYNTHROID, LEVOTHROID Take 75 mcg by mouth daily.   loratadine 10 MG tablet Commonly known as:  CLARITIN Take 10 mg by mouth daily.   MAGNESIUM PO Take 266 mg by mouth daily. 133 mg each tablet   methocarbamol 500 MG tablet Commonly known as:  ROBAXIN Take 1 tablet (500 mg total) by mouth 3 (three) times daily  as needed.   REFRESH PLUS OP Apply 1 drop to eye 4 (four) times daily.   simvastatin 20 MG tablet Commonly known as:  ZOCOR Take 20 mg by mouth every evening.   valsartan-hydrochlorothiazide 320-12.5 MG tablet Commonly known as:  DIOVAN-HCT Take 1 tablet by mouth daily.       Diagnostic Studies: Dg Shoulder Left Port  Result Date: 02/06/2017 CLINICAL DATA:  Shoulder replaced EXAM: LEFT SHOULDER - 1 VIEW COMPARISON:  09/21/2016 FINDINGS: Left shoulder total arthroplasty has been placed anatomic alignment. No breakage or loosening of the hardware. IMPRESSION: Left shoulder total hip arthroplasty placed anatomically aligned. Electronically Signed   By: Marybelle Killings M.D.   On: 02/06/2017 10:34    Disposition: 06-Home-Health Care Svc  Discharge Instructions    Call MD / Call 911    Complete by:  As directed    If you experience chest pain or shortness of breath, CALL 911 and be transported to the hospital emergency room.  If you develope a fever above 101 F, pus (white drainage) or increased drainage or redness at the wound, or calf pain, call your surgeon's office.   Call MD / Call 911    Complete by:  As directed    If you experience chest pain or shortness of breath, CALL 911 and be transported to the hospital emergency room.  If you develope a fever above 101 F, pus (white drainage) or increased drainage or redness at the wound, or calf pain, call your surgeon's office.   Constipation Prevention    Complete by:  As directed    Drink plenty of fluids.  Prune juice may be helpful.  You may use a stool softener, such as Colace (over the counter) 100 mg twice a day.  Use MiraLax (over the counter) for constipation as needed.   Constipation Prevention    Complete by:  As directed    Drink plenty of fluids.  Prune juice may be helpful.  You may use a stool softener, such as Colace (over the counter) 100 mg twice a day.  Use MiraLax (over the counter) for constipation as needed.   Diet -  low sodium heart healthy    Complete by:  As directed    Diet - low sodium heart healthy    Complete by:  As directed    Discharge instructions    Complete by:  As directed    Please see Dr. Veverly Fells'  discharge information sheet   Driving restrictions    Complete by:  As directed    No driving for 6 weeks   Increase activity slowly as tolerated    Complete by:  As directed    Increase activity slowly as tolerated    Complete by:  As directed    Lifting restrictions    Complete by:  As directed    No lifting for 6 weeks      Follow-up Information    NORRIS,STEVEN R, MD. Call in 2 week(s).   Specialty:  Orthopedic Surgery Why:  [phone] Contact information: [address] [place] [address] [phone]            Signed: Ventura Bruns 02/09/2017, 8:18 AM

## 2017-02-10 ENCOUNTER — Encounter: Payer: Self-pay | Admitting: Internal Medicine

## 2017-02-10 ENCOUNTER — Non-Acute Institutional Stay (SKILLED_NURSING_FACILITY): Payer: Medicare Other | Admitting: Internal Medicine

## 2017-02-10 ENCOUNTER — Other Ambulatory Visit: Payer: Self-pay | Admitting: *Deleted

## 2017-02-10 DIAGNOSIS — Z96612 Presence of left artificial shoulder joint: Secondary | ICD-10-CM

## 2017-02-10 DIAGNOSIS — E7800 Pure hypercholesterolemia, unspecified: Secondary | ICD-10-CM | POA: Diagnosis not present

## 2017-02-10 DIAGNOSIS — I10 Essential (primary) hypertension: Secondary | ICD-10-CM | POA: Diagnosis not present

## 2017-02-10 DIAGNOSIS — K5900 Constipation, unspecified: Secondary | ICD-10-CM | POA: Diagnosis not present

## 2017-02-10 MED ORDER — HYDROCODONE-ACETAMINOPHEN 5-325 MG PO TABS: ORAL_TABLET | ORAL | 0 refills | Status: DC

## 2017-02-10 NOTE — Progress Notes (Addendum)
Provider:  Meredith Staggers.Lyndel Safe Location:   [facility] Room Number: 131/P Place of Service:  SNF (31)  PCP: [name], MD Patient Care Team: [name], MD as PCP - General (Internal Medicine)  Extended Emergency Contact Information Primary Emergency Contact: Corum,Lisa  [place] Phone: [phone] Relation: Niece Secondary Emergency Contact: Nyra Capes [place] Phone: [phone] Relation: Daughter  Code Status: Full Code Goals of Care: Advanced Directive information Advanced Directives 02/10/2017  Does Patient Have a Medical Advance Directive? Yes  Type of Advance Directive (No Data)  Does patient want to make changes to medical advance directive? No - Patient declined  Copy of [place] in Chart? -  Pre-existing out of facility DNR order (yellow form or pink MOST form) -      Chief Complaint  Patient presents with  . New Admit To SNF    HPI: Patient is a 71 y.o. female seen today for admission to to SNF for therapy after elective Left Total Shoulder Arthroplasty performed on 03/16 . She had failed Conservative therapy as outpatient for Shoulder arthritis. Patient also has h/o Hypertension, Hypothyroid, Hyperlipidemia and Anxiety. Patient is doing well in the facility. D/W therapist and they said she was able to walk with the cane today. Her pain is controlled with Muscle relaxant and Norco. Patient does have some c/o Constipation. But no abdominal pain, Nausea or vomiting. She lives by herself. Does not have much help. Was very active before surgery.   Past Medical History:  Diagnosis Date  . Anxiety   . Arthritis   . Depression   . GERD (gastroesophageal reflux disease)   . High cholesterol   . Hypertension   . Hypothyroid    Past Surgical History:  Procedure Laterality Date  . APPENDECTOMY    . BUNIONECTOMY    . COLONOSCOPY  06/18/2012   Procedure: COLONOSCOPY;   Surgeon: [name], MD;  Location: [facility];  Service: Endoscopy;  Laterality: N/A;  730  . ESOPHAGOGASTRODUODENOSCOPY    . hammer toe repair    . HEMORRHOID SURGERY    . THYROIDECTOMY    . TONSILLECTOMY    . TOTAL ABDOMINAL HYSTERECTOMY    . TOTAL SHOULDER ARTHROPLASTY Left 02/06/2017  . TOTAL SHOULDER ARTHROPLASTY Left 02/06/2017   Procedure: TOTAL SHOULDER ARTHROPLASTY;  Surgeon: [name], MD;  Location: [facility];  Service: Orthopedics;  Laterality: Left;    reports that she has never smoked. She has never used smokeless tobacco. She reports that she does not drink alcohol or use drugs. Social History   Social History  . Marital status: Married    Spouse name: N/A  . Number of children: N/A  . Years of education: N/A   Occupational History  . Not on file.   Social History Main Topics  . Smoking status: Never Smoker  . Smokeless tobacco: Never Used  . Alcohol use No  . Drug use: No  . Sexual activity: Not on file   Other Topics Concern  . Not on file   Social History Narrative  . No narrative on file    Functional Status Survey:    Family History  Problem Relation Age of Onset  . Colon cancer Neg Hx     Health Maintenance  Topic Date Due  . TETANUS/TDAP  04/19/1965  . MAMMOGRAM  04/19/1996  . DEXA SCAN  04/20/2011  . INFLUENZA VACCINE  10/24/2017 (Originally 06/24/2016)  .  PNA vac Low Risk Adult (1 of 2 - PCV13) 10/24/2017 (Originally 04/20/2011)  . COLONOSCOPY  06/18/2022  . Hepatitis C Screening  Completed    Allergies  Allergen Reactions  . Latex Rash  . Penicillins Rash    Has patient had a PCN reaction causing immediate rash, facial/tongue/throat swelling, SOB or lightheadedness with hypotension: #  #  #  YES  #  #  #  Has patient had a PCN reaction causing severe rash involving mucus membranes or skin necrosis: No Has patient had a PCN reaction that required hospitalization Yes Has patient had a PCN reaction occurring within the last 10  years: No If all of the above answers are "NO", then may proceed with Cephalosporin use.   . Sulfa Antibiotics Rash    Allergies as of 02/10/2017      Reactions   Latex Rash   Penicillins Rash   Has patient had a PCN reaction causing immediate rash, facial/tongue/throat swelling, SOB or lightheadedness with hypotension: #  #  #  YES  #  #  #  Has patient had a PCN reaction causing severe rash involving mucus membranes or skin necrosis: No Has patient had a PCN reaction that required hospitalization Yes Has patient had a PCN reaction occurring within the last 10 years: No If all of the above answers are "NO", then may proceed with Cephalosporin use.   Sulfa Antibiotics Rash      Medication List       Accurate as of 02/10/17 10:04 AM. Always use your most recent med list.          acetaminophen 325 MG tablet Commonly known as:  TYLENOL Take 650 mg by mouth every 6 (six) hours as needed.   calcium carbonate 1500 (600 Ca) MG Tabs tablet Commonly known as:  OSCAL Take 1,500 mg by mouth daily with breakfast.   citalopram 20 MG tablet Commonly known as:  CELEXA Take 20 mg by mouth every evening.   doxazosin 2 MG tablet Commonly known as:  CARDURA Take 2 mg by mouth at bedtime.   HYDROcodone-acetaminophen 5-325 MG tablet Commonly known as:  NORCO Take one to two tablets by mouth every 6 hours as needed for moderate to severe pain. Max APAP 3gm/24 hours from all sources   levothyroxine 75 MCG tablet Commonly known as:  SYNTHROID, LEVOTHROID Take 75 mcg by mouth daily.   loratadine 10 MG tablet Commonly known as:  CLARITIN Take 10 mg by mouth daily.   MAGNESIUM PO Take 266 mg by mouth daily. 133 mg each tablet   methocarbamol 500 MG tablet Commonly known as:  ROBAXIN Take 1 tablet (500 mg total) by mouth 3 (three) times daily as needed.   ondansetron 4 MG tablet Commonly known as:  ZOFRAN Take 4 mg by mouth every 8 (eight) hours as needed for nausea or vomiting.     REFRESH PLUS OP Apply 1 drop to eye 4 (four) times daily.   simvastatin 20 MG tablet Commonly known as:  ZOCOR Take 20 mg by mouth every evening.   valsartan-hydrochlorothiazide 320-12.5 MG tablet Commonly known as:  DIOVAN-HCT Take 1 tablet by mouth daily.   Vitamin D 2000 units Caps Take 2,000 Units by mouth daily.       Review of Systems  Review of Systems  Constitutional: Negative for activity change, appetite change, chills, diaphoresis, fatigue and fever.  HENT: Negative for mouth sores, postnasal drip, rhinorrhea, sinus pain and sore throat.   Respiratory:  Negative for apnea,  chest tightness, shortness of breath and wheezing.  Has some dry Cough Cardiovascular: Negative for chest pain, palpitations and leg swelling.  Gastrointestinal: Negative for abdominal distention, abdominal pain, constipation, diarrhea, nausea and vomiting.  Genitourinary: Negative for dysuria and frequency.  Musculoskeletal: Negative for arthralgias, joint swelling and myalgias. Does have some Pain and stiffness in Left knee Skin: Negative for rash.  Neurological: Negative for dizziness, syncope, weakness, light-headedness and numbness.  Psychiatric/Behavioral: Negative for behavioral problems, confusion and sleep disturbance.    Vitals:   02/10/17 0945  BP: 135/78  Pulse: 93  Resp: 20  Temp: 98.7 F (37.1 C)  TempSrc: Oral   There is no height or weight on file to calculate BMI. Physical Exam  Constitutional: She is oriented to person, place, and time. She appears well-developed and well-nourished.  HENT:  Head: Normocephalic.  Mouth/Throat: Oropharynx is clear and moist.  Eyes: Pupils are equal, round, and reactive to light.  Neck: Neck supple.  Cardiovascular: Normal rate, regular rhythm and normal heart sounds.   No murmur heard. Pulmonary/Chest: Effort normal and breath sounds normal. No respiratory distress. She has no wheezes. She has no rales.  Abdominal: Soft. Bowel sounds  are normal. She exhibits no distension. There is no tenderness. There is no rebound.  Musculoskeletal: She exhibits no edema.  Neurological: She is alert and oriented to person, place, and time.  Patient had limited movement of Left side due to Arthritis.  Skin: Skin is warm and dry. No rash noted. No erythema. No pallor.  Psychiatric: She has a normal mood and affect. Her behavior is normal. Judgment and thought content normal.    Labs reviewed: Basic Metabolic Panel:  Recent Labs  01/30/17 1018 02/07/17 0340 02/08/17 0554  NA 139 138 137  K 3.6 3.2* 3.7  CL 107 104 102  CO2 26 28 30   GLUCOSE 117* 101* 102*  BUN 10 11 11   CREATININE 0.79 0.85 0.79  CALCIUM 10.0 8.7* 8.8*   Liver Function Tests: No results for input(s): AST, ALT, ALKPHOS, BILITOT, PROT, ALBUMIN in the last 8760 hours. No results for input(s): LIPASE, AMYLASE in the last 8760 hours. No results for input(s): AMMONIA in the last 8760 hours. CBC:  Recent Labs  01/30/17 1018 02/07/17 0340  WBC 9.1  --   HGB 14.9 11.3*  HCT 44.3 34.7*  MCV 92.9  --   PLT 251  --    Cardiac Enzymes: No results for input(s): CKTOTAL, CKMB, CKMBINDEX, TROPONINI in the last 8760 hours. BNP: Invalid input(s): POCBNP No results found for: HGBA1C No results found for: TSH No results found for: VITAMINB12 No results found for: FOLATE Lab Results  Component Value Date   FERRITIN 32 01/25/2013    Imaging and Procedures obtained prior to SNF admission: No results found.  Assessment/Plan  Status post left Total shoulder arthroplasty  Pain controlled. Continue Robaxin and Norco. Doing well with Therapy. Will repeat CBC to follow Hgb. Follow up with Ortho.  Essential hypertension BP Controlled on Cardura and Diovan Constipation Start on Miralax  Hyperlipidemia Continue Simvastatin  Hypothyroid Continue Synthroid. Will repeat TSH.    Family/ staff Communication:   Labs/tests ordered: CBC, CMP, TSH

## 2017-02-10 NOTE — Telephone Encounter (Signed)
[facility] #[phone] Fax: [phone]   

## 2017-02-16 ENCOUNTER — Encounter (HOSPITAL_COMMUNITY)
Admission: RE | Admit: 2017-02-16 | Discharge: 2017-02-16 | Disposition: A | Discharge status: Home / Self Care | Payer: Medicare Other | Source: Skilled Nursing Facility | Attending: Internal Medicine | Admitting: Internal Medicine

## 2017-02-16 DIAGNOSIS — F329 Major depressive disorder, single episode, unspecified: Secondary | ICD-10-CM | POA: Insufficient documentation

## 2017-02-16 DIAGNOSIS — I10 Essential (primary) hypertension: Secondary | ICD-10-CM | POA: Insufficient documentation

## 2017-02-16 DIAGNOSIS — E038 Other specified hypothyroidism: Secondary | ICD-10-CM | POA: Insufficient documentation

## 2017-02-16 DIAGNOSIS — Z471 Aftercare following joint replacement surgery: Secondary | ICD-10-CM | POA: Insufficient documentation

## 2017-02-16 DIAGNOSIS — E785 Hyperlipidemia, unspecified: Secondary | ICD-10-CM | POA: Insufficient documentation

## 2017-02-16 LAB — COMPREHENSIVE METABOLIC PANEL
ALBUMIN: 3 g/dL — AB (ref 3.5–5.0)
ALT: 23 U/L (ref 14–54)
AST: 26 U/L (ref 15–41)
Alkaline Phosphatase: 126 U/L (ref 38–126)
Anion gap: 6 (ref 5–15)
BUN: 9 mg/dL (ref 6–20)
CALCIUM: 9.2 mg/dL (ref 8.9–10.3)
CO2: 27 mmol/L (ref 22–32)
CREATININE: 0.71 mg/dL (ref 0.44–1.00)
Chloride: 104 mmol/L (ref 101–111)
Glucose, Bld: 94 mg/dL (ref 65–99)
Potassium: 3.7 mmol/L (ref 3.5–5.1)
SODIUM: 137 mmol/L (ref 135–145)
Total Bilirubin: 0.7 mg/dL (ref 0.3–1.2)
Total Protein: 6 g/dL — ABNORMAL LOW (ref 6.5–8.1)

## 2017-02-16 LAB — CBC WITH DIFFERENTIAL/PLATELET
BASOS ABS: 0.1 10*3/uL (ref 0.0–0.1)
BASOS PCT: 1 %
EOS ABS: 0.4 10*3/uL (ref 0.0–0.7)
EOS PCT: 5 %
HCT: 37.5 % (ref 36.0–46.0)
Hemoglobin: 12.4 g/dL (ref 12.0–15.0)
LYMPHS PCT: 27 %
Lymphs Abs: 2 10*3/uL (ref 0.7–4.0)
MCH: 30.8 pg (ref 26.0–34.0)
MCHC: 33.1 g/dL (ref 30.0–36.0)
MCV: 93.3 fL (ref 78.0–100.0)
MONO ABS: 0.9 10*3/uL (ref 0.1–1.0)
Monocytes Relative: 12 %
Neutro Abs: 3.9 10*3/uL (ref 1.7–7.7)
Neutrophils Relative %: 55 %
PLATELETS: 412 10*3/uL — AB (ref 150–400)
RBC: 4.02 MIL/uL (ref 3.87–5.11)
RDW: 13.1 % (ref 11.5–15.5)
WBC: 7.3 10*3/uL (ref 4.0–10.5)

## 2017-02-16 LAB — MAGNESIUM: MAGNESIUM: 2 mg/dL (ref 1.7–2.4)

## 2017-02-16 LAB — TSH: TSH: 0.407 u[IU]/mL (ref 0.350–4.500)

## 2017-02-19 DIAGNOSIS — Z96612 Presence of left artificial shoulder joint: Secondary | ICD-10-CM | POA: Diagnosis not present

## 2017-02-19 DIAGNOSIS — Z471 Aftercare following joint replacement surgery: Secondary | ICD-10-CM | POA: Diagnosis not present

## 2017-02-25 ENCOUNTER — Non-Acute Institutional Stay (SKILLED_NURSING_FACILITY): Payer: Medicare Other | Admitting: Internal Medicine

## 2017-02-25 DIAGNOSIS — E7800 Pure hypercholesterolemia, unspecified: Secondary | ICD-10-CM | POA: Diagnosis not present

## 2017-02-25 DIAGNOSIS — Z96612 Presence of left artificial shoulder joint: Secondary | ICD-10-CM | POA: Diagnosis not present

## 2017-02-25 DIAGNOSIS — I10 Essential (primary) hypertension: Secondary | ICD-10-CM | POA: Diagnosis not present

## 2017-02-25 DIAGNOSIS — E039 Hypothyroidism, unspecified: Secondary | ICD-10-CM

## 2017-02-25 NOTE — Progress Notes (Signed)
This is a discharge note.  Level care skilled.  Facility is [facility].  Chief complaint discharge note.  History of present illness.  Patient is a pleasant 71 year old female seen for discharge from facility later this week.  She has been in skilled nursing for therapy after an elective left total shoulder arthroplasty performed on March 16.  She had failed conservative therapy as an outpatient for arthritis of her shoulder.  She also has a history of hypertension has hypothyroidism hyperlipidemia and anxiety all these have been fairly stable during her stay here.  She receives Norco for pain as well as a muscle relaxer which appears to be effective  She does live by herself-she will need PT and OT for strengthening.  She is ambulating greater than 100 feet with no assistance device continues with a sling to her left upper arm.  She has no complaints this evening vital signs appear to be stable  Past Medical History:  Diagnosis Date  . Anxiety   . Arthritis   . Depression   . GERD (gastroesophageal reflux disease)   . High cholesterol   . Hypertension   . Hypothyroid         Past Surgical History:  Procedure Laterality Date  . APPENDECTOMY    . BUNIONECTOMY    . COLONOSCOPY  06/18/2012   Procedure: COLONOSCOPY;  Surgeon: [name], MD;  [facility];  Service: Endoscopy;  Laterality: N/A;  730  . ESOPHAGOGASTRODUODENOSCOPY    . hammer toe repair    . HEMORRHOID SURGERY    . THYROIDECTOMY    . TONSILLECTOMY    . TOTAL ABDOMINAL HYSTERECTOMY    . TOTAL SHOULDER ARTHROPLASTY Left 02/06/2017  . TOTAL SHOULDER ARTHROPLASTY Left 02/06/2017   Procedure: TOTAL SHOULDER ARTHROPLASTY;  Surgeon: [name], MD;  Location: [facility];  Service: Orthopedics;  Laterality: Left;    reports that she has never smoked. She has never used smokeless tobacco. She reports that she does not drink alcohol or use drugs. Social History          Social History  . Marital status: Married    Spouse name: N/A  . Number of children: N/A  . Years of education: N/A      Occupational History  . Not on file.       Social History Main Topics  . Smoking status: Never Smoker  . Smokeless tobacco: Never Used  . Alcohol use No  . Drug use: No  . Sexual activity: Not on file       Other Topics Concern  . Not on file      Social History Narrative  . No narrative on file    Functional Status Survey:       Family History  Problem Relation Age of Onset  . Colon cancer Neg Hx         Health Maintenance  Topic Date Due  . TETANUS/TDAP  04/19/1965  . MAMMOGRAM  04/19/1996  . DEXA SCAN  04/20/2011  . INFLUENZA VACCINE  10/24/2017 (Originally 06/24/2016)  . PNA vac Low Risk Adult (1 of 2 - PCV13) 10/24/2017 (Originally 04/20/2011)  . COLONOSCOPY  06/18/2022  . Hepatitis C Screening  Completed         Allergies  Allergen Reactions  . Latex Rash  . Penicillins Rash    Has patient had a PCN reaction causing immediate rash, facial/tongue/throat swelling, SOB or lightheadedness with hypotension: #  #  #  YES  #  #  #  Has patient had a PCN reaction causing severe rash involving mucus membranes or skin necrosis: No Has patient had a PCN reaction that required hospitalization Yes Has patient had a PCN reaction occurring within the last 10 years: No If all of the above answers are "NO", then may proceed with Cephalosporin use.   . Sulfa Antibiotics Rash    Allergies as of 02/10/2017      Reactions   Latex Rash   Penicillins Rash   Has patient had a PCN reaction causing immediate rash, facial/tongue/throat swelling, SOB or lightheadedness with hypotension: #  #  #  YES  #  #  #  Has patient had a PCN reaction causing severe rash involving mucus membranes or skin necrosis: No Has patient had a PCN reaction that required hospitalization Yes Has patient had a PCN reaction occurring within the  last 10 years: No If all of the above answers are "NO", then may proceed with Cephalosporin use.   Sulfa Antibiotics Rash               Medication List           Accurate as of 02/10/17 10:04 AM. Always use your most recent med list.           acetaminophen 325 MG tablet Commonly known as:  TYLENOL Take 650 mg by mouth every 6 (six) hours as needed.   calcium carbonate 1500 (600 Ca) MG Tabs tablet Commonly known as:  OSCAL Take 1,500 mg by mouth daily with breakfast.   citalopram 20 MG tablet Commonly known as:  CELEXA Take 20 mg by mouth every evening.   doxazosin 2 MG tablet Commonly known as:  CARDURA Take 2 mg by mouth at bedtime.   HYDROcodone-acetaminophen 5-325 MG tablet Commonly known as:  NORCO Take one to two tablets by mouth every 6 hours as needed for moderate to severe pain. Max APAP 3gm/24 hours from all sources   levothyroxine 75 MCG tablet Commonly known as:  SYNTHROID, LEVOTHROID Take 75 mcg by mouth daily.   loratadine 10 MG tablet Commonly known as:  CLARITIN Take 10 mg by mouth daily.   MAGNESIUM PO Take 266 mg by mouth daily. 133 mg each tablet   methocarbamol 500 MG tablet Commonly known as:  ROBAXIN Take 1 tablet (500 mg total) by mouth 3 (three) times daily as needed.   ondansetron 4 MG tablet Commonly known as:  ZOFRAN Take 4 mg by mouth every 8 (eight) hours as needed for nausea or vomiting.   REFRESH PLUS OP Apply 1 drop to eye 4 (four) times daily.   simvastatin 20 MG tablet Commonly known as:  ZOCOR Take 20 mg by mouth every evening.   valsartan-hydrochlorothiazide 320-12.5 MG tablet Commonly known as:  DIOVAN-HCT Take 1 tablet by mouth daily.   Vitamin D 2000 units Caps Take 2,000 Units by mouth daily.        Review of Systems  Constitutional: Negative for activity change, appetite change, chills, diaphoresis, fatigue and fever.  HENT: Negative for mouth sores, postnasal drip, rhinorrhea,  sinus pain and sore throat.   Respiratory: Negative for apnea,  chest tightness, shortness of breath and wheezing.  Cardiovascular: Negative for chest pain, palpitations and leg swelling.  Gastrointestinal: Negative for abdominal distention, abdominal pain, constipation, diarrhea, nausea and vomiting. Says her appetite is not that great thinks probably because the food here is not like  Her  home cooking  Genitourinary: Negative for dysuria and frequency.  Musculoskeletal: Negative for arthralgias, joint swelling and myalgias. Does have some shoulder discomfort at times but tries to minimize her use of Norco Skin: Negative for rash.  Neurological: Negative for dizziness, syncope, weakness, light-headedness and numbness.  Psychiatric/Behavioral: Negative for behavioral problems, confusion and sleep disturbance.       Vitals:                      Temperature is 97.5 pulse 71 respirations 17 blood pressure 134/59 Physical Exam  Constitutional: She is oriented to person, place, and time. She appears well-developed and well-nourished.  HENT:  Head: Normocephalic.  Mouth/Throat: Oropharynx is clear and moist.  Eyes: Pupils are equal, round, and reactive to light.  Neck: Neck supple.  Cardiovascular: Normal rate, regular rhythm and normal heart sounds.   No murmur heard. Pulmonary/Chest: Effort normal and breath sounds normal. No respiratory distress. She has no wheezes. She has no rales.  Abdominal: Soft. Bowel sounds are normal. She exhibits no distension. There is no tenderness. There is no rebound.  Musculoskeletal: She exhibits no edema. Moves all extremities 4 is ambulating well without assistance.  Has a well-healed surgical scar left shoulder area --no  drainage bleeding or surrounding erythema suspicious for infection--limited range of motion of shoulder again this is per orthopedic recommendations for limited movement  Neurological: She is alert and oriented to person,  place, and time.  Skin: Skin is warm and dry. No rash noted. No erythema. No pallor left shoulder surgical scar is noted above  Psychiatric: She has a normal mood and affect. Her behavior is normal. Judgment and thought content normal.    Labs reviewed:  02/16/2017.  Magnesium 2.0.  Sodium 137 potassium 3.7 BUN 9 creatinine 0.71.  Albumin of 3 otherwise liver function tests within normal limits.  WBC 7.3 hemoglobin 12.4 platelets 185 Basic Metabolic Panel:  [id number])   Recent Labs  01/30/17 1018 02/07/17 0340 02/08/17 0554  NA 139 138 137  K 3.6 3.2* 3.7  CL 107 104 102  CO2 26 28 30   GLUCOSE 117* 101* 102*  BUN 10 11 11   CREATININE 0.79 0.85 0.79  CALCIUM 10.0 8.7* 8.8*     Liver Function Tests: RecentLabs(withinlast365days)  No results for input(s): AST, ALT, ALKPHOS, BILITOT, PROT, ALBUMIN in the last 8760 hours.   RecentLabs(withinlast365days)  No results for input(s): LIPASE, AMYLASE in the last 8760 hours.   RecentLabs(withinlast365days)  No results for input(s): AMMONIA in the last 8760 hours.   CBC:  RecentLabs(withinlast365days)   Recent Labs  01/30/17 1018 02/07/17 0340  WBC 9.1  --   HGB 14.9 11.3*  HCT 44.3 34.7*  MCV 92.9  --   PLT 251  --      Cardiac Enzymes: RecentLabs(withinlast365days)  No results for input(s): CKTOTAL, CKMB, CKMBINDEX, TROPONINI in the last 8760 hours.   BNP: RecentLabs(withinlast365days)  Invalid input(s): POCBNP   RecentLabs  No results found for: HGBA1C   RecentLabs  No results found for: TSH   RecentLabs  No results found for: VITAMINB12   RecentLabs  No results found for: FOLATE   RecentLabs       Lab Results  Component Value Date   FERRITIN 32 01/25/2013      Imaging and Procedures obtained prior to SNF admission: No results found.  Assessment/Plan  Status post left Total shoulder arthroplasty  Pain  controlled. Continue Robaxin and Norco. Doing well with Therapy. Will need PT and OT  at home for continued rehabilitation as well as Follow up with Ortho.  Essential hypertension BP Controlled on Cardura and Diovan do not see consistent elevations recent blood pressures 115/74-126/82-145/79  Constipation This appears stabilized on MiraLAX  Hyperlipidemia Continue Simvastatin since her stay here is been quite short will defer follow-up to primary care provider oformanagement of her r lipids  Hypothyroid Continue Synthroid. Level March 26 showed TSH within normal limits at 0.407 will need follow-up by primary care provider  Depression-this appears stable on Celexa.  History of mild hypokalemia in the past we will update a BMP before discharge also will update a CBC to monitor her hemoglobin which appears relatively stable at 12.4    Again she will be home by herself will get PT and OT she is ambulating well without an assistance device does have Norco as needed for pain.  [id number] note greater than 30 minutes spent on this discharge summary-greater than 50% of time spent coordinating plan of care for numerous diagnoses

## 2017-02-26 ENCOUNTER — Encounter (HOSPITAL_COMMUNITY)
Admission: RE | Admit: 2017-02-26 | Discharge: 2017-02-26 | Disposition: A | Discharge status: Home / Self Care | Payer: Medicare Other | Source: Skilled Nursing Facility | Attending: Internal Medicine | Admitting: Internal Medicine

## 2017-02-26 DIAGNOSIS — R262 Difficulty in walking, not elsewhere classified: Secondary | ICD-10-CM | POA: Insufficient documentation

## 2017-02-26 DIAGNOSIS — Z471 Aftercare following joint replacement surgery: Secondary | ICD-10-CM | POA: Insufficient documentation

## 2017-02-26 DIAGNOSIS — J302 Other seasonal allergic rhinitis: Secondary | ICD-10-CM | POA: Insufficient documentation

## 2017-02-26 DIAGNOSIS — K219 Gastro-esophageal reflux disease without esophagitis: Secondary | ICD-10-CM | POA: Insufficient documentation

## 2017-02-26 DIAGNOSIS — M6281 Muscle weakness (generalized): Secondary | ICD-10-CM | POA: Insufficient documentation

## 2017-02-26 LAB — CBC
HCT: 44.4 % (ref 36.0–46.0)
Hemoglobin: 14.5 g/dL (ref 12.0–15.0)
MCH: 30.2 pg (ref 26.0–34.0)
MCHC: 32.7 g/dL (ref 30.0–36.0)
MCV: 92.5 fL (ref 78.0–100.0)
Platelets: 357 10*3/uL (ref 150–400)
RBC: 4.8 MIL/uL (ref 3.87–5.11)
RDW: 13.2 % (ref 11.5–15.5)
WBC: 6.7 10*3/uL (ref 4.0–10.5)

## 2017-02-26 LAB — BASIC METABOLIC PANEL
Anion gap: 11 (ref 5–15)
BUN: 13 mg/dL (ref 6–20)
CO2: 26 mmol/L (ref 22–32)
CREATININE: 0.75 mg/dL (ref 0.44–1.00)
Calcium: 9.6 mg/dL (ref 8.9–10.3)
Chloride: 101 mmol/L (ref 101–111)
GFR calc Af Amer: >60 mL/min (ref >60)
GFR calc non Af Amer: >60 mL/min (ref >60)
GLUCOSE: 131 mg/dL — AB (ref 65–99)
POTASSIUM: 3.5 mmol/L (ref 3.5–5.1)
Sodium: 138 mmol/L (ref 135–145)

## 2017-03-03 DIAGNOSIS — Z79891 Long term (current) use of opiate analgesic: Secondary | ICD-10-CM | POA: Diagnosis not present

## 2017-03-03 DIAGNOSIS — F419 Anxiety disorder, unspecified: Secondary | ICD-10-CM | POA: Diagnosis not present

## 2017-03-03 DIAGNOSIS — F329 Major depressive disorder, single episode, unspecified: Secondary | ICD-10-CM | POA: Diagnosis not present

## 2017-03-03 DIAGNOSIS — Z471 Aftercare following joint replacement surgery: Secondary | ICD-10-CM | POA: Diagnosis not present

## 2017-03-03 DIAGNOSIS — E7800 Pure hypercholesterolemia, unspecified: Secondary | ICD-10-CM | POA: Diagnosis not present

## 2017-03-03 DIAGNOSIS — Z6835 Body mass index (BMI) 35.0-35.9, adult: Secondary | ICD-10-CM | POA: Diagnosis not present

## 2017-03-03 DIAGNOSIS — E039 Hypothyroidism, unspecified: Secondary | ICD-10-CM | POA: Diagnosis not present

## 2017-03-03 DIAGNOSIS — K219 Gastro-esophageal reflux disease without esophagitis: Secondary | ICD-10-CM | POA: Diagnosis not present

## 2017-03-03 DIAGNOSIS — Z96612 Presence of left artificial shoulder joint: Secondary | ICD-10-CM | POA: Diagnosis not present

## 2017-03-03 DIAGNOSIS — I10 Essential (primary) hypertension: Secondary | ICD-10-CM | POA: Diagnosis not present

## 2017-03-03 DIAGNOSIS — M170 Bilateral primary osteoarthritis of knee: Secondary | ICD-10-CM | POA: Diagnosis not present

## 2017-03-05 DIAGNOSIS — F329 Major depressive disorder, single episode, unspecified: Secondary | ICD-10-CM | POA: Diagnosis not present

## 2017-03-05 DIAGNOSIS — M170 Bilateral primary osteoarthritis of knee: Secondary | ICD-10-CM | POA: Diagnosis not present

## 2017-03-05 DIAGNOSIS — E7800 Pure hypercholesterolemia, unspecified: Secondary | ICD-10-CM | POA: Diagnosis not present

## 2017-03-05 DIAGNOSIS — I10 Essential (primary) hypertension: Secondary | ICD-10-CM | POA: Diagnosis not present

## 2017-03-05 DIAGNOSIS — F419 Anxiety disorder, unspecified: Secondary | ICD-10-CM | POA: Diagnosis not present

## 2017-03-05 DIAGNOSIS — Z471 Aftercare following joint replacement surgery: Secondary | ICD-10-CM | POA: Diagnosis not present

## 2017-03-06 DIAGNOSIS — E7800 Pure hypercholesterolemia, unspecified: Secondary | ICD-10-CM | POA: Diagnosis not present

## 2017-03-06 DIAGNOSIS — F419 Anxiety disorder, unspecified: Secondary | ICD-10-CM | POA: Diagnosis not present

## 2017-03-06 DIAGNOSIS — I10 Essential (primary) hypertension: Secondary | ICD-10-CM | POA: Diagnosis not present

## 2017-03-06 DIAGNOSIS — F329 Major depressive disorder, single episode, unspecified: Secondary | ICD-10-CM | POA: Diagnosis not present

## 2017-03-06 DIAGNOSIS — M170 Bilateral primary osteoarthritis of knee: Secondary | ICD-10-CM | POA: Diagnosis not present

## 2017-03-06 DIAGNOSIS — Z471 Aftercare following joint replacement surgery: Secondary | ICD-10-CM | POA: Diagnosis not present

## 2017-03-09 DIAGNOSIS — Z09 Encounter for follow-up examination after completed treatment for conditions other than malignant neoplasm: Secondary | ICD-10-CM | POA: Diagnosis not present

## 2017-03-09 DIAGNOSIS — Z299 Encounter for prophylactic measures, unspecified: Secondary | ICD-10-CM | POA: Diagnosis not present

## 2017-03-09 DIAGNOSIS — G4700 Insomnia, unspecified: Secondary | ICD-10-CM | POA: Diagnosis not present

## 2017-03-09 DIAGNOSIS — M170 Bilateral primary osteoarthritis of knee: Secondary | ICD-10-CM | POA: Diagnosis not present

## 2017-03-09 DIAGNOSIS — E7800 Pure hypercholesterolemia, unspecified: Secondary | ICD-10-CM | POA: Diagnosis not present

## 2017-03-09 DIAGNOSIS — F329 Major depressive disorder, single episode, unspecified: Secondary | ICD-10-CM | POA: Diagnosis not present

## 2017-03-09 DIAGNOSIS — M25512 Pain in left shoulder: Secondary | ICD-10-CM | POA: Diagnosis not present

## 2017-03-09 DIAGNOSIS — I10 Essential (primary) hypertension: Secondary | ICD-10-CM | POA: Diagnosis not present

## 2017-03-09 DIAGNOSIS — Z471 Aftercare following joint replacement surgery: Secondary | ICD-10-CM | POA: Diagnosis not present

## 2017-03-09 DIAGNOSIS — Z6832 Body mass index (BMI) 32.0-32.9, adult: Secondary | ICD-10-CM | POA: Diagnosis not present

## 2017-03-09 DIAGNOSIS — F419 Anxiety disorder, unspecified: Secondary | ICD-10-CM | POA: Diagnosis not present

## 2017-03-09 DIAGNOSIS — B356 Tinea cruris: Secondary | ICD-10-CM | POA: Diagnosis not present

## 2017-03-10 DIAGNOSIS — Z471 Aftercare following joint replacement surgery: Secondary | ICD-10-CM | POA: Diagnosis not present

## 2017-03-10 DIAGNOSIS — I10 Essential (primary) hypertension: Secondary | ICD-10-CM | POA: Diagnosis not present

## 2017-03-10 DIAGNOSIS — M170 Bilateral primary osteoarthritis of knee: Secondary | ICD-10-CM | POA: Diagnosis not present

## 2017-03-10 DIAGNOSIS — F419 Anxiety disorder, unspecified: Secondary | ICD-10-CM | POA: Diagnosis not present

## 2017-03-10 DIAGNOSIS — E7800 Pure hypercholesterolemia, unspecified: Secondary | ICD-10-CM | POA: Diagnosis not present

## 2017-03-10 DIAGNOSIS — F329 Major depressive disorder, single episode, unspecified: Secondary | ICD-10-CM | POA: Diagnosis not present

## 2017-03-11 DIAGNOSIS — E7800 Pure hypercholesterolemia, unspecified: Secondary | ICD-10-CM | POA: Diagnosis not present

## 2017-03-11 DIAGNOSIS — F329 Major depressive disorder, single episode, unspecified: Secondary | ICD-10-CM | POA: Diagnosis not present

## 2017-03-11 DIAGNOSIS — F419 Anxiety disorder, unspecified: Secondary | ICD-10-CM | POA: Diagnosis not present

## 2017-03-11 DIAGNOSIS — Z471 Aftercare following joint replacement surgery: Secondary | ICD-10-CM | POA: Diagnosis not present

## 2017-03-11 DIAGNOSIS — M170 Bilateral primary osteoarthritis of knee: Secondary | ICD-10-CM | POA: Diagnosis not present

## 2017-03-11 DIAGNOSIS — I10 Essential (primary) hypertension: Secondary | ICD-10-CM | POA: Diagnosis not present

## 2017-03-13 DIAGNOSIS — F419 Anxiety disorder, unspecified: Secondary | ICD-10-CM | POA: Diagnosis not present

## 2017-03-13 DIAGNOSIS — Z471 Aftercare following joint replacement surgery: Secondary | ICD-10-CM | POA: Diagnosis not present

## 2017-03-13 DIAGNOSIS — M170 Bilateral primary osteoarthritis of knee: Secondary | ICD-10-CM | POA: Diagnosis not present

## 2017-03-13 DIAGNOSIS — E7800 Pure hypercholesterolemia, unspecified: Secondary | ICD-10-CM | POA: Diagnosis not present

## 2017-03-13 DIAGNOSIS — F329 Major depressive disorder, single episode, unspecified: Secondary | ICD-10-CM | POA: Diagnosis not present

## 2017-03-13 DIAGNOSIS — I10 Essential (primary) hypertension: Secondary | ICD-10-CM | POA: Diagnosis not present

## 2017-03-16 DIAGNOSIS — I10 Essential (primary) hypertension: Secondary | ICD-10-CM | POA: Diagnosis not present

## 2017-03-16 DIAGNOSIS — F329 Major depressive disorder, single episode, unspecified: Secondary | ICD-10-CM | POA: Diagnosis not present

## 2017-03-16 DIAGNOSIS — E7800 Pure hypercholesterolemia, unspecified: Secondary | ICD-10-CM | POA: Diagnosis not present

## 2017-03-16 DIAGNOSIS — Z471 Aftercare following joint replacement surgery: Secondary | ICD-10-CM | POA: Diagnosis not present

## 2017-03-16 DIAGNOSIS — M170 Bilateral primary osteoarthritis of knee: Secondary | ICD-10-CM | POA: Diagnosis not present

## 2017-03-16 DIAGNOSIS — F419 Anxiety disorder, unspecified: Secondary | ICD-10-CM | POA: Diagnosis not present

## 2017-03-17 DIAGNOSIS — M170 Bilateral primary osteoarthritis of knee: Secondary | ICD-10-CM | POA: Diagnosis not present

## 2017-03-17 DIAGNOSIS — M1812 Unilateral primary osteoarthritis of first carpometacarpal joint, left hand: Secondary | ICD-10-CM | POA: Diagnosis not present

## 2017-03-17 DIAGNOSIS — Z471 Aftercare following joint replacement surgery: Secondary | ICD-10-CM | POA: Diagnosis not present

## 2017-03-17 DIAGNOSIS — Z96612 Presence of left artificial shoulder joint: Secondary | ICD-10-CM | POA: Diagnosis not present

## 2017-03-18 DIAGNOSIS — E7800 Pure hypercholesterolemia, unspecified: Secondary | ICD-10-CM | POA: Diagnosis not present

## 2017-03-18 DIAGNOSIS — M170 Bilateral primary osteoarthritis of knee: Secondary | ICD-10-CM | POA: Diagnosis not present

## 2017-03-18 DIAGNOSIS — F329 Major depressive disorder, single episode, unspecified: Secondary | ICD-10-CM | POA: Diagnosis not present

## 2017-03-18 DIAGNOSIS — F419 Anxiety disorder, unspecified: Secondary | ICD-10-CM | POA: Diagnosis not present

## 2017-03-18 DIAGNOSIS — Z471 Aftercare following joint replacement surgery: Secondary | ICD-10-CM | POA: Diagnosis not present

## 2017-03-18 DIAGNOSIS — I10 Essential (primary) hypertension: Secondary | ICD-10-CM | POA: Diagnosis not present

## 2017-03-23 DIAGNOSIS — E7800 Pure hypercholesterolemia, unspecified: Secondary | ICD-10-CM | POA: Diagnosis not present

## 2017-03-23 DIAGNOSIS — F419 Anxiety disorder, unspecified: Secondary | ICD-10-CM | POA: Diagnosis not present

## 2017-03-23 DIAGNOSIS — M170 Bilateral primary osteoarthritis of knee: Secondary | ICD-10-CM | POA: Diagnosis not present

## 2017-03-23 DIAGNOSIS — Z471 Aftercare following joint replacement surgery: Secondary | ICD-10-CM | POA: Diagnosis not present

## 2017-03-23 DIAGNOSIS — F329 Major depressive disorder, single episode, unspecified: Secondary | ICD-10-CM | POA: Diagnosis not present

## 2017-03-23 DIAGNOSIS — I10 Essential (primary) hypertension: Secondary | ICD-10-CM | POA: Diagnosis not present

## 2017-03-25 DIAGNOSIS — F419 Anxiety disorder, unspecified: Secondary | ICD-10-CM | POA: Diagnosis not present

## 2017-03-25 DIAGNOSIS — I10 Essential (primary) hypertension: Secondary | ICD-10-CM | POA: Diagnosis not present

## 2017-03-25 DIAGNOSIS — M170 Bilateral primary osteoarthritis of knee: Secondary | ICD-10-CM | POA: Diagnosis not present

## 2017-03-25 DIAGNOSIS — F329 Major depressive disorder, single episode, unspecified: Secondary | ICD-10-CM | POA: Diagnosis not present

## 2017-03-25 DIAGNOSIS — E7800 Pure hypercholesterolemia, unspecified: Secondary | ICD-10-CM | POA: Diagnosis not present

## 2017-03-25 DIAGNOSIS — Z471 Aftercare following joint replacement surgery: Secondary | ICD-10-CM | POA: Diagnosis not present

## 2017-03-26 DIAGNOSIS — I10 Essential (primary) hypertension: Secondary | ICD-10-CM | POA: Diagnosis not present

## 2017-03-26 DIAGNOSIS — Z471 Aftercare following joint replacement surgery: Secondary | ICD-10-CM | POA: Diagnosis not present

## 2017-03-26 DIAGNOSIS — F419 Anxiety disorder, unspecified: Secondary | ICD-10-CM | POA: Diagnosis not present

## 2017-03-26 DIAGNOSIS — M170 Bilateral primary osteoarthritis of knee: Secondary | ICD-10-CM | POA: Diagnosis not present

## 2017-03-26 DIAGNOSIS — E7800 Pure hypercholesterolemia, unspecified: Secondary | ICD-10-CM | POA: Diagnosis not present

## 2017-03-26 DIAGNOSIS — F329 Major depressive disorder, single episode, unspecified: Secondary | ICD-10-CM | POA: Diagnosis not present

## 2017-03-30 ENCOUNTER — Ambulatory Visit: Payer: Medicare Other | Attending: Orthopedic Surgery | Admitting: Physical Therapy

## 2017-03-30 DIAGNOSIS — M25512 Pain in left shoulder: Secondary | ICD-10-CM | POA: Diagnosis not present

## 2017-03-30 DIAGNOSIS — M25612 Stiffness of left shoulder, not elsewhere classified: Secondary | ICD-10-CM | POA: Diagnosis not present

## 2017-03-30 NOTE — Therapy (Signed)
[facility] [place], [place], [address] Phone: [phone]   Fax:  [phone]  Physical Therapy Evaluation  Patient Details  Name: [name] MRN: [id number] Date of Birth: December 30, 1945 Referring Provider: Esmond Plants MD.  Encounter Date: 03/30/2017      PT End of Session - 03/30/17 1638    Visit Number 1   Number of Visits 12   Date for PT Re-Evaluation 05/29/17   PT Start Time 1030   PT Stop Time 1123   PT Time Calculation (min) 53 min   Activity Tolerance Patient tolerated treatment well   Behavior During Therapy [facility] for tasks assessed/performed      Past Medical History:  Diagnosis Date  . Anxiety   . Arthritis   . Depression   . GERD (gastroesophageal reflux disease)   . High cholesterol   . Hypertension   . Hypothyroid     Past Surgical History:  Procedure Laterality Date  . APPENDECTOMY    . BUNIONECTOMY    . COLONOSCOPY  06/18/2012   Procedure: COLONOSCOPY;  Surgeon: [name], MD;  [facility];  Service: Endoscopy;  Laterality: N/A;  730  . ESOPHAGOGASTRODUODENOSCOPY    . hammer toe repair    . HEMORRHOID SURGERY    . THYROIDECTOMY    . TONSILLECTOMY    . TOTAL ABDOMINAL HYSTERECTOMY    . TOTAL SHOULDER ARTHROPLASTY Left 02/06/2017  . TOTAL SHOULDER ARTHROPLASTY Left 02/06/2017   Procedure: TOTAL SHOULDER ARTHROPLASTY;  Surgeon: [name], MD;  Location: [facility];  Service: Orthopedics;  Laterality: Left;    There were no vitals filed for this visit.       Subjective Assessment - 03/30/17 1632    Subjective The patient underwent a left total shoulder replacement on 02/06/17.  She had some home health physical therapy and is compliant with a HEP.  She reports no pain at rest today.     Patient Stated Goals Use left UE without pain.   Currently in Pain? No/denies            [facility] PT Assessment - 03/30/17 0001      Assessment   Medical Diagnosis Left total  shoulder replacement.   Referring Provider Esmond Plants MD.   Onset Date/Surgical Date --  02/06/17 (surgery date).     Precautions   Precautions --  No ultrasound.     Restrictions   Weight Bearing Restrictions No     Balance Screen   Has the patient fallen in the past 6 months Yes   How many times? --  1.   Has the patient had a decrease in activity level because of a fear of falling?  No   Is the patient reluctant to leave their home because of a fear of falling?  No     Home Ecologist residence     Prior Function   Level of Independence Independent     Posture/Postural Control   Posture/Postural Control No significant limitations     ROM / Strength   AROM / PROM / Strength PROM     PROM   Overall PROM Comments In supine:  PAROM into left shoulder flexion= 108 degrees; ER= 18 degrees and IR to abdomen.     Palpation   Palpation comment Mild tenderness over left shoulder incisional site with minimal loss of scar mobility.     Ambulation/Gait   Gait Comments WNL.  [facility] Adult PT [facility]Exercise - 03/30/17 0001      Exercises   Exercises Shoulder     Modalities   Modalities Vasopneumatic     Vasopneumatic   Number Minutes Vasopneumatic  --  15.   Vasopnuematic Location  --  Left shoulder.   Vasopneumatic Pressure Medium     Manual Therapy   Manual Therapy Passive ROM   Manual therapy comments In supine:  Passive-assistive left shoulder flexion and ER with rhy stabs at 90 degrees x 8 minutes.                  PT Short Term Goals - 03/30/17 1649      PT SHORT TERM GOAL #1   Title STG's=LTG's.           PT Long Term Goals - 03/30/17 1649      PT LONG TERM GOAL #1   Title Independent with a HEP.   Time 6   Period Weeks   Status New     PT LONG TERM GOAL #2   Title Active right shoulder flexion to 145 degrees so the patient can easily reach overhead.   Time 6   Period  Weeks   Status New     PT LONG TERM GOAL #3   Title Active ER to 70 degrees+ to allow for easily donning/doffing of apparel   Time 6   Period Weeks   Status New     PT LONG TERM GOAL #4   Title Increase ROM so patient is able to reach behind back to L3.   Time 6   Period Weeks   Status New     PT LONG TERM GOAL #5   Title ase shoulder strength to a solid 4 to 4+/5 to increase stability for performance of functional activities   Time 6   Period Weeks   Status New     PT LONG TERM GOAL #6   Title Perform ADL's with pain not > 3/10.   Time 6   Period Weeks   Status New               Plan - 03/30/17 1645    Clinical Impression Statement The patient presents to OPPT s/p left total shoulder replacement performed on 02/06/17.  She has an expected loss of range of motion.  She reported no pain at rest today.  Her limitations prevent her from perfoming ADL's.  Patient will benefit from skilled physical therapy.   Rehab Potential Good   PT Frequency 2x / week   PT Duration 6 weeks   PT Treatment/Interventions ADLs/Self Care Home Management;Cryotherapy;Electrical Stimulation;Neuromuscular re-education;Therapeutic exercise;Therapeutic activities;Patient/family education;Manual techniques;Vasopneumatic Device;Passive range of motion   PT Next Visit Plan per MD referral:  AAROM to left shoulder; pulleys.  Electrical stimulation (if needed) and vasopneumatic.   Consulted and Agree with Plan of Care Patient      Patient will benefit from skilled therapeutic intervention in order to improve the following deficits and impairments:  Decreased activity tolerance, Decreased range of motion, Decreased strength, Pain  Visit Diagnosis: Acute pain of left shoulder - Plan: PT plan of care cert/re-cert  Stiffness of left shoulder, not elsewhere classified - Plan: PT plan of care cert/re-cert      G-Codes - 95/28/41 1652    Functional Assessment Tool Used (Outpatient Only) Clinical  judgement.   Functional Limitation Self care   Self Care Current Status (L2440) At least 60 percent but less than 80 percent  impaired, limited or restricted   Self Care Goal Status [phone]) At least 20 percent but less than 40 percent impaired, limited or restricted       Problem List Patient Active Problem List   Diagnosis Date Noted  . Constipation 02/10/2017  . Status post total shoulder arthroplasty, left 02/06/2017  . Elevated transaminase level 01/25/2013  . GERD (gastroesophageal reflux disease) 01/25/2013  . Blood in stool 06/15/2012  . Hypertension 06/15/2012  . High cholesterol 06/15/2012    Isebella Upshur, [place] MPT 03/30/2017, 6:15 PM  [facility] [address] [place], [place], [address] Phone: [phone]   Fax:  [phone]  Name: MYLISA BRUNSON MRN: [id number] Date of Birth: [birth year]

## 2017-03-31 DIAGNOSIS — M159 Polyosteoarthritis, unspecified: Secondary | ICD-10-CM | POA: Diagnosis not present

## 2017-03-31 DIAGNOSIS — I10 Essential (primary) hypertension: Secondary | ICD-10-CM | POA: Diagnosis not present

## 2017-03-31 DIAGNOSIS — E7800 Pure hypercholesterolemia, unspecified: Secondary | ICD-10-CM | POA: Diagnosis not present

## 2017-04-02 ENCOUNTER — Ambulatory Visit: Payer: Medicare Other | Admitting: Physical Therapy

## 2017-04-02 DIAGNOSIS — M25512 Pain in left shoulder: Secondary | ICD-10-CM | POA: Diagnosis not present

## 2017-04-02 DIAGNOSIS — M25612 Stiffness of left shoulder, not elsewhere classified: Secondary | ICD-10-CM | POA: Diagnosis not present

## 2017-04-02 NOTE — Therapy (Signed)
[facility] [place], [place], [address] Phone: [phone]   Fax:  [phone]  Physical Therapy Treatment  Patient Details  Name: [name] MRN: [id number] Date of Birth: 06/16/1946 Referring Provider: Esmond Plants MD.  Encounter Date: 04/02/2017      PT End of Session - 04/02/17 1151    Visit Number 2   Number of Visits 12   Date for PT Re-Evaluation 05/29/17   PT Start Time 0900   PT Stop Time 0949   PT Time Calculation (min) 49 min   Activity Tolerance Patient tolerated treatment well   Behavior During Therapy [facility] for tasks assessed/performed      Past Medical History:  Diagnosis Date  . Anxiety   . Arthritis   . Depression   . GERD (gastroesophageal reflux disease)   . High cholesterol   . Hypertension   . Hypothyroid     Past Surgical History:  Procedure Laterality Date  . APPENDECTOMY    . BUNIONECTOMY    . COLONOSCOPY  06/18/2012   Procedure: COLONOSCOPY;  Surgeon: [name], MD;  [facility];  Service: Endoscopy;  Laterality: N/A;  730  . ESOPHAGOGASTRODUODENOSCOPY    . hammer toe repair    . HEMORRHOID SURGERY    . THYROIDECTOMY    . TONSILLECTOMY    . TOTAL ABDOMINAL HYSTERECTOMY    . TOTAL SHOULDER ARTHROPLASTY Left 02/06/2017  . TOTAL SHOULDER ARTHROPLASTY Left 02/06/2017   Procedure: TOTAL SHOULDER ARTHROPLASTY;  Surgeon: [name], MD;  Location: [facility];  Service: Orthopedics;  Laterality: Left;    There were no vitals filed for this visit.      Subjective Assessment - 04/02/17 1150    Subjective No new complaints.   Patient Stated Goals Use left UE without pain.      Treatment:  UE Ranger seated x 5 minutes and pulleys x 5 minutes; supine cane exercise in bench press and flexion (2 minutes) f/b left shoulder PAROM into flexion and ER x 12 minutes f/b Pre-mod x 15 minutes.  Excellent job.                             PT Short Term  Goals - 03/30/17 1649      PT SHORT TERM GOAL #1   Title STG's=LTG's.           PT Long Term Goals - 03/30/17 1649      PT LONG TERM GOAL #1   Title Independent with a HEP.   Time 6   Period Weeks   Status New     PT LONG TERM GOAL #2   Title Active right shoulder flexion to 145 degrees so the patient can easily reach overhead.   Time 6   Period Weeks   Status New     PT LONG TERM GOAL #3   Title Active ER to 70 degrees+ to allow for easily donning/doffing of apparel   Time 6   Period Weeks   Status New     PT LONG TERM GOAL #4   Title Increase ROM so patient is able to reach behind back to L3.   Time 6   Period Weeks   Status New     PT LONG TERM GOAL #5   Title ase shoulder strength to a solid 4 to 4+/5 to increase stability for performance of functional activities   Time 6  Period Weeks   Status New     PT LONG TERM GOAL #6   Title Perform ADL's with pain not > 3/10.   Time 6   Period Weeks   Status New             Patient will benefit from skilled therapeutic intervention in order to improve the following deficits and impairments:  Decreased activity tolerance, Decreased range of motion, Decreased strength, Pain  Visit Diagnosis: Acute pain of left shoulder  Stiffness of left shoulder, not elsewhere classified     Problem List Patient Active Problem List   Diagnosis Date Noted  . Constipation 02/10/2017  . Status post total shoulder arthroplasty, left 02/06/2017  . Elevated transaminase level 01/25/2013  . GERD (gastroesophageal reflux disease) 01/25/2013  . Blood in stool 06/15/2012  . Hypertension 06/15/2012  . High cholesterol 06/15/2012    Gracie Gupta, [place] MPT 04/02/2017, 12:00 PM  [facility] [address] [place], [place], [address] Phone: [phone]   Fax:  [phone]  Name: [name] MRN: [id number] Date of Birth: 08-17-1946

## 2017-04-06 ENCOUNTER — Ambulatory Visit: Payer: Medicare Other | Admitting: Physical Therapy

## 2017-04-06 DIAGNOSIS — M25612 Stiffness of left shoulder, not elsewhere classified: Secondary | ICD-10-CM

## 2017-04-06 DIAGNOSIS — M25512 Pain in left shoulder: Secondary | ICD-10-CM | POA: Diagnosis not present

## 2017-04-06 NOTE — Patient Instructions (Signed)
Patient with home pulley system.  Recommended she use twice a day for 5 minutes each session.

## 2017-04-06 NOTE — Therapy (Signed)
[facility] [place], [place], [address] Phone: [phone]   Fax:  [phone]  Physical Therapy Treatment  Patient Details  Name: [name] MRN: [id number] Date of Birth: 11/20/1946 Referring Provider: Esmond Plants MD.  Encounter Date: 04/06/2017      PT End of Session - 04/06/17 1518    Visit Number 3   Number of Visits 12   Date for PT Re-Evaluation 05/29/17   PT Start Time 0145   PT Stop Time 0240   PT Time Calculation (min) 55 min   Activity Tolerance Patient tolerated treatment well   Behavior During Therapy [facility] for tasks assessed/performed      Past Medical History:  Diagnosis Date  . Anxiety   . Arthritis   . Depression   . GERD (gastroesophageal reflux disease)   . High cholesterol   . Hypertension   . Hypothyroid     Past Surgical History:  Procedure Laterality Date  . APPENDECTOMY    . BUNIONECTOMY    . COLONOSCOPY  06/18/2012   Procedure: COLONOSCOPY;  Surgeon: [name], MD;  [facility];  Service: Endoscopy;  Laterality: N/A;  730  . ESOPHAGOGASTRODUODENOSCOPY    . hammer toe repair    . HEMORRHOID SURGERY    . THYROIDECTOMY    . TONSILLECTOMY    . TOTAL ABDOMINAL HYSTERECTOMY    . TOTAL SHOULDER ARTHROPLASTY Left 02/06/2017  . TOTAL SHOULDER ARTHROPLASTY Left 02/06/2017   Procedure: TOTAL SHOULDER ARTHROPLASTY;  Surgeon: [name], MD;  Location: [place];  Service: Orthopedics;  Laterality: Left;    There were no vitals filed for this visit.      Subjective Assessment - 04/06/17 1515    Subjective Doing good.     Patient Stated Goals Use left UE without pain.   Currently in Pain? No/denies                         [facility] Adult PT Treatment/Exercise - 04/06/17 0001      Exercises   Exercises Shoulder     Shoulder Exercises: Pulleys   Flexion Limitations Pullleys and UE Ranger (seated) for a total of 12 minutes.     Modalities   Modalities Chiropractor Stimulation Location Left shoulder.   Electrical Stimulation Action Pre-mod.   Electrical Stimulation Parameters 80-150 Hz x 15 minutes.   Electrical Stimulation Goals Pain     Manual Therapy   Manual Therapy Passive ROM   Manual therapy comments In supine:  PAROM to patient's left shoulder into flexion and ER x 17 minutes.                  PT Short Term Goals - 03/30/17 1649      PT SHORT TERM GOAL #1   Title STG's=LTG's.           PT Long Term Goals - 03/30/17 1649      PT LONG TERM GOAL #1   Title Independent with a HEP.   Time 6   Period Weeks   Status New     PT LONG TERM GOAL #2   Title Active right shoulder flexion to 145 degrees so the patient can easily reach overhead.   Time 6   Period Weeks   Status New     PT LONG TERM GOAL #3   Title Active ER to 70 degrees+ to  allow for easily donning/doffing of apparel   Time 6   Period Weeks   Status New     PT LONG TERM GOAL #4   Title Increase ROM so patient is able to reach behind back to L3.   Time 6   Period Weeks   Status New     PT LONG TERM GOAL #5   Title ase shoulder strength to a solid 4 to 4+/5 to increase stability for performance of functional activities   Time 6   Period Weeks   Status New     PT LONG TERM GOAL #6   Title Perform ADL's with pain not > 3/10.   Time 6   Period Weeks   Status New               Plan - 04/06/17 1519    Clinical Impression Statement Excellent job today.        Patient will benefit from skilled therapeutic intervention in order to improve the following deficits and impairments:  Decreased activity tolerance, Decreased range of motion, Decreased strength, Pain  Visit Diagnosis: Stiffness of left shoulder, not elsewhere classified     Problem List Patient Active Problem List   Diagnosis Date Noted  . Constipation 02/10/2017  . Status post total shoulder  arthroplasty, left 02/06/2017  . Elevated transaminase level 01/25/2013  . GERD (gastroesophageal reflux disease) 01/25/2013  . Blood in stool 06/15/2012  . Hypertension 06/15/2012  . High cholesterol 06/15/2012    Grayer Sproles, [place] MPT 04/06/2017, 3:20 PM  [facility] [address] [place], [place], [address] Phone: [phone]   Fax:  [phone]  Name: [name] MRN: [id number] Date of Birth: [birth year]

## 2017-04-09 ENCOUNTER — Ambulatory Visit: Payer: Medicare Other | Admitting: *Deleted

## 2017-04-09 DIAGNOSIS — M25512 Pain in left shoulder: Secondary | ICD-10-CM | POA: Diagnosis not present

## 2017-04-09 DIAGNOSIS — M25612 Stiffness of left shoulder, not elsewhere classified: Secondary | ICD-10-CM

## 2017-04-09 NOTE — Therapy (Signed)
[facility] [place], [place], [address] Phone: [phone]   Fax:  [phone]  Physical Therapy Treatment  Patient Details  Name: [name] MRN: [id number] Date of Birth: February 10, 1946 Referring Provider: Esmond Plants MD.  Encounter Date: 04/09/2017      PT End of Session - 04/09/17 0903    Visit Number 4   Number of Visits 12   Date for PT Re-Evaluation 05/29/17   PT Start Time 0900   PT Stop Time 0953   PT Time Calculation (min) 53 min      Past Medical History:  Diagnosis Date  . Anxiety   . Arthritis   . Depression   . GERD (gastroesophageal reflux disease)   . High cholesterol   . Hypertension   . Hypothyroid     Past Surgical History:  Procedure Laterality Date  . APPENDECTOMY    . BUNIONECTOMY    . COLONOSCOPY  06/18/2012   Procedure: COLONOSCOPY;  Surgeon: [name], MD;  [facility];  Service: Endoscopy;  Laterality: N/A;  730  . ESOPHAGOGASTRODUODENOSCOPY    . hammer toe repair    . HEMORRHOID SURGERY    . THYROIDECTOMY    . TONSILLECTOMY    . TOTAL ABDOMINAL HYSTERECTOMY    . TOTAL SHOULDER ARTHROPLASTY Left 02/06/2017  . TOTAL SHOULDER ARTHROPLASTY Left 02/06/2017   Procedure: TOTAL SHOULDER ARTHROPLASTY;  Surgeon: [name], MD;  Location: [place];  Service: Orthopedics;  Laterality: Left;    There were no vitals filed for this visit.      Subjective Assessment - 04/09/17 0902    Subjective Did ok after last Rx. Mainly sore   Patient Stated Goals Use left UE without pain.   Currently in Pain? Yes   Pain Score 2    Pain Location Shoulder   Pain Orientation Left                         [facility] Adult PT Treatment/Exercise - 04/09/17 0001      Exercises   Exercises Shoulder     Shoulder Exercises: Pulleys   Flexion Limitations Pullleys and UE Ranger (seated) for a total of 10 minutes.. 5 mins  each     Modalities   Modalities Electrical  Stimulation     Electrical Stimulation   Electrical Stimulation Location Left shoulder. Premod x 15 mins 80-150hz     Electrical Stimulation Goals Pain     Vasopneumatic   Number Minutes Vasopneumatic  15 minutes   Vasopnuematic Location  Shoulder   Vasopneumatic Pressure Medium   Vasopneumatic Temperature  36     Manual Therapy   Manual Therapy Passive ROM   Manual therapy comments In supine:  PAROM to patient's left shoulder into flexion and ER . Rhythmic  stab  for ER/IR and Flexion and extension.  Flexion 130 degrees and ER to 40 degrees                  PT Short Term Goals - 03/30/17 1649      PT SHORT TERM GOAL #1   Title STG's=LTG's.           PT Long Term Goals - 03/30/17 1649      PT LONG TERM GOAL #1   Title Independent with a HEP.   Time 6   Period Weeks   Status New     PT LONG TERM GOAL #2   Title  Active right shoulder flexion to 145 degrees so the patient can easily reach overhead.   Time 6   Period Weeks   Status New     PT LONG TERM GOAL #3   Title Active ER to 70 degrees+ to allow for easily donning/doffing of apparel   Time 6   Period Weeks   Status New     PT LONG TERM GOAL #4   Title Increase ROM so patient is able to reach behind back to L3.   Time 6   Period Weeks   Status New     PT LONG TERM GOAL #5   Title ase shoulder strength to a solid 4 to 4+/5 to increase stability for performance of functional activities   Time 6   Period Weeks   Status New     PT LONG TERM GOAL #6   Title Perform ADL's with pain not > 3/10.   Time 6   Period Weeks   Status New               Plan - 04/09/17 6151    Clinical Impression Statement Pt did fairly well today with AAROM and manual techniques. She was challenged with Rhythmic stab for IR and ER, but no complaints of pain ROM was 135 degrees and ER to 40 degrees   Rehab Potential Good   PT Frequency 2x / week   PT Duration 6 weeks   PT Treatment/Interventions ADLs/Self Care  Home Management;Cryotherapy;Electrical Stimulation;Neuromuscular re-education;Therapeutic exercise;Therapeutic activities;Patient/family education;Manual techniques;Vasopneumatic Device;Passive range of motion   PT Next Visit Plan per MD referral:  AAROM to left shoulder; pulleys.  Electrical stimulation (if needed) and vasopneumatic.   Consulted and Agree with Plan of Care Patient      Patient will benefit from skilled therapeutic intervention in order to improve the following deficits and impairments:  Decreased activity tolerance, Decreased range of motion, Decreased strength, Pain  Visit Diagnosis: Stiffness of left shoulder, not elsewhere classified  Acute pain of left shoulder     Problem List Patient Active Problem List   Diagnosis Date Noted  . Constipation 02/10/2017  . Status post total shoulder arthroplasty, left 02/06/2017  . Elevated transaminase level 01/25/2013  . GERD (gastroesophageal reflux disease) 01/25/2013  . Blood in stool 06/15/2012  . Hypertension 06/15/2012  . High cholesterol 06/15/2012    RAMSEUR,CHRIS, PTA 04/09/2017, 10:17 AM  [facility] [place], [place], [address] Phone: [phone]   Fax:  [phone]  Name: [name] MRN: [id number] Date of Birth: 06/09/1946

## 2017-04-13 ENCOUNTER — Ambulatory Visit: Payer: Medicare Other | Admitting: Physical Therapy

## 2017-04-13 ENCOUNTER — Encounter: Payer: Self-pay | Admitting: Physical Therapy

## 2017-04-13 DIAGNOSIS — M25512 Pain in left shoulder: Secondary | ICD-10-CM | POA: Diagnosis not present

## 2017-04-13 DIAGNOSIS — M25612 Stiffness of left shoulder, not elsewhere classified: Secondary | ICD-10-CM

## 2017-04-13 NOTE — Therapy (Signed)
[facility] [place], [place], [address] Phone: [phone]   Fax:  [phone]  Physical Therapy Treatment  Patient Details  Name: [name] MRN: [id number] Date of Birth: [birth year] Referring Provider: Esmond Plants MD.  Encounter Date: 04/13/2017      PT End of Session - 04/13/17 0902    Visit Number 5   Number of Visits 12   Date for PT Re-Evaluation 05/29/17   PT Start Time 0901   PT Stop Time 0950   PT Time Calculation (min) 49 min   Activity Tolerance Patient tolerated treatment well   Behavior During Therapy [facility] for tasks assessed/performed      Past Medical History:  Diagnosis Date  . Anxiety   . Arthritis   . Depression   . GERD (gastroesophageal reflux disease)   . High cholesterol   . Hypertension   . Hypothyroid     Past Surgical History:  Procedure Laterality Date  . APPENDECTOMY    . BUNIONECTOMY    . COLONOSCOPY  06/18/2012   Procedure: COLONOSCOPY;  Surgeon: [name], MD;  [facility];  Service: Endoscopy;  Laterality: N/A;  730  . ESOPHAGOGASTRODUODENOSCOPY    . hammer toe repair    . HEMORRHOID SURGERY    . THYROIDECTOMY    . TONSILLECTOMY    . TOTAL ABDOMINAL HYSTERECTOMY    . TOTAL SHOULDER ARTHROPLASTY Left 02/06/2017  . TOTAL SHOULDER ARTHROPLASTY Left 02/06/2017   Procedure: TOTAL SHOULDER ARTHROPLASTY;  Surgeon: [name], MD;  Location: [place];  Service: Orthopedics;  Laterality: Left;    There were no vitals filed for this visit.      Subjective Assessment - 04/13/17 0901    Subjective Reports " a little tight but its a lot better than it was."   Patient Stated Goals Use left UE without pain.   Currently in Pain? Yes   Pain Score 3    Pain Location Shoulder   Pain Orientation Left   Pain Descriptors / Indicators Tightness   Pain Type Surgical pain   Pain Frequency Constant   Aggravating Factors  Weather   Pain Relieving Factors Rest             [facility] PT Assessment - 04/13/17 0001      Assessment   Medical Diagnosis Left total shoulder replacement.   Onset Date/Surgical Date 02/06/17   Next MD Visit 04/15/2017     Restrictions   Weight Bearing Restrictions No     ROM / Strength   AROM / PROM / Strength PROM;AROM     AROM   Overall AROM  Deficits   AROM Assessment Site Shoulder   Right/Left Shoulder Left   Left Shoulder Flexion 95 Degrees   Left Shoulder Internal Rotation 66 Degrees   Left Shoulder External Rotation 56 Degrees     PROM   Overall PROM  Deficits   PROM Assessment Site Shoulder   Right/Left Shoulder Left   Left Shoulder Flexion 120 Degrees   Left Shoulder Internal Rotation 72 Degrees   Left Shoulder External Rotation 58 Degrees                     [facility] Adult PT Treatment/Exercise - 04/13/17 0001      Shoulder Exercises: Supine   Protraction AAROM;Both   Protraction Limitations 3x10 reps   External Rotation AAROM;Left  3x10 rpes   Flexion AAROM;Both   Flexion Limitations 3x10 reps  Shoulder Exercises: Seated   Other Seated Exercises LUE table slides into flexion x30 reps     Shoulder Exercises: Pulleys   Flexion Other (comment)  x5 min     Modalities   Modalities Electrical Stimulation;Vasopneumatic     Electrical Stimulation   Electrical Stimulation Location L shoulder   Electrical Stimulation Action Pre-Mod   Electrical Stimulation Parameters 80-150 hz x15 min   Electrical Stimulation Goals Pain     Vasopneumatic   Number Minutes Vasopneumatic  15 minutes   Vasopnuematic Location  Shoulder   Vasopneumatic Pressure Medium   Vasopneumatic Temperature  53     Manual Therapy   Manual Therapy Passive ROM   Passive ROM PROM of R shoulder into flex/ER/IR with gentle holds at end range                  PT Short Term Goals - 03/30/17 1649      PT SHORT TERM GOAL #1   Title STG's=LTG's.           PT Long Term Goals - 04/13/17 0940       PT LONG TERM GOAL #1   Title Independent with a HEP.   Time 6   Period Weeks   Status On-going     PT LONG TERM GOAL #2   Title Active right shoulder flexion to 145 degrees so the patient can easily reach overhead.   Time 6   Period Weeks   Status On-going  AROM L shoulder 95 deg 04/13/2017     PT LONG TERM GOAL #3   Title Active ER to 70 degrees+ to allow for easily donning/doffing of apparel   Time 6   Period Weeks   Status On-going  AROM L shoulder 56 deg 04/13/2017     PT LONG TERM GOAL #4   Title Increase ROM so patient is able to reach behind back to L3.   Time 6   Period Weeks   Status On-going     PT LONG TERM GOAL #5   Title ase shoulder strength to a solid 4 to 4+/5 to increase stability for performance of functional activities   Time 6   Period Weeks   Status On-going     PT LONG TERM GOAL #6   Title Perform ADL's with pain not > 3/10.   Time 6   Period Weeks   Status On-going               Plan - 04/13/17 9767    Clinical Impression Statement Patient tolerated today's treatment well although she is still experiencing limits with ROM. Patient experienced pain with end range PROM and AAROM into flex/ER per patient report. Minimal increased tone in L deltoids region but none palpated or reported in L Latissimus Dorsi. PROM measurements have improved as can be seen above in treatment note. Normal modalities response noted following removal of the modalities. Goals remain on-going secondary to continue ROM and strength deficits as well as pain.   Rehab Potential Good   PT Frequency 2x / week   PT Duration 6 weeks   PT Treatment/Interventions ADLs/Self Care Home Management;Cryotherapy;Electrical Stimulation;Neuromuscular re-education;Therapeutic exercise;Therapeutic activities;Patient/family education;Manual techniques;Vasopneumatic Device;Passive range of motion   PT Next Visit Plan per MD referral:  AAROM to left shoulder; pulleys.  Electrical stimulation  (if needed) and vasopneumatic.   Consulted and Agree with Plan of Care Patient      Patient will benefit from skilled therapeutic intervention in order to improve  the following deficits and impairments:  Decreased activity tolerance, Decreased range of motion, Decreased strength, Pain  Visit Diagnosis: Stiffness of left shoulder, not elsewhere classified  Acute pain of left shoulder     Problem List Patient Active Problem List   Diagnosis Date Noted  . Constipation 02/10/2017  . Status post total shoulder arthroplasty, left 02/06/2017  . Elevated transaminase level 01/25/2013  . GERD (gastroesophageal reflux disease) 01/25/2013  . Blood in stool 06/15/2012  . Hypertension 06/15/2012  . High cholesterol 06/15/2012    Ahmed Prima, PTA 04/13/17 10:57 AM [place]i Applegate MPT [facility] [place], [place], [address] Phone: [phone]   Fax:  [phone]  Name: GEARLDENE FIORENZA MRN: [id number] Date of Birth: 08/05/1946

## 2017-04-15 DIAGNOSIS — Z96612 Presence of left artificial shoulder joint: Secondary | ICD-10-CM | POA: Diagnosis not present

## 2017-04-15 DIAGNOSIS — Z471 Aftercare following joint replacement surgery: Secondary | ICD-10-CM | POA: Diagnosis not present

## 2017-04-16 ENCOUNTER — Ambulatory Visit: Payer: Medicare Other | Admitting: *Deleted

## 2017-04-16 DIAGNOSIS — M25512 Pain in left shoulder: Secondary | ICD-10-CM | POA: Diagnosis not present

## 2017-04-16 DIAGNOSIS — M25612 Stiffness of left shoulder, not elsewhere classified: Secondary | ICD-10-CM

## 2017-04-16 NOTE — Therapy (Signed)
[facility] [place], [place], [address] Phone: [phone]   Fax:  [phone]  Physical Therapy Treatment  Patient Details  Name: [name] MRN: [id number] Date of Birth: [birth year] Referring Provider: Esmond Plants MD.  Encounter Date: 04/16/2017      PT End of Session - 04/16/17 1114    Visit Number 6   Number of Visits 12   Date for PT Re-Evaluation 05/29/17   PT Start Time 0901   PT Stop Time 0959   PT Time Calculation (min) 58 min      Past Medical History:  Diagnosis Date  . Anxiety   . Arthritis   . Depression   . GERD (gastroesophageal reflux disease)   . High cholesterol   . Hypertension   . Hypothyroid     Past Surgical History:  Procedure Laterality Date  . APPENDECTOMY    . BUNIONECTOMY    . COLONOSCOPY  06/18/2012   Procedure: COLONOSCOPY;  Surgeon: [name], MD;  [facility];  Service: Endoscopy;  Laterality: N/A;  730  . ESOPHAGOGASTRODUODENOSCOPY    . hammer toe repair    . HEMORRHOID SURGERY    . THYROIDECTOMY    . TONSILLECTOMY    . TOTAL ABDOMINAL HYSTERECTOMY    . TOTAL SHOULDER ARTHROPLASTY Left 02/06/2017  . TOTAL SHOULDER ARTHROPLASTY Left 02/06/2017   Procedure: TOTAL SHOULDER ARTHROPLASTY;  Surgeon: [name], MD;  Location: [place];  Service: Orthopedics;  Laterality: Left;    There were no vitals filed for this visit.      Subjective Assessment - 04/16/17 0911    Subjective Reports " a little tight but its a lot better than it was."   MD F/U  yesterday and N.O see in media   Patient Stated Goals Use left UE without pain.   Currently in Pain? Yes   Pain Score 2    Pain Location Shoulder   Pain Orientation Left   Pain Descriptors / Indicators Tightness   Pain Type Surgical pain   Pain Frequency Constant                         [facility] Adult PT Treatment/Exercise - 04/16/17 0001      Exercises   Exercises Shoulder     Shoulder Exercises: Standing   External Rotation Strengthening;Left;20 reps   Theraband Level (Shoulder External Rotation) Level 1 (Yellow)   Internal Rotation Strengthening;20 reps   Theraband Level (Shoulder Internal Rotation) Level 1 (Yellow)   Flexion Strengthening;Left;20 reps  punches   Theraband Level (Shoulder Flexion) Level 1 (Yellow)   Row Strengthening;Left;20 reps;Theraband   Theraband Level (Shoulder Row) Level 1 (Yellow)     Shoulder Exercises: Pulleys   Flexion Other (comment)  x5 min   Other Pulley Exercises standing UE ranger x 5 mins flexion and circles     Modalities   Modalities Electrical Stimulation;Vasopneumatic     Electrical Stimulation   Electrical Stimulation Location Left shoulder. Premod x 15 mins 80-150hz     Electrical Stimulation Goals Pain     Vasopneumatic   Number Minutes Vasopneumatic  15 minutes   Vasopnuematic Location  Shoulder   Vasopneumatic Pressure Medium   Vasopneumatic Temperature  36     Manual Therapy   Manual Therapy Passive ROM   Passive ROM PROM of R shoulder into flex/ER/IR with gentle holds at end range  PT Short Term Goals - 03/30/17 1649      PT SHORT TERM GOAL #1   Title STG's=LTG's.           PT Long Term Goals - 04/13/17 0940      PT LONG TERM GOAL #1   Title Independent with a HEP.   Time 6   Period Weeks   Status On-going     PT LONG TERM GOAL #2   Title Active right shoulder flexion to 145 degrees so the patient can easily reach overhead.   Time 6   Period Weeks   Status On-going  AROM L shoulder 95 deg 04/13/2017     PT LONG TERM GOAL #3   Title Active ER to 70 degrees+ to allow for easily donning/doffing of apparel   Time 6   Period Weeks   Status On-going  AROM L shoulder 56 deg 04/13/2017     PT LONG TERM GOAL #4   Title Increase ROM so patient is able to reach behind back to L3.   Time 6   Period Weeks   Status On-going     PT LONG TERM GOAL #5   Title ase  shoulder strength to a solid 4 to 4+/5 to increase stability for performance of functional activities   Time 6   Period Weeks   Status On-going     PT LONG TERM GOAL #6   Title Perform ADL's with pain not > 3/10.   Time 6   Period Weeks   Status On-going               Plan - 04/16/17 1116    Clinical Impression Statement Pt arrived with N.O. taday from MD. MD very pleasedwith status and Added Tband Exs to protocol. Pt did well with yellow Tband and RW 4 was issued for HEP.  Goals are ongoing   Rehab Potential Good   PT Frequency 2x / week   PT Duration 6 weeks   PT Treatment/Interventions ADLs/Self Care Home Management;Cryotherapy;Electrical Stimulation;Neuromuscular re-education;Therapeutic exercise;Therapeutic activities;Patient/family education;Manual techniques;Vasopneumatic Device;Passive range of motion   PT Next Visit Plan per MD referral:  AAROM to left shoulder; pulleys.  See N.O.  in media  start Tband   Consulted and Agree with Plan of Care Patient      Patient will benefit from skilled therapeutic intervention in order to improve the following deficits and impairments:  Decreased activity tolerance, Decreased range of motion, Decreased strength, Pain  Visit Diagnosis: Stiffness of left shoulder, not elsewhere classified  Acute pain of left shoulder     Problem List Patient Active Problem List   Diagnosis Date Noted  . Constipation 02/10/2017  . Status post total shoulder arthroplasty, left 02/06/2017  . Elevated transaminase level 01/25/2013  . GERD (gastroesophageal reflux disease) 01/25/2013  . Blood in stool 06/15/2012  . Hypertension 06/15/2012  . High cholesterol 06/15/2012    Bell Carbo,CHRIS, PTA 04/16/2017, 11:23 AM  [facility] [address] [place], [place], [address] Phone: [phone]   Fax:  [phone]  Name: [name] MRN: [id number] Date of Birth: November 22, 1946

## 2017-04-22 ENCOUNTER — Ambulatory Visit: Payer: Medicare Other | Admitting: Physical Therapy

## 2017-04-22 DIAGNOSIS — M25612 Stiffness of left shoulder, not elsewhere classified: Secondary | ICD-10-CM | POA: Diagnosis not present

## 2017-04-22 DIAGNOSIS — M25512 Pain in left shoulder: Secondary | ICD-10-CM

## 2017-04-22 NOTE — Therapy (Deleted)
[facility] [place], [place], [address] Phone: [phone]   Fax:  [phone]  Apr 22, 2017   @[email or web address] @  Physical Therapy Discharge Summary  Patient: KATHE WIRICK  MRN: [id number]  Date of Birth: [birth year]   Diagnosis: Stiffness of left shoulder, not elsewhere classified  Acute pain of left shoulder Referring Provider: Esmond Plants MD.  The above patient had been seen in Physical Therapy *** times of *** treatments scheduled with *** no shows and *** cancellations.  The treatment consisted of *** The patient is: {improved/worse/unchanged:[id number]}  Subjective: ***  Discharge Findings: ***  Functional Status at Discharge: ***  {UNGBM:[id number]}    Sincerely,   APPLEGATE, [place], PT   CC @[email or web address] @  [facility] [place], [place], [address] Phone: [phone]   Fax:  [phone]  Patient: KITZIA CAMUS  MRN: [id number]  Date of Birth: [birth year]

## 2017-04-23 NOTE — Therapy (Signed)
[facility] [place], [place], [address] Phone: [phone]   Fax:  [phone]  Physical Therapy Treatment  Patient Details  Name: [name] MRN: [id number] Date of Birth: February 08, 1946 Referring Provider: Esmond Plants MD.  Encounter Date: 04/22/2017      PT End of Session - 04/22/17 1036    Visit Number 7   Number of Visits 12   Date for PT Re-Evaluation 05/29/17   PT Start Time 0900   PT Stop Time 0949   PT Time Calculation (min) 49 min   Activity Tolerance Patient tolerated treatment well   Behavior During Therapy [facility] for tasks assessed/performed      Past Medical History:  Diagnosis Date  . Anxiety   . Arthritis   . Depression   . GERD (gastroesophageal reflux disease)   . High cholesterol   . Hypertension   . Hypothyroid     Past Surgical History:  Procedure Laterality Date  . APPENDECTOMY    . BUNIONECTOMY    . COLONOSCOPY  06/18/2012   Procedure: COLONOSCOPY;  Surgeon: [name], MD;  [facility];  Service: Endoscopy;  Laterality: N/A;  730  . ESOPHAGOGASTRODUODENOSCOPY    . hammer toe repair    . HEMORRHOID SURGERY    . THYROIDECTOMY    . TONSILLECTOMY    . TOTAL ABDOMINAL HYSTERECTOMY    . TOTAL SHOULDER ARTHROPLASTY Left 02/06/2017  . TOTAL SHOULDER ARTHROPLASTY Left 02/06/2017   Procedure: TOTAL SHOULDER ARTHROPLASTY;  Surgeon: [name], MD;  Location: [place];  Service: Orthopedics;  Laterality: Left;    There were no vitals filed for this visit.      Subjective Assessment - 04/22/17 0957    Subjective I started those new exercises with the band and I did them yesterday so I'm stiff today.  Requesting to go easier today.   Patient Stated Goals Use left UE without pain.   Pain Score 4    Pain Location Shoulder       Shoulder Exercises: Pulleys  Flexion Limitations  6 minutes.          Programme researcher, broadcasting/film/video Location  -             Left shoulder.          Electrical Stimulation Action  IFC          Electrical Stimulation Parameters  80-150 Hz x 20 minutes.          Electrical Stimulation Goals  Pain          Vasopneumatic  Number Minutes Vasopneumatic   -          Vasopnuematic Location   -          Vasopneumatic Pressure  -          Manual Therapy  Manual Therapy  Passive ROM          Passive ROM  In supine: PAROM into left shoulder flex- ion, ER/IR x 17 minutes and rhy stabs.                                         PT Short Term Goals - 03/30/17 1649      PT SHORT TERM GOAL #1   Title STG's=LTG's.           PT Long Term Goals -  04/13/17 0940      PT LONG TERM GOAL #1   Title Independent with a HEP.   Time 6   Period Weeks   Status On-going     PT LONG TERM GOAL #2   Title Active right shoulder flexion to 145 degrees so the patient can easily reach overhead.   Time 6   Period Weeks   Status On-going  AROM L shoulder 95 deg 04/13/2017     PT LONG TERM GOAL #3   Title Active ER to 70 degrees+ to allow for easily donning/doffing of apparel   Time 6   Period Weeks   Status On-going  AROM L shoulder 56 deg 04/13/2017     PT LONG TERM GOAL #4   Title Increase ROM so patient is able to reach behind back to L3.   Time 6   Period Weeks   Status On-going     PT LONG TERM GOAL #5   Title ase shoulder strength to a solid 4 to 4+/5 to increase stability for performance of functional activities   Time 6   Period Weeks   Status On-going     PT LONG TERM GOAL #6   Title Perform ADL's with pain not > 3/10.   Time 6   Period Weeks   Status On-going             Patient will benefit from skilled therapeutic intervention in order to improve the following deficits and impairments:     Visit Diagnosis: Stiffness of left shoulder, not elsewhere classified  Acute pain of left shoulder     Problem List Patient Active Problem List   Diagnosis Date Noted   . Constipation 02/10/2017  . Status post total shoulder arthroplasty, left 02/06/2017  . Elevated transaminase level 01/25/2013  . GERD (gastroesophageal reflux disease) 01/25/2013  . Blood in stool 06/15/2012  . Hypertension 06/15/2012  . High cholesterol 06/15/2012    Salaam Battershell, [place] MPT 04/23/2017, 12:31 PM  [facility] [place], [place], [address] Phone: [phone]   Fax:  [phone]  Name: Deborah MIRES MRN: [id number] Date of Birth: 1946/07/23

## 2017-04-27 ENCOUNTER — Ambulatory Visit: Payer: Medicare Other | Attending: Orthopedic Surgery | Admitting: *Deleted

## 2017-04-27 DIAGNOSIS — M25612 Stiffness of left shoulder, not elsewhere classified: Secondary | ICD-10-CM | POA: Diagnosis not present

## 2017-04-27 DIAGNOSIS — Z6832 Body mass index (BMI) 32.0-32.9, adult: Secondary | ICD-10-CM | POA: Diagnosis not present

## 2017-04-27 DIAGNOSIS — Z299 Encounter for prophylactic measures, unspecified: Secondary | ICD-10-CM | POA: Diagnosis not present

## 2017-04-27 DIAGNOSIS — Z713 Dietary counseling and surveillance: Secondary | ICD-10-CM | POA: Diagnosis not present

## 2017-04-27 DIAGNOSIS — L821 Other seborrheic keratosis: Secondary | ICD-10-CM | POA: Diagnosis not present

## 2017-04-27 DIAGNOSIS — M25512 Pain in left shoulder: Secondary | ICD-10-CM | POA: Insufficient documentation

## 2017-04-27 NOTE — Therapy (Signed)
[facility] [place], [place], [address] Phone: [phone]   Fax:  [phone]  Physical Therapy Treatment  Patient Details  Name: [name] MRN: [id number] Date of Birth: 23-Aug-1946 Referring Provider: Esmond Plants MD.  Encounter Date: 04/27/2017      PT End of Session - 04/27/17 1043    Visit Number 8   Number of Visits 12   Date for PT Re-Evaluation 05/29/17   PT Start Time 1030   PT Stop Time 1133   PT Time Calculation (min) 63 min      Past Medical History:  Diagnosis Date  . Anxiety   . Arthritis   . Depression   . GERD (gastroesophageal reflux disease)   . High cholesterol   . Hypertension   . Hypothyroid     Past Surgical History:  Procedure Laterality Date  . APPENDECTOMY    . BUNIONECTOMY    . COLONOSCOPY  06/18/2012   Procedure: COLONOSCOPY;  Surgeon: [name], MD;  [facility];  Service: Endoscopy;  Laterality: N/A;  730  . ESOPHAGOGASTRODUODENOSCOPY    . hammer toe repair    . HEMORRHOID SURGERY    . THYROIDECTOMY    . TONSILLECTOMY    . TOTAL ABDOMINAL HYSTERECTOMY    . TOTAL SHOULDER ARTHROPLASTY Left 02/06/2017  . TOTAL SHOULDER ARTHROPLASTY Left 02/06/2017   Procedure: TOTAL SHOULDER ARTHROPLASTY;  Surgeon: [name], MD;  Location: [place];  Service: Orthopedics;  Laterality: Left;    There were no vitals filed for this visit.      Subjective Assessment - 04/27/17 1041    Patient Stated Goals Use left UE without pain.   Currently in Pain? Yes   Pain Score 3    Pain Location Shoulder   Pain Orientation Left   Pain Descriptors / Indicators Tightness   Pain Type Surgical pain                         [facility] Adult PT Treatment/Exercise - 04/27/17 0001      Shoulder Exercises: Standing   External Rotation Strengthening;Left;20 reps   Theraband Level (Shoulder External Rotation) Level 1 (Yellow)   Internal Rotation Strengthening;20 reps    Theraband Level (Shoulder Internal Rotation) Level 1 (Yellow)   Flexion Strengthening;Left;20 reps  punches   Theraband Level (Shoulder Flexion) Level 1 (Yellow)   Row Strengthening;Left;20 reps;Theraband   Theraband Level (Shoulder Row) Level 1 (Yellow)     Shoulder Exercises: Pulleys   Flexion Limitations 6 minutes.   Other Pulley Exercises standing UE ranger x 5 mins flexion and circles     Modalities   Modalities Electrical Stimulation;Vasopneumatic     Electrical Stimulation   Electrical Stimulation Location Left shoulder. Premod x 15 mins 80-150hz     Electrical Stimulation Goals Pain     Vasopneumatic   Number Minutes Vasopneumatic  15 minutes   Vasopnuematic Location  Shoulder   Vasopneumatic Pressure Medium   Vasopneumatic Temperature  36     Manual Therapy   Manual Therapy Passive ROM   Passive ROM In supine:  PAROM / AAROM into left shoulder flexion, ER/IR  and rhy stabs.. Contract/ relax technique also used to increase ROM                  PT Short Term Goals - 03/30/17 1649      PT SHORT TERM GOAL #1   Title STG's=LTG's.  PT Long Term Goals - 04/13/17 0940      PT LONG TERM GOAL #1   Title Independent with a HEP.   Time 6   Period Weeks   Status On-going     PT LONG TERM GOAL #2   Title Active right shoulder flexion to 145 degrees so the patient can easily reach overhead.   Time 6   Period Weeks   Status On-going  AROM L shoulder 95 deg 04/13/2017     PT LONG TERM GOAL #3   Title Active ER to 70 degrees+ to allow for easily donning/doffing of apparel   Time 6   Period Weeks   Status On-going  AROM L shoulder 56 deg 04/13/2017     PT LONG TERM GOAL #4   Title Increase ROM so patient is able to reach behind back to L3.   Time 6   Period Weeks   Status On-going     PT LONG TERM GOAL #5   Title ase shoulder strength to a solid 4 to 4+/5 to increase stability for performance of functional activities   Time 6   Period  Weeks   Status On-going     PT LONG TERM GOAL #6   Title Perform ADL's with pain not > 3/10.   Time 6   Period Weeks   Status On-going               Plan - 04/27/17 1044    Clinical Impression Statement Pt arrived to clinic today with lower pain levels than last Rx and was able to complete therex for LT shldr. She did fairly well with RW4 tband exs, but needed v/c's for technique. PROM tday for flexion was 140 degrees and ER to 65 degrees in scaption. Normal response with  modalities.   Rehab Potential Good   PT Frequency 2x / week   PT Duration 6 weeks   PT Treatment/Interventions ADLs/Self Care Home Management;Cryotherapy;Electrical Stimulation;Neuromuscular re-education;Therapeutic exercise;Therapeutic activities;Patient/family education;Manual techniques;Vasopneumatic Device;Passive range of motion   PT Next Visit Plan per MD referral:  AAROM to left shoulder; pulleys.  See N.O.  in media  start Tband   Consulted and Agree with Plan of Care Patient      Patient will benefit from skilled therapeutic intervention in order to improve the following deficits and impairments:  Decreased activity tolerance, Decreased range of motion, Decreased strength, Pain  Visit Diagnosis: Stiffness of left shoulder, not elsewhere classified  Acute pain of left shoulder     Problem List Patient Active Problem List   Diagnosis Date Noted  . Constipation 02/10/2017  . Status post total shoulder arthroplasty, left 02/06/2017  . Elevated transaminase level 01/25/2013  . GERD (gastroesophageal reflux disease) 01/25/2013  . Blood in stool 06/15/2012  . Hypertension 06/15/2012  . High cholesterol 06/15/2012    Leland Staszewski,CHRIS , PTA 04/27/2017, 11:46 AM  [facility] [place], [place], [address] Phone: [phone]   Fax:  [phone]  Name: DIKSHA TAGLIAFERRO MRN: [id number] Date of Birth: July 24, 1946

## 2017-04-30 ENCOUNTER — Ambulatory Visit: Payer: Medicare Other | Admitting: *Deleted

## 2017-04-30 DIAGNOSIS — M25612 Stiffness of left shoulder, not elsewhere classified: Secondary | ICD-10-CM

## 2017-04-30 DIAGNOSIS — M25512 Pain in left shoulder: Secondary | ICD-10-CM | POA: Diagnosis not present

## 2017-04-30 NOTE — Therapy (Signed)
[facility] [place], [place], [address] Phone: [phone]   Fax:  [phone]  Physical Therapy Treatment  Patient Details  Name: [name] MRN: [id number] Date of Birth: Apr 09, 1946 Referring Provider: Esmond Plants MD.  Encounter Date: 04/30/2017      PT End of Session - 04/30/17 0917    Visit Number 9   Number of Visits 12   Date for PT Re-Evaluation 05/29/17   PT Start Time 0900   PT Stop Time 0959   PT Time Calculation (min) 59 min      Past Medical History:  Diagnosis Date  . Anxiety   . Arthritis   . Depression   . GERD (gastroesophageal reflux disease)   . High cholesterol   . Hypertension   . Hypothyroid     Past Surgical History:  Procedure Laterality Date  . APPENDECTOMY    . BUNIONECTOMY    . COLONOSCOPY  06/18/2012   Procedure: COLONOSCOPY;  Surgeon: [name], MD;  [facility];  Service: Endoscopy;  Laterality: N/A;  730  . ESOPHAGOGASTRODUODENOSCOPY    . hammer toe repair    . HEMORRHOID SURGERY    . THYROIDECTOMY    . TONSILLECTOMY    . TOTAL ABDOMINAL HYSTERECTOMY    . TOTAL SHOULDER ARTHROPLASTY Left 02/06/2017  . TOTAL SHOULDER ARTHROPLASTY Left 02/06/2017   Procedure: TOTAL SHOULDER ARTHROPLASTY;  Surgeon: [name], MD;  Location: [place];  Service: Orthopedics;  Laterality: Left;    There were no vitals filed for this visit.      Subjective Assessment - 04/30/17 0917    Subjective Doing better with exs for LT shldr   Patient Stated Goals Use left UE without pain.   Currently in Pain? Yes   Pain Score 2    Pain Location Shoulder   Pain Orientation Left   Pain Type Surgical pain   Pain Frequency Intermittent                         [facility] Adult PT Treatment/Exercise - 04/30/17 0001      Shoulder Exercises: Standing   External Rotation Strengthening;Left;20 reps;10 reps   Theraband Level (Shoulder External Rotation) Level 1 (Yellow)    Internal Rotation Strengthening;20 reps;Left;10 reps;Theraband   Theraband Level (Shoulder Internal Rotation) Level 1 (Yellow)   Flexion Strengthening;Left;20 reps;10 reps;Theraband   Theraband Level (Shoulder Flexion) Level 1 (Yellow)   Row Strengthening;Left;20 reps;Theraband;10 reps   Theraband Level (Shoulder Row) Level 1 (Yellow)     Shoulder Exercises: Pulleys   Flexion Limitations 6 minutes.   Other Pulley Exercises standing UE ranger x 5 mins flexion and circles     Modalities   Modalities Electrical Stimulation;Vasopneumatic     Electrical Stimulation   Electrical Stimulation Location Left shoulder. Premod x 15 mins 80-150hz     Electrical Stimulation Goals Pain     Vasopneumatic   Number Minutes Vasopneumatic  15 minutes   Vasopnuematic Location  Shoulder   Vasopneumatic Pressure Medium   Vasopneumatic Temperature  36     Manual Therapy   Manual Therapy Passive ROM   Passive ROM In supine:  PAROM / AAROM into left shoulder flexion, ER/IR  and rhy stabs.. Contract/ relax technique also used to increase ROM. AAROM circles in supine                   PT Short Term Goals - 03/30/17 1649  PT SHORT TERM GOAL #1   Title STG's=LTG's.           PT Long Term Goals - 04/13/17 0940      PT LONG TERM GOAL #1   Title Independent with a HEP.   Time 6   Period Weeks   Status On-going     PT LONG TERM GOAL #2   Title Active right shoulder flexion to 145 degrees so the patient can easily reach overhead.   Time 6   Period Weeks   Status On-going  AROM L shoulder 95 deg 04/13/2017     PT LONG TERM GOAL #3   Title Active ER to 70 degrees+ to allow for easily donning/doffing of apparel   Time 6   Period Weeks   Status On-going  AROM L shoulder 56 deg 04/13/2017     PT LONG TERM GOAL #4   Title Increase ROM so patient is able to reach behind back to L3.   Time 6   Period Weeks   Status On-going     PT LONG TERM GOAL #5   Title ase shoulder  strength to a solid 4 to 4+/5 to increase stability for performance of functional activities   Time 6   Period Weeks   Status On-going     PT LONG TERM GOAL #6   Title Perform ADL's with pain not > 3/10.   Time 6   Period Weeks   Status On-going               Plan - 04/30/17 9983    Clinical Impression Statement Pt arrived to clinic today doing fairly well with low LT shldr pain. She was able to complete all therex with mainly fatigue. She is still challenged by rhythmic stab act's for elevation above 90 degrees and ER/IR in scaption. ROM is still 140 degrees flexio with some pain at end-range   Rehab Potential Good   PT Frequency 2x / week   PT Duration 6 weeks   PT Treatment/Interventions ADLs/Self Care Home Management;Cryotherapy;Electrical Stimulation;Neuromuscular re-education;Therapeutic exercise;Therapeutic activities;Patient/family education;Manual techniques;Vasopneumatic Device;Passive range of motion   PT Next Visit Plan per MD referral:  AAROM to left shoulder; pulleys.  See N.O.  in media  start Tband, work on rhythmic stability   Consulted and Agree with Plan of Care Patient      Patient will benefit from skilled therapeutic intervention in order to improve the following deficits and impairments:  Decreased activity tolerance, Decreased range of motion, Decreased strength, Pain  Visit Diagnosis: Stiffness of left shoulder, not elsewhere classified  Acute pain of left shoulder     Problem List Patient Active Problem List   Diagnosis Date Noted  . Constipation 02/10/2017  . Status post total shoulder arthroplasty, left 02/06/2017  . Elevated transaminase level 01/25/2013  . GERD (gastroesophageal reflux disease) 01/25/2013  . Blood in stool 06/15/2012  . Hypertension 06/15/2012  . High cholesterol 06/15/2012    [name],CHRIS , PTA 04/30/2017, 10:26 AM  [facility] [place], [place],  [address] Phone: [phone]   Fax:  [phone]  Name: [name] MRN: [id number] Date of Birth: 1946/10/29

## 2017-05-04 ENCOUNTER — Ambulatory Visit: Payer: Medicare Other | Admitting: Physical Therapy

## 2017-05-04 DIAGNOSIS — M25612 Stiffness of left shoulder, not elsewhere classified: Secondary | ICD-10-CM

## 2017-05-04 DIAGNOSIS — M25512 Pain in left shoulder: Secondary | ICD-10-CM

## 2017-05-04 NOTE — Therapy (Signed)
[facility] [place], [place], [address] Phone: [phone]   Fax:  [phone]  Physical Therapy Treatment  Patient Details  Name: [name] MRN: [id number] Date of Birth: 1946/07/24 Referring Provider: Esmond Plants MD.  Encounter Date: 05/04/2017      PT End of Session - 05/04/17 0903    Visit Number 10   Number of Visits 12   Date for PT Re-Evaluation 05/29/17   PT Start Time 0902   PT Stop Time 0957   PT Time Calculation (min) 55 min   Activity Tolerance Patient tolerated treatment well   Behavior During Therapy [facility] for tasks assessed/performed      Past Medical History:  Diagnosis Date  . Anxiety   . Arthritis   . Depression   . GERD (gastroesophageal reflux disease)   . High cholesterol   . Hypertension   . Hypothyroid     Past Surgical History:  Procedure Laterality Date  . APPENDECTOMY    . BUNIONECTOMY    . COLONOSCOPY  06/18/2012   Procedure: COLONOSCOPY;  Surgeon: [name], MD;  [facility];  Service: Endoscopy;  Laterality: N/A;  730  . ESOPHAGOGASTRODUODENOSCOPY    . hammer toe repair    . HEMORRHOID SURGERY    . THYROIDECTOMY    . TONSILLECTOMY    . TOTAL ABDOMINAL HYSTERECTOMY    . TOTAL SHOULDER ARTHROPLASTY Left 02/06/2017  . TOTAL SHOULDER ARTHROPLASTY Left 02/06/2017   Procedure: TOTAL SHOULDER ARTHROPLASTY;  Surgeon: [name], MD;  Location: [place];  Service: Orthopedics;  Laterality: Left;    There were no vitals filed for this visit.      Subjective Assessment - 05/04/17 0904    Subjective I think it's doing a little better with movement.   Patient Stated Goals Use left UE without pain.   Currently in Pain? No/denies                         [facility] Adult PT Treatment/Exercise - 05/04/17 0001      Shoulder Exercises: Standing   External Rotation Strengthening;Left;10 reps;20 reps;Theraband   Theraband Level (Shoulder External  Rotation) Level 1 (Yellow)   Internal Rotation Right;Left;10 reps;20 reps;Theraband   Theraband Level (Shoulder Internal Rotation) Level 1 (Yellow)   Flexion Strengthening;Left;10 reps;20 reps;Theraband   Theraband Level (Shoulder Flexion) Level 1 (Yellow)   Row Strengthening;Left;20 reps;10 reps;Theraband   Theraband Level (Shoulder Row) Level 2 (Red)   Other Standing Exercises wall clocks with towel to fatigue (12 to 9 o'clock)     Shoulder Exercises: Pulleys   Flexion Limitations 6 minutes.   Other Pulley Exercises standing UE ranger x 5 mins flexion and circles     Modalities   Modalities Electrical Stimulation;Vasopneumatic     Electrical Stimulation   Electrical Stimulation Location Left shoulder. Premod x 15 mins 80-150hz     Electrical Stimulation Goals Pain     Vasopneumatic   Number Minutes Vasopneumatic  15 minutes   Vasopnuematic Location  Shoulder   Vasopneumatic Pressure Medium   Vasopneumatic Temperature  34     Manual Therapy   Manual Therapy Soft tissue mobilization;Passive ROM   Soft tissue mobilization to R pecs with ball   Passive ROM L shoulder flex/IR/ER with TPR to R subscapularis                PT Education - 05/04/17 1536    Education provided Yes  Education Details MFR with ball   Person(s) Educated Patient   Methods Explanation;Demonstration   Comprehension Verbalized understanding          PT Short Term Goals - 03/30/17 1649      PT SHORT TERM GOAL #1   Title STG's=LTG's.           PT Long Term Goals - 04/13/17 0940      PT LONG TERM GOAL #1   Title Independent with a HEP.   Time 6   Period Weeks   Status On-going     PT LONG TERM GOAL #2   Title Active right shoulder flexion to 145 degrees so the patient can easily reach overhead.   Time 6   Period Weeks   Status On-going  AROM L shoulder 95 deg 04/13/2017     PT LONG TERM GOAL #3   Title Active ER to 70 degrees+ to allow for easily donning/doffing of apparel    Time 6   Period Weeks   Status On-going  AROM L shoulder 56 deg 04/13/2017     PT LONG TERM GOAL #4   Title Increase ROM so patient is able to reach behind back to L3.   Time 6   Period Weeks   Status On-going     PT LONG TERM GOAL #5   Title ase shoulder strength to a solid 4 to 4+/5 to increase stability for performance of functional activities   Time 6   Period Weeks   Status On-going     PT LONG TERM GOAL #6   Title Perform ADL's with pain not > 3/10.   Time 6   Period Weeks   Status On-going               Plan - May 29, 2017 1536    Clinical Impression Statement Patient did fairly well with PT today. She needs to be verbally cued to push shoulder to ROM limits with therex. Patient had some increased pain at end of treatment and responded well to modalities.   PT Treatment/Interventions ADLs/Self Care Home Management;Cryotherapy;Electrical Stimulation;Neuromuscular re-education;Therapeutic exercise;Therapeutic activities;Patient/family education;Manual techniques;Vasopneumatic Device;Passive range of motion   PT Next Visit Plan per MD referral:  AAROM to left shoulder; pulleys.  See N.O.  in media  start Tband, work on rhythmic stability      Patient will benefit from skilled therapeutic intervention in order to improve the following deficits and impairments:  Decreased activity tolerance, Decreased range of motion, Decreased strength, Pain  Visit Diagnosis: Stiffness of left shoulder, not elsewhere classified  Acute pain of left shoulder       G-Codes - 05-29-17 0908    Functional Assessment Tool Used (Outpatient Only) Clinical judgement.   Functional Limitation Self care   Self Care Current Status [phone]) At least 20 percent but less than 40 percent impaired, limited or restricted   Self Care Goal Status (C1660) At least 20 percent but less than 40 percent impaired, limited or restricted      Problem List Patient Active Problem List   Diagnosis Date Noted   . Constipation 02/10/2017  . Status post total shoulder arthroplasty, left 02/06/2017  . Elevated transaminase level 01/25/2013  . GERD (gastroesophageal reflux disease) 01/25/2013  . Blood in stool 06/15/2012  . Hypertension 06/15/2012  . High cholesterol 06/15/2012    Madelyn Flavors PT 05/29/17, 3:40 PM  [facility] [address] [place], [place], [address] Phone: [phone]   Fax:  [phone]  Name: [name] MRN: [id number] Date of Birth: 03-Jul-1946

## 2017-05-07 ENCOUNTER — Ambulatory Visit: Payer: Medicare Other | Admitting: *Deleted

## 2017-05-07 DIAGNOSIS — M25612 Stiffness of left shoulder, not elsewhere classified: Secondary | ICD-10-CM | POA: Diagnosis not present

## 2017-05-07 DIAGNOSIS — M25512 Pain in left shoulder: Secondary | ICD-10-CM

## 2017-05-07 NOTE — Therapy (Signed)
[facility] [place], [place], [address] Phone: [phone]   Fax:  [phone]  Physical Therapy Treatment  Patient Details  Name: [name] MRN: [id number] Date of Birth: [birth year] Referring Provider: Esmond Plants MD.  Encounter Date: 05/07/2017      PT End of Session - 05/07/17 0954    Visit Number 11   Number of Visits 12   Date for PT Re-Evaluation 05/29/17   PT Start Time 0900   PT Stop Time 1000   PT Time Calculation (min) 60 min      Past Medical History:  Diagnosis Date  . Anxiety   . Arthritis   . Depression   . GERD (gastroesophageal reflux disease)   . High cholesterol   . Hypertension   . Hypothyroid     Past Surgical History:  Procedure Laterality Date  . APPENDECTOMY    . BUNIONECTOMY    . COLONOSCOPY  06/18/2012   Procedure: COLONOSCOPY;  Surgeon: [name], MD;  [facility];  Service: Endoscopy;  Laterality: N/A;  730  . ESOPHAGOGASTRODUODENOSCOPY    . hammer toe repair    . HEMORRHOID SURGERY    . THYROIDECTOMY    . TONSILLECTOMY    . TOTAL ABDOMINAL HYSTERECTOMY    . TOTAL SHOULDER ARTHROPLASTY Left 02/06/2017  . TOTAL SHOULDER ARTHROPLASTY Left 02/06/2017   Procedure: TOTAL SHOULDER ARTHROPLASTY;  Surgeon: [name], MD;  Location: [place];  Service: Orthopedics;  Laterality: Left;    There were no vitals filed for this visit.      Subjective Assessment - 05/07/17 0916    Subjective I think it's doing a little better with movement.                         [facility] Adult PT Treatment/Exercise - 05/07/17 0001      Shoulder Exercises: Standing   External Rotation Strengthening;Left;10 reps;20 reps;Theraband   Theraband Level (Shoulder External Rotation) Level 1 (Yellow)   Internal Rotation Right;Left;10 reps;20 reps;Theraband   Theraband Level (Shoulder Internal Rotation) Level 2 (Red)   Flexion Strengthening;Left;10 reps;20  reps;Theraband   Theraband Level (Shoulder Flexion) Level 2 (Red)   Row Strengthening;Left;20 reps;10 reps;Theraband   Theraband Level (Shoulder Row) Level 2 (Red)     Shoulder Exercises: Pulleys   Flexion Limitations 5 mins   Other Pulley Exercises standing UE ranger x 5 mins flexion and circles     Modalities   Modalities Electrical Stimulation;Vasopneumatic     Electrical Stimulation   Electrical Stimulation Location Left shoulder. Premod x 15 mins 80-150hz     Electrical Stimulation Goals Pain     Vasopneumatic   Number Minutes Vasopneumatic  15 minutes   Vasopnuematic Location  Shoulder   Vasopneumatic Pressure Medium   Vasopneumatic Temperature  34     Manual Therapy   Manual Therapy Soft tissue mobilization;Passive ROM   Passive ROM In supine:  PAROM / AAROM into left shoulder flexion, ER/IR  and rhy stabs.. Contract/ relax technique also used to increase ROM. AAROM circles in supine                   PT Short Term Goals - 03/30/17 1649      PT SHORT TERM GOAL #1   Title STG's=LTG's.           PT Long Term Goals - 04/13/17 0940      PT LONG TERM GOAL #1  Title Independent with a HEP.   Time 6   Period Weeks   Status On-going     PT LONG TERM GOAL #2   Title Active right shoulder flexion to 145 degrees so the patient can easily reach overhead.   Time 6   Period Weeks   Status On-going  AROM L shoulder 95 deg 04/13/2017     PT LONG TERM GOAL #3   Title Active ER to 70 degrees+ to allow for easily donning/doffing of apparel   Time 6   Period Weeks   Status On-going  AROM L shoulder 56 deg 04/13/2017     PT LONG TERM GOAL #4   Title Increase ROM so patient is able to reach behind back to L3.   Time 6   Period Weeks   Status On-going     PT LONG TERM GOAL #5   Title ase shoulder strength to a solid 4 to 4+/5 to increase stability for performance of functional activities   Time 6   Period Weeks   Status On-going     PT LONG TERM GOAL #6    Title Perform ADL's with pain not > 3/10.   Time 6   Period Weeks   Status On-going               Plan - 05/07/17 1542    Clinical Impression Statement Pt arrived to clinic doing fairly well today and feels that she is doing better and using LT UE more. She was able to complete all therex today for LT shldr with mainly fatigue.  She is still challenged with rhythmic stabilization for elevation above 100 degrees and needs V/C's. PROM to 145 degrees elevationa and ER  to 62 degrees   Rehab Potential Good   PT Frequency 2x / week   PT Duration 6 weeks   PT Treatment/Interventions ADLs/Self Care Home Management;Cryotherapy;Electrical Stimulation;Neuromuscular re-education;Therapeutic exercise;Therapeutic activities;Patient/family education;Manual techniques;Vasopneumatic Device;Passive range of motion   PT Next Visit Plan per MD referral:  AAROM to left shoulder; pulleys.  See N.O.  in media  start Tband, work on rhythmic stability   Consulted and Agree with Plan of Care Patient      Patient will benefit from skilled therapeutic intervention in order to improve the following deficits and impairments:  Decreased activity tolerance, Decreased range of motion, Decreased strength, Pain  Visit Diagnosis: Stiffness of left shoulder, not elsewhere classified  Acute pain of left shoulder     Problem List Patient Active Problem List   Diagnosis Date Noted  . Constipation 02/10/2017  . Status post total shoulder arthroplasty, left 02/06/2017  . Elevated transaminase level 01/25/2013  . GERD (gastroesophageal reflux disease) 01/25/2013  . Blood in stool 06/15/2012  . Hypertension 06/15/2012  . High cholesterol 06/15/2012    Jackie Russman,CHRIS, PTA 05/07/2017, 3:49 PM  [facility] [place], [place], [address] Phone: [phone]   Fax:  [phone]  Name: [name] MRN: [id number] Date of Birth: 06/22/1946

## 2017-05-11 ENCOUNTER — Encounter: Payer: Self-pay | Admitting: Physical Therapy

## 2017-05-11 ENCOUNTER — Ambulatory Visit: Payer: Medicare Other | Admitting: Physical Therapy

## 2017-05-11 DIAGNOSIS — M25512 Pain in left shoulder: Secondary | ICD-10-CM

## 2017-05-11 DIAGNOSIS — M25612 Stiffness of left shoulder, not elsewhere classified: Secondary | ICD-10-CM

## 2017-05-11 NOTE — Therapy (Signed)
[facility] [place], [place], [address] Phone: [phone]   Fax:  [phone]  Physical Therapy Treatment  Patient Details  Name: [name] MRN: [id number] Date of Birth: 1946/01/04 Referring Provider: Esmond Plants MD.  Encounter Date: 05/11/2017      PT End of Session - 05/11/17 1036    Visit Number 12   Number of Visits 12   Date for PT Re-Evaluation 05/29/17   PT Start Time 1031   PT Stop Time 1118   PT Time Calculation (min) 47 min   Activity Tolerance Patient tolerated treatment well   Behavior During Therapy [facility] for tasks assessed/performed      Past Medical History:  Diagnosis Date  . Anxiety   . Arthritis   . Depression   . GERD (gastroesophageal reflux disease)   . High cholesterol   . Hypertension   . Hypothyroid     Past Surgical History:  Procedure Laterality Date  . APPENDECTOMY    . BUNIONECTOMY    . COLONOSCOPY  06/18/2012   Procedure: COLONOSCOPY;  Surgeon: [name], MD;  [facility];  Service: Endoscopy;  Laterality: N/A;  730  . ESOPHAGOGASTRODUODENOSCOPY    . hammer toe repair    . HEMORRHOID SURGERY    . THYROIDECTOMY    . TONSILLECTOMY    . TOTAL ABDOMINAL HYSTERECTOMY    . TOTAL SHOULDER ARTHROPLASTY Left 02/06/2017  . TOTAL SHOULDER ARTHROPLASTY Left 02/06/2017   Procedure: TOTAL SHOULDER ARTHROPLASTY;  Surgeon: [name], MD;  Location: [facility];  Service: Orthopedics;  Laterality: Left;    There were no vitals filed for this visit.      Subjective Assessment - 05/11/17 1035    Subjective Reports that her shoulder is doing better.   Patient Stated Goals Use left UE without pain.   Currently in Pain? No/denies            [facility] PT Assessment - 05/11/17 0001      Assessment   Medical Diagnosis Left total shoulder replacement.   Onset Date/Surgical Date 02/06/17   Next MD Visit 05/13/2017     Restrictions   Weight Bearing Restrictions No     ROM / Strength   AROM / PROM / Strength AROM     AROM   Overall AROM  Deficits   AROM Assessment Site Shoulder   Right/Left Shoulder Left   Left Shoulder Flexion 125 Degrees  90 deg in standing with RUE assist   Left Shoulder Internal Rotation 73 Degrees   Left Shoulder External Rotation 70 Degrees                     [facility] Adult PT Treatment/Exercise - 05/11/17 0001      Shoulder Exercises: Seated   Flexion AROM;Both;15 reps     Shoulder Exercises: Standing   External Rotation Strengthening;Left;20 reps;Theraband   Theraband Level (Shoulder External Rotation) Level 1 (Yellow)   Internal Rotation Strengthening;Left;20 reps;Theraband   Theraband Level (Shoulder Internal Rotation) Level 1 (Yellow)   Extension Strengthening;Left;20 reps;Theraband   Theraband Level (Shoulder Extension) Level 1 (Yellow)   Row Strengthening;Left;20 reps;Theraband   Theraband Level (Shoulder Row) Level 1 (Yellow)     Shoulder Exercises: Pulleys   Flexion Other (comment)  x5 min   Other Pulley Exercises Wall climbs x20 reps   Other Pulley Exercises standing UE ranger x20 reps flexion and circles     Shoulder Exercises: Stretch   Internal Rotation  Stretch 3 reps  30 sec hold towel stretch     Modalities   Modalities Passenger transport manager Location L shoulder   Electrical Stimulation Action IFC   Electrical Stimulation Parameters 1-10 hz x15 min   Electrical Stimulation Goals Pain     Vasopneumatic   Number Minutes Vasopneumatic  15 minutes   Vasopnuematic Location  Shoulder   Vasopneumatic Pressure Low   Vasopneumatic Temperature  34     Manual Therapy   Manual Therapy Passive ROM   Passive ROM PROM of L shoulder into flexion, ER, IR with holds at end range                  PT Short Term Goals - 03/30/17 1649      PT SHORT TERM GOAL #1   Title STG's=LTG's.           PT Long Term  Goals - 05/11/17 1126      PT LONG TERM GOAL #1   Title Independent with a HEP.   Time 6   Period Weeks   Status On-going     PT LONG TERM GOAL #2   Title Active right shoulder flexion to 145 degrees so the patient can easily reach overhead.   Time 6   Period Weeks   Status On-going  AROM L shoulder 125 deg 05/11/2017     PT LONG TERM GOAL #3   Title Active ER to 70 degrees+ to allow for easily donning/doffing of apparel   Time 6   Period Weeks   Status Achieved  AROM L shoulder 70 deg 05/11/2017     PT LONG TERM GOAL #4   Title Increase ROM so patient is able to reach behind back to L3.   Time 6   Period Weeks   Status On-going     PT LONG TERM GOAL #5   Title ase shoulder strength to a solid 4 to 4+/5 to increase stability for performance of functional activities   Time 6   Period Weeks   Status On-going     PT LONG TERM GOAL #6   Title Perform ADL's with pain not > 3/10.   Time 6   Period Weeks   Status On-going               Plan - 05/11/17 1106    Clinical Impression Statement Patient tolerated today's treatment fairly well as she remains limited with antigravity ROM and strengthening exercises. Patient required tactile and VCs for exercise technique with resisted strengthening exercises. Patient fatigues quickly with antigravity exercises and also requires assist from opposite UE. Patient experienced discomfort/stretch with towel stretch but also required assist from PTA to further stretch. AROM L shoulder measurements have improved although antigravity flexion measured at only 90 deg. Normal modalities response noted following removal of the modalities.     Rehab Potential Good   PT Frequency 2x / week   PT Duration 6 weeks   PT Treatment/Interventions ADLs/Self Care Home Management;Cryotherapy;Electrical Stimulation;Neuromuscular re-education;Therapeutic exercise;Therapeutic activities;Patient/family education;Manual techniques;Vasopneumatic Device;Passive  range of motion   PT Next Visit Plan per MD referral:  AAROM to left shoulder; pulleys.  See N.O.  in media  start Tband, work on rhythmic stability   Consulted and Agree with Plan of Care Patient      Patient will benefit from skilled therapeutic intervention in order to improve the following deficits and impairments:  Decreased activity tolerance, Decreased range  of motion, Decreased strength, Pain  Visit Diagnosis: Stiffness of left shoulder, not elsewhere classified  Acute pain of left shoulder     Problem List Patient Active Problem List   Diagnosis Date Noted  . Constipation 02/10/2017  . Status post total shoulder arthroplasty, left 02/06/2017  . Elevated transaminase level 01/25/2013  . GERD (gastroesophageal reflux disease) 01/25/2013  . Blood in stool 06/15/2012  . Hypertension 06/15/2012  . High cholesterol 06/15/2012    Ahmed Prima, PTA 05/11/17 11:29 AM Mali Applegate MPT [facility] [place], [place], [address] Phone: [phone]   Fax:  [phone]  Name: [name] MRN: [id number] Date of Birth: January 18, 1946

## 2017-05-13 DIAGNOSIS — Z471 Aftercare following joint replacement surgery: Secondary | ICD-10-CM | POA: Diagnosis not present

## 2017-05-13 DIAGNOSIS — Z96612 Presence of left artificial shoulder joint: Secondary | ICD-10-CM | POA: Diagnosis not present

## 2017-05-14 ENCOUNTER — Ambulatory Visit: Payer: Medicare Other | Admitting: Physical Therapy

## 2017-05-14 ENCOUNTER — Encounter: Payer: Self-pay | Admitting: Physical Therapy

## 2017-05-14 DIAGNOSIS — M25612 Stiffness of left shoulder, not elsewhere classified: Secondary | ICD-10-CM

## 2017-05-14 DIAGNOSIS — M25512 Pain in left shoulder: Secondary | ICD-10-CM | POA: Diagnosis not present

## 2017-05-14 NOTE — Therapy (Signed)
[facility] [place], [place], [address] Phone: [phone]   Fax:  [phone]  Physical Therapy Treatment  Patient Details  Name: [name] MRN: [id number] Date of Birth: June 03, 1946 Referring Provider: Esmond Plants MD.  Encounter Date: 05/14/2017      PT End of Session - 05/14/17 0956    Visit Number 13   Number of Visits 24   Date for PT Re-Evaluation 06/24/17   Authorization Type KX modifier at visit 15   PT Start Time 0945   PT Stop Time 1030   PT Time Calculation (min) 45 min   Activity Tolerance Patient tolerated treatment well   Behavior During Therapy [facility] for tasks assessed/performed      Past Medical History:  Diagnosis Date  . Anxiety   . Arthritis   . Depression   . GERD (gastroesophageal reflux disease)   . High cholesterol   . Hypertension   . Hypothyroid     Past Surgical History:  Procedure Laterality Date  . APPENDECTOMY    . BUNIONECTOMY    . COLONOSCOPY  06/18/2012   Procedure: COLONOSCOPY;  Surgeon: [name], MD;  [facility];  Service: Endoscopy;  Laterality: N/A;  730  . ESOPHAGOGASTRODUODENOSCOPY    . hammer toe repair    . HEMORRHOID SURGERY    . THYROIDECTOMY    . TONSILLECTOMY    . TOTAL ABDOMINAL HYSTERECTOMY    . TOTAL SHOULDER ARTHROPLASTY Left 02/06/2017  . TOTAL SHOULDER ARTHROPLASTY Left 02/06/2017   Procedure: TOTAL SHOULDER ARTHROPLASTY;  Surgeon: [name], MD;  Location: [place];  Service: Orthopedics;  Laterality: Left;    There were no vitals filed for this visit.      Subjective Assessment - 05/14/17 0950    Subjective Pt arriving to therapy today with new MD order for 6 additional weeks of therapy for left shoulder. Pt reporting no pain but stiffness.    Patient Stated Goals Use left UE without pain.   Currently in Pain? No/denies            [facility] PT Assessment - 05/14/17 0001      Assessment   Medical Diagnosis left total  shoulder   Onset Date/Surgical Date 02/06/17   Next MD Visit 05/13/2017     Restrictions   Weight Bearing Restrictions No     AROM   Overall AROM  Deficits   AROM Assessment Site Shoulder   Right/Left Shoulder Left                     [facility] Adult PT Treatment/Exercise - 05/14/17 0001      Shoulder Exercises: Seated   Flexion AROM;Both;15 reps     Shoulder Exercises: Standing   External Rotation Strengthening;Left;20 reps;Theraband   Theraband Level (Shoulder External Rotation) Level 1 (Yellow)   Internal Rotation Strengthening;Left;20 reps;Theraband   Theraband Level (Shoulder Internal Rotation) Level 1 (Yellow)   Extension Strengthening;Left;20 reps;Theraband   Theraband Level (Shoulder Extension) Level 1 (Yellow)   Row Strengthening;Left;20 reps;Theraband   Theraband Level (Shoulder Row) Level 1 (Yellow)     Shoulder Exercises: Pulleys   Flexion Limitations 6 minutes   Other Pulley Exercises --   Other Pulley Exercises standing UE ranger x20 reps flexion and circles     Shoulder Exercises: Stretch   Internal Rotation Stretch 3 reps  30 sec hold towel stretch     Modalities   Modalities Cryotherapy;Electrical Stimulation     Cryotherapy  Number Minutes Cryotherapy 15 Minutes   Cryotherapy Location Shoulder   Type of Cryotherapy Ice pack     Electrical Stimulation   Electrical Stimulation Location left shoulder   Electrical Stimulation Action IFC   Electrical Stimulation Parameters 1-10 Hz x 15 minutes   Electrical Stimulation Goals Pain     Vasopneumatic   Number Minutes Vasopneumatic  --  machine not available today     Manual Therapy   Manual Therapy Passive ROM   Passive ROM shoulder flexion and ER                PT Education - 05/14/17 0951    Education Details Discussed limiting certain ADL's (lifting and vacuuming)   Person(s) Educated Patient   Methods Explanation   Comprehension Verbalized understanding          PT  Short Term Goals - 03/30/17 1649      PT SHORT TERM GOAL #1   Title STG's=LTG's.           PT Long Term Goals - 05/14/17 0959      PT LONG TERM GOAL #1   Title Independent with a HEP.   Time 6   Period Weeks   Status On-going     PT LONG TERM GOAL #2   Title Active right shoulder flexion to 145 degrees so the patient can easily reach overhead.   Baseline 05/14/17 125 degrees    Time 6   Status On-going     PT LONG TERM GOAL #3   Title Active ER to 70 degrees+ to allow for easily donning/doffing of apparel   Time 6   Status Achieved     PT LONG TERM GOAL #4   Title Increase ROM so patient is able to reach behind back to L3.   Status On-going     PT LONG TERM GOAL #5   Title ase shoulder strength to a solid 4 to 4+/5 to increase stability for performance of functional activities   Period Weeks   Status On-going     PT LONG TERM GOAL #6   Title Perform ADL's with pain not > 3/10.   Baseline limited reaching overhead and reaching up into upper shelves in her cabinets, unable to lift a gallon of milk with her left UE.    Period Weeks   Status On-going               Plan - 05/14/17 1242    Clinical Impression Statement Patient tolerating therapy well today. Pt reporting limitation with ADL's and household chores. Pt arriving today with new order from her MD for 6 additional weeks of therapy to progress ROM and strength. Pt's left shoulder flexion was 125 degrees and ER was 50 passively. Continue skilled PT to progress pt toward her LTG's.    Rehab Potential Good   PT Frequency 2x / week   PT Duration 6 weeks   PT Treatment/Interventions ADLs/Self Care Home Management;Cryotherapy;Electrical Stimulation;Neuromuscular re-education;Therapeutic exercise;Therapeutic activities;Patient/family education;Manual techniques;Vasopneumatic Device;Passive range of motion   PT Next Visit Plan per MD referral:  AAROM to left shoulder; pulleys.  See N.O.  in media  start Tband,  work on rhythmic stability   PT Home Exercise Plan Shoulder flexion, ER AAROM   Consulted and Agree with Plan of Care Patient      Patient will benefit from skilled therapeutic intervention in order to improve the following deficits and impairments:  Decreased activity tolerance, Decreased range of motion, Decreased strength, Pain  Visit Diagnosis: Stiffness of left shoulder, not elsewhere classified  Acute pain of left shoulder     Problem List Patient Active Problem List   Diagnosis Date Noted  . Constipation 02/10/2017  . Status post total shoulder arthroplasty, left 02/06/2017  . Elevated transaminase level 01/25/2013  . GERD (gastroesophageal reflux disease) 01/25/2013  . Blood in stool 06/15/2012  . Hypertension 06/15/2012  . High cholesterol 06/15/2012    Oretha Caprice, MPT 05/14/2017, 12:54 PM  [facility] [place], [place], [address] Phone: [phone]   Fax:  [phone]  Name: [name] MRN: [id number] Date of Birth: 1946/01/02

## 2017-05-18 ENCOUNTER — Ambulatory Visit: Payer: Medicare Other | Admitting: *Deleted

## 2017-05-18 DIAGNOSIS — M25512 Pain in left shoulder: Secondary | ICD-10-CM

## 2017-05-18 DIAGNOSIS — M25612 Stiffness of left shoulder, not elsewhere classified: Secondary | ICD-10-CM

## 2017-05-18 NOTE — Therapy (Signed)
[facility] [place], [place], [address] Phone: [phone]   Fax:  [phone]  Physical Therapy Treatment  Patient Details  Name: [name] MRN: [id number] Date of Birth: 1946/05/03 Referring Provider: Esmond Plants MD.  Encounter Date: 05/18/2017      PT End of Session - 05/18/17 1629    Visit Number 14   Number of Visits 24   Date for PT Re-Evaluation 06/24/17   Authorization Type KX modifier at visit 15   PT Start Time 1430   PT Stop Time 1530   PT Time Calculation (min) 60 min      Past Medical History:  Diagnosis Date  . Anxiety   . Arthritis   . Depression   . GERD (gastroesophageal reflux disease)   . High cholesterol   . Hypertension   . Hypothyroid     Past Surgical History:  Procedure Laterality Date  . APPENDECTOMY    . BUNIONECTOMY    . COLONOSCOPY  06/18/2012   Procedure: COLONOSCOPY;  Surgeon: [name], MD;  [facility];  Service: Endoscopy;  Laterality: N/A;  730  . ESOPHAGOGASTRODUODENOSCOPY    . hammer toe repair    . HEMORRHOID SURGERY    . THYROIDECTOMY    . TONSILLECTOMY    . TOTAL ABDOMINAL HYSTERECTOMY    . TOTAL SHOULDER ARTHROPLASTY Left 02/06/2017  . TOTAL SHOULDER ARTHROPLASTY Left 02/06/2017   Procedure: TOTAL SHOULDER ARTHROPLASTY;  Surgeon: [name], MD;  Location: [place];  Service: Orthopedics;  Laterality: Left;    There were no vitals filed for this visit.      Subjective Assessment - 05/18/17 1444    Subjective Pt arriving to therapy today with new MD order for 6 additional weeks of therapy for left shoulder. Pt reporting no pain but stiffness.    Patient Stated Goals Use left UE without pain.   Currently in Pain? No/denies                         [facility] Adult PT Treatment/Exercise - 05/18/17 0001      Shoulder Exercises: Standing   External Rotation Strengthening;Left;20 reps;Theraband   Theraband Level (Shoulder  External Rotation) Level 1 (Yellow)   Internal Rotation Strengthening;Left;20 reps;Theraband   Theraband Level (Shoulder Internal Rotation) Level 1 (Yellow)   Extension Strengthening;Left  XTS pink 4x 10   Row --  XTS PINK 4x10     Shoulder Exercises: Pulleys   Flexion Other (comment)  x5 min   Other Pulley Exercises standing UE ranger x 5 mins flexion and circles     Electrical Stimulation   Electrical Stimulation Location Left shoulder. Premod x 15 mins 80-150hz     Electrical Stimulation Goals Pain     Vasopneumatic   Number Minutes Vasopneumatic  15 minutes   Vasopnuematic Location  Shoulder   Vasopneumatic Pressure Low   Vasopneumatic Temperature  34     Manual Therapy   Manual Therapy Passive ROM;Soft tissue mobilization   Soft tissue mobilization STW/ TPR to LT subscap with pt supine   Passive ROM shoulder flexion and ER/IR                  PT Short Term Goals - 03/30/17 1649      PT SHORT TERM GOAL #1   Title STG's=LTG's.           PT Long Term Goals - 05/14/17 0092  PT LONG TERM GOAL #1   Title Independent with a HEP.   Time 6   Period Weeks   Status On-going     PT LONG TERM GOAL #2   Title Active right shoulder flexion to 145 degrees so the patient can easily reach overhead.   Baseline 05/14/17 125 degrees    Time 6   Status On-going     PT LONG TERM GOAL #3   Title Active ER to 70 degrees+ to allow for easily donning/doffing of apparel   Time 6   Status Achieved     PT LONG TERM GOAL #4   Title Increase ROM so patient is able to reach behind back to L3.   Status On-going     PT LONG TERM GOAL #5   Title ase shoulder strength to a solid 4 to 4+/5 to increase stability for performance of functional activities   Period Weeks   Status On-going     PT LONG TERM GOAL #6   Title Perform ADL's with pain not > 3/10.   Baseline limited reaching overhead and reaching up into upper shelves in her cabinets, unable to lift a gallon of  milk with her left UE.    Period Weeks   Status On-going               Plan - 05/18/17 1632    Clinical Impression Statement Pt arrived to clinic today doing fairly well with LT shldr. She did well with therex and was mainly fatigued after act.'s. She was very sore and tight in LT subscapularis and had good releases after STW and TP release.   Rehab Potential Good   PT Frequency 2x / week   PT Duration 6 weeks   PT Treatment/Interventions ADLs/Self Care Home Management;Cryotherapy;Electrical Stimulation;Neuromuscular re-education;Therapeutic exercise;Therapeutic activities;Patient/family education;Manual techniques;Vasopneumatic Device;Passive range of motion   PT Next Visit Plan per MD referral:  AAROM to left shoulder; pulleys.  See N.O.  in media  start Tband, work on rhythmic stability   PT Home Exercise Plan Shoulder flexion, ER AAROM      Patient will benefit from skilled therapeutic intervention in order to improve the following deficits and impairments:  Decreased activity tolerance, Decreased range of motion, Decreased strength, Pain  Visit Diagnosis: Stiffness of left shoulder, not elsewhere classified  Acute pain of left shoulder     Problem List Patient Active Problem List   Diagnosis Date Noted  . Constipation 02/10/2017  . Status post total shoulder arthroplasty, left 02/06/2017  . Elevated transaminase level 01/25/2013  . GERD (gastroesophageal reflux disease) 01/25/2013  . Blood in stool 06/15/2012  . Hypertension 06/15/2012  . High cholesterol 06/15/2012    Vaughn Frieze,CHRIS, PTA 05/18/2017, 4:45 PM  [facility] [place], [place], [address] Phone: [phone]   Fax:  [phone]  Name: [name] MRN: [id number] Date of Birth: 16-Oct-1946

## 2017-05-21 ENCOUNTER — Ambulatory Visit: Payer: Medicare Other | Admitting: Physical Therapy

## 2017-05-21 DIAGNOSIS — M25612 Stiffness of left shoulder, not elsewhere classified: Secondary | ICD-10-CM | POA: Diagnosis not present

## 2017-05-21 DIAGNOSIS — M25512 Pain in left shoulder: Secondary | ICD-10-CM

## 2017-05-21 NOTE — Therapy (Signed)
[facility] [place], [place], [address] Phone: [phone]   Fax:  [phone]  Physical Therapy Treatment  Patient Details  Name: [name] MRN: [id number] Date of Birth: [birth year] Referring Provider: Esmond Plants MD.  Encounter Date: 05/21/2017      PT End of Session - 05/21/17 1003    Visit Number 15   Number of Visits 24   Date for PT Re-Evaluation 06/24/17   PT Start Time 0900   PT Stop Time 0944   PT Time Calculation (min) 44 min   Activity Tolerance Patient tolerated treatment well   Behavior During Therapy [facility] for tasks assessed/performed      Past Medical History:  Diagnosis Date  . Anxiety   . Arthritis   . Depression   . GERD (gastroesophageal reflux disease)   . High cholesterol   . Hypertension   . Hypothyroid     Past Surgical History:  Procedure Laterality Date  . APPENDECTOMY    . BUNIONECTOMY    . COLONOSCOPY  06/18/2012   Procedure: COLONOSCOPY;  Surgeon: [name], MD;  [facility];  Service: Endoscopy;  Laterality: N/A;  730  . ESOPHAGOGASTRODUODENOSCOPY    . hammer toe repair    . HEMORRHOID SURGERY    . THYROIDECTOMY    . TONSILLECTOMY    . TOTAL ABDOMINAL HYSTERECTOMY    . TOTAL SHOULDER ARTHROPLASTY Left 02/06/2017  . TOTAL SHOULDER ARTHROPLASTY Left 02/06/2017   Procedure: TOTAL SHOULDER ARTHROPLASTY;  Surgeon: [name], MD;  Location: [place];  Service: Orthopedics;  Laterality: Left;    There were no vitals filed for this visit.      Subjective Assessment - 05/21/17 0939    Subjective Feels pretty good but stiff.   Patient Stated Goals Use left UE without pain.   Pain Score 2    Pain Location Shoulder   Pain Orientation Right   Pain Descriptors / Indicators Tightness   Pain Type Surgical pain   Pain Frequency Intermittent                         [facility] Adult PT Treatment/Exercise - 05/21/17 0001      Exercises   Exercises Knee/Hip     Shoulder Exercises: Seated   Other Seated Exercises 1-1 antigravity left shoulder elevation, flexion; ER and PNF patterns and rhy stabs (total 8 minutes).     Shoulder Exercises: Pulleys   Flexion Limitations 5 minutes.   Other Pulley Exercises UE Ranger x 5 minutes. and wall ladder x 5 minutes.     Modalities   Modalities Location manager Stimulation Location Left shoulder.   Electrical Stimulation Action Pre-mod.   Electrical Stimulation Parameters Constant at 80-150 Hz x 15 minutes.   Electrical Stimulation Goals Pain     Vasopneumatic   Number Minutes Vasopneumatic  15 minutes   Vasopnuematic Location  --  Left shoulder.   Vasopneumatic Pressure Medium                  PT Short Term Goals - 03/30/17 1649      PT SHORT TERM GOAL #1   Title STG's=LTG's.           PT Long Term Goals - 05/14/17 0959      PT LONG TERM GOAL #1   Title Independent with a HEP.   Time 6   Period  Weeks   Status On-going     PT LONG TERM GOAL #2   Title Active right shoulder flexion to 145 degrees so the patient can easily reach overhead.   Baseline 05/14/17 125 degrees    Time 6   Status On-going     PT LONG TERM GOAL #3   Title Active ER to 70 degrees+ to allow for easily donning/doffing of apparel   Time 6   Status Achieved     PT LONG TERM GOAL #4   Title Increase ROM so patient is able to reach behind back to L3.   Status On-going     PT LONG TERM GOAL #5   Title ase shoulder strength to a solid 4 to 4+/5 to increase stability for performance of functional activities   Period Weeks   Status On-going     PT LONG TERM GOAL #6   Title Perform ADL's with pain not > 3/10.   Baseline limited reaching overhead and reaching up into upper shelves in her cabinets, unable to lift a gallon of milk with her left UE.    Period Weeks   Status On-going               Plan - 05/21/17  1006    Clinical Impression Statement Excellent job today.  Patient still weak especially in the antigravity plane.      Patient will benefit from skilled therapeutic intervention in order to improve the following deficits and impairments:  Decreased activity tolerance, Decreased range of motion, Decreased strength, Pain  Visit Diagnosis: Stiffness of left shoulder, not elsewhere classified  Acute pain of left shoulder     Problem List Patient Active Problem List   Diagnosis Date Noted  . Constipation 02/10/2017  . Status post total shoulder arthroplasty, left 02/06/2017  . Elevated transaminase level 01/25/2013  . GERD (gastroesophageal reflux disease) 01/25/2013  . Blood in stool 06/15/2012  . Hypertension 06/15/2012  . High cholesterol 06/15/2012    Tashunda Vandezande, [place] MPT 05/21/2017, 10:20 AM  [facility] [address] [place], [place], [address] Phone: [phone]   Fax:  [phone]  Name: [name] MRN: [id number] Date of Birth: October 08, 1946

## 2017-05-25 ENCOUNTER — Ambulatory Visit: Payer: Medicare Other | Attending: Orthopedic Surgery | Admitting: *Deleted

## 2017-05-25 DIAGNOSIS — M25512 Pain in left shoulder: Secondary | ICD-10-CM | POA: Insufficient documentation

## 2017-05-25 DIAGNOSIS — M25612 Stiffness of left shoulder, not elsewhere classified: Secondary | ICD-10-CM

## 2017-05-25 NOTE — Therapy (Signed)
[facility] [place], [place], [address] Phone: [phone]   Fax:  [phone]  Physical Therapy Treatment  Patient Details  Name: [name] MRN: [id number] Date of Birth: Nov 10, 1946 Referring Provider: Esmond Plants MD.  Encounter Date: 05/25/2017      PT End of Session - 05/25/17 0929    Visit Number 16   Number of Visits 24   Date for PT Re-Evaluation 06/24/17   Authorization Type KX modifier at visit 15   PT Start Time 0900   PT Stop Time 0958   PT Time Calculation (min) 58 min      Past Medical History:  Diagnosis Date  . Anxiety   . Arthritis   . Depression   . GERD (gastroesophageal reflux disease)   . High cholesterol   . Hypertension   . Hypothyroid     Past Surgical History:  Procedure Laterality Date  . APPENDECTOMY    . BUNIONECTOMY    . COLONOSCOPY  06/18/2012   Procedure: COLONOSCOPY;  Surgeon: [name], MD;  [facility];  Service: Endoscopy;  Laterality: N/A;  730  . ESOPHAGOGASTRODUODENOSCOPY    . hammer toe repair    . HEMORRHOID SURGERY    . THYROIDECTOMY    . TONSILLECTOMY    . TOTAL ABDOMINAL HYSTERECTOMY    . TOTAL SHOULDER ARTHROPLASTY Left 02/06/2017  . TOTAL SHOULDER ARTHROPLASTY Left 02/06/2017   Procedure: TOTAL SHOULDER ARTHROPLASTY;  Surgeon: [name], MD;  Location: [place];  Service: Orthopedics;  Laterality: Left;    There were no vitals filed for this visit.      Subjective Assessment - 05/25/17 0928    Subjective LT Arm is doing better, just heavy and weak   Patient Stated Goals Use left UE without pain.   Currently in Pain? No/denies   Pain Score 1    Pain Location Shoulder   Pain Orientation Left   Pain Descriptors / Indicators Sore                         [facility] Adult PT Treatment/Exercise - 05/25/17 0001      Exercises   Exercises Knee/Hip     Shoulder Exercises: Sidelying   External Rotation AROM;Left;20 reps    Flexion AROM;Left;20 reps     Shoulder Exercises: Standing   Other Standing Exercises wall slides 3x10 with holds at top with hand off the wall     Shoulder Exercises: Pulleys   Flexion Other (comment)  x5 min   Other Pulley Exercises UE Ranger x 5 minutes. and wall ladder x 5 minutes.     Modalities   Modalities Location manager Stimulation Location Left shoulder. Premod x 15 mins 80-150hz     Electrical Stimulation Goals Pain     Vasopneumatic   Number Minutes Vasopneumatic  15 minutes   Vasopnuematic Location  Shoulder   Vasopneumatic Pressure Medium   Vasopneumatic Temperature  34     Manual Therapy   Manual Therapy Passive ROM;Soft tissue mobilization   Passive ROM shoulder flexion AAROM in standing to reach available ROM 3 x10                  PT Short Term Goals - 03/30/17 1649      PT SHORT TERM GOAL #1   Title STG's=LTG's.           PT Long Term  Goals - 05/14/17 0959      PT LONG TERM GOAL #1   Title Independent with a HEP.   Time 6   Period Weeks   Status On-going     PT LONG TERM GOAL #2   Title Active right shoulder flexion to 145 degrees so the patient can easily reach overhead.   Baseline 05/14/17 125 degrees    Time 6   Status On-going     PT LONG TERM GOAL #3   Title Active ER to 70 degrees+ to allow for easily donning/doffing of apparel   Time 6   Status Achieved     PT LONG TERM GOAL #4   Title Increase ROM so patient is able to reach behind back to L3.   Status On-going     PT LONG TERM GOAL #5   Title ase shoulder strength to a solid 4 to 4+/5 to increase stability for performance of functional activities   Period Weeks   Status On-going     PT LONG TERM GOAL #6   Title Perform ADL's with pain not > 3/10.   Baseline limited reaching overhead and reaching up into upper shelves in her cabinets, unable to lift a gallon of milk with her left UE.    Period Weeks    Status On-going               Plan - 05/25/17 0954    Clinical Impression Statement Pt arrived to clinic today doing fairly well with c/c of heaviness mainly in LT UE. Rx focused on endurance/strength of UE 90 degrees and above in standing, supine and sidelying.   Rehab Potential Good   PT Frequency 2x / week   PT Duration 6 weeks   PT Treatment/Interventions ADLs/Self Care Home Management;Cryotherapy;Electrical Stimulation;Neuromuscular re-education;Therapeutic exercise;Therapeutic activities;Patient/family education;Manual techniques;Vasopneumatic Device;Passive range of motion   PT Next Visit Plan per MD referral:  AAROM to left shoulder; pulleys.  See N.O.  in media  start Tband, work on rhythmic stability   PT Home Exercise Plan Shoulder flexion, ER AAROM   Consulted and Agree with Plan of Care Patient      Patient will benefit from skilled therapeutic intervention in order to improve the following deficits and impairments:  Decreased activity tolerance, Decreased range of motion, Decreased strength, Pain  Visit Diagnosis: Stiffness of left shoulder, not elsewhere classified  Acute pain of left shoulder     Problem List Patient Active Problem List   Diagnosis Date Noted  . Constipation 02/10/2017  . Status post total shoulder arthroplasty, left 02/06/2017  . Elevated transaminase level 01/25/2013  . GERD (gastroesophageal reflux disease) 01/25/2013  . Blood in stool 06/15/2012  . Hypertension 06/15/2012  . High cholesterol 06/15/2012    Remington Highbaugh,CHRIS, PTA 05/25/2017, 10:04 AM  [facility] [place], [place], [address] Phone: [phone]   Fax:  [phone]  Name: LILLYANA MAJETTE MRN: [id number] Date of Birth: [birth year]

## 2017-05-28 ENCOUNTER — Ambulatory Visit: Payer: Medicare Other | Admitting: Physical Therapy

## 2017-05-28 DIAGNOSIS — M25612 Stiffness of left shoulder, not elsewhere classified: Secondary | ICD-10-CM | POA: Diagnosis not present

## 2017-05-28 DIAGNOSIS — M25512 Pain in left shoulder: Secondary | ICD-10-CM | POA: Diagnosis not present

## 2017-05-28 NOTE — Therapy (Signed)
[facility] [place], [place], [address] Phone: [phone]   Fax:  [phone]  Physical Therapy Treatment  Patient Details  Name: OTILIA KAREEM MRN: [id number] Date of Birth: 04-06-1946 Referring Provider: Esmond Plants MD.  Encounter Date: 05/28/2017      PT End of Session - 05/28/17 1707    Visit Number 17   Number of Visits 24   PT Start Time 0400   PT Stop Time 0453   PT Time Calculation (min) 53 min   Activity Tolerance Patient tolerated treatment well   Behavior During Therapy [facility] for tasks assessed/performed      Past Medical History:  Diagnosis Date  . Anxiety   . Arthritis   . Depression   . GERD (gastroesophageal reflux disease)   . High cholesterol   . Hypertension   . Hypothyroid     Past Surgical History:  Procedure Laterality Date  . APPENDECTOMY    . BUNIONECTOMY    . COLONOSCOPY  06/18/2012   Procedure: COLONOSCOPY;  Surgeon: [name], MD;  [facility];  Service: Endoscopy;  Laterality: N/A;  730  . ESOPHAGOGASTRODUODENOSCOPY    . hammer toe repair    . HEMORRHOID SURGERY    . THYROIDECTOMY    . TONSILLECTOMY    . TOTAL ABDOMINAL HYSTERECTOMY    . TOTAL SHOULDER ARTHROPLASTY Left 02/06/2017  . TOTAL SHOULDER ARTHROPLASTY Left 02/06/2017   Procedure: TOTAL SHOULDER ARTHROPLASTY;  Surgeon: [name], MD;  Location: [place];  Service: Orthopedics;  Laterality: Left;    There were no vitals filed for this visit.      Subjective Assessment - 05/28/17 1712    Subjective I feel like my arm needs a massage.   Pain Score 1    Pain Location Shoulder   Pain Orientation Left                         [facility] Adult PT Treatment/Exercise - 05/28/17 0001      Exercises   Exercises Shoulder     Shoulder Exercises: Pulleys   Flexion Limitations 5 minutes.   Other Pulley Exercises UE Ranger on wall x 4 minutes.     Modalities   Modalities Electrical  Stimulation;Moist Heat     Moist Heat Therapy   Number Minutes Moist Heat 20 Minutes   Moist Heat Location --  Left shoulder.     Acupuncturist Location Left shoulder.   Electrical Stimulation Action Pre-mod.   Electrical Stimulation Parameters Constant 80-150 Hz x 20 minutes.   Electrical Stimulation Goals Pain     Manual Therapy   Soft tissue mobilization STW/M x 16 minutes to left UT region/post cuff and middle deltoid.                  PT Short Term Goals - 03/30/17 1649      PT SHORT TERM GOAL #1   Title STG's=LTG's.           PT Long Term Goals - 05/14/17 0959      PT LONG TERM GOAL #1   Title Independent with a HEP.   Time 6   Period Weeks   Status On-going     PT LONG TERM GOAL #2   Title Active right shoulder flexion to 145 degrees so the patient can easily reach overhead.   Baseline 05/14/17 125 degrees    Time 6  Status On-going     PT LONG TERM GOAL #3   Title Active ER to 70 degrees+ to allow for easily donning/doffing of apparel   Time 6   Status Achieved     PT LONG TERM GOAL #4   Title Increase ROM so patient is able to reach behind back to L3.   Status On-going     PT LONG TERM GOAL #5   Title ase shoulder strength to a solid 4 to 4+/5 to increase stability for performance of functional activities   Period Weeks   Status On-going     PT LONG TERM GOAL #6   Title Perform ADL's with pain not > 3/10.   Baseline limited reaching overhead and reaching up into upper shelves in her cabinets, unable to lift a gallon of milk with her left UE.    Period Weeks   Status On-going               Plan - 05/28/17 1718    Clinical Impression Statement Patient reporting the heaviness of her left UE was reduced after STW/M.      Patient will benefit from skilled therapeutic intervention in order to improve the following deficits and impairments:  Decreased activity tolerance, Decreased range of  motion, Decreased strength, Pain  Visit Diagnosis: Stiffness of left shoulder, not elsewhere classified  Acute pain of left shoulder     Problem List Patient Active Problem List   Diagnosis Date Noted  . Constipation 02/10/2017  . Status post total shoulder arthroplasty, left 02/06/2017  . Elevated transaminase level 01/25/2013  . GERD (gastroesophageal reflux disease) 01/25/2013  . Blood in stool 06/15/2012  . Hypertension 06/15/2012  . High cholesterol 06/15/2012    Dhruvi Crenshaw, [place] MPT 05/28/2017, 5:20 PM  [facility] [address] [place], [place], [address] Phone: [phone]   Fax:  [phone]  Name: NAKEISHA GREENHOUSE MRN: [id number] Date of Birth: 13-Sep-1946

## 2017-06-01 ENCOUNTER — Ambulatory Visit: Payer: Medicare Other | Admitting: Physical Therapy

## 2017-06-01 DIAGNOSIS — M25612 Stiffness of left shoulder, not elsewhere classified: Secondary | ICD-10-CM | POA: Diagnosis not present

## 2017-06-01 DIAGNOSIS — M25512 Pain in left shoulder: Secondary | ICD-10-CM

## 2017-06-01 NOTE — Therapy (Signed)
[facility] [place], [place], [address] Phone: [phone]   Fax:  [phone]  Physical Therapy Treatment  Patient Details  Name: [name] MRN: [id number] Date of Birth: Mar 21, 1946 Referring Provider: Esmond Plants MD.  Encounter Date: 06/01/2017      PT End of Session - 06/01/17 1710    Visit Number 18   Number of Visits 24   Date for PT Re-Evaluation 06/24/17   Authorization Type KX modifier at visit 15   PT Start Time 0315   PT Stop Time 0407   PT Time Calculation (min) 52 min   Activity Tolerance Patient tolerated treatment well   Behavior During Therapy [facility] for tasks assessed/performed      Past Medical History:  Diagnosis Date  . Anxiety   . Arthritis   . Depression   . GERD (gastroesophageal reflux disease)   . High cholesterol   . Hypertension   . Hypothyroid     Past Surgical History:  Procedure Laterality Date  . APPENDECTOMY    . BUNIONECTOMY    . COLONOSCOPY  06/18/2012   Procedure: COLONOSCOPY;  Surgeon: [name], MD;  [facility];  Service: Endoscopy;  Laterality: N/A;  730  . ESOPHAGOGASTRODUODENOSCOPY    . hammer toe repair    . HEMORRHOID SURGERY    . THYROIDECTOMY    . TONSILLECTOMY    . TOTAL ABDOMINAL HYSTERECTOMY    . TOTAL SHOULDER ARTHROPLASTY Left 02/06/2017  . TOTAL SHOULDER ARTHROPLASTY Left 02/06/2017   Procedure: TOTAL SHOULDER ARTHROPLASTY;  Surgeon: [name], MD;  Location: [facility];  Service: Orthopedics;  Laterality: Left;    There were no vitals filed for this visit.      Subjective Assessment - 06/01/17 1655    Subjective That last treatment helped and my arm doesn't feel as heavy.   Patient Stated Goals Use left UE without pain.   Pain Score 1    Pain Location Shoulder   Pain Orientation Left   Pain Descriptors / Indicators Sore   Pain Type Surgical pain   Pain Frequency Intermittent                         [facility]  Adult PT Treatment/Exercise - 06/01/17 0001      Exercises   Exercises Shoulder     Shoulder Exercises: Pulleys   Flexion Limitations 6 minutes.     Modalities   Modalities Electrical Stimulation;Moist Heat     Moist Heat Therapy   Number Minutes Moist Heat 20 Minutes   Moist Heat Location --  Left shoulder.     Acupuncturist Location --  Left shoulder.   Electrical Stimulation Action Pre-mod.   Electrical Stimulation Parameters Constant at 80-150 Hz x 20 minutes.   Electrical Stimulation Goals Pain     Manual Therapy   Soft tissue mobilization STW/M x 17 minutes to left shoulder musculature.                  PT Short Term Goals - 03/30/17 1649      PT SHORT TERM GOAL #1   Title STG's=LTG's.           PT Long Term Goals - 05/14/17 0959      PT LONG TERM GOAL #1   Title Independent with a HEP.   Time 6   Period Weeks   Status On-going  PT LONG TERM GOAL #2   Title Active right shoulder flexion to 145 degrees so the patient can easily reach overhead.   Baseline 05/14/17 125 degrees    Time 6   Status On-going     PT LONG TERM GOAL #3   Title Active ER to 70 degrees+ to allow for easily donning/doffing of apparel   Time 6   Status Achieved     PT LONG TERM GOAL #4   Title Increase ROM so patient is able to reach behind back to L3.   Status On-going     PT LONG TERM GOAL #5   Title ase shoulder strength to a solid 4 to 4+/5 to increase stability for performance of functional activities   Period Weeks   Status On-going     PT LONG TERM GOAL #6   Title Perform ADL's with pain not > 3/10.   Baseline limited reaching overhead and reaching up into upper shelves in her cabinets, unable to lift a gallon of milk with her left UE.    Period Weeks   Status On-going               Plan - 06/01/17 1711    Clinical Impression Statement Excellent response to left shoulder soft tissue work.     PT Next Visit  Plan Progress with left shoulder thera ex.      Patient will benefit from skilled therapeutic intervention in order to improve the following deficits and impairments:     Visit Diagnosis: Stiffness of left shoulder, not elsewhere classified  Acute pain of left shoulder     Problem List Patient Active Problem List   Diagnosis Date Noted  . Constipation 02/10/2017  . Status post total shoulder arthroplasty, left 02/06/2017  . Elevated transaminase level 01/25/2013  . GERD (gastroesophageal reflux disease) 01/25/2013  . Blood in stool 06/15/2012  . Hypertension 06/15/2012  . High cholesterol 06/15/2012    APPLE[place], [place] MPT 06/01/2017, 5:13 PM  [facility] [address] [place], [place], [address] Phone: [phone]   Fax:  [phone]  Name: ASHLYE OVIEDO MRN: [id number] Date of Birth: 1946/03/29

## 2017-06-04 ENCOUNTER — Ambulatory Visit: Payer: Medicare Other | Admitting: Physical Therapy

## 2017-06-04 DIAGNOSIS — M25612 Stiffness of left shoulder, not elsewhere classified: Secondary | ICD-10-CM

## 2017-06-04 DIAGNOSIS — M25512 Pain in left shoulder: Secondary | ICD-10-CM

## 2017-06-04 NOTE — Therapy (Signed)
[facility] [place], [place], [address] Phone: [phone]   Fax:  [phone]  Physical Therapy Treatment  Patient Details  Name: [name] MRN: [id number] Date of Birth: 09/03/1946 Referring Provider: Esmond Plants MD.  Encounter Date: 06/04/2017      PT End of Session - 06/04/17 1204    Visit Number 19   Number of Visits 24   Date for PT Re-Evaluation 06/24/17   Authorization Type KX modifier at visit 15   PT Start Time 1115   PT Stop Time 1204   PT Time Calculation (min) 49 min   Activity Tolerance Patient tolerated treatment well   Behavior During Therapy [facility] for tasks assessed/performed      Past Medical History:  Diagnosis Date  . Anxiety   . Arthritis   . Depression   . GERD (gastroesophageal reflux disease)   . High cholesterol   . Hypertension   . Hypothyroid     Past Surgical History:  Procedure Laterality Date  . APPENDECTOMY    . BUNIONECTOMY    . COLONOSCOPY  06/18/2012   Procedure: COLONOSCOPY;  Surgeon: [name], MD;  [facility];  Service: Endoscopy;  Laterality: N/A;  730  . ESOPHAGOGASTRODUODENOSCOPY    . hammer toe repair    . HEMORRHOID SURGERY    . THYROIDECTOMY    . TONSILLECTOMY    . TOTAL ABDOMINAL HYSTERECTOMY    . TOTAL SHOULDER ARTHROPLASTY Left 02/06/2017  . TOTAL SHOULDER ARTHROPLASTY Left 02/06/2017   Procedure: TOTAL SHOULDER ARTHROPLASTY;  Surgeon: [name], MD;  Location: [facility];  Service: Orthopedics;  Laterality: Left;    There were no vitals filed for this visit.      Subjective Assessment - 06/04/17 1201    Subjective That massage work has helped a lot.  I would like to do it again and then exercise next week.   Patient Stated Goals Use left UE without pain.   Pain Score 1    Pain Location Shoulder   Pain Orientation Left   Pain Descriptors / Indicators Sore   Pain Type Surgical pain                          [facility] Adult PT Treatment/Exercise - 06/04/17 0001      Shoulder Exercises: Pulleys   Flexion Limitations 5 minutes.   Other Pulley Exercises UE Ranger on wall x 5 minutes.     Modalities   Modalities Electrical Stimulation     Moist Heat Therapy   Number Minutes Moist Heat 20 Minutes   Moist Heat Location --  Left shoulder.     Acupuncturist Location Left shoulder.   Electrical Stimulation Action Pre-mod.   Electrical Stimulation Parameters Constant at 80-150 Hz x 20 minutes.   Electrical Stimulation Goals Tone;Pain     Manual Therapy   Soft tissue mobilization STW/M an dTP release to left deltoid region x 13 minutes.                  PT Short Term Goals - 03/30/17 1649      PT SHORT TERM GOAL #1   Title STG's=LTG's.           PT Long Term Goals - 05/14/17 0959      PT LONG TERM GOAL #1   Title Independent with a HEP.   Time 6   Period Weeks  Status On-going     PT LONG TERM GOAL #2   Title Active right shoulder flexion to 145 degrees so the patient can easily reach overhead.   Baseline 05/14/17 125 degrees    Time 6   Status On-going     PT LONG TERM GOAL #3   Title Active ER to 70 degrees+ to allow for easily donning/doffing of apparel   Time 6   Status Achieved     PT LONG TERM GOAL #4   Title Increase ROM so patient is able to reach behind back to L3.   Status On-going     PT LONG TERM GOAL #5   Title ase shoulder strength to a solid 4 to 4+/5 to increase stability for performance of functional activities   Period Weeks   Status On-going     PT LONG TERM GOAL #6   Title Perform ADL's with pain not > 3/10.   Baseline limited reaching overhead and reaching up into upper shelves in her cabinets, unable to lift a gallon of milk with her left UE.    Period Weeks   Status On-going             Patient will benefit from skilled therapeutic intervention in order to improve the following deficits and  impairments:     Visit Diagnosis: Stiffness of left shoulder, not elsewhere classified  Acute pain of left shoulder     Problem List Patient Active Problem List   Diagnosis Date Noted  . Constipation 02/10/2017  . Status post total shoulder arthroplasty, left 02/06/2017  . Elevated transaminase level 01/25/2013  . GERD (gastroesophageal reflux disease) 01/25/2013  . Blood in stool 06/15/2012  . Hypertension 06/15/2012  . High cholesterol 06/15/2012    [name], [name] 06/04/2017, 1:01 PM  [facility] [place], [place], [address] Phone: [phone]   Fax:  [phone]  Name: [name] MRN: [id number] Date of Birth: 1946/10/30

## 2017-06-08 ENCOUNTER — Ambulatory Visit: Payer: Medicare Other | Admitting: Physical Therapy

## 2017-06-08 DIAGNOSIS — M25612 Stiffness of left shoulder, not elsewhere classified: Secondary | ICD-10-CM

## 2017-06-08 DIAGNOSIS — M25512 Pain in left shoulder: Secondary | ICD-10-CM

## 2017-06-08 NOTE — Therapy (Signed)
[facility] [place], [place], [address] Phone: [phone]   Fax:  [phone]  Physical Therapy Treatment  Patient Details  Name: [name] MRN: [id number] Date of Birth: 09-21-1946 Referring Provider: Esmond Plants MD.  Encounter Date: 06/08/2017      PT End of Session - 06/08/17 0904    Visit Number 20   Number of Visits 24   Date for PT Re-Evaluation 06/24/17   Authorization Type KX modifier at visit 15   PT Start Time 0900   PT Stop Time 0943   PT Time Calculation (min) 43 min   Activity Tolerance Patient tolerated treatment well   Behavior During Therapy [facility] for tasks assessed/performed      Past Medical History:  Diagnosis Date  . Anxiety   . Arthritis   . Depression   . GERD (gastroesophageal reflux disease)   . High cholesterol   . Hypertension   . Hypothyroid     Past Surgical History:  Procedure Laterality Date  . APPENDECTOMY    . BUNIONECTOMY    . COLONOSCOPY  06/18/2012   Procedure: COLONOSCOPY;  Surgeon: [name], MD;  [facility];  Service: Endoscopy;  Laterality: N/A;  730  . ESOPHAGOGASTRODUODENOSCOPY    . hammer toe repair    . HEMORRHOID SURGERY    . THYROIDECTOMY    . TONSILLECTOMY    . TOTAL ABDOMINAL HYSTERECTOMY    . TOTAL SHOULDER ARTHROPLASTY Left 02/06/2017  . TOTAL SHOULDER ARTHROPLASTY Left 02/06/2017   Procedure: TOTAL SHOULDER ARTHROPLASTY;  Surgeon: [name], MD;  Location: [facility];  Service: Orthopedics;  Laterality: Left;    There were no vitals filed for this visit.      Subjective Assessment - 06/08/17 0903    Subjective My arm feels better but is more sore farther down the arm now.   Pain Score 1    Pain Location Shoulder   Pain Orientation Left   Pain Descriptors / Indicators Sore   Pain Type Surgical pain   Pain Frequency Intermittent     Treatment:  Pulleys x 5 minutes f/b UE Ranger on wall x 5 minutes f/b wall ladder x 5  minutes f/b seated AAROM to left shoulder into flexion; ER, also include rhy stabs (total 8 minutes) f/b Pre-mod e'stim and medium vasopneumatic x 15 minutes.  Great job today.                                PT Short Term Goals - 03/30/17 1649      PT SHORT TERM GOAL #1   Title STG's=LTG's.           PT Long Term Goals - 05/14/17 0959      PT LONG TERM GOAL #1   Title Independent with a HEP.   Time 6   Period Weeks   Status On-going     PT LONG TERM GOAL #2   Title Active right shoulder flexion to 145 degrees so the patient can easily reach overhead.   Baseline 05/14/17 125 degrees    Time 6   Status On-going     PT LONG TERM GOAL #3   Title Active ER to 70 degrees+ to allow for easily donning/doffing of apparel   Time 6   Status Achieved     PT LONG TERM GOAL #4   Title Increase ROM so patient is able to  reach behind back to L3.   Status On-going     PT LONG TERM GOAL #5   Title ase shoulder strength to a solid 4 to 4+/5 to increase stability for performance of functional activities   Period Weeks   Status On-going     PT LONG TERM GOAL #6   Title Perform ADL's with pain not > 3/10.   Baseline limited reaching overhead and reaching up into upper shelves in her cabinets, unable to lift a gallon of milk with her left UE.    Period Weeks   Status On-going             Patient will benefit from skilled therapeutic intervention in order to improve the following deficits and impairments:     Visit Diagnosis: Stiffness of left shoulder, not elsewhere classified  Acute pain of left shoulder       G-Codes - 06/14/2017 0904    Functional Assessment Tool Used (Outpatient Only) Clinical judgement....20th visit.   Functional Limitation Self care   Self Care Current Status [phone]) At least 20 percent but less than 40 percent impaired, limited or restricted   Self Care Goal Status (U0454) At least 20 percent but less than 40 percent  impaired, limited or restricted      Problem List Patient Active Problem List   Diagnosis Date Noted  . Constipation 02/10/2017  . Status post total shoulder arthroplasty, left 02/06/2017  . Elevated transaminase level 01/25/2013  . GERD (gastroesophageal reflux disease) 01/25/2013  . Blood in stool 06/15/2012  . Hypertension 06/15/2012  . High cholesterol 06/15/2012    [name], [place] MPT 06/14/17, 9:43 AM  [facility] [address] [place], [place], [address] Phone: [phone]   Fax:  [phone]  Name: [name] MRN: [id number] Date of Birth: [birth year]

## 2017-06-11 ENCOUNTER — Encounter: Payer: Self-pay | Admitting: Physical Therapy

## 2017-06-11 ENCOUNTER — Ambulatory Visit: Payer: Medicare Other | Admitting: Physical Therapy

## 2017-06-11 DIAGNOSIS — M25512 Pain in left shoulder: Secondary | ICD-10-CM | POA: Diagnosis not present

## 2017-06-11 DIAGNOSIS — M25612 Stiffness of left shoulder, not elsewhere classified: Secondary | ICD-10-CM

## 2017-06-11 NOTE — Therapy (Signed)
[facility] [place], [place], [address] Phone: [phone]   Fax:  [phone]  Physical Therapy Treatment  Patient Details  Name: [name] MRN: [id number] Date of Birth: [birth year] Referring Provider: Esmond Plants MD.  Encounter Date: 06/11/2017      PT End of Session - 06/11/17 0950    Visit Number 21   Number of Visits 24   Date for PT Re-Evaluation 06/24/17   Authorization Type KX modifier at visit 15   PT Start Time 0945   PT Stop Time 1041   PT Time Calculation (min) 56 min   Activity Tolerance Patient tolerated treatment well   Behavior During Therapy [facility] for tasks assessed/performed      Past Medical History:  Diagnosis Date  . Anxiety   . Arthritis   . Depression   . GERD (gastroesophageal reflux disease)   . High cholesterol   . Hypertension   . Hypothyroid     Past Surgical History:  Procedure Laterality Date  . APPENDECTOMY    . BUNIONECTOMY    . COLONOSCOPY  06/18/2012   Procedure: COLONOSCOPY;  Surgeon: [name], MD;  [facility];  Service: Endoscopy;  Laterality: N/A;  730  . ESOPHAGOGASTRODUODENOSCOPY    . hammer toe repair    . HEMORRHOID SURGERY    . THYROIDECTOMY    . TONSILLECTOMY    . TOTAL ABDOMINAL HYSTERECTOMY    . TOTAL SHOULDER ARTHROPLASTY Left 02/06/2017  . TOTAL SHOULDER ARTHROPLASTY Left 02/06/2017   Procedure: TOTAL SHOULDER ARTHROPLASTY;  Surgeon: [name], MD;  Location: [facility];  Service: Orthopedics;  Laterality: Left;    There were no vitals filed for this visit.      Subjective Assessment - 06/11/17 0949    Subjective Reports she can reach her most important shelves and feels approximately 75% better.   Patient Stated Goals Use left UE without pain.   Currently in Pain? No/denies            [facility] PT Assessment - 06/11/17 0001      Assessment   Medical Diagnosis left total shoulder   Onset Date/Surgical Date 02/06/17   Next  MD Visit 07/01/2017     Restrictions   Weight Bearing Restrictions No                     [facility] Adult PT Treatment/Exercise - 06/11/17 0001      Exercises   Exercises Shoulder;Elbow     Elbow Exercises   Elbow Flexion Strengthening;Left;Other reps (comment);Seated;Bar weights/barbell   Bar Weights/Barbell (Elbow Flexion) 3 lbs   Elbow Flexion Limitations x30 reps     Shoulder Exercises: Seated   Flexion AROM;AAROM;Left  to fatigue with end range assist   Abduction AROM;Left  x30 reps     Shoulder Exercises: Pulleys   Flexion Other (comment)  x5 min   Other Pulley Exercises UE ranger into flex/circles x5 min     Modalities   Modalities Electrical Stimulation;Vasopneumatic     Moist Heat Therapy   Number Minutes Moist Heat 15 Minutes   Moist Heat Location Shoulder     Electrical Stimulation   Electrical Stimulation Location L shoulder   Electrical Stimulation Action Pre-Mod   Electrical Stimulation Parameters 80-150 hz x15 min   Electrical Stimulation Goals Tone;Pain     Manual Therapy   Manual Therapy Soft tissue mobilization   Soft tissue mobilization STW to L deltoids, bicep to reduce pain  and soreness                  PT Short Term Goals - 03/30/17 1649      PT SHORT TERM GOAL #1   Title STG's=LTG's.           PT Long Term Goals - 05/14/17 0959      PT LONG TERM GOAL #1   Title Independent with a HEP.   Time 6   Period Weeks   Status On-going     PT LONG TERM GOAL #2   Title Active right shoulder flexion to 145 degrees so the patient can easily reach overhead.   Baseline 05/14/17 125 degrees    Time 6   Status On-going     PT LONG TERM GOAL #3   Title Active ER to 70 degrees+ to allow for easily donning/doffing of apparel   Time 6   Status Achieved     PT LONG TERM GOAL #4   Title Increase ROM so patient is able to reach behind back to L3.   Status On-going     PT LONG TERM GOAL #5   Title ase shoulder strength to a  solid 4 to 4+/5 to increase stability for performance of functional activities   Period Weeks   Status On-going     PT LONG TERM GOAL #6   Title Perform ADL's with pain not > 3/10.   Baseline limited reaching overhead and reaching up into upper shelves in her cabinets, unable to lift a gallon of milk with her left UE.    Period Weeks   Status On-going               Plan - 06/11/17 1038    Clinical Impression Statement Patient tolerated today's treatment well although still limited with shoulder flexion and weakness. Patient able to complete all exercises well but as fatigue sets in technique decreases. Assist at end range completed in sitting into flexion to fatigue. Tenderness reported by patient especially along L bicep, anterior and posterior deltoid as well as deltoid attachment region. Normal modalities response noted following removal of the modalities.   Rehab Potential Good   PT Frequency 2x / week   PT Duration 6 weeks   PT Treatment/Interventions ADLs/Self Care Home Management;Cryotherapy;Electrical Stimulation;Neuromuscular re-education;Therapeutic exercise;Therapeutic activities;Patient/family education;Manual techniques;Vasopneumatic Device;Passive range of motion   PT Next Visit Plan Progress with left shoulder thera ex.   PT Home Exercise Plan Shoulder flexion, ER AAROM   Consulted and Agree with Plan of Care Patient      Patient will benefit from skilled therapeutic intervention in order to improve the following deficits and impairments:  Decreased activity tolerance, Decreased range of motion, Decreased strength, Pain  Visit Diagnosis: Stiffness of left shoulder, not elsewhere classified  Acute pain of left shoulder     Problem List Patient Active Problem List   Diagnosis Date Noted  . Constipation 02/10/2017  . Status post total shoulder arthroplasty, left 02/06/2017  . Elevated transaminase level 01/25/2013  . GERD (gastroesophageal reflux disease)  01/25/2013  . Blood in stool 06/15/2012  . Hypertension 06/15/2012  . High cholesterol 06/15/2012    Wynelle Fanny, PTA 06/11/2017, 11:14 AM  [facility] [place], [place], [address] Phone: [phone]   Fax:  [phone]  Name: [name] MRN: [id number] Date of Birth: February 11, 1946

## 2017-06-22 ENCOUNTER — Encounter: Payer: No Typology Code available for payment source | Admitting: Physical Therapy

## 2017-06-23 DIAGNOSIS — I10 Essential (primary) hypertension: Secondary | ICD-10-CM | POA: Diagnosis not present

## 2017-06-23 DIAGNOSIS — R350 Frequency of micturition: Secondary | ICD-10-CM | POA: Diagnosis not present

## 2017-06-23 DIAGNOSIS — Z713 Dietary counseling and surveillance: Secondary | ICD-10-CM | POA: Diagnosis not present

## 2017-06-23 DIAGNOSIS — Z6832 Body mass index (BMI) 32.0-32.9, adult: Secondary | ICD-10-CM | POA: Diagnosis not present

## 2017-06-23 DIAGNOSIS — E039 Hypothyroidism, unspecified: Secondary | ICD-10-CM | POA: Diagnosis not present

## 2017-06-23 DIAGNOSIS — E7800 Pure hypercholesterolemia, unspecified: Secondary | ICD-10-CM | POA: Diagnosis not present

## 2017-06-23 DIAGNOSIS — Z299 Encounter for prophylactic measures, unspecified: Secondary | ICD-10-CM | POA: Diagnosis not present

## 2017-06-23 DIAGNOSIS — R5383 Other fatigue: Secondary | ICD-10-CM | POA: Diagnosis not present

## 2017-06-24 ENCOUNTER — Ambulatory Visit: Payer: Medicare Other | Attending: Orthopedic Surgery | Admitting: *Deleted

## 2017-06-24 DIAGNOSIS — M25512 Pain in left shoulder: Secondary | ICD-10-CM | POA: Diagnosis not present

## 2017-06-24 DIAGNOSIS — M25612 Stiffness of left shoulder, not elsewhere classified: Secondary | ICD-10-CM | POA: Diagnosis not present

## 2017-06-24 NOTE — Therapy (Signed)
[facility] [place], [place], [address] Phone: [phone]   Fax:  [phone]  Physical Therapy Treatment  Patient Details  Name: [name] MRN: [id number] Date of Birth: January 06, 1946 Referring Provider: Esmond Plants MD.  Encounter Date: 06/24/2017      PT End of Session - 06/24/17 1159    Visit Number 22   Number of Visits 24   Date for PT Re-Evaluation 06/24/17   Authorization Type KX modifier at visit 15   PT Start Time 1115   PT Stop Time 1205   PT Time Calculation (min) 50 min      Past Medical History:  Diagnosis Date  . Anxiety   . Arthritis   . Depression   . GERD (gastroesophageal reflux disease)   . High cholesterol   . Hypertension   . Hypothyroid     Past Surgical History:  Procedure Laterality Date  . APPENDECTOMY    . BUNIONECTOMY    . COLONOSCOPY  06/18/2012   Procedure: COLONOSCOPY;  Surgeon: [name], MD;  [facility];  Service: Endoscopy;  Laterality: N/A;  730  . ESOPHAGOGASTRODUODENOSCOPY    . hammer toe repair    . HEMORRHOID SURGERY    . THYROIDECTOMY    . TONSILLECTOMY    . TOTAL ABDOMINAL HYSTERECTOMY    . TOTAL SHOULDER ARTHROPLASTY Left 02/06/2017  . TOTAL SHOULDER ARTHROPLASTY Left 02/06/2017   Procedure: TOTAL SHOULDER ARTHROPLASTY;  Surgeon: [name], MD;  Location: [place];  Service: Orthopedics;  Laterality: Left;    There were no vitals filed for this visit.      Subjective Assessment - 06/24/17 1123    Subjective Reports she can reach her most important shelves and feels approximately 75% better.   Patient Stated Goals Use left UE without pain.   Currently in Pain? No/denies                         [facility] Adult PT Treatment/Exercise - 06/24/17 0001      Exercises   Exercises Shoulder;Elbow     Elbow Exercises   Elbow Flexion Strengthening;Left;Other reps (comment);Seated;Bar weights/barbell   Bar Weights/Barbell  (Elbow Flexion) 3 lbs   Elbow Flexion Limitations x30     Shoulder Exercises: Seated   Flexion AROM;AAROM;Left  to fatigue with end range assist   Abduction AROM;Left  x30 reps     Shoulder Exercises: Prone   Retraction Strengthening;Left;20 reps;10 reps   Retraction Weight (lbs) 2   Extension Strengthening;Left;10 reps;20 reps   Extension Weight (lbs) 2   External Rotation Strengthening;Left;10 reps;20 reps;Theraband   Theraband Level (Shoulder External Rotation) Level 1 (Yellow)  3x10   Internal Rotation Strengthening;Left;10 reps;20 reps;Theraband   Theraband Level (Shoulder Internal Rotation) Level 1 (Yellow)     Shoulder Exercises: Standing   Other Standing Exercises 2# ball diagonal reaches 2x10, 2# Bil UE raise 3x10     Shoulder Exercises: Pulleys   Flexion Other (comment)  x5 min   Other Pulley Exercises UE Ranger on wall x 5 minutes.     Modalities   Modalities Electrical Stimulation;Vasopneumatic     Moist Heat Therapy   Number Minutes Moist Heat --   Moist Heat Location --     Electrical Stimulation   Electrical Stimulation Location L shoulder  premod x 15 mins 80-150hz    Electrical Stimulation Goals Tone;Pain     Vasopneumatic   Number Minutes Vasopneumatic  15 minutes  Vasopnuematic Location  Shoulder   Vasopneumatic Pressure Medium   Vasopneumatic Temperature  34     Manual Therapy   Passive ROM shoulder flexion AAROM in standing to reach available ROM 3 x10                  PT Short Term Goals - 03/30/17 1649      PT SHORT TERM GOAL #1   Title STG's=LTG's.           PT Long Term Goals - 05/14/17 0959      PT LONG TERM GOAL #1   Title Independent with a HEP.   Time 6   Period Weeks   Status On-going     PT LONG TERM GOAL #2   Title Active right shoulder flexion to 145 degrees so the patient can easily reach overhead.   Baseline 05/14/17 125 degrees    Time 6   Status On-going     PT LONG TERM GOAL #3   Title Active ER  to 70 degrees+ to allow for easily donning/doffing of apparel   Time 6   Status Achieved     PT LONG TERM GOAL #4   Title Increase ROM so patient is able to reach behind back to L3.   Status On-going     PT LONG TERM GOAL #5   Title ase shoulder strength to a solid 4 to 4+/5 to increase stability for performance of functional activities   Period Weeks   Status On-going     PT LONG TERM GOAL #6   Title Perform ADL's with pain not > 3/10.   Baseline limited reaching overhead and reaching up into upper shelves in her cabinets, unable to lift a gallon of milk with her left UE.    Period Weeks   Status On-going               Plan - 06/24/17 1202    Clinical Impression Statement Pt arrived today to clinic doing fairly well with LT shldr. She was able to complete all therex and new PREs without pain  and mainly fatigue. Full elevation in standing and ER was the most challenging still.   Rehab Potential Good   PT Frequency 2x / week   PT Duration 6 weeks   PT Treatment/Interventions ADLs/Self Care Home Management;Cryotherapy;Electrical Stimulation;Neuromuscular re-education;Therapeutic exercise;Therapeutic activities;Patient/family education;Manual techniques;Vasopneumatic Device;Passive range of motion   PT Next Visit Plan Progress with left shoulder thera ex.  MD note next visit   PT Home Exercise Plan Shoulder flexion, ER AAROM   Consulted and Agree with Plan of Care Patient      Patient will benefit from skilled therapeutic intervention in order to improve the following deficits and impairments:  Decreased activity tolerance, Decreased range of motion, Decreased strength, Pain  Visit Diagnosis: Stiffness of left shoulder, not elsewhere classified  Acute pain of left shoulder     Problem List Patient Active Problem List   Diagnosis Date Noted  . Constipation 02/10/2017  . Status post total shoulder arthroplasty, left 02/06/2017  . Elevated transaminase level  01/25/2013  . GERD (gastroesophageal reflux disease) 01/25/2013  . Blood in stool 06/15/2012  . Hypertension 06/15/2012  . High cholesterol 06/15/2012    RAMSEUR,CHRIS , PTA [facility] [address] [place], [place], [address] Phone: [phone]   Fax:  [phone]  Name: [name] MRN: [id number] Date of Birth: Sep 03, 1946

## 2017-06-25 ENCOUNTER — Encounter: Payer: Self-pay | Admitting: Physical Therapy

## 2017-06-25 ENCOUNTER — Ambulatory Visit: Payer: Medicare Other | Admitting: Physical Therapy

## 2017-06-25 DIAGNOSIS — M25512 Pain in left shoulder: Secondary | ICD-10-CM

## 2017-06-25 DIAGNOSIS — M25612 Stiffness of left shoulder, not elsewhere classified: Secondary | ICD-10-CM | POA: Diagnosis not present

## 2017-06-25 NOTE — Therapy (Addendum)
[facility] [place], [place], [address] Phone: [phone]   Fax:  [phone]  Physical Therapy Treatment  Patient Details  Name: [name] MRN: [id number] Date of Birth: 28-May-1946 Referring Provider: Esmond Plants MD.  Encounter Date: 06/25/2017      PT End of Session - 06/25/17 0901    Visit Number 23   Number of Visits 24   Date for PT Re-Evaluation 06/24/17   Authorization Type KX modifier at visit 15   PT Start Time 0900   PT Stop Time 0945   PT Time Calculation (min) 45 min   Activity Tolerance Patient tolerated treatment well   Behavior During Therapy [facility] for tasks assessed/performed      Past Medical History:  Diagnosis Date  . Anxiety   . Arthritis   . Depression   . GERD (gastroesophageal reflux disease)   . High cholesterol   . Hypertension   . Hypothyroid     Past Surgical History:  Procedure Laterality Date  . APPENDECTOMY    . BUNIONECTOMY    . COLONOSCOPY  06/18/2012   Procedure: COLONOSCOPY;  Surgeon: [name], MD;  [facility];  Service: Endoscopy;  Laterality: N/A;  730  . ESOPHAGOGASTRODUODENOSCOPY    . hammer toe repair    . HEMORRHOID SURGERY    . THYROIDECTOMY    . TONSILLECTOMY    . TOTAL ABDOMINAL HYSTERECTOMY    . TOTAL SHOULDER ARTHROPLASTY Left 02/06/2017  . TOTAL SHOULDER ARTHROPLASTY Left 02/06/2017   Procedure: TOTAL SHOULDER ARTHROPLASTY;  Surgeon: [name], MD;  Location: [place];  Service: Orthopedics;  Laterality: Left;    There were no vitals filed for this visit.      Subjective Assessment - 06/25/17 0900    Subjective Reports that she is doing good and says the MD may release her this morning.   Patient Stated Goals Use left UE without pain.   Currently in Pain? No/denies            [facility] PT Assessment - 06/25/17 0001      Assessment   Medical Diagnosis left total shoulder   Onset Date/Surgical Date 02/06/17   Next MD Visit  07/01/2017     Restrictions   Weight Bearing Restrictions No     ROM / Strength   AROM / PROM / Strength AROM;Strength     AROM   Overall AROM  Deficits   AROM Assessment Site Shoulder   Right/Left Shoulder Left   Left Shoulder Flexion 115 Degrees   Left Shoulder Internal Rotation 70 Degrees   Left Shoulder External Rotation 55 Degrees     Strength   Overall Strength Deficits   Strength Assessment Site Shoulder   Right/Left Shoulder Left   Left Shoulder Flexion 4-/5   Left Shoulder ABduction 3+/5   Left Shoulder Internal Rotation 4/5   Left Shoulder External Rotation 4/5                     [facility] Adult PT Treatment/Exercise - 06/25/17 0001      Elbow Exercises   Elbow Flexion Strengthening;Left;Other reps (comment);Seated;Bar weights/barbell   Bar Weights/Barbell (Elbow Flexion) 3 lbs   Elbow Flexion Limitations x30 reps     Shoulder Exercises: Prone   Retraction Strengthening;Left;Weights   Retraction Weight (lbs) 2   Retraction Limitations 3x10 reps   Extension Strengthening;Left;Weights  3x10 reps   Extension Weight (lbs) 2   External Rotation Strengthening;Left;Theraband  Theraband Level (Shoulder External Rotation) Level 1 (Yellow)   External Rotation Limitations 3x10 reps   Internal Rotation Strengthening;Left;Theraband   Theraband Level (Shoulder Internal Rotation) Level 1 (Yellow)   Internal Rotation Limitations 3x10 reps   Horizontal ABduction 1 AROM;Strengthening;Left;20 reps;Weights  AROM x20 reps. 1# x20 reps   Other Prone Exercises Wall slides with lift off x25 reps     Shoulder Exercises: Pulleys   Flexion Other (comment)  x5 min     Modalities   Modalities Electrical Stimulation;Vasopneumatic     Electrical Stimulation   Electrical Stimulation Location L shoulder   Electrical Stimulation Action Pre-Mod   Electrical Stimulation Parameters 80-150 hz x15 min   Electrical Stimulation Goals Pain     Vasopneumatic   Number Minutes  Vasopneumatic  15 minutes   Vasopnuematic Location  Shoulder   Vasopneumatic Pressure Low   Vasopneumatic Temperature  55                  PT Short Term Goals - 03/30/17 1649      PT SHORT TERM GOAL #1   Title STG's=LTG's.           PT Long Term Goals - 06/25/17 0919      PT LONG TERM GOAL #1   Title Independent with a HEP.   Time 6   Period Weeks   Status Achieved     PT LONG TERM GOAL #2   Title Active right shoulder flexion to 145 degrees so the patient can easily reach overhead.   Baseline 05/14/17 125 degrees    Time 6   Status Not Met  115 deg L shoulder AROM 06/25/2017     PT LONG TERM GOAL #3   Title Active ER to 70 degrees+ to allow for easily donning/doffing of apparel   Time 6   Status Not Met  AROM L shoulder ER 55 deg 06/25/2017     PT LONG TERM GOAL #4   Title Increase ROM so patient is able to reach behind back to L3.   Status Not Met  Approximately L5 06/25/2017     PT LONG TERM GOAL #5   Title ase shoulder strength to a solid 4 to 4+/5 to increase stability for performance of functional activities   Period Weeks   Status Partially Met  3+/5-4/5 L shoulder MMT 06/25/2017     PT LONG TERM GOAL #6   Title Perform ADL's with pain not > 3/10.   Baseline limited reaching overhead and reaching up into upper shelves in her cabinets, unable to lift a gallon of milk with her left UE.    Period Weeks   Status Achieved               Plan - 06/25/17 0935    Clinical Impression Statement Patient has progressed fairly well following L TSR 02/06/2017. Patient continues to have strength and slight ROM deficits although she reports that she is functional with reaching actvities at home. Patient limited with goal achievement today in regards to shoulder flexion, ER and IR as well as strength. No complaints of pain with any exercises today mainly fatigue but did report pain with MMT assessment of L shoulder abduction. Normal modalities response noted  following removal of the modalities.   Rehab Potential Good   PT Frequency 2x / week   PT Duration 6 weeks   PT Treatment/Interventions ADLs/Self Care Home Management;Cryotherapy;Electrical Stimulation;Neuromuscular re-education;Therapeutic exercise;Therapeutic activities;Patient/family education;Manual techniques;Vasopneumatic Device;Passive range of motion   PT  Next Visit Plan Please sign and route MD note to Dr. Veverly Fells for possible discharge.   PT Home Exercise Plan Shoulder flexion, ER AAROM   Consulted and Agree with Plan of Care Patient      Patient will benefit from skilled therapeutic intervention in order to improve the following deficits and impairments:  Decreased activity tolerance, Decreased range of motion, Decreased strength, Pain  Visit Diagnosis: Stiffness of left shoulder, not elsewhere classified  Acute pain of left shoulder     Problem List Patient Active Problem List   Diagnosis Date Noted  . Constipation 02/10/2017  . Status post total shoulder arthroplasty, left 02/06/2017  . Elevated transaminase level 01/25/2013  . GERD (gastroesophageal reflux disease) 01/25/2013  . Blood in stool 06/15/2012  . Hypertension 06/15/2012  . High cholesterol 06/15/2012   Ahmed Prima, PTA 06/25/17 9:52 AM  [facility] [place], [place], [address] Phone: [phone]   Fax:  [phone]  Name: [name] MRN: [id number] Date of Birth: Jun 15, 1946  PHYSICAL THERAPY DISCHARGE SUMMARY  Visits from Start of Care: 23.  Current functional level related to goals / functional outcomes: See above.   Remaining deficits: See goal section.   Education / Equipment: HEP. Plan: Patient agrees to discharge.  Patient goals were partially met. Patient is being discharged due to                                                     ?????         Mali Applegate MPT

## 2017-06-29 ENCOUNTER — Encounter: Payer: No Typology Code available for payment source | Admitting: Physical Therapy

## 2017-06-30 ENCOUNTER — Ambulatory Visit: Payer: Medicare Other

## 2017-07-01 DIAGNOSIS — M170 Bilateral primary osteoarthritis of knee: Secondary | ICD-10-CM | POA: Diagnosis not present

## 2017-07-01 DIAGNOSIS — Z471 Aftercare following joint replacement surgery: Secondary | ICD-10-CM | POA: Diagnosis not present

## 2017-07-01 DIAGNOSIS — Z96612 Presence of left artificial shoulder joint: Secondary | ICD-10-CM | POA: Diagnosis not present

## 2017-08-27 DIAGNOSIS — Z1231 Encounter for screening mammogram for malignant neoplasm of breast: Secondary | ICD-10-CM | POA: Diagnosis not present

## 2017-09-13 DIAGNOSIS — Z23 Encounter for immunization: Secondary | ICD-10-CM | POA: Diagnosis not present

## 2017-09-14 DIAGNOSIS — H40033 Anatomical narrow angle, bilateral: Secondary | ICD-10-CM | POA: Diagnosis not present

## 2017-09-14 DIAGNOSIS — H2513 Age-related nuclear cataract, bilateral: Secondary | ICD-10-CM | POA: Diagnosis not present

## 2017-09-23 DIAGNOSIS — E669 Obesity, unspecified: Secondary | ICD-10-CM | POA: Diagnosis not present

## 2017-09-23 DIAGNOSIS — L570 Actinic keratosis: Secondary | ICD-10-CM | POA: Diagnosis not present

## 2017-09-23 DIAGNOSIS — Z299 Encounter for prophylactic measures, unspecified: Secondary | ICD-10-CM | POA: Diagnosis not present

## 2017-09-23 DIAGNOSIS — Z8639 Personal history of other endocrine, nutritional and metabolic disease: Secondary | ICD-10-CM | POA: Diagnosis not present

## 2017-10-01 DIAGNOSIS — H25813 Combined forms of age-related cataract, bilateral: Secondary | ICD-10-CM | POA: Diagnosis not present

## 2017-10-01 DIAGNOSIS — H52223 Regular astigmatism, bilateral: Secondary | ICD-10-CM | POA: Diagnosis not present

## 2017-10-01 DIAGNOSIS — H25811 Combined forms of age-related cataract, right eye: Secondary | ICD-10-CM | POA: Diagnosis not present

## 2017-10-05 NOTE — Patient Instructions (Signed)
Your procedure is scheduled on: 10/09/2017   Report to [facility] at  [phone]  AM.  Call this number if you have problems the morning of surgery: [phone]   Do not eat food or drink liquids :After Midnight.      Take these medicines the morning of surgery with A SIP OF WATER: Celexa, avalide, synthroid, diovan.   Do not wear jewelry, make-up or nail polish.  Do not wear lotions, powders, or perfumes. You may wear deodorant.  Do not shave 48 hours prior to surgery.  Do not bring valuables to the hospital.  Contacts, dentures or bridgework may not be worn into surgery.  Leave suitcase in the car. After surgery it may be brought to your room.  For patients admitted to the hospital, checkout time is 11:00 AM the day of discharge.   Patients discharged the day of surgery will not be allowed to drive home.  :     Please read over the following fact sheets that you were given: Coughing and Deep Breathing, Surgical Site Infection Prevention, Anesthesia Post-op Instructions and Care and Recovery After Surgery    Cataract A cataract is a clouding of the lens of the eye. When a lens becomes cloudy, vision is reduced based on the degree and nature of the clouding. Many cataracts reduce vision to some degree. Some cataracts make people more near-sighted as they develop. Other cataracts increase glare. Cataracts that are ignored and become worse can sometimes look white. The white color can be seen through the pupil. CAUSES   Aging. However, cataracts may occur at any age, even in newborns.   Certain drugs.   Trauma to the eye.   Certain diseases such as diabetes.   Specific eye diseases such as chronic inflammation inside the eye or a sudden attack of a rare form of glaucoma.   Inherited or acquired medical problems.  SYMPTOMS   Gradual, progressive drop in vision in the affected eye.   Severe, rapid visual loss. This most often happens when trauma is the cause.  DIAGNOSIS  To detect a  cataract, an eye doctor examines the lens. Cataracts are best diagnosed with an exam of the eyes with the pupils enlarged (dilated) by drops.  TREATMENT  For an early cataract, vision may improve by using different eyeglasses or stronger lighting. If that does not help your vision, surgery is the only effective treatment. A cataract needs to be surgically removed when vision loss interferes with your everyday activities, such as driving, reading, or watching TV. A cataract may also have to be removed if it prevents examination or treatment of another eye problem. Surgery removes the cloudy lens and usually replaces it with a substitute lens (intraocular lens, IOL).  At a time when both you and your doctor agree, the cataract will be surgically removed. If you have cataracts in both eyes, only one is usually removed at a time. This allows the operated eye to heal and be out of danger from any possible problems after surgery (such as infection or poor wound healing). In rare cases, a cataract may be doing damage to your eye. In these cases, your caregiver may advise surgical removal right away. The vast majority of people who have cataract surgery have better vision afterward. HOME CARE INSTRUCTIONS  If you are not planning surgery, you may be asked to do the following:  Use different eyeglasses.   Use stronger or brighter lighting.   Ask your eye doctor about reducing  your medicine dose or changing medicines if it is thought that a medicine caused your cataract. Changing medicines does not make the cataract go away on its own.   Become familiar with your surroundings. Poor vision can lead to injury. Avoid bumping into things on the affected side. You are at a higher risk for tripping or falling.   Exercise extreme care when driving or operating machinery.   Wear sunglasses if you are sensitive to bright light or experiencing problems with glare.  SEEK IMMEDIATE MEDICAL CARE IF:   You have a  worsening or sudden vision loss.   You notice redness, swelling, or increasing pain in the eye.   You have a fever.  Document Released: 11/10/2005 Document Revised: 10/30/2011 Document Reviewed: 07/04/2011 [facility] Patient Information 2012 [place].PATIENT INSTRUCTIONS POST-ANESTHESIA  IMMEDIATELY FOLLOWING SURGERY:  Do not drive or operate machinery for the first twenty four hours after surgery.  Do not make any important decisions for twenty four hours after surgery or while taking narcotic pain medications or sedatives.  If you develop intractable nausea and vomiting or a severe headache please notify your doctor immediately.  FOLLOW-UP:  Please make an appointment with your surgeon as instructed. You do not need to follow up with anesthesia unless specifically instructed to do so.  WOUND CARE INSTRUCTIONS (if applicable):  Keep a dry clean dressing on the anesthesia/puncture wound site if there is drainage.  Once the wound has quit draining you may leave it open to air.  Generally you should leave the bandage intact for twenty four hours unless there is drainage.  If the epidural site drains for more than 36-48 hours please call the anesthesia department.  QUESTIONS?:  Please feel free to call your physician or the hospital operator if you have any questions, and they will be happy to assist you.

## 2017-10-06 ENCOUNTER — Encounter (HOSPITAL_COMMUNITY): Payer: Self-pay

## 2017-10-06 ENCOUNTER — Other Ambulatory Visit: Payer: Self-pay

## 2017-10-06 ENCOUNTER — Encounter (HOSPITAL_COMMUNITY)
Admission: RE | Admit: 2017-10-06 | Discharge: 2017-10-06 | Disposition: A | Discharge status: Home / Self Care | Payer: Medicare Other | Source: Ambulatory Visit | Attending: Ophthalmology | Admitting: Ophthalmology

## 2017-10-06 DIAGNOSIS — F419 Anxiety disorder, unspecified: Secondary | ICD-10-CM | POA: Diagnosis not present

## 2017-10-06 DIAGNOSIS — E039 Hypothyroidism, unspecified: Secondary | ICD-10-CM | POA: Diagnosis not present

## 2017-10-06 DIAGNOSIS — M1990 Unspecified osteoarthritis, unspecified site: Secondary | ICD-10-CM | POA: Diagnosis not present

## 2017-10-06 DIAGNOSIS — F329 Major depressive disorder, single episode, unspecified: Secondary | ICD-10-CM | POA: Diagnosis not present

## 2017-10-06 DIAGNOSIS — H2511 Age-related nuclear cataract, right eye: Secondary | ICD-10-CM | POA: Diagnosis not present

## 2017-10-06 DIAGNOSIS — I10 Essential (primary) hypertension: Secondary | ICD-10-CM | POA: Diagnosis not present

## 2017-10-06 LAB — CBC WITH DIFFERENTIAL/PLATELET
BASOS PCT: 0 %
Basophils Absolute: 0 10*3/uL (ref 0.0–0.1)
EOS PCT: 1 %
Eosinophils Absolute: 0.1 10*3/uL (ref 0.0–0.7)
HEMATOCRIT: 45.1 % (ref 36.0–46.0)
Hemoglobin: 14.8 g/dL (ref 12.0–15.0)
LYMPHS PCT: 33 %
Lymphs Abs: 3.3 10*3/uL (ref 0.7–4.0)
MCH: 30.8 pg (ref 26.0–34.0)
MCHC: 32.8 g/dL (ref 30.0–36.0)
MCV: 94 fL (ref 78.0–100.0)
MONO ABS: 1 10*3/uL (ref 0.1–1.0)
MONOS PCT: 10 %
NEUTROS ABS: 5.7 10*3/uL (ref 1.7–7.7)
Neutrophils Relative %: 56 %
Platelets: 268 10*3/uL (ref 150–400)
RBC: 4.8 MIL/uL (ref 3.87–5.11)
RDW: 13.4 % (ref 11.5–15.5)
WBC: 10.1 10*3/uL (ref 4.0–10.5)

## 2017-10-06 LAB — BASIC METABOLIC PANEL
Anion gap: 8 (ref 5–15)
BUN: 13 mg/dL (ref 6–20)
CALCIUM: 9.8 mg/dL (ref 8.9–10.3)
CO2: 29 mmol/L (ref 22–32)
CREATININE: 0.78 mg/dL (ref 0.44–1.00)
Chloride: 103 mmol/L (ref 101–111)
GFR calc Af Amer: >60 mL/min (ref >60)
GFR calc non Af Amer: >60 mL/min (ref >60)
GLUCOSE: 101 mg/dL — AB (ref 65–99)
Potassium: 3.1 mmol/L — ABNORMAL LOW (ref 3.5–5.1)
Sodium: 140 mmol/L (ref 135–145)

## 2017-10-07 DIAGNOSIS — M170 Bilateral primary osteoarthritis of knee: Secondary | ICD-10-CM | POA: Diagnosis not present

## 2017-10-08 NOTE — Pre-Procedure Instructions (Signed)
Dr Patsey Berthold aware of 3.1 potassium. No orders given.

## 2017-10-09 ENCOUNTER — Encounter (HOSPITAL_COMMUNITY)
Admission: RE | Disposition: A | Discharge status: Home / Self Care | Payer: Self-pay | Source: Ambulatory Visit | Attending: Ophthalmology

## 2017-10-09 ENCOUNTER — Encounter (HOSPITAL_COMMUNITY): Payer: Self-pay | Admitting: *Deleted

## 2017-10-09 ENCOUNTER — Ambulatory Visit (HOSPITAL_COMMUNITY): Payer: Medicare Other | Admitting: Anesthesiology

## 2017-10-09 ENCOUNTER — Ambulatory Visit (HOSPITAL_COMMUNITY)
Admission: RE | Admit: 2017-10-09 | Discharge: 2017-10-09 | Disposition: A | Discharge status: Home / Self Care | Payer: Medicare Other | Source: Ambulatory Visit | Attending: Ophthalmology | Admitting: Ophthalmology

## 2017-10-09 DIAGNOSIS — H2511 Age-related nuclear cataract, right eye: Secondary | ICD-10-CM | POA: Diagnosis not present

## 2017-10-09 DIAGNOSIS — F419 Anxiety disorder, unspecified: Secondary | ICD-10-CM | POA: Diagnosis not present

## 2017-10-09 DIAGNOSIS — E039 Hypothyroidism, unspecified: Secondary | ICD-10-CM | POA: Insufficient documentation

## 2017-10-09 DIAGNOSIS — I10 Essential (primary) hypertension: Secondary | ICD-10-CM | POA: Diagnosis not present

## 2017-10-09 DIAGNOSIS — F329 Major depressive disorder, single episode, unspecified: Secondary | ICD-10-CM | POA: Insufficient documentation

## 2017-10-09 DIAGNOSIS — M1990 Unspecified osteoarthritis, unspecified site: Secondary | ICD-10-CM | POA: Diagnosis not present

## 2017-10-09 DIAGNOSIS — H25811 Combined forms of age-related cataract, right eye: Secondary | ICD-10-CM | POA: Diagnosis not present

## 2017-10-09 HISTORY — PX: CATARACT EXTRACTION W/PHACO: SHX586

## 2017-10-09 SURGERY — PHACOEMULSIFICATION, CATARACT, WITH IOL INSERTION
Anesthesia: Monitor Anesthesia Care | Site: Eye | Laterality: Right

## 2017-10-09 MED ORDER — LIDOCAINE HCL (PF) 1 % IJ SOLN
INTRAOCULAR | Status: DC | PRN
  Administered 2017-10-09: 1 mL via OPHTHALMIC

## 2017-10-09 MED ORDER — SODIUM HYALURONATE 23 MG/ML IO SOLN
INTRAOCULAR | Status: DC | PRN
  Administered 2017-10-09: 0.6 mL via INTRAOCULAR

## 2017-10-09 MED ORDER — PROVISC 10 MG/ML IO SOLN
INTRAOCULAR | Status: DC | PRN
  Administered 2017-10-09: 0.85 mL via INTRAOCULAR

## 2017-10-09 MED ORDER — NEOMYCIN-POLYMYXIN-DEXAMETH 3.5-10000-0.1 OP SUSP
OPHTHALMIC | Status: DC | PRN
  Administered 2017-10-09: 2 [drp] via OPHTHALMIC

## 2017-10-09 MED ORDER — BSS IO SOLN
INTRAOCULAR | Status: DC | PRN
  Administered 2017-10-09: 15 mL

## 2017-10-09 MED ORDER — MIDAZOLAM HCL 2 MG/2ML IJ SOLN
1.0000 mg | INTRAMUSCULAR | Status: AC
Start: 2017-10-09 — End: 2017-10-09
  Administered 2017-10-09: 2 mg via INTRAVENOUS
  Filled 2017-10-09: qty 2

## 2017-10-09 MED ORDER — FENTANYL CITRATE (PF) 100 MCG/2ML IJ SOLN
25.0000 ug | Freq: Once | INTRAMUSCULAR | Status: AC
  Administered 2017-10-09: 25 ug via INTRAVENOUS
  Filled 2017-10-09: qty 2

## 2017-10-09 MED ORDER — LACTATED RINGERS IV SOLN
INTRAVENOUS | Status: DC
  Administered 2017-10-09: 08:00:00 via INTRAVENOUS

## 2017-10-09 MED ORDER — POVIDONE-IODINE 5 % OP SOLN
OPHTHALMIC | Status: DC | PRN
  Administered 2017-10-09: 1 via OPHTHALMIC

## 2017-10-09 MED ORDER — BSS IO SOLN
INTRAOCULAR | Status: DC | PRN
  Administered 2017-10-09: 500 mL

## 2017-10-09 MED ORDER — TETRACAINE HCL 0.5 % OP SOLN
1.0000 [drp] | OPHTHALMIC | Status: AC
  Administered 2017-10-09 (×3): 1 [drp] via OPHTHALMIC

## 2017-10-09 MED ORDER — PHENYLEPHRINE HCL 2.5 % OP SOLN
1.0000 [drp] | OPHTHALMIC | Status: AC
  Administered 2017-10-09 (×3): 1 [drp] via OPHTHALMIC

## 2017-10-09 MED ORDER — CYCLOPENTOLATE-PHENYLEPHRINE 0.2-1 % OP SOLN
1.0000 [drp] | OPHTHALMIC | Status: AC
  Administered 2017-10-09 (×3): 1 [drp] via OPHTHALMIC

## 2017-10-09 MED ORDER — LIDOCAINE HCL 3.5 % OP GEL
1.0000 "application " | Freq: Once | OPHTHALMIC | Status: AC
  Administered 2017-10-09: 1 via OPHTHALMIC

## 2017-10-09 SURGICAL SUPPLY — 16 items
CLOTH BEACON ORANGE TIMEOUT ST (SAFETY) ×2 IMPLANT
EYE SHIELD UNIVERSAL CLEAR (GAUZE/BANDAGES/DRESSINGS) ×2 IMPLANT
GLOVE BIOGEL PI IND STRL 6.5 (GLOVE) ×1 IMPLANT
GLOVE BIOGEL PI IND STRL 7.0 (GLOVE) ×1 IMPLANT
GLOVE BIOGEL PI IND STRL 7.5 (GLOVE) ×1 IMPLANT
GLOVE BIOGEL PI INDICATOR 6.5 (GLOVE) ×1
GLOVE BIOGEL PI INDICATOR 7.0 (GLOVE) ×1
GLOVE BIOGEL PI INDICATOR 7.5 (GLOVE) ×1
LENS ALC ACRYL/TECN (Ophthalmic Related) ×2 IMPLANT
NEEDLE HYPO 18GX1.5 BLUNT FILL (NEEDLE) ×2 IMPLANT
PAD ARMBOARD 7.5X6 YLW CONV (MISCELLANEOUS) ×2 IMPLANT
SYR TB 1ML LL NO SAFETY (SYRINGE) ×2 IMPLANT
TAPE SURG TRANSPORE 1 IN (GAUZE/BANDAGES/DRESSINGS) ×1 IMPLANT
TAPE SURGICAL TRANSPORE 1 IN (GAUZE/BANDAGES/DRESSINGS) ×1
VISCOELASTIC ADDITIONAL (OPHTHALMIC RELATED) ×2 IMPLANT
WATER STERILE IRR 250ML POUR (IV SOLUTION) ×2 IMPLANT

## 2017-10-09 NOTE — Anesthesia Preprocedure Evaluation (Signed)
Anesthesia Evaluation  Patient identified by MRN, date of birth, ID band Patient awake    Reviewed: Allergy & Precautions, NPO status , Patient's Chart, lab work & pertinent test results  Airway Mallampati: II       Dental  (+) Teeth Intact   Pulmonary    breath sounds clear to auscultation       Cardiovascular hypertension, (-) Past MI  Rhythm:Regular Rate:Normal     Neuro/Psych PSYCHIATRIC DISORDERS Anxiety Depression    GI/Hepatic GERD (occasional , no meds)  ,  Endo/Other  Hypothyroidism   Renal/GU      Musculoskeletal  (+) Arthritis ,   Abdominal   Peds  Hematology   Anesthesia Other Findings   Reproductive/Obstetrics                             Anesthesia Physical Anesthesia Plan  ASA: II  Anesthesia Plan: MAC   Post-op Pain Management:    Induction: Intravenous  PONV Risk Score and Plan:   Airway Management Planned: Nasal Cannula  Additional Equipment:   Intra-op Plan:   Post-operative Plan:   Informed Consent: I have reviewed the patients History and Physical, chart, labs and discussed the procedure including the risks, benefits and alternatives for the proposed anesthesia with the patient or authorized representative who has indicated his/her understanding and acceptance.     Plan Discussed with:   Anesthesia Plan Comments:         Anesthesia Quick Evaluation

## 2017-10-09 NOTE — Transfer of Care (Signed)
Immediate Anesthesia Transfer of Care Note  Patient: [name]  Procedure(s) Performed: CATARACT EXTRACTION PHACO AND INTRAOCULAR LENS PLACEMENT ([facility]) (Right Eye)  Patient Location: Short Stay  Anesthesia Type:MAC  Level of Consciousness: awake and alert   Airway & Oxygen Therapy: Patient Spontanous Breathing  Post-op Assessment: Report given to RN  Post vital signs: Reviewed  Last Vitals:  Vitals:   10/09/17 0800 10/09/17 0805  BP: (!) 179/83 (!) 185/82  Resp: 16 16  Temp:    SpO2: 98% 98%    Last Pain:  Vitals:   10/09/17 0726  TempSrc: Oral         Complications: No apparent anesthesia complications

## 2017-10-09 NOTE — H&P (Signed)
The H and P was reviewed and updated. The patient was examined.  No changes were found after exam.  The surgical eye was marked.  

## 2017-10-09 NOTE — Anesthesia Postprocedure Evaluation (Signed)
Anesthesia Post Note  Patient: [name]  Procedure(s) Performed: CATARACT EXTRACTION PHACO AND INTRAOCULAR LENS PLACEMENT ([place]) (Right Eye)  Patient location during evaluation: Short Stay Anesthesia Type: MAC Level of consciousness: awake and alert and oriented Pain management: pain level controlled Vital Signs Assessment: post-procedure vital signs reviewed and stable Respiratory status: spontaneous breathing Cardiovascular status: blood pressure returned to baseline Postop Assessment: no apparent nausea or vomiting Anesthetic complications: no     Last Vitals:  Vitals:   10/09/17 0800 10/09/17 0805  BP: (!) 179/83 (!) 185/82  Resp: 16 16  Temp:    SpO2: 98% 98%    Last Pain:  Vitals:   10/09/17 0726  TempSrc: Oral                 Ebin Palazzi

## 2017-10-09 NOTE — Discharge Instructions (Signed)
Please discharge patient when stable, will follow up today with Dr. Wrzosek at the [facility] office immediately following discharge.  Leave shield in place until visit.  All paperwork with discharge instructions will be given at the office. ° ° °PATIENT INSTRUCTIONS °POST-ANESTHESIA ° °IMMEDIATELY FOLLOWING SURGERY:  Do not drive or operate machinery for the first twenty four hours after surgery.  Do not make any important decisions for twenty four hours after surgery or while taking narcotic pain medications or sedatives.  If you develop intractable nausea and vomiting or a severe headache please notify your doctor immediately. ° °FOLLOW-UP:  Please make an appointment with your surgeon as instructed. You do not need to follow up with anesthesia unless specifically instructed to do so. ° °WOUND CARE INSTRUCTIONS (if applicable):  Keep a dry clean dressing on the anesthesia/puncture wound site if there is drainage.  Once the wound has quit draining you may leave it open to air.  Generally you should leave the bandage intact for twenty four hours unless there is drainage.  If the epidural site drains for more than 36-48 hours please call the anesthesia department. ° °QUESTIONS?:  Please feel free to call your physician or the hospital operator if you have any questions, and they will be happy to assist you.    ° ° ° °

## 2017-10-09 NOTE — Op Note (Addendum)
Date of procedure: 10/09/17  Pre-operative diagnosis: Visually significant cataract, Right Eye  Post-operative diagnosis: Visually significant cataract, RIght Eye  Procedure: Removal of cataract via phacoemulsification and insertion of intra-ocular lens AMO PCB00  +16.0D into the capsular bag of the Right Eye  Attending surgeon: Gerda Diss. Deola Rewis, MD, MA  Anesthesia: MAC, Topical [place]  Complications: None  Estimated Blood Loss: <70m (minimal)  Specimens: None  Implants: As above  Indications:  Visually significant cataract, RIght Eye  Procedure:  The patient was seen and identified in the pre-operative area. The operative eye was identified and dilated.  The operative eye was marked.  Topical anesthesia was administered to the operative eye.     The patient was then to the operative suite and placed in the supine position.  A timeout was performed confirming the patient, procedure to be performed, and all other relevant information.   The patient's face was prepped and draped in the usual fashion for intra-ocular surgery.  A lid speculum was placed into the operative eye and the surgical microscope moved into place and focused.  A superotemporal paracentesis was created using a 20 gauge paracentesis blade.  Shugarcaine was injected into the anterior chamber.  Viscoelastic was injected into the anterior chamber.  A temporal clear-corneal main wound incision was created using a 2.469mmicrokeratome.  A continuous curvilinear capsulorrhexis was initiated using an irrigating cystitome and completed using capsulorrhexis forceps.  Hydrodissection and hydrodeliniation were performed.  Viscoelastic was injected into the anterior chamber.  A phacoemulsification handpiece and a chopper as a second instrument were used to remove the nucleus and epinucleus. The irrigation/aspiration handpiece was used to remove any remaining cortical material.   The capsular bag was reinflated with viscoelastic,  checked, and found to be intact.  The intraocular lens was inserted into the capsular bag and dialed into place using a Kuglen hook.  The irrigation/aspiration handpiece was used to remove any remaining viscoelastic.  The clear corneal wound and paracentesis wounds were then hydrated and checked with Weck-Cels to be watertight.  The lid-speculum and drape was removed, and the patient's face was cleaned with a wet and dry 4x4.  Maxitrol was instilled in the eye before a clear shield was taped over the eye. The patient was taken to the post-operative care unit in good condition, having tolerated the procedure well.  Post-Op Instructions: [facility] and will receive all other orders and instructions.

## 2017-10-12 ENCOUNTER — Encounter (HOSPITAL_COMMUNITY): Payer: Self-pay | Admitting: Ophthalmology

## 2017-10-12 DIAGNOSIS — I10 Essential (primary) hypertension: Secondary | ICD-10-CM | POA: Diagnosis not present

## 2017-10-12 DIAGNOSIS — Z6832 Body mass index (BMI) 32.0-32.9, adult: Secondary | ICD-10-CM | POA: Diagnosis not present

## 2017-10-12 DIAGNOSIS — Z299 Encounter for prophylactic measures, unspecified: Secondary | ICD-10-CM | POA: Diagnosis not present

## 2017-10-12 DIAGNOSIS — Z713 Dietary counseling and surveillance: Secondary | ICD-10-CM | POA: Diagnosis not present

## 2017-10-20 DIAGNOSIS — R350 Frequency of micturition: Secondary | ICD-10-CM | POA: Diagnosis not present

## 2017-10-20 DIAGNOSIS — Z299 Encounter for prophylactic measures, unspecified: Secondary | ICD-10-CM | POA: Diagnosis not present

## 2017-10-20 DIAGNOSIS — I10 Essential (primary) hypertension: Secondary | ICD-10-CM | POA: Diagnosis not present

## 2017-10-20 DIAGNOSIS — N390 Urinary tract infection, site not specified: Secondary | ICD-10-CM | POA: Diagnosis not present

## 2017-10-20 DIAGNOSIS — Z6832 Body mass index (BMI) 32.0-32.9, adult: Secondary | ICD-10-CM | POA: Diagnosis not present

## 2017-10-28 DIAGNOSIS — E7800 Pure hypercholesterolemia, unspecified: Secondary | ICD-10-CM | POA: Diagnosis not present

## 2017-10-28 DIAGNOSIS — I10 Essential (primary) hypertension: Secondary | ICD-10-CM | POA: Diagnosis not present

## 2017-10-28 DIAGNOSIS — M159 Polyosteoarthritis, unspecified: Secondary | ICD-10-CM | POA: Diagnosis not present

## 2017-12-03 DIAGNOSIS — H25812 Combined forms of age-related cataract, left eye: Secondary | ICD-10-CM | POA: Diagnosis not present

## 2017-12-09 ENCOUNTER — Encounter (HOSPITAL_COMMUNITY)
Admission: RE | Admit: 2017-12-09 | Discharge: 2017-12-09 | Disposition: A | Discharge status: Home / Self Care | Payer: Medicare Other | Source: Ambulatory Visit | Attending: Ophthalmology | Admitting: Ophthalmology

## 2017-12-09 ENCOUNTER — Encounter (HOSPITAL_COMMUNITY): Payer: Self-pay

## 2017-12-11 ENCOUNTER — Ambulatory Visit (HOSPITAL_COMMUNITY): Payer: Medicare Other | Admitting: Anesthesiology

## 2017-12-11 ENCOUNTER — Ambulatory Visit (HOSPITAL_COMMUNITY)
Admission: RE | Admit: 2017-12-11 | Discharge: 2017-12-11 | Disposition: A | Discharge status: Home / Self Care | Payer: Medicare Other | Source: Ambulatory Visit | Attending: Ophthalmology | Admitting: Ophthalmology

## 2017-12-11 ENCOUNTER — Other Ambulatory Visit: Payer: Self-pay

## 2017-12-11 ENCOUNTER — Encounter (HOSPITAL_COMMUNITY)
Admission: RE | Disposition: A | Discharge status: Home / Self Care | Payer: Self-pay | Source: Ambulatory Visit | Attending: Ophthalmology

## 2017-12-11 ENCOUNTER — Encounter (HOSPITAL_COMMUNITY): Payer: Self-pay

## 2017-12-11 DIAGNOSIS — H25812 Combined forms of age-related cataract, left eye: Secondary | ICD-10-CM | POA: Diagnosis not present

## 2017-12-11 DIAGNOSIS — F419 Anxiety disorder, unspecified: Secondary | ICD-10-CM | POA: Diagnosis not present

## 2017-12-11 DIAGNOSIS — Z880 Allergy status to penicillin: Secondary | ICD-10-CM | POA: Diagnosis not present

## 2017-12-11 DIAGNOSIS — H2512 Age-related nuclear cataract, left eye: Secondary | ICD-10-CM | POA: Diagnosis not present

## 2017-12-11 DIAGNOSIS — H268 Other specified cataract: Secondary | ICD-10-CM | POA: Diagnosis not present

## 2017-12-11 DIAGNOSIS — Z79899 Other long term (current) drug therapy: Secondary | ICD-10-CM | POA: Insufficient documentation

## 2017-12-11 DIAGNOSIS — Z882 Allergy status to sulfonamides status: Secondary | ICD-10-CM | POA: Diagnosis not present

## 2017-12-11 DIAGNOSIS — I10 Essential (primary) hypertension: Secondary | ICD-10-CM | POA: Insufficient documentation

## 2017-12-11 HISTORY — PX: CATARACT EXTRACTION W/PHACO: SHX586

## 2017-12-11 SURGERY — PHACOEMULSIFICATION, CATARACT, WITH IOL INSERTION
Anesthesia: Monitor Anesthesia Care | Site: Eye | Laterality: Left

## 2017-12-11 MED ORDER — EPINEPHRINE PF 1 MG/ML IJ SOLN
INTRAOCULAR | Status: DC | PRN
  Administered 2017-12-11: 500 mL

## 2017-12-11 MED ORDER — SODIUM HYALURONATE 23 MG/ML IO SOLN
INTRAOCULAR | Status: DC | PRN
  Administered 2017-12-11: 0.6 mL via INTRAOCULAR

## 2017-12-11 MED ORDER — LIDOCAINE HCL 3.5 % OP GEL
1.0000 "application " | Freq: Once | OPHTHALMIC | Status: AC
  Administered 2017-12-11: 1 via OPHTHALMIC

## 2017-12-11 MED ORDER — BSS IO SOLN
INTRAOCULAR | Status: DC | PRN
  Administered 2017-12-11: 15 mL

## 2017-12-11 MED ORDER — PHENYLEPHRINE HCL 2.5 % OP SOLN
1.0000 [drp] | OPHTHALMIC | Status: AC
  Administered 2017-12-11 (×3): 1 [drp] via OPHTHALMIC

## 2017-12-11 MED ORDER — FENTANYL CITRATE (PF) 100 MCG/2ML IJ SOLN
25.0000 ug | Freq: Once | INTRAMUSCULAR | Status: AC
  Administered 2017-12-11: 25 ug via INTRAVENOUS

## 2017-12-11 MED ORDER — TETRACAINE HCL 0.5 % OP SOLN
1.0000 [drp] | OPHTHALMIC | Status: AC
  Administered 2017-12-11 (×3): 1 [drp] via OPHTHALMIC

## 2017-12-11 MED ORDER — CYCLOPENTOLATE-PHENYLEPHRINE 0.2-1 % OP SOLN
1.0000 [drp] | OPHTHALMIC | Status: AC
  Administered 2017-12-11 (×3): 1 [drp] via OPHTHALMIC

## 2017-12-11 MED ORDER — MIDAZOLAM HCL 2 MG/2ML IJ SOLN
1.0000 mg | INTRAMUSCULAR | Status: AC
  Administered 2017-12-11: 2 mg via INTRAVENOUS

## 2017-12-11 MED ORDER — LACTATED RINGERS IV SOLN
INTRAVENOUS | Status: DC
  Administered 2017-12-11: 08:00:00 via INTRAVENOUS

## 2017-12-11 MED ORDER — MIDAZOLAM HCL 2 MG/2ML IJ SOLN
INTRAMUSCULAR | Status: AC
  Filled 2017-12-11: qty 2

## 2017-12-11 MED ORDER — POVIDONE-IODINE 5 % OP SOLN
OPHTHALMIC | Status: DC | PRN
  Administered 2017-12-11: 1 via OPHTHALMIC

## 2017-12-11 MED ORDER — PROVISC 10 MG/ML IO SOLN
INTRAOCULAR | Status: DC | PRN
  Administered 2017-12-11: 0.85 mL via INTRAOCULAR

## 2017-12-11 MED ORDER — NEOMYCIN-POLYMYXIN-DEXAMETH 3.5-10000-0.1 OP SUSP
OPHTHALMIC | Status: DC | PRN
  Administered 2017-12-11: 2 [drp] via OPHTHALMIC

## 2017-12-11 MED ORDER — FENTANYL CITRATE (PF) 100 MCG/2ML IJ SOLN
INTRAMUSCULAR | Status: AC
  Filled 2017-12-11: qty 2

## 2017-12-11 MED ORDER — EPINEPHRINE PF 1 MG/ML IJ SOLN
INTRAOCULAR | Status: DC | PRN
  Administered 2017-12-11: 1 mL via OPHTHALMIC

## 2017-12-11 SURGICAL SUPPLY — 13 items
CLOTH BEACON ORANGE TIMEOUT ST (SAFETY) ×2 IMPLANT
EYE SHIELD UNIVERSAL CLEAR (GAUZE/BANDAGES/DRESSINGS) ×2 IMPLANT
GLOVE BIOGEL PI IND STRL 6.5 (GLOVE) ×2 IMPLANT
GLOVE BIOGEL PI INDICATOR 6.5 (GLOVE) ×2
GLOVE SURG SS PI 7.5 STRL IVOR (GLOVE) ×2 IMPLANT
LENS ALC ACRYL/TECN (Ophthalmic Related) ×2 IMPLANT
NEEDLE HYPO 18GX1.5 BLUNT FILL (NEEDLE) ×2 IMPLANT
PAD ARMBOARD 7.5X6 YLW CONV (MISCELLANEOUS) ×2 IMPLANT
SYR TB 1ML LL NO SAFETY (SYRINGE) ×2 IMPLANT
TAPE SURG TRANSPORE 1 IN (GAUZE/BANDAGES/DRESSINGS) ×1 IMPLANT
TAPE SURGICAL TRANSPORE 1 IN (GAUZE/BANDAGES/DRESSINGS) ×1
VISCOELASTIC ADDITIONAL (OPHTHALMIC RELATED) ×2 IMPLANT
WATER STERILE IRR 250ML POUR (IV SOLUTION) ×2 IMPLANT

## 2017-12-11 NOTE — H&P (Signed)
The H and P was reviewed and updated. The patient was examined.  No changes were found after exam.  The surgical eye was marked.  

## 2017-12-11 NOTE — Op Note (Signed)
Date of procedure: 12/11/17  Pre-operative diagnosis: Visually significant cataract, Left Eye  Post-operative diagnosis: Visually significant cataract, Left Eye  Procedure: Removal of cataract via phacoemulsification and insertion of intra-ocular lens [place]  +15.0D into the capsular bag of the Left Eye  Attending surgeon: Gerda Diss. Ravneet Spilker, MD, MA  Anesthesia: MAC, Topical Akten  Complications: None  Estimated Blood Loss: <88m (minimal)  Specimens: None  Implants: As above  Indications:  Visually significant cataract, Left Eye  Procedure:  The patient was seen and identified in the pre-operative area. The operative eye was identified and dilated.  The operative eye was marked.  Topical anesthesia was administered to the operative eye.     The patient was then to the operative suite and placed in the supine position.  A timeout was performed confirming the patient, procedure to be performed, and all other relevant information.   The patient's face was prepped and draped in the usual fashion for intra-ocular surgery.  A lid speculum was placed into the operative eye and the surgical microscope moved into place and focused.  A superotemporal paracentesis was created using a 20 gauge paracentesis blade.  Shugarcaine was injected into the anterior chamber.  Viscoelastic was injected into the anterior chamber.  A temporal clear-corneal main wound incision was created using a 2.463mmicrokeratome.  A continuous curvilinear capsulorrhexis was initiated using an irrigating cystitome and completed using capsulorrhexis forceps.  Hydrodissection and hydrodeliniation were performed.  Viscoelastic was injected into the anterior chamber.  A phacoemulsification handpiece and a chopper as a second instrument were used to remove the nucleus and epinucleus. The irrigation/aspiration handpiece was used to remove any remaining cortical material.   The capsular bag was reinflated with  viscoelastic, checked, and found to be intact.  The intraocular lens was inserted into the capsular bag and dialed into place using a Kuglen hook.  The irrigation/aspiration handpiece was used to remove any remaining viscoelastic.  The clear corneal wound and paracentesis wounds were then hydrated and checked with Weck-Cels to be watertight.  The lid-speculum and drape was removed, and the patient's face was cleaned with a wet and dry 4x4.  Maxitrol was instilled in the eye before a clear shield was taped over the eye. The patient was taken to the post-operative care unit in good condition, having tolerated the procedure well.  Post-Op Instructions: [facility] and will receive all other orders and instructions.

## 2017-12-11 NOTE — Anesthesia Procedure Notes (Signed)
Procedure Name: MAC Date/Time: 12/11/2017 8:30 AM Performed by: Vista Deck, CRNA Pre-anesthesia Checklist: Patient identified, Emergency Drugs available, Suction available, Timeout performed and Patient being monitored Patient Re-evaluated:Patient Re-evaluated prior to induction Oxygen Delivery Method: Nasal Cannula

## 2017-12-11 NOTE — Transfer of Care (Signed)
Immediate Anesthesia Transfer of Care Note  Patient: [name]  Procedure(s) Performed: CATARACT EXTRACTION PHACO AND INTRAOCULAR LENS PLACEMENT (IOC) (Left Eye)  Patient Location: Short Stay  Anesthesia Type:MAC  Level of Consciousness: awake, alert  and patient cooperative  Airway & Oxygen Therapy: Patient Spontanous Breathing  Post-op Assessment: Report given to RN and Post -op Vital signs reviewed and stable  Post vital signs: Reviewed and stable  Last Vitals:  Vitals:   12/11/17 0800 12/11/17 0815  BP: (!) 128/59 (!) 115/54  Pulse:    Resp: 16 15  Temp:    SpO2: 96% 95%    Last Pain:  Vitals:   12/11/17 0724  TempSrc: Oral      Patients Stated Pain Goal: 9 (79/03/83 3383)  Complications: No apparent anesthesia complications

## 2017-12-11 NOTE — Discharge Instructions (Signed)
Please discharge patient when stable, will follow up today with Dr. Marisa Hua at the [facility] office immediately following discharge.  Leave shield in place until visit.  All paperwork with discharge instructions will be given at the office.   Cataract Surgery, Care After Refer to this sheet in the next few weeks. These instructions provide you with information about caring for yourself after your procedure. Your health care provider may also give you more specific instructions. Your treatment has been planned according to current medical practices, but problems sometimes occur. Call your health care provider if you have any problems or questions after your procedure. What can I expect after the procedure? After the procedure, it is common to have:  Itching.  Discomfort.  Fluid discharge.  Sensitivity to light and to touch.  Bruising.  Follow these instructions at home: [place] your eye every day for signs of infection. Watch for: ? Redness, swelling, or pain. ? Fluid, blood, or pus. ? Warmth. ? Bad smell. Activity  Avoid strenuous activities, such as playing contact sports, for as long as told by your health care provider.  Do not drive or operate heavy machinery until your health care provider approves.  Do not bend or lift heavy objects. Bending increases pressure in the eye. You can walk, climb stairs, and do light household chores.  Ask your health care provider when you can return to work. If you work in a dusty environment, you may be advised to wear protective eyewear for a period of time. General instructions  Take or apply over-the-counter and prescription medicines only as told by your health care provider. This includes eye drops.  Do not touch or rub your eyes.  If you were given a protective shield, wear it as told by your health care provider. If you were not given a protective shield, wear sunglasses as told by your health care provider to  protect your eyes.  Keep the area around your eye clean and dry. Avoid swimming or allowing water to hit you directly in the face while showering until told by your health care provider. Keep soap and shampoo out of your eyes.  Do not put a contact lens into the affected eye or eyes until your health care provider approves.  Keep all follow-up visits as told by your health care provider. This is important. Contact a health care provider if:   You have increased bruising around your eye.  You have pain that is not helped with medicine.  You have a fever.  You have redness, swelling, or pain in your eye.  You have fluid, blood, or pus coming from your incision.  Your vision gets worse. Get help right away if:  You have sudden vision loss. This information is not intended to replace advice given to you by your health care provider. Make sure you discuss any questions you have with your health care provider. Document Released: 05/30/2005 Document Revised: 03/20/2016 Document Reviewed: 09/20/2015 Elsevier Interactive Patient Education  2018 [place] POST-ANESTHESIA  IMMEDIATELY FOLLOWING SURGERY:  Do not drive or operate machinery for the first twenty four hours after surgery.  Do not make any important decisions for twenty four hours after surgery or while taking narcotic pain medications or sedatives.  If you develop intractable nausea and vomiting or a severe headache please notify your doctor immediately.  FOLLOW-UP:  Please make an appointment with your surgeon as instructed. You do not need to follow up with anesthesia  unless specifically instructed to do so.  WOUND CARE INSTRUCTIONS (if applicable):  Keep a dry clean dressing on the anesthesia/puncture wound site if there is drainage.  Once the wound has quit draining you may leave it open to air.  Generally you should leave the bandage intact for twenty four hours unless there is drainage.  If the  epidural site drains for more than 36-48 hours please call the anesthesia department.  QUESTIONS?:  Please feel free to call your physician or the hospital operator if you have any questions, and they will be happy to assist you.

## 2017-12-11 NOTE — Anesthesia Preprocedure Evaluation (Signed)
Anesthesia Evaluation  Patient identified by MRN, date of birth, ID band Patient awake    Reviewed: Allergy & Precautions, NPO status , Patient's Chart, lab work & pertinent test results  Airway Mallampati: II       Dental  (+) Teeth Intact   Pulmonary    breath sounds clear to auscultation       Cardiovascular hypertension, (-) Past MI  Rhythm:Regular Rate:Normal     Neuro/Psych PSYCHIATRIC DISORDERS Anxiety Depression    GI/Hepatic GERD (occasional , no meds)  ,  Endo/Other  Hypothyroidism   Renal/GU      Musculoskeletal  (+) Arthritis ,   Abdominal   Peds  Hematology   Anesthesia Other Findings   Reproductive/Obstetrics                             Anesthesia Physical Anesthesia Plan  ASA: II  Anesthesia Plan: MAC   Post-op Pain Management:    Induction: Intravenous  PONV Risk Score and Plan:   Airway Management Planned: Nasal Cannula  Additional Equipment:   Intra-op Plan:   Post-operative Plan:   Informed Consent: I have reviewed the patients History and Physical, chart, labs and discussed the procedure including the risks, benefits and alternatives for the proposed anesthesia with the patient or authorized representative who has indicated his/her understanding and acceptance.     Plan Discussed with:   Anesthesia Plan Comments:         Anesthesia Quick Evaluation

## 2017-12-11 NOTE — Anesthesia Postprocedure Evaluation (Signed)
Anesthesia Post Note  Patient: [name]  Procedure(s) Performed: CATARACT EXTRACTION PHACO AND INTRAOCULAR LENS PLACEMENT (IOC) (Left Eye)  Patient location during evaluation: Short Stay Anesthesia Type: MAC Level of consciousness: awake and alert and patient cooperative Pain management: satisfactory to patient Vital Signs Assessment: post-procedure vital signs reviewed and stable Respiratory status: spontaneous breathing Cardiovascular status: stable Postop Assessment: no apparent nausea or vomiting Anesthetic complications: no     Last Vitals:  Vitals:   12/11/17 0815 12/11/17 0853  BP: (!) 115/54 (!) 146/70  Pulse:    Resp: 15 18  Temp:  36.6 C  SpO2: 95% 99%    Last Pain:  Vitals:   12/11/17 0853  TempSrc: Oral                 Alli Jasmer

## 2017-12-14 ENCOUNTER — Encounter (HOSPITAL_COMMUNITY): Payer: Self-pay | Admitting: Ophthalmology

## 2017-12-15 DIAGNOSIS — E7800 Pure hypercholesterolemia, unspecified: Secondary | ICD-10-CM | POA: Diagnosis not present

## 2017-12-15 DIAGNOSIS — M159 Polyosteoarthritis, unspecified: Secondary | ICD-10-CM | POA: Diagnosis not present

## 2017-12-15 DIAGNOSIS — I10 Essential (primary) hypertension: Secondary | ICD-10-CM | POA: Diagnosis not present

## 2017-12-21 DIAGNOSIS — Z1331 Encounter for screening for depression: Secondary | ICD-10-CM | POA: Diagnosis not present

## 2017-12-21 DIAGNOSIS — R5383 Other fatigue: Secondary | ICD-10-CM | POA: Diagnosis not present

## 2017-12-21 DIAGNOSIS — Z6832 Body mass index (BMI) 32.0-32.9, adult: Secondary | ICD-10-CM | POA: Diagnosis not present

## 2017-12-21 DIAGNOSIS — Z299 Encounter for prophylactic measures, unspecified: Secondary | ICD-10-CM | POA: Diagnosis not present

## 2017-12-21 DIAGNOSIS — Z0000 Encounter for general adult medical examination without abnormal findings: Secondary | ICD-10-CM | POA: Diagnosis not present

## 2017-12-21 DIAGNOSIS — Z1211 Encounter for screening for malignant neoplasm of colon: Secondary | ICD-10-CM | POA: Diagnosis not present

## 2017-12-21 DIAGNOSIS — E7800 Pure hypercholesterolemia, unspecified: Secondary | ICD-10-CM | POA: Diagnosis not present

## 2017-12-21 DIAGNOSIS — Z7189 Other specified counseling: Secondary | ICD-10-CM | POA: Diagnosis not present

## 2017-12-21 DIAGNOSIS — I10 Essential (primary) hypertension: Secondary | ICD-10-CM | POA: Diagnosis not present

## 2017-12-21 DIAGNOSIS — Z1339 Encounter for screening examination for other mental health and behavioral disorders: Secondary | ICD-10-CM | POA: Diagnosis not present

## 2017-12-21 DIAGNOSIS — E039 Hypothyroidism, unspecified: Secondary | ICD-10-CM | POA: Diagnosis not present

## 2017-12-21 DIAGNOSIS — Z79899 Other long term (current) drug therapy: Secondary | ICD-10-CM | POA: Diagnosis not present

## 2018-01-04 DIAGNOSIS — I10 Essential (primary) hypertension: Secondary | ICD-10-CM | POA: Diagnosis not present

## 2018-01-04 DIAGNOSIS — Z299 Encounter for prophylactic measures, unspecified: Secondary | ICD-10-CM | POA: Diagnosis not present

## 2018-01-04 DIAGNOSIS — L821 Other seborrheic keratosis: Secondary | ICD-10-CM | POA: Diagnosis not present

## 2018-01-04 DIAGNOSIS — Q828 Other specified congenital malformations of skin: Secondary | ICD-10-CM | POA: Diagnosis not present

## 2018-01-04 DIAGNOSIS — L565 Disseminated superficial actinic porokeratosis (DSAP): Secondary | ICD-10-CM | POA: Diagnosis not present

## 2018-01-04 DIAGNOSIS — Z6832 Body mass index (BMI) 32.0-32.9, adult: Secondary | ICD-10-CM | POA: Diagnosis not present

## 2018-01-28 DIAGNOSIS — Z96612 Presence of left artificial shoulder joint: Secondary | ICD-10-CM | POA: Diagnosis not present

## 2018-01-28 DIAGNOSIS — M25512 Pain in left shoulder: Secondary | ICD-10-CM | POA: Diagnosis not present

## 2018-02-02 DIAGNOSIS — Z299 Encounter for prophylactic measures, unspecified: Secondary | ICD-10-CM | POA: Diagnosis not present

## 2018-02-02 DIAGNOSIS — E039 Hypothyroidism, unspecified: Secondary | ICD-10-CM | POA: Diagnosis not present

## 2018-02-02 DIAGNOSIS — I10 Essential (primary) hypertension: Secondary | ICD-10-CM | POA: Diagnosis not present

## 2018-02-02 DIAGNOSIS — E7800 Pure hypercholesterolemia, unspecified: Secondary | ICD-10-CM | POA: Diagnosis not present

## 2018-02-02 DIAGNOSIS — Z6832 Body mass index (BMI) 32.0-32.9, adult: Secondary | ICD-10-CM | POA: Diagnosis not present

## 2018-03-04 DIAGNOSIS — M1711 Unilateral primary osteoarthritis, right knee: Secondary | ICD-10-CM | POA: Diagnosis not present

## 2018-03-04 DIAGNOSIS — M1712 Unilateral primary osteoarthritis, left knee: Secondary | ICD-10-CM | POA: Diagnosis not present

## 2018-03-10 DIAGNOSIS — I10 Essential (primary) hypertension: Secondary | ICD-10-CM | POA: Diagnosis not present

## 2018-03-10 DIAGNOSIS — Z6832 Body mass index (BMI) 32.0-32.9, adult: Secondary | ICD-10-CM | POA: Diagnosis not present

## 2018-03-10 DIAGNOSIS — J069 Acute upper respiratory infection, unspecified: Secondary | ICD-10-CM | POA: Diagnosis not present

## 2018-03-10 DIAGNOSIS — Z713 Dietary counseling and surveillance: Secondary | ICD-10-CM | POA: Diagnosis not present

## 2018-03-10 DIAGNOSIS — Z299 Encounter for prophylactic measures, unspecified: Secondary | ICD-10-CM | POA: Diagnosis not present

## 2018-03-23 DIAGNOSIS — I10 Essential (primary) hypertension: Secondary | ICD-10-CM | POA: Diagnosis not present

## 2018-03-23 DIAGNOSIS — Z6832 Body mass index (BMI) 32.0-32.9, adult: Secondary | ICD-10-CM | POA: Diagnosis not present

## 2018-03-23 DIAGNOSIS — J329 Chronic sinusitis, unspecified: Secondary | ICD-10-CM | POA: Diagnosis not present

## 2018-03-23 DIAGNOSIS — Z299 Encounter for prophylactic measures, unspecified: Secondary | ICD-10-CM | POA: Diagnosis not present

## 2018-03-23 DIAGNOSIS — Z789 Other specified health status: Secondary | ICD-10-CM | POA: Diagnosis not present

## 2018-04-08 DIAGNOSIS — M159 Polyosteoarthritis, unspecified: Secondary | ICD-10-CM | POA: Diagnosis not present

## 2018-04-08 DIAGNOSIS — E7800 Pure hypercholesterolemia, unspecified: Secondary | ICD-10-CM | POA: Diagnosis not present

## 2018-04-08 DIAGNOSIS — I10 Essential (primary) hypertension: Secondary | ICD-10-CM | POA: Diagnosis not present

## 2018-05-04 DIAGNOSIS — Z299 Encounter for prophylactic measures, unspecified: Secondary | ICD-10-CM | POA: Diagnosis not present

## 2018-05-04 DIAGNOSIS — R21 Rash and other nonspecific skin eruption: Secondary | ICD-10-CM | POA: Diagnosis not present

## 2018-05-04 DIAGNOSIS — Z6832 Body mass index (BMI) 32.0-32.9, adult: Secondary | ICD-10-CM | POA: Diagnosis not present

## 2018-05-04 DIAGNOSIS — I10 Essential (primary) hypertension: Secondary | ICD-10-CM | POA: Diagnosis not present

## 2018-05-04 DIAGNOSIS — J310 Chronic rhinitis: Secondary | ICD-10-CM | POA: Diagnosis not present

## 2018-05-09 IMAGING — CR DG SHOULDER 1V*L*
1 series · 1 of 1 positions shown · non-contrast
Comparison: 09/21/2016

CLINICAL DATA: Shoulder replaced

EXAM:
LEFT SHOULDER - 1 VIEW

[AP]
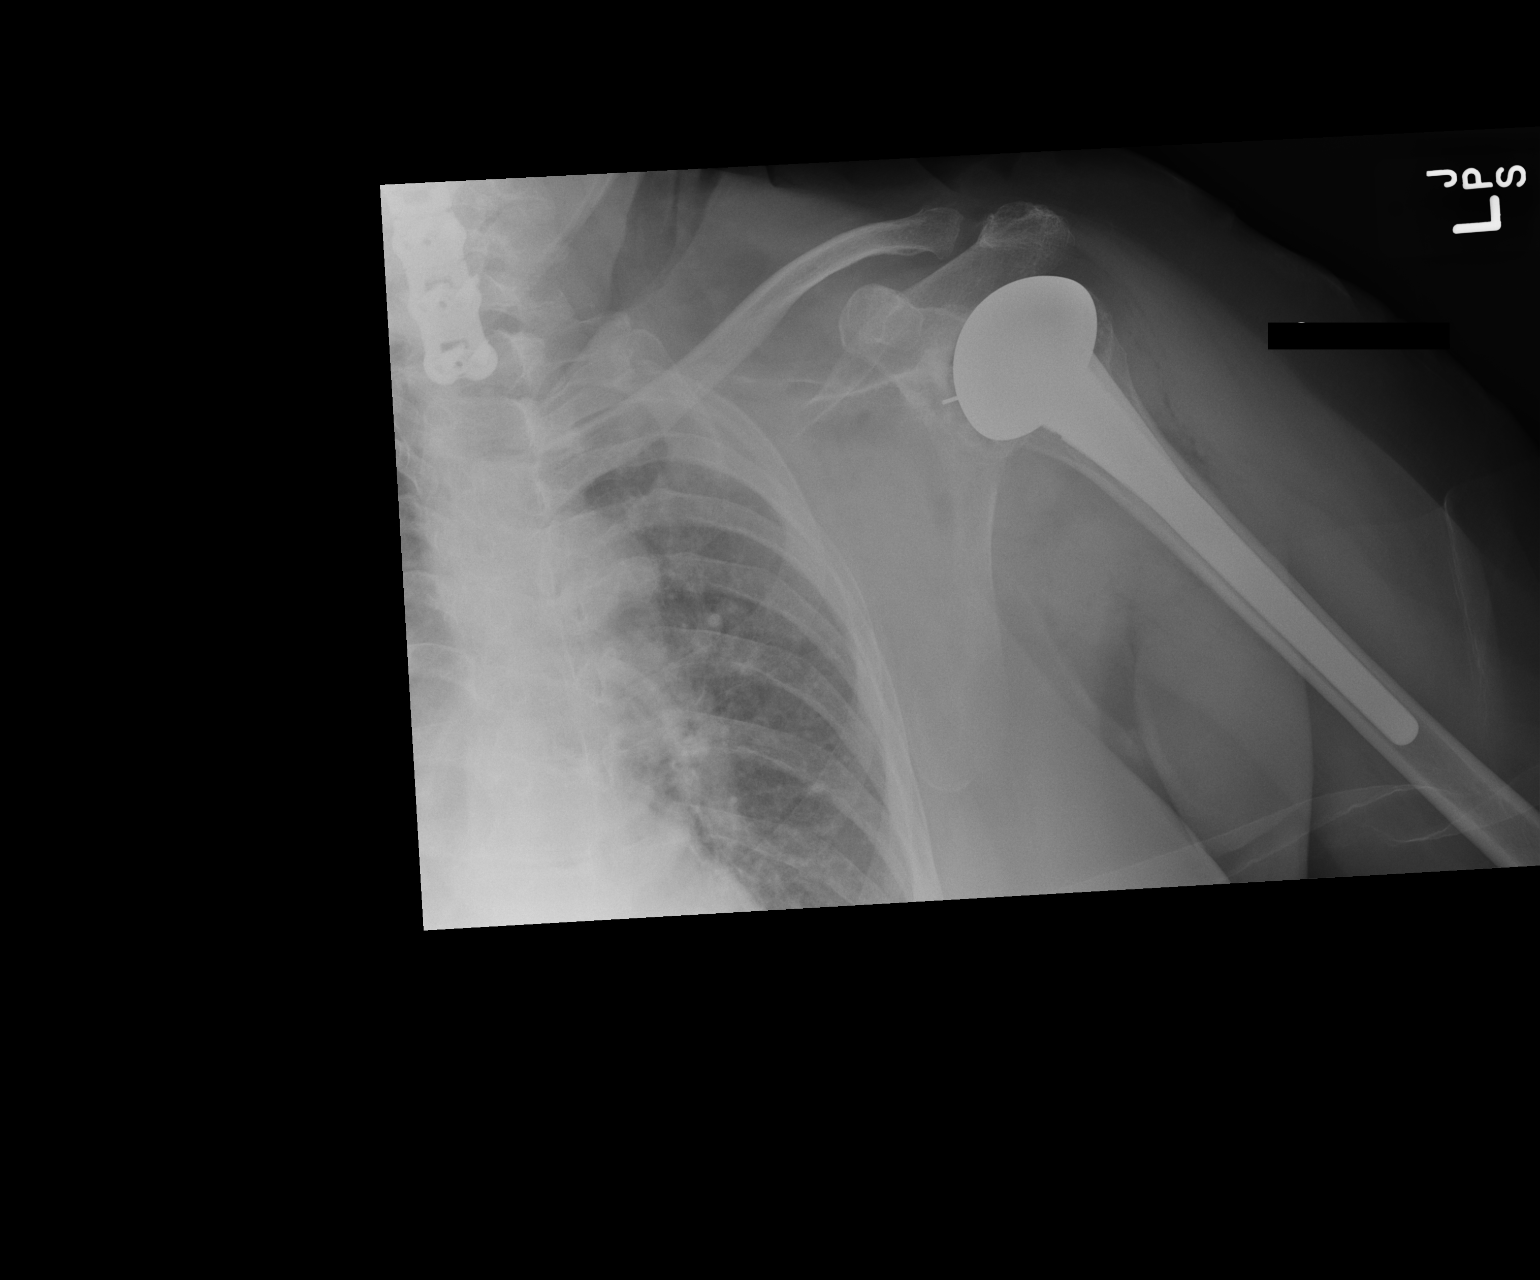

[1 of 1 positions shown; findings below may reference images not displayed]

FINDINGS: Left shoulder total arthroplasty has been placed anatomic alignment.
No breakage or loosening of the hardware.
IMPRESSION: Left shoulder total hip arthroplasty placed anatomically aligned.

## 2018-06-03 DIAGNOSIS — Z961 Presence of intraocular lens: Secondary | ICD-10-CM | POA: Diagnosis not present

## 2018-06-16 DIAGNOSIS — Z6833 Body mass index (BMI) 33.0-33.9, adult: Secondary | ICD-10-CM | POA: Diagnosis not present

## 2018-06-16 DIAGNOSIS — Z713 Dietary counseling and surveillance: Secondary | ICD-10-CM | POA: Diagnosis not present

## 2018-06-16 DIAGNOSIS — N390 Urinary tract infection, site not specified: Secondary | ICD-10-CM | POA: Diagnosis not present

## 2018-06-16 DIAGNOSIS — Z299 Encounter for prophylactic measures, unspecified: Secondary | ICD-10-CM | POA: Diagnosis not present

## 2018-06-16 DIAGNOSIS — I10 Essential (primary) hypertension: Secondary | ICD-10-CM | POA: Diagnosis not present

## 2018-07-01 DIAGNOSIS — M1711 Unilateral primary osteoarthritis, right knee: Secondary | ICD-10-CM | POA: Diagnosis not present

## 2018-07-01 DIAGNOSIS — M1712 Unilateral primary osteoarthritis, left knee: Secondary | ICD-10-CM | POA: Diagnosis not present

## 2018-07-21 DIAGNOSIS — M159 Polyosteoarthritis, unspecified: Secondary | ICD-10-CM | POA: Diagnosis not present

## 2018-07-21 DIAGNOSIS — E7800 Pure hypercholesterolemia, unspecified: Secondary | ICD-10-CM | POA: Diagnosis not present

## 2018-07-21 DIAGNOSIS — I10 Essential (primary) hypertension: Secondary | ICD-10-CM | POA: Diagnosis not present

## 2018-08-29 DIAGNOSIS — Z23 Encounter for immunization: Secondary | ICD-10-CM | POA: Diagnosis not present

## 2018-09-02 DIAGNOSIS — M1712 Unilateral primary osteoarthritis, left knee: Secondary | ICD-10-CM | POA: Diagnosis not present

## 2018-09-02 DIAGNOSIS — M1711 Unilateral primary osteoarthritis, right knee: Secondary | ICD-10-CM | POA: Diagnosis not present

## 2018-09-03 DIAGNOSIS — I10 Essential (primary) hypertension: Secondary | ICD-10-CM | POA: Diagnosis not present

## 2018-09-03 DIAGNOSIS — F419 Anxiety disorder, unspecified: Secondary | ICD-10-CM | POA: Diagnosis not present

## 2018-09-03 DIAGNOSIS — Z299 Encounter for prophylactic measures, unspecified: Secondary | ICD-10-CM | POA: Diagnosis not present

## 2018-09-03 DIAGNOSIS — J019 Acute sinusitis, unspecified: Secondary | ICD-10-CM | POA: Diagnosis not present

## 2018-09-03 DIAGNOSIS — Z6833 Body mass index (BMI) 33.0-33.9, adult: Secondary | ICD-10-CM | POA: Diagnosis not present

## 2018-09-06 DIAGNOSIS — Z6833 Body mass index (BMI) 33.0-33.9, adult: Secondary | ICD-10-CM | POA: Diagnosis not present

## 2018-09-06 DIAGNOSIS — I10 Essential (primary) hypertension: Secondary | ICD-10-CM | POA: Diagnosis not present

## 2018-09-06 DIAGNOSIS — Z713 Dietary counseling and surveillance: Secondary | ICD-10-CM | POA: Diagnosis not present

## 2018-09-06 DIAGNOSIS — Z299 Encounter for prophylactic measures, unspecified: Secondary | ICD-10-CM | POA: Diagnosis not present

## 2018-09-08 DIAGNOSIS — Z6833 Body mass index (BMI) 33.0-33.9, adult: Secondary | ICD-10-CM | POA: Diagnosis not present

## 2018-09-08 DIAGNOSIS — I10 Essential (primary) hypertension: Secondary | ICD-10-CM | POA: Diagnosis not present

## 2018-09-08 DIAGNOSIS — E7800 Pure hypercholesterolemia, unspecified: Secondary | ICD-10-CM | POA: Diagnosis not present

## 2018-09-08 DIAGNOSIS — Z713 Dietary counseling and surveillance: Secondary | ICD-10-CM | POA: Diagnosis not present

## 2018-09-08 DIAGNOSIS — Z299 Encounter for prophylactic measures, unspecified: Secondary | ICD-10-CM | POA: Diagnosis not present

## 2018-09-15 DIAGNOSIS — Z6832 Body mass index (BMI) 32.0-32.9, adult: Secondary | ICD-10-CM | POA: Diagnosis not present

## 2018-09-15 DIAGNOSIS — Z299 Encounter for prophylactic measures, unspecified: Secondary | ICD-10-CM | POA: Diagnosis not present

## 2018-09-15 DIAGNOSIS — I10 Essential (primary) hypertension: Secondary | ICD-10-CM | POA: Diagnosis not present

## 2018-09-15 DIAGNOSIS — Z713 Dietary counseling and surveillance: Secondary | ICD-10-CM | POA: Diagnosis not present

## 2018-09-15 DIAGNOSIS — R5383 Other fatigue: Secondary | ICD-10-CM | POA: Diagnosis not present

## 2018-09-23 DIAGNOSIS — N390 Urinary tract infection, site not specified: Secondary | ICD-10-CM | POA: Diagnosis not present

## 2018-09-23 DIAGNOSIS — I10 Essential (primary) hypertension: Secondary | ICD-10-CM | POA: Diagnosis not present

## 2018-09-23 DIAGNOSIS — Z1231 Encounter for screening mammogram for malignant neoplasm of breast: Secondary | ICD-10-CM | POA: Diagnosis not present

## 2018-09-23 DIAGNOSIS — Z299 Encounter for prophylactic measures, unspecified: Secondary | ICD-10-CM | POA: Diagnosis not present

## 2018-09-23 DIAGNOSIS — R350 Frequency of micturition: Secondary | ICD-10-CM | POA: Diagnosis not present

## 2018-09-23 DIAGNOSIS — Z6832 Body mass index (BMI) 32.0-32.9, adult: Secondary | ICD-10-CM | POA: Diagnosis not present

## 2018-09-23 DIAGNOSIS — N824 Other female intestinal-genital tract fistulae: Secondary | ICD-10-CM | POA: Diagnosis not present

## 2018-10-05 DIAGNOSIS — N952 Postmenopausal atrophic vaginitis: Secondary | ICD-10-CM | POA: Diagnosis not present

## 2018-10-05 DIAGNOSIS — L988 Other specified disorders of the skin and subcutaneous tissue: Secondary | ICD-10-CM | POA: Diagnosis not present

## 2018-10-07 DIAGNOSIS — M1711 Unilateral primary osteoarthritis, right knee: Secondary | ICD-10-CM | POA: Diagnosis not present

## 2018-10-07 DIAGNOSIS — M1712 Unilateral primary osteoarthritis, left knee: Secondary | ICD-10-CM | POA: Diagnosis not present

## 2018-10-14 DIAGNOSIS — M1711 Unilateral primary osteoarthritis, right knee: Secondary | ICD-10-CM | POA: Diagnosis not present

## 2018-10-14 DIAGNOSIS — M1712 Unilateral primary osteoarthritis, left knee: Secondary | ICD-10-CM | POA: Diagnosis not present

## 2018-10-20 DIAGNOSIS — M1712 Unilateral primary osteoarthritis, left knee: Secondary | ICD-10-CM | POA: Diagnosis not present

## 2018-10-20 DIAGNOSIS — M1711 Unilateral primary osteoarthritis, right knee: Secondary | ICD-10-CM | POA: Diagnosis not present

## 2018-10-26 DIAGNOSIS — Z961 Presence of intraocular lens: Secondary | ICD-10-CM | POA: Diagnosis not present

## 2018-10-26 DIAGNOSIS — H02831 Dermatochalasis of right upper eyelid: Secondary | ICD-10-CM | POA: Diagnosis not present

## 2018-10-26 DIAGNOSIS — H02889 Meibomian gland dysfunction of unspecified eye, unspecified eyelid: Secondary | ICD-10-CM | POA: Diagnosis not present

## 2018-10-26 DIAGNOSIS — H04203 Unspecified epiphora, bilateral lacrimal glands: Secondary | ICD-10-CM | POA: Diagnosis not present

## 2018-10-26 DIAGNOSIS — H04552 Acquired stenosis of left nasolacrimal duct: Secondary | ICD-10-CM | POA: Diagnosis not present

## 2018-11-08 DIAGNOSIS — H02106 Unspecified ectropion of left eye, unspecified eyelid: Secondary | ICD-10-CM | POA: Diagnosis not present

## 2018-11-08 DIAGNOSIS — H04212 Epiphora due to excess lacrimation, left lacrimal gland: Secondary | ICD-10-CM | POA: Diagnosis not present

## 2018-11-13 ENCOUNTER — Emergency Department (HOSPITAL_COMMUNITY): Payer: Medicare Other

## 2018-11-13 ENCOUNTER — Encounter (HOSPITAL_COMMUNITY): Payer: Self-pay | Admitting: Emergency Medicine

## 2018-11-13 ENCOUNTER — Other Ambulatory Visit: Payer: Self-pay

## 2018-11-13 ENCOUNTER — Observation Stay (HOSPITAL_COMMUNITY)
Admission: EM | Admit: 2018-11-13 | Discharge: 2018-11-15 | Disposition: A | Discharge status: Home / Self Care | Payer: Medicare Other | Attending: Internal Medicine | Admitting: Internal Medicine

## 2018-11-13 DIAGNOSIS — E782 Mixed hyperlipidemia: Secondary | ICD-10-CM | POA: Insufficient documentation

## 2018-11-13 DIAGNOSIS — Z885 Allergy status to narcotic agent status: Secondary | ICD-10-CM | POA: Insufficient documentation

## 2018-11-13 DIAGNOSIS — I10 Essential (primary) hypertension: Secondary | ICD-10-CM | POA: Diagnosis not present

## 2018-11-13 DIAGNOSIS — Z882 Allergy status to sulfonamides status: Secondary | ICD-10-CM | POA: Diagnosis not present

## 2018-11-13 DIAGNOSIS — E039 Hypothyroidism, unspecified: Secondary | ICD-10-CM | POA: Diagnosis not present

## 2018-11-13 DIAGNOSIS — Z79899 Other long term (current) drug therapy: Secondary | ICD-10-CM | POA: Insufficient documentation

## 2018-11-13 DIAGNOSIS — Z888 Allergy status to other drugs, medicaments and biological substances status: Secondary | ICD-10-CM | POA: Insufficient documentation

## 2018-11-13 DIAGNOSIS — F419 Anxiety disorder, unspecified: Secondary | ICD-10-CM | POA: Insufficient documentation

## 2018-11-13 DIAGNOSIS — F329 Major depressive disorder, single episode, unspecified: Secondary | ICD-10-CM | POA: Insufficient documentation

## 2018-11-13 DIAGNOSIS — I4891 Unspecified atrial fibrillation: Secondary | ICD-10-CM | POA: Diagnosis not present

## 2018-11-13 DIAGNOSIS — Z7901 Long term (current) use of anticoagulants: Secondary | ICD-10-CM | POA: Diagnosis not present

## 2018-11-13 DIAGNOSIS — Z880 Allergy status to penicillin: Secondary | ICD-10-CM | POA: Diagnosis not present

## 2018-11-13 DIAGNOSIS — K219 Gastro-esophageal reflux disease without esophagitis: Secondary | ICD-10-CM | POA: Diagnosis not present

## 2018-11-13 DIAGNOSIS — E7800 Pure hypercholesterolemia, unspecified: Secondary | ICD-10-CM | POA: Diagnosis not present

## 2018-11-13 DIAGNOSIS — I480 Paroxysmal atrial fibrillation: Secondary | ICD-10-CM | POA: Diagnosis not present

## 2018-11-13 DIAGNOSIS — E876 Hypokalemia: Secondary | ICD-10-CM | POA: Diagnosis present

## 2018-11-13 DIAGNOSIS — R079 Chest pain, unspecified: Secondary | ICD-10-CM | POA: Diagnosis not present

## 2018-11-13 HISTORY — DX: Hypothyroidism, unspecified: E03.9

## 2018-11-13 HISTORY — DX: Hyperlipidemia, unspecified: E78.5

## 2018-11-13 LAB — CBC WITH DIFFERENTIAL/PLATELET
Abs Immature Granulocytes: 0.02 10*3/uL (ref 0.00–0.07)
BASOS ABS: 0 10*3/uL (ref 0.0–0.1)
BASOS PCT: 0 %
EOS PCT: 1 %
Eosinophils Absolute: 0.1 10*3/uL (ref 0.0–0.5)
HCT: 45.7 % (ref 36.0–46.0)
HEMOGLOBIN: 14.6 g/dL (ref 12.0–15.0)
Immature Granulocytes: 0 %
Lymphocytes Relative: 26 %
Lymphs Abs: 2.4 10*3/uL (ref 0.7–4.0)
MCH: 29.9 pg (ref 26.0–34.0)
MCHC: 31.9 g/dL (ref 30.0–36.0)
MCV: 93.5 fL (ref 80.0–100.0)
Monocytes Absolute: 1.1 10*3/uL — ABNORMAL HIGH (ref 0.1–1.0)
Monocytes Relative: 12 %
NRBC: 0 % (ref 0.0–0.2)
Neutro Abs: 5.8 10*3/uL (ref 1.7–7.7)
Neutrophils Relative %: 61 %
PLATELETS: 247 10*3/uL (ref 150–400)
RBC: 4.89 MIL/uL (ref 3.87–5.11)
RDW: 13.7 % (ref 11.5–15.5)
WBC: 9.5 10*3/uL (ref 4.0–10.5)

## 2018-11-13 LAB — COMPREHENSIVE METABOLIC PANEL
ALBUMIN: 3.9 g/dL (ref 3.5–5.0)
ALK PHOS: 69 U/L (ref 38–126)
ALT: 28 U/L (ref 0–44)
ANION GAP: 8 (ref 5–15)
AST: 36 U/L (ref 15–41)
BUN: 18 mg/dL (ref 8–23)
CALCIUM: 9.8 mg/dL (ref 8.9–10.3)
CHLORIDE: 104 mmol/L (ref 98–111)
CO2: 28 mmol/L (ref 22–32)
Creatinine, Ser: 0.75 mg/dL (ref 0.44–1.00)
GFR calc non Af Amer: >60 mL/min (ref >60)
GLUCOSE: 120 mg/dL — AB (ref 70–99)
Potassium: 2.9 mmol/L — ABNORMAL LOW (ref 3.5–5.1)
SODIUM: 140 mmol/L (ref 135–145)
Total Bilirubin: 0.8 mg/dL (ref 0.3–1.2)
Total Protein: 6.7 g/dL (ref 6.5–8.1)

## 2018-11-13 LAB — TROPONIN I
Troponin I: <0.03 ng/mL (ref <0.03)
Troponin I: 0.03 ng/mL (ref <0.03)
Troponin I: 0.04 ng/mL (ref <0.03)

## 2018-11-13 LAB — I-STAT TROPONIN, ED: Troponin i, poc: 0.01 ng/mL (ref 0.00–0.08)

## 2018-11-13 LAB — MAGNESIUM: Magnesium: 2.2 mg/dL (ref 1.7–2.4)

## 2018-11-13 LAB — TSH: TSH: 1.081 u[IU]/mL (ref 0.350–4.500)

## 2018-11-13 MED ORDER — DILTIAZEM HCL 30 MG PO TABS
30.0000 mg | ORAL_TABLET | Freq: Four times a day (QID) | ORAL | Status: DC
  Administered 2018-11-13 – 2018-11-14 (×3): 30 mg via ORAL
  Filled 2018-11-13 (×3): qty 1

## 2018-11-13 MED ORDER — POTASSIUM CHLORIDE 10 MEQ/100ML IV SOLN
10.0000 meq | Freq: Once | INTRAVENOUS | Status: AC
  Administered 2018-11-13: 10 meq via INTRAVENOUS
  Filled 2018-11-13: qty 100

## 2018-11-13 MED ORDER — DILTIAZEM HCL-DEXTROSE 100-5 MG/100ML-% IV SOLN (PREMIX)
5.0000 mg/h | INTRAVENOUS | Status: DC
  Administered 2018-11-13: 5 mg/h via INTRAVENOUS
  Filled 2018-11-13: qty 100

## 2018-11-13 MED ORDER — DILTIAZEM HCL 25 MG/5ML IV SOLN
10.0000 mg | Freq: Once | INTRAVENOUS | Status: AC
  Administered 2018-11-13: 10 mg via INTRAVENOUS
  Filled 2018-11-13: qty 5

## 2018-11-13 MED ORDER — HYDROCODONE-ACETAMINOPHEN 5-325 MG PO TABS
1.0000 | ORAL_TABLET | Freq: Once | ORAL | Status: DC
  Filled 2018-11-13: qty 1

## 2018-11-13 MED ORDER — ASPIRIN 325 MG PO TABS
325.0000 mg | ORAL_TABLET | Freq: Once | ORAL | Status: DC
  Filled 2018-11-13: qty 1

## 2018-11-13 MED ORDER — POTASSIUM CHLORIDE IN NACL 20-0.9 MEQ/L-% IV SOLN
INTRAVENOUS | Status: DC
  Administered 2018-11-13: 18:00:00 via INTRAVENOUS
  Filled 2018-11-13 (×2): qty 1000

## 2018-11-13 MED ORDER — POTASSIUM CHLORIDE CRYS ER 20 MEQ PO TBCR
40.0000 meq | EXTENDED_RELEASE_TABLET | Freq: Once | ORAL | Status: AC
  Administered 2018-11-13: 40 meq via ORAL
  Filled 2018-11-13: qty 2

## 2018-11-13 MED ORDER — ASPIRIN EC 81 MG PO TBEC: 81.0000 mg | DELAYED_RELEASE_TABLET | Freq: Every day | ORAL | Status: DC

## 2018-11-13 NOTE — H&P (Signed)
History and Physical    [name] YPP:[id number] DOB: [birth year] DOA: 11/13/2018  PCP: [name], MD  Patient coming from: Home  Chief Complaint: Chest pain and palpitations  HPI: [name] is a 72 y.o. female with medical history significant of hypertension, hypothyroidism comes in with sudden onset of substernal chest pain radiating down her right arm with associated nausea and vomiting x1 and feeling like her heart is racing.  Patient denies any recent illnesses no fevers no cough.  She denies any swelling in her legs.  She remembers that in the past she has had problems with her heart rate in the past but does not know she is been ever diagnosed with atrial fibrillation.  She has no coronary artery disease known.  On arrival patient found to have a heart rate of 145 A. fib with RVR.  Patient being referred for admission for new onset A. fib with RVR and chest pain.  Review of Systems: As per HPI otherwise 10 point review of systems negative.   Past Medical History:  Diagnosis Date  . Anxiety   . Arthritis   . Depression   . GERD (gastroesophageal reflux disease)   . High cholesterol   . Hypertension   . Hypothyroid     Past Surgical History:  Procedure Laterality Date  . APPENDECTOMY    . BUNIONECTOMY Bilateral   . CATARACT EXTRACTION W/PHACO Right 10/09/2017   Procedure: CATARACT EXTRACTION PHACO AND INTRAOCULAR LENS PLACEMENT (IOC);  Surgeon: [name], MD;  Location: [facility];  Service: Ophthalmology;  Laterality: Right;  CDE: 6.65  . CATARACT EXTRACTION W/PHACO Left 12/11/2017   Procedure: CATARACT EXTRACTION PHACO AND INTRAOCULAR LENS PLACEMENT (IOC);  Surgeon: [name], MD;  Location: [facility];  Service: Ophthalmology;  Laterality: Left;  CDE: 5.28  . COLONOSCOPY  06/18/2012   Procedure: COLONOSCOPY;  Surgeon: [name], MD;  Location: [facility];  Service: Endoscopy;  Laterality: N/A;  730  . ESOPHAGOGASTRODUODENOSCOPY    . hammer toe  repair Left   . HEMORRHOID SURGERY    . THYROIDECTOMY    . TONSILLECTOMY    . TOTAL ABDOMINAL HYSTERECTOMY    . TOTAL SHOULDER ARTHROPLASTY Left 02/06/2017  . TOTAL SHOULDER ARTHROPLASTY Left 02/06/2017   Procedure: TOTAL SHOULDER ARTHROPLASTY;  Surgeon: [name], MD;  Location: [place];  Service: Orthopedics;  Laterality: Left;     reports that she has never smoked. She has never used smokeless tobacco. She reports that she does not drink alcohol or use drugs.  Allergies  Allergen Reactions  . Latex Rash  . Penicillins Rash and Other (See Comments)    Has patient had a PCN reaction causing immediate rash, facial/tongue/throat swelling, SOB or lightheadedness with hypotension: #  #  #  YES  #  #  #  Has patient had a PCN reaction causing severe rash involving mucus membranes or skin necrosis: No Has patient had a PCN reaction that required hospitalization Yes Has patient had a PCN reaction occurring within the last 10 years: No If all of the above answers are "NO", then may proceed with Cephalosporin use.   . Sulfa Antibiotics Rash    Family History  Problem Relation Age of Onset  . Colon cancer Neg Hx     Prior to Admission medications   Medication Sig Start Date End Date Taking? Authorizing Provider  acetaminophen (TYLENOL) 325 MG tablet Take 650 mg by mouth every 6 (six) hours as needed for moderate  pain or headache.     [provider]  ALPRAZolam Duanne Moron) 0.25 MG tablet Take 0.25 mg by mouth daily as needed for anxiety.    [provider]  Ascorbic Acid (VITAMIN C) 1000 MG tablet Take 1,000 mg by mouth daily.     [provider]  atenolol-chlorthalidone (TENORETIC) 50-25 MG tablet Take 1 tablet by mouth daily.    [provider]  Calcium Carb-Cholecalciferol (CALCIUM 600 + D PO) Take 2 tablets daily by mouth.    [provider]  citalopram (CELEXA) 20 MG tablet Take 20 mg by mouth every evening.     [provider]    levothyroxine (SYNTHROID, LEVOTHROID) 75 MCG tablet Take 75 mcg by mouth daily.    [provider]  MAGNESIUM PO Take 2 tablets by mouth daily.     [provider]  Multiple Vitamins-Minerals (MULTIVITAMIN WITH MINERALS) tablet Take 1 tablet daily by mouth.    [provider]  polyethylene glycol (MIRALAX / GLYCOLAX) packet Take 17 g daily as needed by mouth for mild constipation.    [provider]  Propylene Glycol (SYSTANE BALANCE) 0.6 % SOLN Place 1 drop into both eyes 2 (two) times daily.     [provider]  simvastatin (ZOCOR) 20 MG tablet Take 20 mg by mouth daily.     [provider]  valsartan (DIOVAN) 320 MG tablet Take 320 mg by mouth every evening.    [provider]  Zinc 50 MG TABS Take 50 mg by mouth daily.     [provider]    Physical Exam: Vitals:   11/13/18 1517 11/13/18 1530 11/13/18 1630 11/13/18 1700  BP: 129/88 128/82 113/73 (!) 119/94  Pulse: (!) 135 (!) 134 83 85  Resp: (!) 23 14 16 13   Temp: 97.9 F (36.6 C)     TempSrc: Oral     SpO2: 96%     Weight:      Height:          Constitutional: NAD, calm, comfortable Vitals:   11/13/18 1517 11/13/18 1530 11/13/18 1630 11/13/18 1700  BP: 129/88 128/82 113/73 (!) 119/94  Pulse: (!) 135 (!) 134 83 85  Resp: (!) 23 14 16 13   Temp: 97.9 F (36.6 C)     TempSrc: Oral     SpO2: 96%     Weight:      Height:       Eyes: PERRL, lids and conjunctivae normal ENMT: Mucous membranes are moist. Posterior pharynx clear of any exudate or lesions.Normal dentition.  Neck: normal, supple, no masses, no thyromegaly Respiratory: clear to auscultation bilaterally, no wheezing, no crackles. Normal respiratory effort. No accessory muscle use.  Cardiovascular: irRegular rate and irrhythm, no murmurs / rubs / gallops. No extremity edema. 2+ pedal pulses. No carotid bruits.  Abdomen: no tenderness, no masses palpated. No hepatosplenomegaly. Bowel sounds  positive.  Musculoskeletal: no clubbing / cyanosis. No joint deformity upper and lower extremities. Good ROM, no contractures. Normal muscle tone.  Skin: no rashes, lesions, ulcers. No induration Neurologic: CN 2-12 grossly intact. Sensation intact, DTR normal. Strength 5/5 in all 4.  Psychiatric: Normal judgment and insight. Alert and oriented x 3. Normal mood.    Labs on Admission: I have personally reviewed following labs and imaging studies  CBC: Recent Labs  Lab 11/13/18 1545  WBC 9.5  NEUTROABS 5.8  HGB 14.6  HCT 45.7  MCV 93.5  PLT 330   Basic Metabolic Panel: Recent  Labs  Lab 11/13/18 1545  NA 140  K 2.9*  CL 104  CO2 28  GLUCOSE 120*  BUN 18  CREATININE 0.75  CALCIUM 9.8   GFR: Estimated Creatinine Clearance: 63.3 mL/min (by C-G formula based on SCr of 0.75 mg/dL). Liver Function Tests: Recent Labs  Lab 11/13/18 1545  AST 36  ALT 28  ALKPHOS 69  BILITOT 0.8  PROT 6.7  ALBUMIN 3.9   No results for input(s): LIPASE, AMYLASE in the last 168 hours. No results for input(s): AMMONIA in the last 168 hours. Coagulation Profile: No results for input(s): INR, PROTIME in the last 168 hours. Cardiac Enzymes: Recent Labs  Lab 11/13/18 1545  TROPONINI <0.03   BNP (last 3 results) No results for input(s): PROBNP in the last 8760 hours. HbA1C: No results for input(s): HGBA1C in the last 72 hours. CBG: No results for input(s): GLUCAP in the last 168 hours. Lipid Profile: No results for input(s): CHOL, HDL, LDLCALC, TRIG, CHOLHDL, LDLDIRECT in the last 72 hours. Thyroid Function Tests: No results for input(s): TSH, T4TOTAL, FREET4, T3FREE, THYROIDAB in the last 72 hours. Anemia Panel: No results for input(s): VITAMINB12, FOLATE, FERRITIN, TIBC, IRON, RETICCTPCT in the last 72 hours. Urine analysis: No results found for: COLORURINE, APPEARANCEUR, LABSPEC, PHURINE, GLUCOSEU, HGBUR, BILIRUBINUR, KETONESUR, PROTEINUR, UROBILINOGEN, NITRITE, LEUKOCYTESUR Sepsis  Labs: !!!!!!!!!!!!!!!!!!!!!!!!!!!!!!!!!!!!!!!!!!!! @LABRCNTIP (procalcitonin:4,lacticidven:4) )No results found for this or any previous visit (from the past 240 hour(s)).   Radiological Exams on Admission: Dg Chest Portable 1 View  Result Date: 11/13/2018 CLINICAL DATA:  Acute onset chest pain today.  Hypertension. EXAM: PORTABLE CHEST 1 VIEW COMPARISON:  None. FINDINGS: The heart size and mediastinal contours are within normal limits. Aortic atherosclerosis. Both lungs are clear. Left shoulder prosthesis and cervical spine fusion hardware noted. IMPRESSION: No active disease. Electronically Signed   By: Earle Gell M.D.   On: 11/13/2018 16:24    EKG: Independently reviewed. afib c rvr no ischemic changes Old chart reviewed Case discussed with Dr. Roderic Palau in the ED Chest x-ray reviewed no edema or infiltrate   Assessment/Plan 71 year old female with chest pain and A. fib with RVR Principal Problem:   New onset a-fib ([place]) w rvr-patient on Cardizem drip rate is currently in the 90s.  We will orally loaded with Cardizem 30 mg every 8 hours.  Obtain cardiac echo in the morning.  Check TSH level.  Serial cardiac enzymes.  Mali Vascor is 3.  Start on aspirin.  Active Problems:   Hypokalemia-repeat potassium and her IV fluids overnight and check a magnesium level    Hypertension-clarify home meds    High cholesterol-resume home meds    GERD (gastroesophageal reflux disease)-noted  Med rec pending pharmacy review    DVT prophylaxis: SCDs Code Status: Full Family Communication: Daughter Disposition Plan: Likely tomorrow if rate controlled and off Cardizem drip Consults called: None Admission status: Observation   Laken Rog A MD [facility]  If 7PM-7AM, please contact night-coverage [email or web address] Password [email or web address]  11/13/2018, 5:30 PM

## 2018-11-13 NOTE — ED Notes (Signed)
Date and time results received: 11/13/18 1946 (use smartphrase ".now" to insert current time)  Test: Troponin Critical Value: 0.03  Name of Provider Notified: Dr. Shanon Brow via Shea Evans  Orders Received? Or Actions Taken?: none at this time

## 2018-11-13 NOTE — ED Provider Notes (Signed)
[facility] EMERGENCY DEPARTMENT Provider Note   CSN: [id number] Arrival date & time: 11/13/18  1453     History   Chief Complaint Chief Complaint  Patient presents with  . Chest Pain    HPI [name] is a 72 y.o. female.  Patient complains of tightness in her chest and feeling her heartbeat fast  The history is provided by the patient.  Chest Pain   This is a new problem. The current episode started 3 to 5 hours ago. The problem occurs constantly. The problem has not changed since onset.The pain is associated with movement. The pain is present in the substernal region. The pain is at a severity of 2/10. The pain is mild. The quality of the pain is described as dull. The pain does not radiate. Pertinent negatives include no abdominal pain, no back pain, no cough and no headaches.  Pertinent negatives for past medical history include no seizures.    Past Medical History:  Diagnosis Date  . Anxiety   . Arthritis   . Depression   . GERD (gastroesophageal reflux disease)   . High cholesterol   . Hypertension   . Hypothyroid     Patient Active Problem List   Diagnosis Date Noted  . New onset a-fib ([place]) w rvr 11/13/2018  . Constipation 02/10/2017  . Status post total shoulder arthroplasty, left 02/06/2017  . Elevated transaminase level 01/25/2013  . GERD (gastroesophageal reflux disease) 01/25/2013  . Blood in stool 06/15/2012  . Hypertension 06/15/2012  . High cholesterol 06/15/2012    Past Surgical History:  Procedure Laterality Date  . APPENDECTOMY    . BUNIONECTOMY Bilateral   . CATARACT EXTRACTION W/PHACO Right 10/09/2017   Procedure: CATARACT EXTRACTION PHACO AND INTRAOCULAR LENS PLACEMENT (IOC);  Surgeon: [name], MD;  Location: [facility];  Service: Ophthalmology;  Laterality: Right;  CDE: 6.65  . CATARACT EXTRACTION W/PHACO Left 12/11/2017   Procedure: CATARACT EXTRACTION PHACO AND INTRAOCULAR LENS PLACEMENT (IOC);  Surgeon: [name], MD;   Location: [facility] OR[facility];  Service: Ophthalmology;  Laterality: Left;  CDE: 5.28  . COLONOSCOPY  06/18/2012   Procedure: COLONOSCOPY;  Surgeon: [name], MD;  Location: [facility]ITE;  Service: Endoscopy;  Laterality: N/A;  730  . ESOPHAGOGASTRODUODENOSCOPY    . hammer toe repair Left   . HEMORRHOID SURGERY    . THYROIDECTOMY    . TONSILLECTOMY    . TOTAL ABDOMINAL HYSTERECTOMY    . TOTAL SHOULDER ARTHROPLASTY Left 02/06/2017  . TOTAL SHOULDER ARTHROPLASTY Left 02/06/2017   Procedure: TOTAL SHOULDER ARTHROPLASTY;  Surgeon: [name], MD;  Location: [facility];  Service: Orthopedics;  Laterality: Left;     OB History   No obstetric history on file.      Home Medications    Prior to Admission medications   Medication Sig Start Date End Date Taking? Authorizing Provider  acetaminophen (TYLENOL) 325 MG tablet Take 650 mg by mouth every 6 (six) hours as needed for moderate pain or headache.     [provider]  ALPRAZolam Duanne Moron) 0.25 MG tablet Take 0.25 mg by mouth daily as needed for anxiety.    [provider]  Ascorbic Acid (VITAMIN C) 1000 MG tablet Take 1,000 mg by mouth daily.     [provider]  atenolol-chlorthalidone (TENORETIC) 50-25 MG tablet Take 1 tablet by mouth daily.    [provider]  Calcium Carb-Cholecalciferol (CALCIUM 600 + D PO) Take 2 tablets daily by mouth.  [provider]  citalopram (CELEXA) 20 MG tablet Take 20 mg by mouth every evening.     [provider]  levothyroxine (SYNTHROID, LEVOTHROID) 75 MCG tablet Take 75 mcg by mouth daily.    [provider]  MAGNESIUM PO Take 2 tablets by mouth daily.     [provider]  Multiple Vitamins-Minerals (MULTIVITAMIN WITH MINERALS) tablet Take 1 tablet daily by mouth.    [provider]  polyethylene glycol (MIRALAX / GLYCOLAX) packet Take 17 g daily as needed by mouth for mild constipation.    [provider]  Propylene Glycol  (SYSTANE BALANCE) 0.6 % SOLN Place 1 drop into both eyes 2 (two) times daily.     [provider]  simvastatin (ZOCOR) 20 MG tablet Take 20 mg by mouth daily.     [provider]  valsartan (DIOVAN) 320 MG tablet Take 320 mg by mouth every evening.    [provider]  Zinc 50 MG TABS Take 50 mg by mouth daily.     [provider]    Family History Family History  Problem Relation Age of Onset  . Colon cancer Neg Hx     Social History Social History   Tobacco Use  . Smoking status: Never Smoker  . Smokeless tobacco: Never Used  Substance Use Topics  . Alcohol use: No    Alcohol/week: 0.0 standard drinks  . Drug use: No     Allergies   Latex; Penicillins; and Sulfa antibiotics   Review of Systems Review of Systems  Constitutional: Negative for appetite change and fatigue.  HENT: Negative for congestion, ear discharge and sinus pressure.   Eyes: Negative for discharge.  Respiratory: Negative for cough.   Cardiovascular: Positive for chest pain.  Gastrointestinal: Negative for abdominal pain and diarrhea.  Genitourinary: Negative for frequency and hematuria.  Musculoskeletal: Negative for back pain.  Skin: Negative for rash.  Neurological: Negative for seizures and headaches.  Psychiatric/Behavioral: Negative for hallucinations.     Physical Exam Updated Vital Signs BP 128/82   Pulse (!) 134   Temp 97.9 F (36.6 C) (Oral)   Resp 14   Ht 5\' 2"  (1.575 m)   Wt 82.6 kg   SpO2 96%   BMI 33.29 kg/m   Physical Exam Constitutional:      Appearance: She is well-developed.  HENT:     Head: Normocephalic.     Nose: Nose normal.  Eyes:     General: No scleral icterus.    Conjunctiva/sclera: Conjunctivae normal.  Neck:     Musculoskeletal: Neck supple.     Thyroid: No thyromegaly.  Cardiovascular:     Heart sounds: No murmur. No friction rub. No gallop.      Comments: Rapid irregular heartbeat Pulmonary:     Breath sounds:  No stridor. No wheezing or rales.  Chest:     Chest wall: No tenderness.  Abdominal:     General: There is no distension.     Tenderness: There is no abdominal tenderness. There is no rebound.  Musculoskeletal: Normal range of motion.  Lymphadenopathy:     Cervical: No cervical adenopathy.  Skin:    Findings: No erythema or rash.  Neurological:     Mental Status: She is alert and oriented to person, place, and time.     Motor: No abnormal muscle tone.     Coordination: Coordination normal.  Psychiatric:        Behavior: Behavior normal.  ED Treatments / Results  Labs (all labs ordered are listed, but only abnormal results are displayed) Labs Reviewed  CBC WITH DIFFERENTIAL/PLATELET - Abnormal; Notable for the following components:      Result Value   Monocytes Absolute 1.1 (*)    All other components within normal limits  COMPREHENSIVE METABOLIC PANEL - Abnormal; Notable for the following components:   Potassium 2.9 (*)    Glucose, Bld 120 (*)    All other components within normal limits  TROPONIN I  TROPONIN I  TROPONIN I  TROPONIN I  TSH  BASIC METABOLIC PANEL  CBC  MAGNESIUM  I-STAT TROPONIN, ED    EKG EKG Interpretation  Date/Time:  Saturday November 13 2018 15:17:24 EST Ventricular Rate:  148 PR Interval:    QRS Duration: 103 QT Interval:  290 QTC Calculation: 455 R Axis:   29 Text Interpretation:  Atrial fibrillation Repolarization abnormality, prob rate related Confirmed by Milton Ferguson [phone]) on 11/13/2018 4:26:28 PM   Radiology Dg Chest Portable 1 View  Result Date: 11/13/2018 CLINICAL DATA:  Acute onset chest pain today.  Hypertension. EXAM: PORTABLE CHEST 1 VIEW COMPARISON:  None. FINDINGS: The heart size and mediastinal contours are within normal limits. Aortic atherosclerosis. Both lungs are clear. Left shoulder prosthesis and cervical spine fusion hardware noted. IMPRESSION: No active disease. Electronically Signed   By: Earle Gell  M.D.   On: 11/13/2018 16:24    Procedures Procedures (including critical care time)  Medications Ordered in ED Medications  diltiazem (CARDIZEM) 100 mg in dextrose 5% 187mL (1 mg/mL) infusion (5 mg/hr Intravenous New Bag/Given 11/13/18 1612)  aspirin tablet 325 mg (has no administration in time range)  HYDROcodone-acetaminophen (NORCO/VICODIN) 5-325 MG per tablet 1 tablet (has no administration in time range)  potassium chloride 10 mEq in 100 mL IVPB (10 mEq Intravenous New Bag/Given 11/13/18 1707)  0.9 % NaCl with KCl 20 mEq/ L  infusion (has no administration in time range)  aspirin EC tablet 81 mg (has no administration in time range)  diltiazem (CARDIZEM) tablet 30 mg (has no administration in time range)  diltiazem (CARDIZEM) injection 10 mg (10 mg Intravenous Given 11/13/18 1606)  potassium chloride SA (K-DUR,KLOR-CON) CR tablet 40 mEq (40 mEq Oral Given 11/13/18 1702)     Initial Impression / Assessment and Plan / ED Course  I have reviewed the triage vital signs and the nursing notes.  Pertinent labs & imaging results that were available during my care of the patient were reviewed by me and considered in my medical decision making (see chart for details).     CRITICAL CARE Performed by: Milton Ferguson Total critical care time: 40 minutes Critical care time was exclusive of separately billable procedures and treating other patients. Critical care was necessary to treat or prevent imminent or life-threatening deterioration. Critical care was time spent personally by me on the following activities: development of treatment plan with patient and/or surrogate as well as nursing, discussions with consultants, evaluation of patient's response to treatment, examination of patient, obtaining history from patient or surrogate, ordering and performing treatments and interventions, ordering and review of laboratory studies, ordering and review of radiographic studies, pulse oximetry and  re-evaluation of patient's condition.  Patient will be admitted to stepdown for rapid atrial fib.  Presently she is on Cardizem drip Final Clinical Impressions(s) / ED Diagnoses   Final diagnoses:  Atrial fibrillation with RVR [facility])    ED Discharge Orders    None  Milton Ferguson, MD 11/13/18 [phone]

## 2018-11-13 NOTE — ED Triage Notes (Signed)
CP since 1300 today across the center of chest that radiates to rt arm.  Also c/o nausea.  Had 324 mg asa prior to arrival

## 2018-11-14 ENCOUNTER — Observation Stay (HOSPITAL_BASED_OUTPATIENT_CLINIC_OR_DEPARTMENT_OTHER): Payer: Medicare Other

## 2018-11-14 ENCOUNTER — Other Ambulatory Visit: Payer: Self-pay

## 2018-11-14 DIAGNOSIS — E876 Hypokalemia: Secondary | ICD-10-CM | POA: Diagnosis not present

## 2018-11-14 DIAGNOSIS — I10 Essential (primary) hypertension: Secondary | ICD-10-CM | POA: Diagnosis not present

## 2018-11-14 DIAGNOSIS — I480 Paroxysmal atrial fibrillation: Secondary | ICD-10-CM | POA: Diagnosis not present

## 2018-11-14 DIAGNOSIS — R9431 Abnormal electrocardiogram [ECG] [EKG]: Secondary | ICD-10-CM

## 2018-11-14 DIAGNOSIS — E7800 Pure hypercholesterolemia, unspecified: Secondary | ICD-10-CM | POA: Diagnosis not present

## 2018-11-14 DIAGNOSIS — I4891 Unspecified atrial fibrillation: Secondary | ICD-10-CM | POA: Diagnosis not present

## 2018-11-14 LAB — BASIC METABOLIC PANEL
Anion gap: 5 (ref 5–15)
BUN: 14 mg/dL (ref 8–23)
CALCIUM: 8.6 mg/dL — AB (ref 8.9–10.3)
CO2: 25 mmol/L (ref 22–32)
Chloride: 111 mmol/L (ref 98–111)
Creatinine, Ser: 0.6 mg/dL (ref 0.44–1.00)
GFR calc Af Amer: >60 mL/min (ref >60)
GFR calc non Af Amer: >60 mL/min (ref >60)
Glucose, Bld: 96 mg/dL (ref 70–99)
Potassium: 3.3 mmol/L — ABNORMAL LOW (ref 3.5–5.1)
Sodium: 141 mmol/L (ref 135–145)

## 2018-11-14 LAB — ECHOCARDIOGRAM COMPLETE
Height: 63 in
Weight: 3008.84 oz

## 2018-11-14 LAB — CBG MONITORING, ED: Glucose-Capillary: 92 mg/dL (ref 70–99)

## 2018-11-14 LAB — CBC
HCT: 40.6 % (ref 36.0–46.0)
Hemoglobin: 12.9 g/dL (ref 12.0–15.0)
MCH: 30.1 pg (ref 26.0–34.0)
MCHC: 31.8 g/dL (ref 30.0–36.0)
MCV: 94.6 fL (ref 80.0–100.0)
Platelets: 208 10*3/uL (ref 150–400)
RBC: 4.29 MIL/uL (ref 3.87–5.11)
RDW: 14.1 % (ref 11.5–15.5)
WBC: 6.6 10*3/uL (ref 4.0–10.5)
nRBC: 0 % (ref 0.0–0.2)

## 2018-11-14 LAB — GLUCOSE, CAPILLARY: Glucose-Capillary: 95 mg/dL (ref 70–99)

## 2018-11-14 LAB — TROPONIN I: Troponin I: 0.03 ng/mL (ref <0.03)

## 2018-11-14 MED ORDER — ALPRAZOLAM 0.25 MG PO TABS
0.2500 mg | ORAL_TABLET | Freq: Every evening | ORAL | Status: DC | PRN
  Administered 2018-11-14: 0.25 mg via ORAL
  Filled 2018-11-14: qty 1

## 2018-11-14 MED ORDER — DILTIAZEM HCL ER COATED BEADS 120 MG PO CP24
120.0000 mg | ORAL_CAPSULE | Freq: Every day | ORAL | Status: DC
  Administered 2018-11-14 – 2018-11-15 (×2): 120 mg via ORAL
  Filled 2018-11-14 (×3): qty 1

## 2018-11-14 MED ORDER — ACETAMINOPHEN 325 MG PO TABS: 650.0000 mg | ORAL_TABLET | Freq: Four times a day (QID) | ORAL | Status: DC | PRN

## 2018-11-14 MED ORDER — POTASSIUM CHLORIDE CRYS ER 20 MEQ PO TBCR
40.0000 meq | EXTENDED_RELEASE_TABLET | Freq: Once | ORAL | Status: AC
  Administered 2018-11-14: 40 meq via ORAL
  Filled 2018-11-14: qty 2

## 2018-11-14 MED ORDER — ONDANSETRON HCL 4 MG/2ML IJ SOLN: 4.0000 mg | Freq: Four times a day (QID) | INTRAMUSCULAR | Status: DC | PRN

## 2018-11-14 MED ORDER — APIXABAN 5 MG PO TABS
5.0000 mg | ORAL_TABLET | Freq: Two times a day (BID) | ORAL | Status: DC
  Administered 2018-11-14 – 2018-11-15 (×3): 5 mg via ORAL
  Filled 2018-11-14 (×5): qty 1

## 2018-11-14 MED ORDER — ATORVASTATIN CALCIUM 20 MG PO TABS
20.0000 mg | ORAL_TABLET | Freq: Every day | ORAL | Status: DC
  Administered 2018-11-14: 20 mg via ORAL
  Filled 2018-11-14: qty 1

## 2018-11-14 MED ORDER — LEVOTHYROXINE SODIUM 25 MCG PO TABS
25.0000 ug | ORAL_TABLET | Freq: Every day | ORAL | Status: DC
  Administered 2018-11-15: 25 ug via ORAL
  Filled 2018-11-14: qty 1

## 2018-11-14 MED ORDER — CITALOPRAM HYDROBROMIDE 20 MG PO TABS
20.0000 mg | ORAL_TABLET | Freq: Every day | ORAL | Status: DC
  Administered 2018-11-14 – 2018-11-15 (×2): 20 mg via ORAL
  Filled 2018-11-14 (×2): qty 1

## 2018-11-14 MED ORDER — SIMVASTATIN 20 MG PO TABS: 40.0000 mg | ORAL_TABLET | Freq: Every day | ORAL | Status: DC

## 2018-11-14 NOTE — Progress Notes (Signed)
*  PRELIMINARY RESULTS* Echocardiogram 2D Echocardiogram has been performed.  [name] 11/14/2018, 10:04 AM

## 2018-11-14 NOTE — Progress Notes (Signed)
PROGRESS NOTE  [name]:[id number] DOB: 1946-09-29 DOA: 11/13/2018 PCP: [name], MD  Brief History:  72 year old female with a history of hypertension, hyperlipidemia, depression, hypothyroidism presenting with chest pain that began in the afternoon of 11/13/2018.  The patient was sitting in the chair at the time when she had substernal chest discomfort that radiated to her right arm.  She had palpitations at that time.  She was subsequently drove home after which she had 2 episodes of nausea vomiting associate with some diaphoresis.  She subsequently decided to present to emergency department for further evaluation.  The patient denied any dizziness or syncope or shortness of breath.  She continued to have palpitations.  The patient states that over the past month she has had intermittent episodes of chest discomfort and palpitations lasting less than 5 minutes without any syncope or dizziness.  She denied any fevers, chills, nausea, vomiting, diarrhea.  Upon presentation, the patient was noted to have atrial fibrillation with RVR with heart rate in the 140s.  She was started on diltiazem drip.  She spontaneously converted to sinus rhythm in the early morning 11/14/2018.  She was transitioned to oral diltiazem.  Assessment/Plan: New onset atrial fibrillation/paroxysmal atrial fibrillation -Spontaneously converted to sinus rhythm -Changed to long-acting diltiazem -Echocardiogram -TSH 1.081 -CHADSVASc = 4 (HTN, Age, Female, ASVD on CXR) -Start apixaban -Personally reviewed EKG--atrial fibrillation with ST depression V4-V5  Chest pain -May be related to the patient's atrial fibrillation -Troponins are minimally elevated, trend is flat--likely related to tachycardia -Echocardiogram -Started on apixaban -Personally reviewed chest x-ray--no edema or consolidation  Essential hypertension -Started diltiazem as discussed -Holding carvedilol to avoid  bradycardia--monitor BP off carvedilol -Holding valsartan and monitor BP -Holding chlorthalidone  Hypothyroidism -Continue Synthroid -TSH 1.081  Hyperlipidemia -Continue statin  Depression/anxiety -Continue Celexa and as needed alprazolam  Hypokalemia -Replete -Check magnesium    Disposition Plan:   Home 12/23 if cleared by cardiology Family Communication:   No Family at bedside  Consultants: Cardiology  Code Status:  FULL   DVT Prophylaxis: Apixaban   Procedures: As Listed in Progress Note Above  Antibiotics: None       Subjective: Patient denies fevers, chills, headache, chest pain, dyspnea, nausea, vomiting, diarrhea, abdominal pain, dysuria, hematuria, hematochezia, and melena.   Objective: Vitals:   11/14/18 0600 11/14/18 0630 11/14/18 0700 11/14/18 0735  BP:  (!) 120/57 (!) 119/59   Pulse:  (!) 59 (!) 58 68  Resp:  16 17 14   Temp: 97.7 F (36.5 C)   97.6 F (36.4 C)  TempSrc: Oral   Oral  SpO2:  97% 97% 98%  Weight: 85.3 kg     Height: 5\' 3"  (1.6 m)       Intake/Output Summary (Last 24 hours) at 11/14/2018 8882 Last data filed at 11/14/2018 0600 Gross per 24 hour  Intake 922.84 ml  Output -  Net 922.84 ml   Weight change:  Exam:   General:  Pt is alert, follows commands appropriately, not in acute distress  HEENT: No icterus, No thrush, No neck mass, [place]/AT  Cardiovascular: RRR, S1/S2, no rubs, no gallops  Respiratory: CTA bilaterally, no wheezing, no crackles, no rhonchi  Abdomen: Soft/+BS, non tender, non distended, no guarding  Extremities: No edema, No lymphangitis, No petechiae, No rashes, no synovitis   Data Reviewed: I have personally reviewed following labs and imaging studies Basic Metabolic Panel: Recent Labs  Lab 11/13/18 1545 11/14/18  0541  NA 140 141  K 2.9* 3.3*  CL 104 111  CO2 28 25  GLUCOSE 120* 96  BUN 18 14  CREATININE 0.75 0.60  CALCIUM 9.8 8.6*  MG 2.2  --    Liver Function Tests: Recent  Labs  Lab 11/13/18 1545  AST 36  ALT 28  ALKPHOS 69  BILITOT 0.8  PROT 6.7  ALBUMIN 3.9   No results for input(s): LIPASE, AMYLASE in the last 168 hours. No results for input(s): AMMONIA in the last 168 hours. Coagulation Profile: No results for input(s): INR, PROTIME in the last 168 hours. CBC: Recent Labs  Lab 11/13/18 1545 11/14/18 0541  WBC 9.5 6.6  NEUTROABS 5.8  --   HGB 14.6 12.9  HCT 45.7 40.6  MCV 93.5 94.6  PLT 247 208   Cardiac Enzymes: Recent Labs  Lab 11/13/18 1545 11/13/18 1851 11/13/18 2153 11/14/18 0541  TROPONINI <0.03 0.03* 0.04* 0.03*   BNP: Invalid input(s): POCBNP CBG: Recent Labs  Lab 11/14/18 0533  GLUCAP 92   HbA1C: No results for input(s): HGBA1C in the last 72 hours. Urine analysis: No results found for: COLORURINE, APPEARANCEUR, LABSPEC, PHURINE, GLUCOSEU, HGBUR, BILIRUBINUR, KETONESUR, PROTEINUR, UROBILINOGEN, NITRITE, LEUKOCYTESUR Sepsis Labs: @LABRCNTIP (procalcitonin:4,lacticidven:4) )No results found for this or any previous visit (from the past 240 hour(s)).   Scheduled Meds: . aspirin EC  81 mg Oral Daily  . diltiazem  30 mg Oral Q6H  . HYDROcodone-acetaminophen  1 tablet Oral Once   Continuous Infusions: . 0.9 % NaCl with KCl 20 mEq / L 75 mL/hr at 11/14/18 0600  . diltiazem (CARDIZEM) infusion Stopped (11/14/18 0057)    Procedures/Studies: Dg Chest Portable 1 View  Result Date: 11/13/2018 CLINICAL DATA:  Acute onset chest pain today.  Hypertension. EXAM: PORTABLE CHEST 1 VIEW COMPARISON:  None. FINDINGS: The heart size and mediastinal contours are within normal limits. Aortic atherosclerosis. Both lungs are clear. Left shoulder prosthesis and cervical spine fusion hardware noted. IMPRESSION: No active disease. Electronically Signed   By: Earle Gell M.D.   On: 11/13/2018 16:24    Orson Eva, DO  [facility] Pager [phone]  If 7PM-7AM, please contact night-coverage [email or web address] Password  [email or web address] 11/14/2018, 9:09 AM   LOS: 0 days

## 2018-11-14 NOTE — ED Notes (Signed)
Cardizem IV stopped per Dr. Shanon Brow order.

## 2018-11-15 ENCOUNTER — Encounter (HOSPITAL_COMMUNITY): Payer: Self-pay | Admitting: Cardiology

## 2018-11-15 DIAGNOSIS — I480 Paroxysmal atrial fibrillation: Principal | ICD-10-CM

## 2018-11-15 DIAGNOSIS — I10 Essential (primary) hypertension: Secondary | ICD-10-CM | POA: Diagnosis not present

## 2018-11-15 DIAGNOSIS — I4891 Unspecified atrial fibrillation: Secondary | ICD-10-CM | POA: Diagnosis not present

## 2018-11-15 DIAGNOSIS — E876 Hypokalemia: Secondary | ICD-10-CM | POA: Diagnosis not present

## 2018-11-15 DIAGNOSIS — E7800 Pure hypercholesterolemia, unspecified: Secondary | ICD-10-CM | POA: Diagnosis not present

## 2018-11-15 LAB — BASIC METABOLIC PANEL
Anion gap: 5 (ref 5–15)
BUN: 11 mg/dL (ref 8–23)
CO2: 25 mmol/L (ref 22–32)
Calcium: 8.9 mg/dL (ref 8.9–10.3)
Chloride: 110 mmol/L (ref 98–111)
Creatinine, Ser: 0.58 mg/dL (ref 0.44–1.00)
GFR calc non Af Amer: >60 mL/min (ref >60)
Glucose, Bld: 94 mg/dL (ref 70–99)
Potassium: 3.5 mmol/L (ref 3.5–5.1)
SODIUM: 140 mmol/L (ref 135–145)

## 2018-11-15 LAB — LIPID PANEL
CHOLESTEROL: 117 mg/dL (ref 0–200)
HDL: 39 mg/dL — ABNORMAL LOW (ref >40)
LDL Cholesterol: 58 mg/dL (ref 0–99)
TRIGLYCERIDES: 102 mg/dL (ref <150)
Total CHOL/HDL Ratio: 3 RATIO
VLDL: 20 mg/dL (ref 0–40)

## 2018-11-15 LAB — MAGNESIUM: Magnesium: 1.9 mg/dL (ref 1.7–2.4)

## 2018-11-15 MED ORDER — LEVOTHYROXINE SODIUM 75 MCG PO TABS: 75.0000 ug | ORAL_TABLET | Freq: Every day | ORAL | Status: DC

## 2018-11-15 MED ORDER — POTASSIUM CHLORIDE CRYS ER 20 MEQ PO TBCR
40.0000 meq | EXTENDED_RELEASE_TABLET | Freq: Once | ORAL | Status: AC
  Administered 2018-11-15: 40 meq via ORAL
  Filled 2018-11-15: qty 2

## 2018-11-15 MED ORDER — APIXABAN 5 MG PO TABS: 5.0000 mg | ORAL_TABLET | Freq: Two times a day (BID) | ORAL | 0 refills | Status: DC

## 2018-11-15 MED ORDER — DILTIAZEM HCL ER COATED BEADS 120 MG PO CP24: 120.0000 mg | ORAL_CAPSULE | Freq: Every day | ORAL | 0 refills | Status: DC

## 2018-11-15 MED ORDER — LEVOTHYROXINE SODIUM 25 MCG PO TABS
50.0000 ug | ORAL_TABLET | Freq: Once | ORAL | Status: AC
  Administered 2018-11-15: 50 ug via ORAL
  Filled 2018-11-15: qty 2

## 2018-11-15 MED ORDER — MAGNESIUM SULFATE 2 GM/50ML IV SOLN
2.0000 g | Freq: Once | INTRAVENOUS | Status: AC
  Administered 2018-11-15: 2 g via INTRAVENOUS
  Filled 2018-11-15: qty 50

## 2018-11-15 NOTE — Consult Note (Signed)
Cardiology Consultation:   Patient ID: [name]; [id number]; 01-Oct-1946   Admit date: 11/13/2018 Date of Consult: 11/15/2018  Primary Care Provider: [name], MD Consulting Cardiologist: Dr. Satira Sark   Patient Profile:   [name] is a 72 y.o. female with a history of hypertension, hyperlipidemia, GERD, and hypothyroidism who is being seen today for the evaluation of newly documented atrial fibrillation at the request of Dr. Carles Collet.  History of Present Illness:   [name] presents to the hospital complaining of recent onset palpitations associated with chest discomfort, occurred at rest and without obvious provocation.  In retrospect she states that she has felt similar symptoms although typically more brief over the last several months.  Noted to be in rapid atrial fibrillation on evaluation, placed on intravenous diltiazem, and spontaneously converted to sinus rhythm.  She has undergone recent medication adjustments for hypertension per her PCP Dr. Woody Seller in [place].  She was just recently placed on Coreg and chlorthalidone, had been on valsartan longer-term.  CHADSVASC score is 3.  Past Medical History:  Diagnosis Date  . Anxiety   . Arthritis   . Depression   . GERD (gastroesophageal reflux disease)   . Hyperlipidemia   . Hypertension   . Hypothyroidism     Past Surgical History:  Procedure Laterality Date  . APPENDECTOMY    . BUNIONECTOMY Bilateral   . CATARACT EXTRACTION W/PHACO Right 10/09/2017   Procedure: CATARACT EXTRACTION PHACO AND INTRAOCULAR LENS PLACEMENT (IOC);  Surgeon: [name], MD;  Location: [facility];  Service: Ophthalmology;  Laterality: Right;  CDE: 6.65  . CATARACT EXTRACTION W/PHACO Left 12/11/2017   Procedure: CATARACT EXTRACTION PHACO AND INTRAOCULAR LENS PLACEMENT (IOC);  Surgeon: [name], MD;  Location: [facility];  Service: Ophthalmology;  Laterality: Left;  CDE: 5.28  . COLONOSCOPY  06/18/2012   Procedure:  COLONOSCOPY;  Surgeon: [name], MD;  Location: [facility];  Service: Endoscopy;  Laterality: N/A;  730  . ESOPHAGOGASTRODUODENOSCOPY    . Hammer toe repair Left   . HEMORRHOID SURGERY    . THYROIDECTOMY    . TONSILLECTOMY    . TOTAL ABDOMINAL HYSTERECTOMY    . TOTAL SHOULDER ARTHROPLASTY Left 02/06/2017  . TOTAL SHOULDER ARTHROPLASTY Left 02/06/2017   Procedure: TOTAL SHOULDER ARTHROPLASTY;  Surgeon: [name], MD;  Location: [facility];  Service: Orthopedics;  Laterality: Left;     Inpatient Medications: Scheduled Meds: . apixaban  5 mg Oral BID  . atorvastatin  20 mg Oral q1800  . citalopram  20 mg Oral Daily  . diltiazem  120 mg Oral Daily  . HYDROcodone-acetaminophen  1 tablet Oral Once  . levothyroxine  50 mcg Oral Once  . [START ON 11/16/2018] levothyroxine  75 mcg Oral Q breakfast  . potassium chloride  40 mEq Oral Once   Continuous Infusions: . magnesium sulfate 1 - 4 g bolus IVPB     PRN Meds: acetaminophen, ALPRAZolam, ondansetron (ZOFRAN) IV  Allergies:    Allergies  Allergen Reactions  . Latex Rash  . Penicillins Rash and Other (See Comments)    Has patient had a PCN reaction causing immediate rash, facial/tongue/throat swelling, SOB or lightheadedness with hypotension: #  #  #  YES  #  #  #  Has patient had a PCN reaction causing severe rash involving mucus membranes or skin necrosis: No Has patient had a PCN reaction that required hospitalization Yes Has patient had a PCN reaction occurring within the  last 10 years: No If all of the above answers are "NO", then may proceed with Cephalosporin use.   . Sulfa Antibiotics Rash  . Tramadol Itching    Social History:   Social History   Socioeconomic History  . Marital status: Married    Spouse name: Not on file  . Number of children: Not on file  . Years of education: Not on file  . Highest education level: Not on file  Occupational History  . Not on file  Social Needs  . Financial resource  strain: Not on file  . Food insecurity:    Worry: Not on file    Inability: Not on file  . Transportation needs:    Medical: Not on file    Non-medical: Not on file  Tobacco Use  . Smoking status: Never Smoker  . Smokeless tobacco: Never Used  Substance and Sexual Activity  . Alcohol use: No    Alcohol/week: 0.0 standard drinks  . Drug use: No  . Sexual activity: Never    Birth control/protection: Surgical  Lifestyle  . Physical activity:    Days per week: Not on file    Minutes per session: Not on file  . Stress: Not on file  Relationships  . Social connections:    Talks on phone: Not on file    Gets together: Not on file    Attends religious service: Not on file    Active member of club or organization: Not on file    Attends meetings of clubs or organizations: Not on file    Relationship status: Not on file  . Intimate partner violence:    Fear of current or ex partner: Not on file    Emotionally abused: Not on file    Physically abused: Not on file    Forced sexual activity: Not on file  Other Topics Concern  . Not on file  Social History Narrative  . Not on file    Family History:   The patient's family history is negative for Colon cancer.  ROS:  Please see the history of present illness.  All other ROS reviewed and negative.     Physical Exam/Data:   Vitals:   11/15/18 0500 11/15/18 0600 11/15/18 0700 11/15/18 0758  BP: 124/64 136/63 126/66   Pulse: (!) 59 63 (!) 59 75  Resp:  18 13 (!) 21  Temp:    97.8 F (36.6 C)  TempSrc:    Oral  SpO2: 95% 97% 96%   Weight:      Height:        Intake/Output Summary (Last 24 hours) at 11/15/2018 0925 Last data filed at 11/14/2018 2106 Gross per 24 hour  Intake 250 ml  Output 500 ml  Net -250 ml   Filed Weights   11/13/18 1513 11/14/18 0600 11/15/18 0346  Weight: 82.6 kg 85.3 kg 86.3 kg   Body mass index is 33.7 kg/m.   Gen: Elderly woman, appears comfortable at rest. HEENT: Conjunctiva and lids  normal, oropharynx clear. Neck: Supple, no elevated JVP or carotid bruits, no thyromegaly. Lungs: Clear to auscultation, nonlabored breathing at rest. Cardiac: Regular rate and rhythm, no S3, soft systolic murmur, no pericardial rub. Abdomen: Soft, nontender, bowel sounds present, no guarding or rebound. Extremities: No pitting edema, distal pulses 2+. Skin: Warm and dry. Musculoskeletal: No kyphosis. Neuropsychiatric: Alert and oriented x3, affect grossly appropriate.  EKG:  I personally reviewed the tracing from 11/13/2018 which showed rapid atrial fibrillation with diffuse  ST-T wave abnormalities.  Telemetry:  I personally reviewed telemetry which shows sinus rhythm currently.  Relevant CV Studies:  Echocardiogram 11/15/2018: Study Conclusions  - Left ventricle: The cavity size was normal. Wall thickness was   increased in a pattern of moderate LVH. Systolic function was   normal. The estimated ejection fraction was in the range of 55%   to 60%. Wall motion was normal; there were no regional wall   motion abnormalities. Features are consistent with a pseudonormal   left ventricular filling pattern, with concomitant abnormal   relaxation and increased filling pressure (grade 2 diastolic   dysfunction).  Impressions:  - Normal LV systolic function; moderate diastolic dysfunction;   moderate LVH.  Laboratory Data:  Chemistry Recent Labs  Lab 11/13/18 1545 11/14/18 0541 11/15/18 0413  NA 140 141 140  K 2.9* 3.3* 3.5  CL 104 111 110  CO2 28 25 25   GLUCOSE 120* 96 94  BUN 18 14 11   CREATININE 0.75 0.60 0.58  CALCIUM 9.8 8.6* 8.9  GFRNONAA >60 >60 >60  GFRAA >60 >60 >60  ANIONGAP 8 5 5     Recent Labs  Lab 11/13/18 1545  PROT 6.7  ALBUMIN 3.9  AST 36  ALT 28  ALKPHOS 69  BILITOT 0.8   Hematology Recent Labs  Lab 11/13/18 1545 11/14/18 0541  WBC 9.5 6.6  RBC 4.89 4.29  HGB 14.6 12.9  HCT 45.7 40.6  MCV 93.5 94.6  MCH 29.9 30.1  MCHC 31.9 31.8    RDW 13.7 14.1  PLT 247 208   Cardiac Enzymes Recent Labs  Lab 11/13/18 1545 11/13/18 1851 11/13/18 2153 11/14/18 0541  TROPONINI <0.03 0.03* 0.04* 0.03*    Recent Labs  Lab 11/13/18 1619  TROPIPOC 0.01    Radiology/Studies:  Dg Chest Portable 1 View  Result Date: 11/13/2018 CLINICAL DATA:  Acute onset chest pain today.  Hypertension. EXAM: PORTABLE CHEST 1 VIEW COMPARISON:  None. FINDINGS: The heart size and mediastinal contours are within normal limits. Aortic atherosclerosis. Both lungs are clear. Left shoulder prosthesis and cervical spine fusion hardware noted. IMPRESSION: No active disease. Electronically Signed   By: Earle Gell M.D.   On: 11/13/2018 16:24    Assessment and Plan:   1.  Newly documented atrial fibrillation with CHADSVASC score of 3.  Based on discussion of symptoms, I suspect she has had paroxysmal atrial fibrillation over time.  She is currently in sinus rhythm having spontaneously converted with rate control.  2.  Essential hypertension.  3.  History of hypothyroidism, recent TSH normal.  She is on Synthroid.  4.  Hypokalemia at presentation.  States that she was recently placed on chlorthalidone, no potassium supplement as an outpatient.  5.  Mixed hyperlipidemia, on Zocor as an outpatient.  Agree with initiation of Eliquis 5 mg twice daily for stroke prophylaxis, I discussed this with the patient.  Initiate Cardizem CD 120 mg daily, do not resume Coreg which was recently started as an outpatient.  Calcium channel blocker can be uptitrated if needed.  Depending on blood pressure trend, she may be able to go back on chlorthalidone but will need a potassium supplement.  Whether or not Diovan is then needed for additional blood pressure control can be determined later.  No further inpatient cardiac testing is planned.  Recommend ambulate with consideration of discharge home today.  We will plan to see her back in our [place] office.   Signed, Rozann Lesches, MD  11/15/2018 9:25 AM

## 2018-11-15 NOTE — Discharge Summary (Signed)
Physician Discharge Summary  [name] NUU:[id number] DOB: 23-Nov-1946 DOA: 11/13/2018  PCP: Deborah Chroman, MD  Admit date: 11/13/2018 Discharge date: 11/15/2018  Time spent: 60 minutes  Recommendations for Outpatient Follow-up:  1. Follow-up with Deborah Chroman, MD in 2 weeks.  On follow-up patient needs a basic metabolic profile done to follow-up on electrolytes and renal function.  Patient also need a magnesium level checked.  Patient blood pressure need to be reassessed as patient's antihypertensive medications were discontinued during his hospitalization and patient maintained on diltiazem.  If further blood pressure control is needed patient diltiazem may be uptitrated and if needed patient's chlorthalidone may be resumed however patient will need a potassium supplementation in addition. 2. Follow-up with Dr. Domenic Roberts, cardiology in 2 weeks.   Discharge Diagnoses:  Principal Problem:   New onset a-fib ([place]) w rvr Active Problems:   Hypertension   High cholesterol   GERD (gastroesophageal reflux disease)   Hypokalemia   Atrial fibrillation with RVR ([place])   Discharge Condition: Stable and improved  Diet recommendation: Heart healthy  Filed Weights   11/13/18 1513 11/14/18 0600 11/15/18 0346  Weight: 82.6 kg 85.3 kg 86.3 kg    History of present illness:  Per [name] is a 72 y.o. female with medical history significant of hypertension, hypothyroidism presented to ED with sudden onset of substernal chest pain radiating down her right arm with associated nausea and vomiting x1 and feeling like her heart was racing.  Patient denied any recent illnesses no fevers no cough.  She denied any swelling in her legs.  She remembers that in the past she has had problems with her heart rate in the past but does not know she has been ever diagnosed with atrial fibrillation.  She has no coronary artery disease known.  On arrival patient found to have a heart rate of 145  A. fib with RVR.  Patient being referred for admission for new onset A. fib with RVR and chest pain.   Hospital Course:  New onset atrial fibrillation/paroxysmal atrial fibrillation CHA2DSVASC SCORE 3 -Patient was admitted with A. fib with RVR placed in the stepdown unit and placed on IV diltiazem.  Cardiac enzymes were cycled which were minimally elevated however noted to have a flat trend.  2D echo was obtained with a EF of 55 to 60%, no wall motion abnormalities, grade 2 diastolic dysfunction, moderate LVH.  Patient noted to spontaneously convert to sinus rhythm and subsequently transition to long-acting diltiazem 120 mg daily.  Patient's heart rate improved on regimen of diltiazem.  Patient seen in consultation by cardiology who was in agreement with starting anticoagulation with Eliquis 5 mg twice daily.  TSH which was obtained was within normal limits.  Per cardiology no further ischemic work-up needed.  Patient was discharged home in stable and improved condition will follow-up with cardiology in the outpatient setting.  Chest pain -Likely secondary to patient's atrial fibrillation.   -Cardiac enzymes which were cycled were minimally elevated with a flat trend felt likely secondary to A. fib.  2D echo was done with a EF of 55 to 60%, no wall motion abnormalities, grade 2 diastolic dysfunction, moderate LVH.  Patient was started on apixaban for A. fib.  Patient was assessed by cardiology and no further ischemic work-up needed at this time.  Outpatient follow-up with cardiology.   Essential hypertension -Patient was placed on diltiazem for A. fib with RVR.  Patient's Coreg was held to avoid bradycardia  as well as valsartan and chlorthalidone.  Patient's blood pressure remained stable on diltiazem.  It was recommended per cardiology not to resume patient's Coreg and that patient's calcium channel blocker can be uptitrated if needed.  Is recommended per cardiology that if further blood pressure  control is needed after up titration of patient's calcium channel blocker patient may need to go back on the chlorthalidone but will need a potassium supplement.  Patient's Diovan was not resumed on discharge.  Outpatient follow-up with PCP and cardiology.   Hypothyroidism -TSH noted to be 1.081.  Patient was placed on home regimen of Synthroid.  Outpatient follow-up with PCP.    Hyperlipidemia -Patient maintained on home regimen of statin.    Depression/anxiety -Patient maintained on home regimen of Celexa and as needed alprazolam  Hypokalemia -Repleted.  Magnesium noted to be at 1.9 and patient given a dose of IV magnesium.  Outpatient follow-up.   Procedures:  2D echo 11/14/2018  Chest x-ray 11/13/2018  Consultations:  Cardiology: Dr [name] 11/15/2018  Discharge Exam: Vitals:   11/15/18 1300 11/15/18 1400  BP: (!) 120/58 131/65  Pulse: 76 69  Resp:    Temp:    SpO2: 96% 96%    General: NAD Cardiovascular: RRR Respiratory: CTAB  Discharge Instructions   Discharge Instructions    Diet - low sodium heart healthy   Complete by:  As directed    Increase activity slowly   Complete by:  As directed      Allergies as of 11/15/2018      Reactions   Latex Rash   Penicillins Rash, Other (See Comments)   Has patient had a PCN reaction causing immediate rash, facial/tongue/throat swelling, SOB or lightheadedness with hypotension: #  #  #  YES  #  #  #  Has patient had a PCN reaction causing severe rash involving mucus membranes or skin necrosis: No Has patient had a PCN reaction that required hospitalization Yes Has patient had a PCN reaction occurring within the last 10 years: No If all of the above answers are "NO", then may proceed with Cephalosporin use.   Sulfa Antibiotics Rash   Tramadol Itching      Medication List    STOP taking these medications   carvedilol 25 MG tablet Commonly known as:  COREG   chlorthalidone 25 MG tablet Commonly known  as:  HYGROTON   valsartan 320 MG tablet Commonly known as:  DIOVAN     TAKE these medications   acetaminophen 325 MG tablet Commonly known as:  TYLENOL Take 650 mg by mouth every 6 (six) hours as needed for moderate pain or headache.   ALPRAZolam 0.25 MG tablet Commonly known as:  XANAX Take 0.25 mg by mouth daily as needed for anxiety.   apixaban 5 MG Tabs tablet Commonly known as:  ELIQUIS Take 1 tablet (5 mg total) by mouth 2 (two) times daily.   CALCIUM 600 + D PO Take 2 tablets daily by mouth.   citalopram 20 MG tablet Commonly known as:  CELEXA Take 20 mg by mouth every morning.   diltiazem 120 MG 24 hr capsule Commonly known as:  CARDIZEM CD Take 1 capsule (120 mg total) by mouth daily. Start taking on:  November 16, 2018   levothyroxine 75 MCG tablet Commonly known as:  SYNTHROID, LEVOTHROID Take 75 mcg by mouth every morning.   MAGNESIUM PO Take 2 tablets by mouth every morning.   multivitamin with minerals tablet Take 1 tablet daily by  mouth.   polyethylene glycol packet Commonly known as:  MIRALAX / GLYCOLAX Take 17 g daily as needed by mouth for mild constipation.   PREMARIN vaginal cream Generic drug:  conjugated estrogens Place 1 Applicatorful vaginally daily.   simvastatin 40 MG tablet Commonly known as:  ZOCOR Take 40 mg by mouth every morning.   SYSTANE BALANCE 0.6 % Soln Generic drug:  Propylene Glycol Place 1 drop into both eyes 2 (two) times daily.      Allergies  Allergen Reactions  . Latex Rash  . Penicillins Rash and Other (See Comments)    Has patient had a PCN reaction causing immediate rash, facial/tongue/throat swelling, SOB or lightheadedness with hypotension: #  #  #  YES  #  #  #  Has patient had a PCN reaction causing severe rash involving mucus membranes or skin necrosis: No Has patient had a PCN reaction that required hospitalization Yes Has patient had a PCN reaction occurring within the last 10 years: No If all of  the above answers are "NO", then may proceed with Cephalosporin use.   . Sulfa Antibiotics Rash  . Tramadol Itching   Follow-up Information    Vyas, [name] B, MD. Schedule an appointment as soon as possible for a visit in 2 week(s).   Specialty:  Internal Medicine Contact information: [place] [address] [phone]        Satira Sark, MD. Schedule an appointment as soon as possible for a visit in 2 week(s).   Specialty:  Cardiology Contact information: [place] [address] [phone]            The results of significant diagnostics from this hospitalization (including imaging, microbiology, ancillary and laboratory) are listed below for reference.    Significant Diagnostic Studies: Dg Chest Portable 1 View  Result Date: 11/13/2018 CLINICAL DATA:  Acute onset chest pain today.  Hypertension. EXAM: PORTABLE CHEST 1 VIEW COMPARISON:  None. FINDINGS: The heart size and mediastinal contours are within normal limits. Aortic atherosclerosis. Both lungs are clear. Left shoulder prosthesis and cervical spine fusion hardware noted. IMPRESSION: No active disease. Electronically Signed   By: Earle Gell M.D.   On: 11/13/2018 16:24    Microbiology: No results found for this or any previous visit (from the past 240 hour(s)).   Labs: Basic Metabolic Panel: Recent Labs  Lab 11/13/18 1545 11/14/18 0541 11/15/18 0413  NA 140 141 140  K 2.9* 3.3* 3.5  CL 104 111 110  CO2 28 25 25   GLUCOSE 120* 96 94  BUN 18 14 11   CREATININE 0.75 0.60 0.58  CALCIUM 9.8 8.6* 8.9  MG 2.2  --  1.9   Liver Function Tests: Recent Labs  Lab 11/13/18 1545  AST 36  ALT 28  ALKPHOS 69  BILITOT 0.8  PROT 6.7  ALBUMIN 3.9   No results for input(s): LIPASE, AMYLASE in the last 168 hours. No results for input(s): AMMONIA in the last 168 hours. CBC: Recent Labs  Lab 11/13/18 1545 11/14/18 0541  WBC 9.5 6.6  NEUTROABS 5.8  --   HGB 14.6 12.9  HCT 45.7 40.6   MCV 93.5 94.6  PLT 247 208   Cardiac Enzymes: Recent Labs  Lab 11/13/18 1545 11/13/18 1851 11/13/18 2153 11/14/18 0541  TROPONINI <0.03 0.03* 0.04* 0.03*   BNP: BNP (last 3 results) No results for input(s): BNP in the last 8760 hours.  ProBNP (last 3 results) No results for input(s):  PROBNP in the last 8760 hours.  CBG: Recent Labs  Lab 11/14/18 0533 11/14/18 1139  GLUCAP 92 95       Signed:  Irine Seal MD.  [facility] 11/15/2018, 3:45 PM

## 2018-11-15 NOTE — Progress Notes (Signed)
Pt ambulated around the unit. HR at 72 in sinus rhythm. During ambulation HR increased to 102 but quickly decreased to 80's. Pt got back into bed without requiring assistance and HR is now 74 in NSR. NAD noted. Will continue to monitor.

## 2018-11-19 DIAGNOSIS — Z6834 Body mass index (BMI) 34.0-34.9, adult: Secondary | ICD-10-CM | POA: Diagnosis not present

## 2018-11-19 DIAGNOSIS — F419 Anxiety disorder, unspecified: Secondary | ICD-10-CM | POA: Diagnosis not present

## 2018-11-19 DIAGNOSIS — I10 Essential (primary) hypertension: Secondary | ICD-10-CM | POA: Diagnosis not present

## 2018-11-19 DIAGNOSIS — I4891 Unspecified atrial fibrillation: Secondary | ICD-10-CM | POA: Diagnosis not present

## 2018-11-19 DIAGNOSIS — Z79899 Other long term (current) drug therapy: Secondary | ICD-10-CM | POA: Diagnosis not present

## 2018-11-19 DIAGNOSIS — Z299 Encounter for prophylactic measures, unspecified: Secondary | ICD-10-CM | POA: Diagnosis not present

## 2018-11-29 DIAGNOSIS — Z299 Encounter for prophylactic measures, unspecified: Secondary | ICD-10-CM | POA: Diagnosis not present

## 2018-11-29 DIAGNOSIS — I4891 Unspecified atrial fibrillation: Secondary | ICD-10-CM | POA: Diagnosis not present

## 2018-11-29 DIAGNOSIS — I10 Essential (primary) hypertension: Secondary | ICD-10-CM | POA: Diagnosis not present

## 2018-11-29 DIAGNOSIS — Z6834 Body mass index (BMI) 34.0-34.9, adult: Secondary | ICD-10-CM | POA: Diagnosis not present

## 2018-11-29 DIAGNOSIS — R413 Other amnesia: Secondary | ICD-10-CM | POA: Diagnosis not present

## 2018-12-09 DIAGNOSIS — H02135 Senile ectropion of left lower eyelid: Secondary | ICD-10-CM | POA: Diagnosis not present

## 2018-12-09 DIAGNOSIS — H04552 Acquired stenosis of left nasolacrimal duct: Secondary | ICD-10-CM | POA: Diagnosis not present

## 2018-12-16 DIAGNOSIS — R413 Other amnesia: Secondary | ICD-10-CM | POA: Diagnosis not present

## 2018-12-22 DIAGNOSIS — Z299 Encounter for prophylactic measures, unspecified: Secondary | ICD-10-CM | POA: Diagnosis not present

## 2018-12-22 DIAGNOSIS — Z6832 Body mass index (BMI) 32.0-32.9, adult: Secondary | ICD-10-CM | POA: Diagnosis not present

## 2018-12-22 DIAGNOSIS — I10 Essential (primary) hypertension: Secondary | ICD-10-CM | POA: Diagnosis not present

## 2018-12-24 ENCOUNTER — Encounter: Payer: Self-pay | Admitting: Cardiology

## 2018-12-24 ENCOUNTER — Ambulatory Visit (INDEPENDENT_AMBULATORY_CARE_PROVIDER_SITE_OTHER): Payer: Medicare Other | Admitting: Cardiology

## 2018-12-24 VITALS — BP 158/88 | HR 67 | Ht 63.0 in | Wt 180.0 lb

## 2018-12-24 DIAGNOSIS — I480 Paroxysmal atrial fibrillation: Secondary | ICD-10-CM

## 2018-12-24 DIAGNOSIS — I10 Essential (primary) hypertension: Secondary | ICD-10-CM

## 2018-12-24 DIAGNOSIS — E782 Mixed hyperlipidemia: Secondary | ICD-10-CM

## 2018-12-24 DIAGNOSIS — E039 Hypothyroidism, unspecified: Secondary | ICD-10-CM

## 2018-12-24 NOTE — Patient Instructions (Addendum)
Medication Instructions:   Your physician recommends that you continue on your current medications as directed. Please refer to the Current Medication list given to you today.  Labwork:  Your physician recommends that you return for lab work in: 6 months just before your next visit to check your BMET & CBC. We will let you know when this is due.  Testing/Procedures:  NONE  Follow-Up:  Your physician recommends that you schedule a follow-up appointment in: 6 months. You will receive a reminder letter in the mail in about 4 months reminding you to call and schedule your appointment. If you don't receive this letter, please contact our office.  Any Other Special Instructions Will Be Listed Below (If Applicable).  If you need a refill on your cardiac medications before your next appointment, please call your pharmacy.

## 2018-12-24 NOTE — Progress Notes (Signed)
Cardiology Office Note  Date: 12/24/2018   ID: [name], [name] [birth year], MRN [id number]  PCP: [name], MD  Primary Cardiologist: Rozann Lesches, MD   Chief Complaint  Patient presents with  . Atrial Fibrillation    History of Present Illness: [name] is a 73 y.o. female presenting for post hospital visit.  She was seen recently in consultation in late December 2019 with newly documented atrial fibrillation and CHADSVASC score of 3.  She spontaneously converted to sinus rhythm with heart rate control and was placed on Eliquis along with Cardizem CD.  She presents for a follow-up visit.  Reports rare sense of a skipped beat but no prolonged palpitations.  Cardizem CD has been uptitrated to 240 mg daily by PCP, she is also on chlorthalidone for blood pressure control.  She does not report any obvious bleeding problems on Eliquis.  Past Medical History:  Diagnosis Date  . Anxiety   . Arthritis   . Atrial fibrillation [facility])    Documented December 2019  . Depression   . GERD (gastroesophageal reflux disease)   . Hyperlipidemia   . Hypertension   . Hypothyroidism     Past Surgical History:  Procedure Laterality Date  . APPENDECTOMY    . BUNIONECTOMY Bilateral   . CATARACT EXTRACTION W/PHACO Right 10/09/2017   Procedure: CATARACT EXTRACTION PHACO AND INTRAOCULAR LENS PLACEMENT (IOC);  Surgeon: [name], MD;  Location: [facility];  Service: Ophthalmology;  Laterality: Right;  CDE: 6.65  . CATARACT EXTRACTION W/PHACO Left 12/11/2017   Procedure: CATARACT EXTRACTION PHACO AND INTRAOCULAR LENS PLACEMENT ([facility]);  Surgeon: [name], MD;  Location: [facility];  Service: Ophthalmology;  Laterality: Left;  CDE: 5.28  . COLONOSCOPY  06/18/2012   Procedure: COLONOSCOPY;  Surgeon: [name], MD;  [facility];  Service: Endoscopy;  Laterality: N/A;  730  . ESOPHAGOGASTRODUODENOSCOPY    . Hammer toe repair Left   . HEMORRHOID SURGERY    .  THYROIDECTOMY    . TONSILLECTOMY    . TOTAL ABDOMINAL HYSTERECTOMY    . TOTAL SHOULDER ARTHROPLASTY Left 02/06/2017  . TOTAL SHOULDER ARTHROPLASTY Left 02/06/2017   Procedure: TOTAL SHOULDER ARTHROPLASTY;  Surgeon: [name], MD;  Location: [place];  Service: Orthopedics;  Laterality: Left;    Current Outpatient Medications  Medication Sig Dispense Refill  . acetaminophen (TYLENOL) 325 MG tablet Take 650 mg by mouth every 6 (six) hours as needed for moderate pain or headache.     . ALPRAZolam (XANAX) 0.25 MG tablet Take 0.25 mg by mouth daily as needed for anxiety.    Marland Kitchen apixaban (ELIQUIS) 5 MG TABS tablet Take 1 tablet (5 mg total) by mouth 2 (two) times daily. 60 tablet 0  . Calcium Carb-Cholecalciferol (CALCIUM 600 + D PO) Take 2 tablets daily by mouth.    . chlorthalidone (HYGROTON) 25 MG tablet Take 25 mg by mouth daily.    . citalopram (CELEXA) 20 MG tablet Take 20 mg by mouth every morning.     . conjugated estrogens (PREMARIN) vaginal cream Place 1 Applicatorful vaginally daily.     Marland Kitchen diltiazem (CARDIZEM CD) 240 MG 24 hr capsule Take 240 mg by mouth daily.    Marland Kitchen levothyroxine (SYNTHROID, LEVOTHROID) 75 MCG tablet Take 75 mcg by mouth every morning.     Marland Kitchen MAGNESIUM PO Take 2 tablets by mouth every morning.     . Multiple Vitamins-Minerals (MULTIVITAMIN WITH MINERALS) tablet Take 1 tablet daily by  mouth.    . polyethylene glycol (MIRALAX / GLYCOLAX) packet Take 17 g daily as needed by mouth for mild constipation.    Marland Kitchen Propylene Glycol (SYSTANE BALANCE) 0.6 % SOLN Place 1 drop into both eyes 2 (two) times daily.     . simvastatin (ZOCOR) 10 MG tablet Take 10 mg by mouth daily.     No current facility-administered medications for this visit.    Allergies:  Latex; Penicillins; Sulfa antibiotics; and Tramadol   Social History: The patient  reports that she has never smoked. She has never used smokeless tobacco. She reports that she does not drink alcohol or use drugs.   ROS:  Please  see the history of present illness. Otherwise, complete review of systems is positive for left knee pain.  All other systems are reviewed and negative.   Physical Exam: VS:  BP (!) 158/88   Pulse 67   Ht 5\' 3"  (1.6 m)   Wt 180 lb (81.6 kg)   SpO2 98%   BMI 31.89 kg/m , BMI Body mass index is 31.89 kg/m.  Wt Readings from Last 3 Encounters:  12/24/18 180 lb (81.6 kg)  11/15/18 190 lb 4.1 oz (86.3 kg)  12/11/17 186 lb (84.4 kg)    General: Elderly woman, appears comfortable at rest. HEENT: Conjunctiva and lids normal, oropharynx clear. Neck: Supple, no elevated JVP or carotid bruits, no thyromegaly. Lungs: Clear to auscultation, nonlabored breathing at rest. Cardiac: Regular rate and rhythm, no S3, 2/6 systolic murmur. Abdomen: Soft, nontender, bowel sounds present. Extremities: No pitting edema, distal pulses 2+. Skin: Warm and dry. Musculoskeletal: No kyphosis. Neuropsychiatric: Alert and oriented x3, affect grossly appropriate.  ECG: I personally reviewed the tracing from 11/13/2018 which showed rapid atrial fibrillation.  Repolarization abnormalities.  Recent Labwork: 11/13/2018: ALT 28; AST 36; TSH 1.081 11/14/2018: Hemoglobin 12.9; Platelets 208 11/15/2018: BUN 11; Creatinine, Ser 0.58; Magnesium 1.9; Potassium 3.5; Sodium 140     Component Value Date/Time   CHOL 117 11/15/2018 0413   TRIG 102 11/15/2018 0413   HDL 39 (L) 11/15/2018 0413   CHOLHDL 3.0 11/15/2018 0413   VLDL 20 11/15/2018 0413   LDLCALC 58 11/15/2018 0413    Other Studies Reviewed Today:  Echocardiogram 11/14/2018: Study Conclusions  - Left ventricle: The cavity size was normal. Wall thickness was   increased in a pattern of moderate LVH. Systolic function was   normal. The estimated ejection fraction was in the range of 55%   to 60%. Wall motion was normal; there were no regional wall   motion abnormalities. Features are consistent with a pseudonormal   left ventricular filling pattern, with  concomitant abnormal   relaxation and increased filling pressure (grade 2 diastolic   dysfunction).  Impressions:  - Normal LV systolic function; moderate diastolic dysfunction;   moderate LVH.  Assessment and Plan:  1.  Paroxysmal atrial fibrillation, CHADSVASC score of 3.  Continue Eliquis for stroke prophylaxis.  She is on Cardizem CD for heart rate control.  Follow-up in 6 months with CBC and BMET.  2.  Essential hypertension, also on chlorthalidone with follow-up per Dr. Woody Seller.  3.  Mixed hyperlipidemia on Zocor.  Last LDL 58.  4.  Hypothyroidism on Synthroid.  TSH normal in December 2019.  Current medicines were reviewed with the patient today.  Disposition: Follow-up in 6 months.  Signed, Satira Sark, MD, [facility] 12/24/2018 3:05 PM    [facility] at [place], [place], [place]  [place] Phone: [phone]; Fax: [phone]

## 2018-12-27 DIAGNOSIS — Z1331 Encounter for screening for depression: Secondary | ICD-10-CM | POA: Diagnosis not present

## 2018-12-27 DIAGNOSIS — E7800 Pure hypercholesterolemia, unspecified: Secondary | ICD-10-CM | POA: Diagnosis not present

## 2018-12-27 DIAGNOSIS — R413 Other amnesia: Secondary | ICD-10-CM | POA: Diagnosis not present

## 2018-12-27 DIAGNOSIS — E039 Hypothyroidism, unspecified: Secondary | ICD-10-CM | POA: Diagnosis not present

## 2018-12-27 DIAGNOSIS — I10 Essential (primary) hypertension: Secondary | ICD-10-CM | POA: Diagnosis not present

## 2018-12-27 DIAGNOSIS — Z299 Encounter for prophylactic measures, unspecified: Secondary | ICD-10-CM | POA: Diagnosis not present

## 2018-12-27 DIAGNOSIS — R5383 Other fatigue: Secondary | ICD-10-CM | POA: Diagnosis not present

## 2018-12-27 DIAGNOSIS — E559 Vitamin D deficiency, unspecified: Secondary | ICD-10-CM | POA: Diagnosis not present

## 2018-12-27 DIAGNOSIS — Z6831 Body mass index (BMI) 31.0-31.9, adult: Secondary | ICD-10-CM | POA: Diagnosis not present

## 2018-12-27 DIAGNOSIS — Z7189 Other specified counseling: Secondary | ICD-10-CM | POA: Diagnosis not present

## 2018-12-27 DIAGNOSIS — Z1211 Encounter for screening for malignant neoplasm of colon: Secondary | ICD-10-CM | POA: Diagnosis not present

## 2018-12-27 DIAGNOSIS — Z0000 Encounter for general adult medical examination without abnormal findings: Secondary | ICD-10-CM | POA: Diagnosis not present

## 2018-12-27 DIAGNOSIS — Z1339 Encounter for screening examination for other mental health and behavioral disorders: Secondary | ICD-10-CM | POA: Diagnosis not present

## 2018-12-27 DIAGNOSIS — Z79899 Other long term (current) drug therapy: Secondary | ICD-10-CM | POA: Diagnosis not present

## 2018-12-29 ENCOUNTER — Encounter: Payer: Self-pay | Admitting: Pharmacist

## 2018-12-29 ENCOUNTER — Telehealth: Payer: Self-pay | Admitting: Pharmacist

## 2018-12-29 NOTE — Telephone Encounter (Signed)
Received request from [facility] for need to hold Eliquis for procedure for 3-5 days prior to procedure.  Patient with diagnosis of Afib on Eliquis for anticoagulation.    Procedure: Endoscopic DCR with tube insertion and ectropion repair left eye Date of procedure: 02/02/19  CHADS2-VASc score of  3 (CHF, HTN, AGE, DM2, stroke/tia x 2, CAD, AGE, female)  CrCl 167ml/min  Per office protocol, patient can hold Eliquis for 3 days prior to procedure.    This was faxed back to number provided. [phone].

## 2018-12-29 NOTE — Progress Notes (Signed)
This encounter was created in error - please disregard.

## 2018-12-31 DIAGNOSIS — I10 Essential (primary) hypertension: Secondary | ICD-10-CM | POA: Diagnosis not present

## 2018-12-31 DIAGNOSIS — Z6831 Body mass index (BMI) 31.0-31.9, adult: Secondary | ICD-10-CM | POA: Diagnosis not present

## 2018-12-31 DIAGNOSIS — Z299 Encounter for prophylactic measures, unspecified: Secondary | ICD-10-CM | POA: Diagnosis not present

## 2018-12-31 DIAGNOSIS — R413 Other amnesia: Secondary | ICD-10-CM | POA: Diagnosis not present

## 2019-01-06 DIAGNOSIS — L988 Other specified disorders of the skin and subcutaneous tissue: Secondary | ICD-10-CM | POA: Diagnosis not present

## 2019-01-06 DIAGNOSIS — N952 Postmenopausal atrophic vaginitis: Secondary | ICD-10-CM | POA: Diagnosis not present

## 2019-01-11 ENCOUNTER — Ambulatory Visit: Payer: No Typology Code available for payment source | Admitting: Cardiology

## 2019-02-02 DIAGNOSIS — H04202 Unspecified epiphora, left lacrimal gland: Secondary | ICD-10-CM | POA: Diagnosis not present

## 2019-02-02 DIAGNOSIS — H04552 Acquired stenosis of left nasolacrimal duct: Secondary | ICD-10-CM | POA: Diagnosis not present

## 2019-02-02 DIAGNOSIS — H02135 Senile ectropion of left lower eyelid: Secondary | ICD-10-CM | POA: Diagnosis not present

## 2019-02-02 DIAGNOSIS — E7800 Pure hypercholesterolemia, unspecified: Secondary | ICD-10-CM | POA: Diagnosis not present

## 2019-02-02 DIAGNOSIS — Z79899 Other long term (current) drug therapy: Secondary | ICD-10-CM | POA: Diagnosis not present

## 2019-02-02 DIAGNOSIS — H02105 Unspecified ectropion of left lower eyelid: Secondary | ICD-10-CM | POA: Diagnosis not present

## 2019-02-02 DIAGNOSIS — I4891 Unspecified atrial fibrillation: Secondary | ICD-10-CM | POA: Diagnosis not present

## 2019-02-02 DIAGNOSIS — K219 Gastro-esophageal reflux disease without esophagitis: Secondary | ICD-10-CM | POA: Diagnosis not present

## 2019-02-02 DIAGNOSIS — Z96612 Presence of left artificial shoulder joint: Secondary | ICD-10-CM | POA: Diagnosis not present

## 2019-02-02 DIAGNOSIS — I10 Essential (primary) hypertension: Secondary | ICD-10-CM | POA: Diagnosis not present

## 2019-02-02 DIAGNOSIS — Z7901 Long term (current) use of anticoagulants: Secondary | ICD-10-CM | POA: Diagnosis not present

## 2019-03-07 ENCOUNTER — Telehealth: Payer: Self-pay | Admitting: Cardiology

## 2019-03-07 ENCOUNTER — Other Ambulatory Visit: Payer: Self-pay | Admitting: *Deleted

## 2019-03-07 DIAGNOSIS — Z79899 Other long term (current) drug therapy: Secondary | ICD-10-CM

## 2019-03-07 DIAGNOSIS — I480 Paroxysmal atrial fibrillation: Secondary | ICD-10-CM

## 2019-03-07 NOTE — Telephone Encounter (Signed)
Patient called wanting to know if she needs to have labs before her visit in July with [name].

## 2019-03-07 NOTE — Telephone Encounter (Signed)
Patient informed that she needs lab work before her visit. Lab orders mailed to patient. Labs will be done at [facility] lab.

## 2019-03-15 DIAGNOSIS — M1712 Unilateral primary osteoarthritis, left knee: Secondary | ICD-10-CM | POA: Diagnosis not present

## 2019-03-15 DIAGNOSIS — M1711 Unilateral primary osteoarthritis, right knee: Secondary | ICD-10-CM | POA: Diagnosis not present

## 2019-04-26 DIAGNOSIS — M1712 Unilateral primary osteoarthritis, left knee: Secondary | ICD-10-CM | POA: Diagnosis not present

## 2019-04-26 DIAGNOSIS — M25562 Pain in left knee: Secondary | ICD-10-CM | POA: Diagnosis not present

## 2019-05-06 DIAGNOSIS — M25562 Pain in left knee: Secondary | ICD-10-CM | POA: Diagnosis not present

## 2019-05-23 DIAGNOSIS — I4891 Unspecified atrial fibrillation: Secondary | ICD-10-CM | POA: Diagnosis not present

## 2019-05-23 DIAGNOSIS — Z6829 Body mass index (BMI) 29.0-29.9, adult: Secondary | ICD-10-CM | POA: Diagnosis not present

## 2019-05-23 DIAGNOSIS — R159 Full incontinence of feces: Secondary | ICD-10-CM | POA: Diagnosis not present

## 2019-05-23 DIAGNOSIS — I10 Essential (primary) hypertension: Secondary | ICD-10-CM | POA: Diagnosis not present

## 2019-05-23 DIAGNOSIS — Z299 Encounter for prophylactic measures, unspecified: Secondary | ICD-10-CM | POA: Diagnosis not present

## 2019-05-26 DIAGNOSIS — M25561 Pain in right knee: Secondary | ICD-10-CM | POA: Diagnosis not present

## 2019-05-26 DIAGNOSIS — M1712 Unilateral primary osteoarthritis, left knee: Secondary | ICD-10-CM | POA: Diagnosis not present

## 2019-05-26 DIAGNOSIS — M25562 Pain in left knee: Secondary | ICD-10-CM | POA: Diagnosis not present

## 2019-05-30 ENCOUNTER — Encounter (INDEPENDENT_AMBULATORY_CARE_PROVIDER_SITE_OTHER): Payer: Self-pay | Admitting: Internal Medicine

## 2019-05-30 ENCOUNTER — Other Ambulatory Visit: Payer: Self-pay

## 2019-05-30 ENCOUNTER — Ambulatory Visit (INDEPENDENT_AMBULATORY_CARE_PROVIDER_SITE_OTHER): Payer: Medicare Other | Admitting: Internal Medicine

## 2019-05-30 VITALS — BP 161/74 | HR 64 | Temp 97.7°F | Ht 62.5 in | Wt 165.9 lb

## 2019-05-30 DIAGNOSIS — Z79899 Other long term (current) drug therapy: Secondary | ICD-10-CM | POA: Diagnosis not present

## 2019-05-30 DIAGNOSIS — I480 Paroxysmal atrial fibrillation: Secondary | ICD-10-CM | POA: Diagnosis not present

## 2019-05-30 DIAGNOSIS — K625 Hemorrhage of anus and rectum: Secondary | ICD-10-CM

## 2019-05-30 NOTE — Progress Notes (Signed)
   Subjective:    Patient ID: [name], female    DOB: 07-16-1946, 73 y.o.   MRN: [id number]  HPI Referred by Dr. Woody Seller for stool incontinence. She tells me at times she could not have a BM. She says her rectum would tighten up and she would pass blood. Has seen GYN and she had a pelvic, no stool was seen in her vagina. Her symptoms started before November 2019. She says she feels like she doesn't empty her colon completely. Sometimes she has fecal incontinence.  She has not tried anything for her fecal incontinence. The incontinence occurs frequently. She says in December she weighed 192. She cut back and tries to watch her sweets. Her appetite is okay.  She has a BM usually once a day. Sometimes she will skip a day.  If she skips a day, she will take the Miralax.   Her last colonoscopy was in 2013 for intermittent hematochezia and constipation despite taking Miralax.  Ileal and colonic erosions most likely secondary to NSAID use. Biopsy taken from transverse colon erosions for routine histology. 4 mm polyp ablated via cold biopsy from sigmoid colon. Sigmoid colon diverticulosis. External hemorrhoids. Biopsy: Ulcerated colonic mucosa with intramucosal Lymphoid aggregates. No evidence of viral cytopathic changes, chronicity, dysplasia or malignancy.  Colon polyp: sigmoid. Prolapse type polp. No evidence of adenomatous changes or malignancy.  Colonic injuries most likely secondary to NSAIDs ( per notes).  Hx of atrial fib and maintained on Eliquis BID  Review of Systems     Objective:   Physical Exam Blood pressure (!) 161/74, pulse 64, temperature 97.7 F (36.5 C), height 5' 2.5" (1.588 m), weight 165 lb 14.4 oz (75.3 kg). Alert and oriented. Skin warm and dry. Oral mucosa is moist.   . Sclera anicteric, conjunctivae is pink. Thyroid not enlarged. No cervical lymphadenopathy. Lungs clear. Heart regular rate and rhythm.  Abdomen is soft. Bowel sounds are positive. No hepatomegaly. No  abdominal masses felt. No tenderness.  No edema to lower extremities. Rectal: soft stool in rectum. Guaiac negative. Good sphincter tone.  No masses felt.         Assessment & Plan:  Fecal incontinence. Stop Miralax. Start Benefiber daily. OV in 4 weeks. 3 stool cards home with patient.  Stool diary.

## 2019-05-30 NOTE — Patient Instructions (Addendum)
Start Benefiber daily. Stop the Miralax. Stool diary for a month.  3 stool cards home with patient.

## 2019-05-31 LAB — BASIC METABOLIC PANEL
BUN: 19 mg/dL (ref 7–25)
CO2: 32 mmol/L (ref 20–32)
Calcium: 10.9 mg/dL — ABNORMAL HIGH (ref 8.6–10.4)
Chloride: 101 mmol/L (ref 98–110)
Creat: 0.77 mg/dL (ref 0.60–0.93)
Glucose, Bld: 102 mg/dL (ref 65–139)
Potassium: 3.6 mmol/L (ref 3.5–5.3)
Sodium: 139 mmol/L (ref 135–146)

## 2019-05-31 LAB — CBC
HCT: 46.6 % — ABNORMAL HIGH (ref 35.0–45.0)
Hemoglobin: 15.6 g/dL — ABNORMAL HIGH (ref 11.7–15.5)
MCH: 30.9 pg (ref 27.0–33.0)
MCHC: 33.5 g/dL (ref 32.0–36.0)
MCV: 92.3 fL (ref 80.0–100.0)
MPV: 11.4 fL (ref 7.5–12.5)
Platelets: 288 10*3/uL (ref 140–400)
RBC: 5.05 10*6/uL (ref 3.80–5.10)
RDW: 12.2 % (ref 11.0–15.0)
WBC: 12.5 10*3/uL — ABNORMAL HIGH (ref 3.8–10.8)

## 2019-06-01 ENCOUNTER — Telehealth: Payer: Self-pay | Admitting: *Deleted

## 2019-06-01 NOTE — Telephone Encounter (Signed)
-----   Message from Satira Sark, MD sent at 05/31/2019  9:15 AM EDT ----- Results reviewed.  Renal function and potassium are normal, hemoglobin 15.6.  No changes in current regimen.

## 2019-06-01 NOTE — Telephone Encounter (Signed)
-----   Message from Satira Sark, MD sent at 05/31/2019  7:49 AM EDT ----- Results reviewed.  Hemoglobin normal at 15.6.  Renal function normal.  Continue with current medications and follow-up.

## 2019-06-01 NOTE — Telephone Encounter (Signed)
Patient informed. Copy sent to PCP °

## 2019-06-23 ENCOUNTER — Telehealth: Payer: Self-pay | Admitting: *Deleted

## 2019-06-23 NOTE — Telephone Encounter (Signed)
Patient verbally consented for tele-health visits with [facility] and understands that her insurance company will be billed for the encounter.  Aware to have vitals available   

## 2019-06-24 ENCOUNTER — Encounter: Payer: Self-pay | Admitting: Cardiology

## 2019-06-24 ENCOUNTER — Telehealth (INDEPENDENT_AMBULATORY_CARE_PROVIDER_SITE_OTHER): Payer: Medicare Other | Admitting: Cardiology

## 2019-06-24 VITALS — BP 133/72 | HR 64 | Ht 63.0 in | Wt 160.0 lb

## 2019-06-24 DIAGNOSIS — I480 Paroxysmal atrial fibrillation: Secondary | ICD-10-CM

## 2019-06-24 DIAGNOSIS — I10 Essential (primary) hypertension: Secondary | ICD-10-CM

## 2019-06-24 DIAGNOSIS — E782 Mixed hyperlipidemia: Secondary | ICD-10-CM

## 2019-06-24 DIAGNOSIS — E039 Hypothyroidism, unspecified: Secondary | ICD-10-CM

## 2019-06-24 NOTE — Progress Notes (Signed)
Virtual Visit via Telephone Note   This visit type was conducted due to national recommendations for restrictions regarding the COVID-19 Pandemic (e.g. social distancing) in an effort to limit this patient's exposure and mitigate transmission in our community.  Due to her co-morbid illnesses, this patient is at least at moderate risk for complications without adequate follow up.  This format is felt to be most appropriate for this patient at this time.  The patient did not have access to video technology/had technical difficulties with video requiring transitioning to audio format only (telephone).  All issues noted in this document were discussed and addressed.  No physical exam could be performed with this format.  Please refer to the patient's chart for her  consent to telehealth for [facility].   Date:  06/24/2019   ID:  [name], DOB Apr 08, 1946, MRN [id number]  Patient Location: Home Provider Location: Office  PCP:  [name], MD  Cardiologist:  Rozann Lesches, MD Electrophysiologist:  None   Evaluation Performed:  Follow-Up Visit  Chief Complaint:   Cardiac follow-up  History of Present Illness:    [name] is a 73 y.o. female last seen in January.  She did not have video access and we spoke by phone today.  She has had no progressive palpitations or chest pain since last assessment.  Recent follow-up lab work is outlined below.  She does not report any major change in stool or major bleeding problems, hemoglobin is normal.  I reviewed her medications which are outlined below.  She continues on Cardizem CD along with Eliquis.  The patient does not have symptoms concerning for COVID-19 infection (fever, chills, cough, or new shortness of breath).  She states that she wears a mask when she goes out.   Past Medical History:  Diagnosis Date   Anxiety    Arthritis    Atrial fibrillation [facility])    Documented December 2019   Depression    GERD  (gastroesophageal reflux disease)    Hyperlipidemia    Hypertension    Hypothyroidism    Past Surgical History:  Procedure Laterality Date   APPENDECTOMY     BUNIONECTOMY Bilateral    CATARACT EXTRACTION W/PHACO Right 10/09/2017   Procedure: CATARACT EXTRACTION PHACO AND INTRAOCULAR LENS PLACEMENT ([place]);  Surgeon: [name], MD;  Location: [facility];  Service: Ophthalmology;  Laterality: Right;  CDE: 6.65   CATARACT EXTRACTION W/PHACO Left 12/11/2017   Procedure: CATARACT EXTRACTION PHACO AND INTRAOCULAR LENS PLACEMENT (IOC);  Surgeon: [name], MD;  Location: [facility];  Service: Ophthalmology;  Laterality: Left;  CDE: 5.28   COLONOSCOPY  06/18/2012   Procedure: COLONOSCOPY;  Surgeon: [name], MD;  [facility];  Service: Endoscopy;  Laterality: N/A;  730   ESOPHAGOGASTRODUODENOSCOPY     Hammer toe repair Left    HEMORRHOID SURGERY     THYROIDECTOMY     TONSILLECTOMY     TOTAL ABDOMINAL HYSTERECTOMY     TOTAL SHOULDER ARTHROPLASTY Left 02/06/2017   TOTAL SHOULDER ARTHROPLASTY Left 02/06/2017   Procedure: TOTAL SHOULDER ARTHROPLASTY;  Surgeon: [name], MD;  Location: [place];  Service: Orthopedics;  Laterality: Left;     Current Meds  Medication Sig   acetaminophen (TYLENOL) 325 MG tablet Take 650 mg by mouth every 6 (six) hours as needed for moderate pain or headache.    ALPRAZolam (XANAX) 0.25 MG tablet Take 0.25 mg by mouth daily as needed for anxiety.   apixaban (ELIQUIS)  5 MG TABS tablet Take 1 tablet (5 mg total) by mouth 2 (two) times daily.   Calcium Carb-Cholecalciferol (CALCIUM 600 + D PO) Take 2 tablets daily by mouth.   chlorthalidone (HYGROTON) 25 MG tablet Take 25 mg by mouth daily.   citalopram (CELEXA) 20 MG tablet Take 20 mg by mouth every morning.    diltiazem (CARDIZEM CD) 240 MG 24 hr capsule Take 240 mg by mouth daily.   levothyroxine (SYNTHROID, LEVOTHROID) 75 MCG tablet Take 75 mcg by mouth every morning.     MAGNESIUM PO Take 2 tablets by mouth every morning.    Multiple Vitamins-Minerals (MULTIVITAMIN WITH MINERALS) tablet Take 1 tablet daily by mouth.   polyethylene glycol (MIRALAX / GLYCOLAX) packet Take 17 g daily as needed by mouth for mild constipation.   Propylene Glycol (SYSTANE BALANCE) 0.6 % SOLN Place 1 drop into both eyes 2 (two) times daily.    simvastatin (ZOCOR) 10 MG tablet Take 10 mg by mouth daily.     Allergies:   Latex, Penicillins, Sulfa antibiotics, and Tramadol   Social History   Tobacco Use   Smoking status: Never Smoker   Smokeless tobacco: Never Used  Substance Use Topics   Alcohol use: No    Alcohol/week: 0.0 standard drinks   Drug use: No     Family Hx: The patient's family history is negative for Colon cancer.  ROS:   Please see the history of present illness. All other systems reviewed and are negative.   Prior CV studies:   The following studies were reviewed today:  Echocardiogram 11/14/2018: Study Conclusions  - Left ventricle: The cavity size was normal. Wall thickness was increased in a pattern of moderate LVH. Systolic function was normal. The estimated ejection fraction was in the range of 55% to 60%. Wall motion was normal; there were no regional wall motion abnormalities. Features are consistent with a pseudonormal left ventricular filling pattern, with concomitant abnormal relaxation and increased filling pressure (grade 2 diastolic dysfunction).  Impressions:  - Normal LV systolic function; moderate diastolic dysfunction; moderate LVH.  Labs/Other Tests and Data Reviewed:    EKG:  An ECG dated 11/13/2018 was personally reviewed today and demonstrated:  Rapid atrial fibrillation with repolarization abnormalities.  Recent Labs: 11/13/2018: ALT 28; TSH 1.081 11/15/2018: Magnesium 1.9 05/30/2019: BUN 19; Creat 0.77; Hemoglobin 15.6; Platelets 288; Potassium 3.6; Sodium 139   Recent Lipid Panel Lab  Results  Component Value Date/Time   CHOL 117 11/15/2018 04:13 AM   TRIG 102 11/15/2018 04:13 AM   HDL 39 (L) 11/15/2018 04:13 AM   CHOLHDL 3.0 11/15/2018 04:13 AM   LDLCALC 58 11/15/2018 04:13 AM    Wt Readings from Last 3 Encounters:  06/24/19 160 lb (72.6 kg)  05/30/19 165 lb 14.4 oz (75.3 kg)  12/24/18 180 lb (81.6 kg)     Objective:    Vital Signs:  BP 133/72    Pulse 64    Ht 5\' 3"  (1.6 m)    Wt 160 lb (72.6 kg)    BMI 28.34 kg/m    Patient spoke in full sentences, not short of breath. No audible wheezing or coughing. Speech pattern normal.  ASSESSMENT & PLAN:    1.  Paroxysmal atrial fibrillation with CHADSVASC score of 3.  She is doing well without progressive palpitations.  Continue current dose of Cardizem CD along with Eliquis.  Follow-up CBC and BMET for next visit.  2.  Essential hypertension, systolic is in the 818E today.  No changes made.  3.  Mixed hyperlipidemia on Zocor.  Keep follow-up with PCP.  4.  Hypothyroidism on Synthroid.  Keep follow-up with PCP.  COVID-19 Education: The signs and symptoms of COVID-19 were discussed with the patient and how to seek care for testing (follow up with PCP or arrange E-visit).  The importance of social distancing was discussed today.  Time:   Today, I have spent 5 minutes with the patient with telehealth technology discussing the above problems.     Medication Adjustments/Labs and Tests Ordered: Current medicines are reviewed at length with the patient today.  Concerns regarding medicines are outlined above.   Tests Ordered: No orders of the defined types were placed in this encounter.   Medication Changes: No orders of the defined types were placed in this encounter.   Follow Up:  In Person 6 months in the [place] office.  Signed, Rozann Lesches, MD  06/24/2019 10:13 AM    [place]

## 2019-06-24 NOTE — Patient Instructions (Signed)

## 2019-06-27 ENCOUNTER — Telehealth: Payer: Self-pay | Admitting: Cardiology

## 2019-06-27 ENCOUNTER — Telehealth (INDEPENDENT_AMBULATORY_CARE_PROVIDER_SITE_OTHER): Payer: Self-pay | Admitting: *Deleted

## 2019-06-27 ENCOUNTER — Other Ambulatory Visit (INDEPENDENT_AMBULATORY_CARE_PROVIDER_SITE_OTHER): Payer: Self-pay | Admitting: *Deleted

## 2019-06-27 ENCOUNTER — Other Ambulatory Visit: Payer: Self-pay

## 2019-06-27 ENCOUNTER — Ambulatory Visit (INDEPENDENT_AMBULATORY_CARE_PROVIDER_SITE_OTHER): Payer: Medicare Other | Admitting: Internal Medicine

## 2019-06-27 ENCOUNTER — Encounter (INDEPENDENT_AMBULATORY_CARE_PROVIDER_SITE_OTHER): Payer: Self-pay | Admitting: *Deleted

## 2019-06-27 ENCOUNTER — Encounter (INDEPENDENT_AMBULATORY_CARE_PROVIDER_SITE_OTHER): Payer: Self-pay | Admitting: Internal Medicine

## 2019-06-27 VITALS — BP 162/78 | HR 62 | Temp 98.4°F | Resp 18 | Ht 62.5 in | Wt 164.4 lb

## 2019-06-27 DIAGNOSIS — K625 Hemorrhage of anus and rectum: Secondary | ICD-10-CM

## 2019-06-27 DIAGNOSIS — R634 Abnormal weight loss: Secondary | ICD-10-CM | POA: Diagnosis not present

## 2019-06-27 NOTE — Patient Instructions (Signed)
Continue high-fiber diet. Pelvic floor strengthening exercises as instructed 2-3 times a day. Colonoscopy to be scheduled. Will obtain copy of recent blood work from Dr. Marcial Pacas office

## 2019-06-27 NOTE — H&P (View-Only) (Signed)
Presenting complaint;  Rectal bleeding. Patient says she is passing stool through her vagina.  Database and subjective:  Patient is 73 year old Caucasian female who was last seen on 05/30/2019 for fecal incontinence and having difficulty with defecation.  Patient was advised to stop polyethylene glycol and begin fiber supplement.  She was given 3 Hemoccults. Patient now returns for scheduled visit. Patient states that she is not having fecal incontinence anymore but she reports 2 episodes of rectal bleeding since her last office visit.  Both of these episodes occurred with a bowel movement when she passed small amount of fresh blood.  She continues to complain of difficulty with defecation.  She feels her rectum closes and she keeps straining it eventually she is able to pass a stool.  She has formed stool at least 4-5 times a week.  She continues to feel that she is passing stool through her vagina on she has to keep cleaning.  She was seen by Dr. Barrie Dunker and examination was normal.  She reports that she has had this symptom for 2 years but it has just gotten worse lately.  Similarly she has had rectal bleeding off and on for more than 6 months but is been occurring more frequently since she has been on Eliquis which was begun in January for new onset of atrial fibrillation.  She denies abdominal pain melena.  She has lost 24 pounds in the last 7 months.  Review of her weight record reveals that she weighed 288 pounds in December 2012.  Today she weighed 164 pounds.  Her weight has decreased only by 1 pound since her last visit.  Patient does report going on a strict diet after she left the hospital in December.  She says most of her weight loss occurred during that time..  She denies fever chills or night sweats.  She states she had blood work in February this year and she believes her TSH was normal.  Thyroid dose has not been changed in over 2 years. Her last colonoscopy was in 2013.    Current  Medications: Outpatient Encounter Medications as of 06/27/2019  Medication Sig  . acetaminophen (TYLENOL) 325 MG tablet Take 650 mg by mouth every 6 (six) hours as needed for moderate pain or headache.   . ALPRAZolam (XANAX) 0.25 MG tablet Take 0.25 mg by mouth daily as needed for anxiety.  Marland Kitchen apixaban (ELIQUIS) 5 MG TABS tablet Take 1 tablet (5 mg total) by mouth 2 (two) times daily.  . Calcium Carb-Cholecalciferol (CALCIUM 600 + D PO) Take 2 tablets daily by mouth.  . chlorthalidone (HYGROTON) 25 MG tablet Take 25 mg by mouth daily.  . citalopram (CELEXA) 20 MG tablet Take 20 mg by mouth every morning.   . diltiazem (CARDIZEM CD) 240 MG 24 hr capsule Take 240 mg by mouth daily.  Marland Kitchen levothyroxine (SYNTHROID, LEVOTHROID) 75 MCG tablet Take 75 mcg by mouth every morning.   Marland Kitchen MAGNESIUM PO Take 2 tablets by mouth every morning.   . Multiple Vitamins-Minerals (MULTIVITAMIN WITH MINERALS) tablet Take 1 tablet daily by mouth.  . polyethylene glycol (MIRALAX / GLYCOLAX) packet Take 17 g daily as needed by mouth for mild constipation.  Marland Kitchen Propylene Glycol (SYSTANE BALANCE) 0.6 % SOLN Place 1 drop into both eyes 2 (two) times daily.   . simvastatin (ZOCOR) 10 MG tablet Take 10 mg by mouth daily.  . vitamin C (ASCORBIC ACID) 500 MG tablet Take 500 mg by mouth daily.   No facility-administered encounter  medications on file as of 06/27/2019.    Past Medical History:  Diagnosis Date  . Anxiety   . Arthritis   . Atrial fibrillation [facility])    Documented December 2019  . Depression   . GERD (gastroesophageal reflux disease)   . Hyperlipidemia   . Hypertension   . Hypothyroidism    Past Surgical History:  Procedure Laterality Date  . APPENDECTOMY    . BUNIONECTOMY Bilateral   . CATARACT EXTRACTION W/PHACO Right 10/09/2017   Procedure: CATARACT EXTRACTION PHACO AND INTRAOCULAR LENS PLACEMENT (IOC);  Surgeon: [name], MD;  Location: [facility];  Service: Ophthalmology;  Laterality: Right;  CDE: 6.65  .  CATARACT EXTRACTION W/PHACO Left 12/11/2017   Procedure: CATARACT EXTRACTION PHACO AND INTRAOCULAR LENS PLACEMENT (IOC);  Surgeon: [name], MD;  Location: [facility];  Service: Ophthalmology;  Laterality: Left;  CDE: 5.28  . COLONOSCOPY  06/18/2012   Procedure: COLONOSCOPY;  Surgeon: [name], MD;  Location: [facility];  Service: Endoscopy;  Laterality: N/A;  730  . ESOPHAGOGASTRODUODENOSCOPY    . Hammer toe repair Left   . HEMORRHOID SURGERY    . THYROIDECTOMY    . TONSILLECTOMY    . TOTAL ABDOMINAL HYSTERECTOMY    . TOTAL SHOULDER ARTHROPLASTY Left 02/06/2017  . TOTAL SHOULDER ARTHROPLASTY Left 02/06/2017   Procedure: TOTAL SHOULDER ARTHROPLASTY;  Surgeon: [name], MD;  Location: [place];  Service: Orthopedics;  Laterality: Left;      Objective: Blood pressure (!) 162/78, pulse 62, temperature 98.4 F (36.9 C), temperature source Oral, resp. rate 18, height 5' 2.5" (1.588 m), weight 164 lb 6.4 oz (74.6 kg). Patient is alert and in no acute distress. Conjunctiva is pink. Sclera is nonicteric Oropharyngeal mucosa is normal. No neck masses or thyromegaly noted. Cardiac exam with regular rhythm normal S1 and S2. No murmur or gallop noted. Lungs are clear to auscultation. Abdomen is symmetrical.  She has horizontal scar across suprapubic region as well as lower midline scar.  Bowel sounds are normal.  On palpation abdomen is soft and nontender with organomegaly or masses.  Rectal examination reveals no external abnormality.  Digital exam reveals normal sphincter tone.  She was instructed on how to squeeze rectal sphincter and perineal muscles.  She is able to do this quite effectively.  Her stool is guaiac negative. No LE edema or clubbing noted.  Labs/studies Results:  CBC Latest Ref Rng & Units 05/30/2019 11/14/2018 11/13/2018  WBC 3.8 - 10.8 Thousand/uL 12.5(H) 6.6 9.5  Hemoglobin 11.7 - 15.5 g/dL 15.6(H) 12.9 14.6  Hematocrit 35.0 - 45.0 % 46.6(H) 40.6 45.7  Platelets  140 - 400 Thousand/uL 288 208 247    CMP Latest Ref Rng & Units 05/30/2019 11/15/2018 11/14/2018  Glucose 65 - 139 mg/dL 102 94 96  BUN 7 - 25 mg/dL '19 11 14  ' Creatinine 0.60 - 0.93 mg/dL 0.77 0.58 0.60  Sodium 135 - 146 mmol/L 139 140 141  Potassium 3.5 - 5.3 mmol/L 3.6 3.5 3.3(L)  Chloride 98 - 110 mmol/L 101 110 111  CO2 20 - 32 mmol/L 32 25 25  Calcium 8.6 - 10.4 mg/dL 10.9(H) 8.9 8.6(L)  Total Protein 6.5 - 8.1 g/dL - - -  Total Bilirubin 0.3 - 1.2 mg/dL - - -  Alkaline Phos 38 - 126 U/L - - -  AST 15 - 41 U/L - - -  ALT 0 - 44 U/L - - -    Hepatic Function Latest Ref Rng & Units 11/13/2018  02/16/2017 08/05/2013  Total Protein 6.5 - 8.1 g/dL 6.7 6.0(L) 6.6  Albumin 3.5 - 5.0 g/dL 3.9 3.0(L) 4.3  AST 15 - 41 U/L 36 26 15  ALT 0 - 44 U/L '28 23 13  ' Alk Phosphatase 38 - 126 U/L 69 126 76  Total Bilirubin 0.3 - 1.2 mg/dL 0.8 0.7 1.2  Bilirubin, Direct 0.0 - 0.3 mg/dL - - 0.2    Hemoccults.  First 2 are negative but the third 1 is positive.  Assessment:  #1.  Rectal bleeding.  Suspect hemorrhoidal bleeding.  Her stool is heme positive.  Need to rule out other diagnoses such as polyps or neoplasm.  #2.  Defecation issues.  Her symptoms suggest rectal spasm and rectovaginal fistula but recent exam by Dr. Barrie Dunker was normal.  Therefore it may be spurious due to soiling of vagina at the time of defecation.  #3.  Weight loss.  She has lost 24 pounds in the last 7 months.  She only has lost 1 pound in 4 weeks.  She does not have any worrisome symptoms.  Weight loss may have occurred in the first 3 months of this year because of dietary changes.  Will continue to monitor her weight loss and make sure that she is not thyrotoxic.   Plan:  Diagnostic colonoscopy in near future. She needs to be off Eliquis for 2 days; will get clearance from Dr. Johnny Bridge. Patient instructed to do Kegel exercise 2-3 times a day for 10-15 squeezes each time. Request copy of recent blood work for  review. Further recommendations to follow.

## 2019-06-27 NOTE — Telephone Encounter (Signed)
   Diagnosis:    Result(s)   Card 1: Negative: 06/09/19    Card 2: Negative:06/15/19   Card 3: Positive: 06/20/19    Completed by: Gretta Began ,LPN   HEMOCCULT SENSA DEVELOPER: LOT#:  [id number] EXPIRATION DATE: 2022-06   HEMOCCULT SENSA CARD:  LOT#:  [id number] EXPIRATION DATE: 05/21   CARD CONTROL RESULTS:  POSITIVE: Positive NEGATIVE: Negative    ADDITIONAL COMMENTS: Patient is present for OV with Dr.Rehman. She is aware of the results.

## 2019-06-27 NOTE — Progress Notes (Signed)
Presenting complaint;  Rectal bleeding. Patient says she is passing stool through her vagina.  Database and subjective:  Patient is 73 year old Caucasian female who was last seen on 05/30/2019 for fecal incontinence and having difficulty with defecation.  Patient was advised to stop polyethylene glycol and begin fiber supplement.  She was given 3 Hemoccults. Patient now returns for scheduled visit. Patient states that she is not having fecal incontinence anymore but she reports 2 episodes of rectal bleeding since her last office visit.  Both of these episodes occurred with a bowel movement when she passed small amount of fresh blood.  She continues to complain of difficulty with defecation.  She feels her rectum closes and she keeps straining it eventually she is able to pass a stool.  She has formed stool at least 4-5 times a week.  She continues to feel that she is passing stool through her vagina on she has to keep cleaning.  She was seen by Dr. Barrie Dunker and examination was normal.  She reports that she has had this symptom for 2 years but it has just gotten worse lately.  Similarly she has had rectal bleeding off and on for more than 6 months but is been occurring more frequently since she has been on Eliquis which was begun in January for new onset of atrial fibrillation.  She denies abdominal pain melena.  She has lost 24 pounds in the last 7 months.  Review of her weight record reveals that she weighed 288 pounds in December 2012.  Today she weighed 164 pounds.  Her weight has decreased only by 1 pound since her last visit.  Patient does report going on a strict diet after she left the hospital in December.  She says most of her weight loss occurred during that time..  She denies fever chills or night sweats.  She states she had blood work in February this year and she believes her TSH was normal.  Thyroid dose has not been changed in over 2 years. Her last colonoscopy was in 2013.    Current  Medications: Outpatient Encounter Medications as of 06/27/2019  Medication Sig  . acetaminophen (TYLENOL) 325 MG tablet Take 650 mg by mouth every 6 (six) hours as needed for moderate pain or headache.   . ALPRAZolam (XANAX) 0.25 MG tablet Take 0.25 mg by mouth daily as needed for anxiety.  Marland Kitchen apixaban (ELIQUIS) 5 MG TABS tablet Take 1 tablet (5 mg total) by mouth 2 (two) times daily.  . Calcium Carb-Cholecalciferol (CALCIUM 600 + D PO) Take 2 tablets daily by mouth.  . chlorthalidone (HYGROTON) 25 MG tablet Take 25 mg by mouth daily.  . citalopram (CELEXA) 20 MG tablet Take 20 mg by mouth every morning.   . diltiazem (CARDIZEM CD) 240 MG 24 hr capsule Take 240 mg by mouth daily.  Marland Kitchen levothyroxine (SYNTHROID, LEVOTHROID) 75 MCG tablet Take 75 mcg by mouth every morning.   Marland Kitchen MAGNESIUM PO Take 2 tablets by mouth every morning.   . Multiple Vitamins-Minerals (MULTIVITAMIN WITH MINERALS) tablet Take 1 tablet daily by mouth.  . polyethylene glycol (MIRALAX / GLYCOLAX) packet Take 17 g daily as needed by mouth for mild constipation.  Marland Kitchen Propylene Glycol (SYSTANE BALANCE) 0.6 % SOLN Place 1 drop into both eyes 2 (two) times daily.   . simvastatin (ZOCOR) 10 MG tablet Take 10 mg by mouth daily.  . vitamin C (ASCORBIC ACID) 500 MG tablet Take 500 mg by mouth daily.   No facility-administered encounter  medications on file as of 06/27/2019.    Past Medical History:  Diagnosis Date  . Anxiety   . Arthritis   . Atrial fibrillation [facility])    Documented December 2019  . Depression   . GERD (gastroesophageal reflux disease)   . Hyperlipidemia   . Hypertension   . Hypothyroidism    Past Surgical History:  Procedure Laterality Date  . APPENDECTOMY    . BUNIONECTOMY Bilateral   . CATARACT EXTRACTION W/PHACO Right 10/09/2017   Procedure: CATARACT EXTRACTION PHACO AND INTRAOCULAR LENS PLACEMENT (IOC);  Surgeon: [name], MD;  Location: [facility];  Service: Ophthalmology;  Laterality: Right;  CDE: 6.65  .  CATARACT EXTRACTION W/PHACO Left 12/11/2017   Procedure: CATARACT EXTRACTION PHACO AND INTRAOCULAR LENS PLACEMENT (IOC);  Surgeon: [name], MD;  Location: [facility];  Service: Ophthalmology;  Laterality: Left;  CDE: 5.28  . COLONOSCOPY  06/18/2012   Procedure: COLONOSCOPY;  Surgeon: [name], MD;  [facility];  Service: Endoscopy;  Laterality: N/A;  730  . ESOPHAGOGASTRODUODENOSCOPY    . Hammer toe repair Left   . HEMORRHOID SURGERY    . THYROIDECTOMY    . TONSILLECTOMY    . TOTAL ABDOMINAL HYSTERECTOMY    . TOTAL SHOULDER ARTHROPLASTY Left 02/06/2017  . TOTAL SHOULDER ARTHROPLASTY Left 02/06/2017   Procedure: TOTAL SHOULDER ARTHROPLASTY;  Surgeon: [name], MD;  Location: [place];  Service: Orthopedics;  Laterality: Left;      Objective: Blood pressure (!) 162/78, pulse 62, temperature 98.4 F (36.9 C), temperature source Oral, resp. rate 18, height 5' 2.5" (1.588 m), weight 164 lb 6.4 oz (74.6 kg). Patient is alert and in no acute distress. Conjunctiva is pink. Sclera is nonicteric Oropharyngeal mucosa is normal. No neck masses or thyromegaly noted. Cardiac exam with regular rhythm normal S1 and S2. No murmur or gallop noted. Lungs are clear to auscultation. Abdomen is symmetrical.  She has horizontal scar across suprapubic region as well as lower midline scar.  Bowel sounds are normal.  On palpation abdomen is soft and nontender with organomegaly or masses.  Rectal examination reveals no external abnormality.  Digital exam reveals normal sphincter tone.  She was instructed on how to squeeze rectal sphincter and perineal muscles.  She is able to do this quite effectively.  Her stool is guaiac negative. No LE edema or clubbing noted.  Labs/studies Results:  CBC Latest Ref Rng & Units 05/30/2019 11/14/2018 11/13/2018  WBC 3.8 - 10.8 Thousand/uL 12.5(H) 6.6 9.5  Hemoglobin 11.7 - 15.5 g/dL 15.6(H) 12.9 14.6  Hematocrit 35.0 - 45.0 % 46.6(H) 40.6 45.7  Platelets  140 - 400 Thousand/uL 288 208 247    CMP Latest Ref Rng & Units 05/30/2019 11/15/2018 11/14/2018  Glucose 65 - 139 mg/dL 102 94 96  BUN 7 - 25 mg/dL '19 11 14  ' Creatinine 0.60 - 0.93 mg/dL 0.77 0.58 0.60  Sodium 135 - 146 mmol/L 139 140 141  Potassium 3.5 - 5.3 mmol/L 3.6 3.5 3.3(L)  Chloride 98 - 110 mmol/L 101 110 111  CO2 20 - 32 mmol/L 32 25 25  Calcium 8.6 - 10.4 mg/dL 10.9(H) 8.9 8.6(L)  Total Protein 6.5 - 8.1 g/dL - - -  Total Bilirubin 0.3 - 1.2 mg/dL - - -  Alkaline Phos 38 - 126 U/L - - -  AST 15 - 41 U/L - - -  ALT 0 - 44 U/L - - -    Hepatic Function Latest Ref Rng & Units 11/13/2018  02/16/2017 08/05/2013  Total Protein 6.5 - 8.1 g/dL 6.7 6.0(L) 6.6  Albumin 3.5 - 5.0 g/dL 3.9 3.0(L) 4.3  AST 15 - 41 U/L 36 26 15  ALT 0 - 44 U/L '28 23 13  ' Alk Phosphatase 38 - 126 U/L 69 126 76  Total Bilirubin 0.3 - 1.2 mg/dL 0.8 0.7 1.2  Bilirubin, Direct 0.0 - 0.3 mg/dL - - 0.2    Hemoccults.  First 2 are negative but the third 1 is positive.  Assessment:  #1.  Rectal bleeding.  Suspect hemorrhoidal bleeding.  Her stool is heme positive.  Need to rule out other diagnoses such as polyps or neoplasm.  #2.  Defecation issues.  Her symptoms suggest rectal spasm and rectovaginal fistula but recent exam by Dr. Barrie Dunker was normal.  Therefore it may be spurious due to soiling of vagina at the time of defecation.  #3.  Weight loss.  She has lost 24 pounds in the last 7 months.  She only has lost 1 pound in 4 weeks.  She does not have any worrisome symptoms.  Weight loss may have occurred in the first 3 months of this year because of dietary changes.  Will continue to monitor her weight loss and make sure that she is not thyrotoxic.   Plan:  Diagnostic colonoscopy in near future. She needs to be off Eliquis for 2 days; will get clearance from Dr. Johnny Bridge. Patient instructed to do Kegel exercise 2-3 times a day for 10-15 squeezes each time. Request copy of recent blood work for  review. Further recommendations to follow.

## 2019-06-27 NOTE — Telephone Encounter (Signed)
Pt wanted to clarify that she needed to hold Eliquis 2 days prior to colonoscopy that she is scheduled for on Monday 8/10. Pt says she had discussed this with Dr [name] at previous telehealth appt

## 2019-06-27 NOTE — Telephone Encounter (Signed)
Patient called stating that she has an upcoming colonoscopy . States that Dr. Olevia Perches office requested patient to contact our office about her medications.

## 2019-06-28 DIAGNOSIS — K625 Hemorrhage of anus and rectum: Secondary | ICD-10-CM | POA: Diagnosis not present

## 2019-06-28 NOTE — Telephone Encounter (Signed)
Patient notified and verbalized understanding. 

## 2019-06-28 NOTE — Telephone Encounter (Signed)
Yes, would hold Eliquis 48 hours prior to colonoscopy.

## 2019-06-29 NOTE — Telephone Encounter (Signed)
FYI Okay to hold Eliquis for 2 days

## 2019-07-01 ENCOUNTER — Other Ambulatory Visit: Payer: Self-pay

## 2019-07-01 ENCOUNTER — Other Ambulatory Visit (HOSPITAL_COMMUNITY)
Admission: RE | Admit: 2019-07-01 | Discharge: 2019-07-01 | Disposition: A | Discharge status: Home / Self Care | Payer: Medicare Other | Source: Ambulatory Visit | Attending: Internal Medicine | Admitting: Internal Medicine

## 2019-07-01 DIAGNOSIS — Z01812 Encounter for preprocedural laboratory examination: Secondary | ICD-10-CM | POA: Insufficient documentation

## 2019-07-01 DIAGNOSIS — Z20828 Contact with and (suspected) exposure to other viral communicable diseases: Secondary | ICD-10-CM | POA: Diagnosis not present

## 2019-07-01 LAB — SARS CORONAVIRUS 2 (TAT 6-24 HRS): SARS Coronavirus 2: NEGATIVE

## 2019-07-04 ENCOUNTER — Other Ambulatory Visit: Payer: Self-pay

## 2019-07-04 ENCOUNTER — Encounter (HOSPITAL_COMMUNITY): Payer: Self-pay | Admitting: *Deleted

## 2019-07-04 ENCOUNTER — Encounter (HOSPITAL_COMMUNITY)
Admission: RE | Disposition: A | Discharge status: Home / Self Care | Payer: Self-pay | Source: Home / Self Care | Attending: Internal Medicine

## 2019-07-04 ENCOUNTER — Ambulatory Visit (HOSPITAL_COMMUNITY)
Admission: RE | Admit: 2019-07-04 | Discharge: 2019-07-04 | Disposition: A | Discharge status: Home / Self Care | Payer: Medicare Other | Attending: Internal Medicine | Admitting: Internal Medicine

## 2019-07-04 DIAGNOSIS — F329 Major depressive disorder, single episode, unspecified: Secondary | ICD-10-CM | POA: Diagnosis not present

## 2019-07-04 DIAGNOSIS — F419 Anxiety disorder, unspecified: Secondary | ICD-10-CM | POA: Insufficient documentation

## 2019-07-04 DIAGNOSIS — I4891 Unspecified atrial fibrillation: Secondary | ICD-10-CM | POA: Insufficient documentation

## 2019-07-04 DIAGNOSIS — E039 Hypothyroidism, unspecified: Secondary | ICD-10-CM | POA: Diagnosis not present

## 2019-07-04 DIAGNOSIS — I10 Essential (primary) hypertension: Secondary | ICD-10-CM | POA: Diagnosis not present

## 2019-07-04 DIAGNOSIS — D122 Benign neoplasm of ascending colon: Secondary | ICD-10-CM | POA: Insufficient documentation

## 2019-07-04 DIAGNOSIS — Z79890 Hormone replacement therapy (postmenopausal): Secondary | ICD-10-CM | POA: Insufficient documentation

## 2019-07-04 DIAGNOSIS — R634 Abnormal weight loss: Secondary | ICD-10-CM

## 2019-07-04 DIAGNOSIS — E785 Hyperlipidemia, unspecified: Secondary | ICD-10-CM | POA: Insufficient documentation

## 2019-07-04 DIAGNOSIS — Z7901 Long term (current) use of anticoagulants: Secondary | ICD-10-CM | POA: Insufficient documentation

## 2019-07-04 DIAGNOSIS — K219 Gastro-esophageal reflux disease without esophagitis: Secondary | ICD-10-CM | POA: Diagnosis not present

## 2019-07-04 DIAGNOSIS — K5730 Diverticulosis of large intestine without perforation or abscess without bleeding: Secondary | ICD-10-CM | POA: Diagnosis not present

## 2019-07-04 DIAGNOSIS — K625 Hemorrhage of anus and rectum: Secondary | ICD-10-CM | POA: Diagnosis not present

## 2019-07-04 DIAGNOSIS — R150 Incomplete defecation: Secondary | ICD-10-CM | POA: Diagnosis not present

## 2019-07-04 DIAGNOSIS — K644 Residual hemorrhoidal skin tags: Secondary | ICD-10-CM | POA: Insufficient documentation

## 2019-07-04 DIAGNOSIS — Z96612 Presence of left artificial shoulder joint: Secondary | ICD-10-CM | POA: Diagnosis not present

## 2019-07-04 DIAGNOSIS — M1990 Unspecified osteoarthritis, unspecified site: Secondary | ICD-10-CM | POA: Diagnosis not present

## 2019-07-04 DIAGNOSIS — Z79899 Other long term (current) drug therapy: Secondary | ICD-10-CM | POA: Insufficient documentation

## 2019-07-04 HISTORY — PX: COLONOSCOPY: SHX5424

## 2019-07-04 SURGERY — COLONOSCOPY
Anesthesia: Moderate Sedation

## 2019-07-04 MED ORDER — MIDAZOLAM HCL 5 MG/5ML IJ SOLN
INTRAMUSCULAR | Status: AC
  Filled 2019-07-04: qty 10

## 2019-07-04 MED ORDER — SODIUM CHLORIDE 0.9 % IV SOLN
INTRAVENOUS | Status: DC
  Administered 2019-07-04: 13:00:00 via INTRAVENOUS

## 2019-07-04 MED ORDER — MEPERIDINE HCL 50 MG/ML IJ SOLN
INTRAMUSCULAR | Status: AC
  Filled 2019-07-04: qty 1

## 2019-07-04 MED ORDER — MIDAZOLAM HCL 5 MG/5ML IJ SOLN
INTRAMUSCULAR | Status: DC | PRN
  Administered 2019-07-04: 2 mg via INTRAVENOUS
  Administered 2019-07-04 (×2): 1 mg via INTRAVENOUS
  Administered 2019-07-04: 2 mg via INTRAVENOUS
  Administered 2019-07-04: 1 mg via INTRAVENOUS

## 2019-07-04 MED ORDER — STERILE WATER FOR IRRIGATION IR SOLN
Status: DC | PRN
  Administered 2019-07-04: 2.5 mL

## 2019-07-04 MED ORDER — MEPERIDINE HCL 50 MG/ML IJ SOLN
INTRAMUSCULAR | Status: DC | PRN
  Administered 2019-07-04 (×2): 25 mg via INTRAVENOUS

## 2019-07-04 NOTE — Interval H&P Note (Signed)
  History and Physical Interval Note:  07/04/2019 1:22 PM  [name]  has presented today for surgery, with the diagnosis of Rectal Bleeding , Weight Loss.  The various methods of treatment have been discussed with the patient and family. After consideration of risks, benefits and other options for treatment, the patient has consented to  Procedure(s) with comments: COLONOSCOPY (N/A) - 1220 as a surgical intervention.  The patient's history has been reviewed, patient examined, no change in status, stable for surgery.  I have reviewed the patient's chart and labs.  Questions were answered to the patient's satisfaction.    Patient has been off Eliquis for 2 days.   [employer]

## 2019-07-04 NOTE — Interval H&P Note (Signed)
  History and Physical Interval Note:  07/04/2019 1:19 PM  [name]  has presented today for surgery, with the diagnosis of Rectal Bleeding , Weight Loss.  The various methods of treatment have been discussed with the patient and family. After consideration of risks, benefits and other options for treatment, the patient has consented to  Procedure(s) with comments: COLONOSCOPY (N/A) - 1220 as a surgical intervention.  The patient's history has been reviewed, patient examined, no change in status, stable for surgery.  I have reviewed the patient's chart and labs.  Questions were answered to the patient's satisfaction.     [employer]

## 2019-07-04 NOTE — Op Note (Signed)
[facility] Patient Name: [name] Procedure Date: 07/04/2019 12:36 PM MRN: [id number] Date of Birth: 01/10/1946 Attending MD: Hildred Laser , MD CSN: [id number] Age: 73 Admit Type: Outpatient Procedure:                Colonoscopy Indications:              Rectal bleeding Providers:                Hildred Laser, MD, Jeanann Lewandowsky. Sharon Seller, RN, Nelma Rothman, Technician Referring MD:             [name], MD Medicines:                Meperidine 50 mg IV, Midazolam 8 mg IV Complications:            No immediate complications. Estimated Blood Loss:     Estimated blood loss was minimal. Procedure:                Pre-Anesthesia Assessment:                           - Prior to the procedure, a History and Physical                            was performed, and patient medications and                            allergies were reviewed. The patient's tolerance of                            previous anesthesia was also reviewed. The risks                            and benefits of the procedure and the sedation                            options and risks were discussed with the patient.                            All questions were answered, and informed consent                            was obtained. Prior Anticoagulants: The patient                            last took Eliquis (apixaban) 2 days prior to the                            procedure. ASA Grade Assessment: III - A patient                            with severe systemic disease. After reviewing the  risks and benefits, the patient was deemed in                            satisfactory condition to undergo the procedure.                           After obtaining informed consent, the colonoscope                            was passed under direct vision. Throughout the                            procedure, the patient's blood pressure, pulse, and   oxygen saturations were monitored continuously. The                            [id number]-[id number] ([id number]) scope was introduced through                            the anus and advanced to the the cecum, identified                            by appendiceal orifice and ileocecal valve. The                            colonoscopy was technically difficult and complex                            due to restricted mobility of the colon. Successful                            completion of the procedure was aided by increasing                            the dose of sedation medication. The patient                            tolerated the procedure well. The quality of the                            bowel preparation was adequate. The ileocecal                            valve, appendiceal orifice, and rectum were                            photographed. Scope In: 1:32:44 PM Scope Out: 2:06:21 PM Scope Withdrawal Time: 0 hours 10 minutes 8 seconds  Total Procedure Duration: 0 hours 33 minutes 37 seconds  Findings:      The perianal and digital rectal examinations were normal.      A 7 mm polyp was found in the proximal ascending colon. The polyp was       removed with a cold snare. Resection and retrieval were complete. To  stop active bleeding, two hemostatic clips were successfully placed (MR       conditional). There was no bleeding at the end of the procedure.      Multiple small and large-mouthed diverticula were found in the sigmoid       colon and hepatic flexure.      External hemorrhoids were found during retroflexion. The hemorrhoids       were medium-sized. Impression:               - One 7 mm polyp in the proximal ascending colon,                            removed with a cold snare. Resected and retrieved.                            Clips (MR conditional) were placed to treat                            postpolypectomy bleed.                           - Diverticulosis in the sigmoid  colon and at the                            hepatic flexure.                           - External hemorrhoids. Moderate Sedation:      Moderate (conscious) sedation was administered by the endoscopy nurse       and supervised by the endoscopist. The following parameters were       monitored: oxygen saturation, heart rate, blood pressure, CO2       capnography and response to care. Total physician intraservice time was       40 minutes. Recommendation:           - Patient has a contact number available for                            emergencies. The signs and symptoms of potential                            delayed complications were discussed with the                            patient. Return to normal activities tomorrow.                            Written discharge instructions were provided to the                            patient.                           - High fiber diet today.                           - Continue present  medications.                           - Resume Eliquis (apixaban) at prior dose in 2 days.                           - Await pathology results.                           - Repeat colonoscopy is recommended. The                            colonoscopy date will be determined after pathology                            results from today's exam become available for                            review. Procedure Code(s):        --- Professional ---                           [phone], Colonoscopy, flexible; with removal of                            tumor(s), polyp(s), or other lesion(s) by snare                            technique                           99153, Moderate sedation; each additional 15                            minutes intraservice time                           99153, Moderate sedation; each additional 15                            minutes intraservice time                           G0500, Moderate sedation services provided by the                             same physician or other qualified health care                            professional performing a gastrointestinal                            endoscopic service that sedation supports,                            requiring the presence of an independent trained  observer to assist in the monitoring of the                            patient's level of consciousness and physiological                            status; initial 15 minutes of intra-service time;                            patient age 3 years or older (additional time may                            be reported with [phone], as appropriate) Diagnosis Code(s):        --- Professional ---                           K63.5, Polyp of colon                           K64.4, Residual hemorrhoidal skin tags                           K62.5, Hemorrhage of anus and rectum                           K57.30, Diverticulosis of large intestine without                            perforation or abscess without bleeding CPT copyright 2019 American Medical Association. All rights reserved. The codes documented in this report are preliminary and upon coder review may  be revised to meet current compliance requirements. Hildred Laser, MD Hildred Laser, MD 07/04/2019 2:22:43 PM This report has been signed electronically. Number of Addenda: 0

## 2019-07-04 NOTE — Discharge Instructions (Signed)
Resume Eliquis on 07/06/2019. Resume other medications as before High-fiber diet. No driving for 24 hours. Physician will call with biopsy results. Please remember you cannot have an MRI until clips have passed.    PATIENT INSTRUCTIONS POST-ANESTHESIA  IMMEDIATELY FOLLOWING SURGERY:  Do not drive or operate machinery for the first twenty four hours after surgery.  Do not make any important decisions for twenty four hours after surgery or while taking narcotic pain medications or sedatives.  If you develop intractable nausea and vomiting or a severe headache please notify your doctor immediately.  FOLLOW-UP:  Please make an appointment with your surgeon as instructed. You do not need to follow up with anesthesia unless specifically instructed to do so.  WOUND CARE INSTRUCTIONS (if applicable):  Keep a dry clean dressing on the anesthesia/puncture wound site if there is drainage.  Once the wound has quit draining you may leave it open to air.  Generally you should leave the bandage intact for twenty four hours unless there is drainage.  If the epidural site drains for more than 36-48 hours please call the anesthesia department.  QUESTIONS?:  Please feel free to call your physician or the hospital operator if you have any questions, and they will be happy to assist you.      Colonoscopy, Adult, Care After This sheet gives you information about how to care for yourself after your procedure. Your doctor may also give you more specific instructions. If you have problems or questions, call your doctor. What can I expect after the procedure? After the procedure, it is common to have:  A small amount of blood in your poop for 24 hours.  Some gas.  Mild cramping or bloating in your belly. Follow these instructions at home: General instructions  For the first 24 hours after the procedure: ? Do not drive or use machinery. ? Do not sign important documents. ? Do not drink alcohol. ? Do your  daily activities more slowly than normal. ? Eat foods that are soft and easy to digest.  Take over-the-counter or prescription medicines only as told by your doctor. To help cramping and bloating:   Try walking around.  Put heat on your belly (abdomen) as told by your doctor. Use a heat source that your doctor recommends, such as a moist heat pack or a heating pad. ? Put a towel between your skin and the heat source. ? Leave the heat on for 20-30 minutes. ? Remove the heat if your skin turns bright red. This is especially important if you cannot feel pain, heat, or cold. You can get burned. Eating and drinking   Drink enough fluid to keep your pee (urine) clear or pale yellow.  Return to your normal diet as told by your doctor. Avoid heavy or fried foods that are hard to digest.  Avoid drinking alcohol for as long as told by your doctor. Contact a doctor if:  You have blood in your poop (stool) 2-3 days after the procedure. Get help right away if:  You have more than a small amount of blood in your poop.  You see large clumps of tissue (blood clots) in your poop.  Your belly is swollen.  You feel sick to your stomach (nauseous).  You throw up (vomit).  You have a fever.  You have belly pain that gets worse, and medicine does not help your pain. Summary  After the procedure, it is common to have a small amount of blood in your poop.  You may also have mild cramping and bloating in your belly.  For the first 24 hours after the procedure, do not drive or use machinery, do not sign important documents, and do not drink alcohol.  Get help right away if you have a lot of blood in your poop, feel sick to your stomach, have a fever, or have more belly pain. This information is not intended to replace advice given to you by your health care provider. Make sure you discuss any questions you have with your health care provider. Document Released: 12/13/2010 Document Revised:  09/10/2017 Document Reviewed: 08/04/2016 Elsevier Patient Education  2020 [place].   Colon Polyps  Polyps are tissue growths inside the body. Polyps can grow in many places, including the large intestine (colon). A polyp may be a round bump or a mushroom-shaped growth. You could have one polyp or several. Most colon polyps are noncancerous (benign). However, some colon polyps can become cancerous over time. Finding and removing the polyps early can help prevent this. What are the causes? The exact cause of colon polyps is not known. What increases the risk? You are more likely to develop this condition if you:  Have a family history of colon cancer or colon polyps.  Are older than 80 or older than 45 if you are African American.  Have inflammatory bowel disease, such as ulcerative colitis or Crohn's disease.  Have certain hereditary conditions, such as: ? Familial adenomatous polyposis. ? Lynch syndrome. ? Turcot syndrome. ? Peutz-Jeghers syndrome.  Are overweight.  Smoke cigarettes.  Do not get enough exercise.  Drink too much alcohol.  Eat a diet that is high in fat and red meat and low in fiber.  Had childhood cancer that was treated with abdominal radiation. What are the signs or symptoms? Most polyps do not cause symptoms. If you have symptoms, they may include:  Blood coming from your rectum when having a bowel movement.  Blood in your stool. The stool may look dark red or black.  Abdominal pain.  A change in bowel habits, such as constipation or diarrhea. How is this diagnosed? This condition is diagnosed with a colonoscopy. This is a procedure in which a lighted, flexible scope is inserted into the anus and then passed into the colon to examine the area. Polyps are sometimes found when a colonoscopy is done as part of routine cancer screening tests. How is this treated? Treatment for this condition involves removing any polyps that are found. Most  polyps can be removed during a colonoscopy. Those polyps will then be tested for cancer. Additional treatment may be needed depending on the results of testing. Follow these instructions at home: Lifestyle  Maintain a healthy weight, or lose weight if recommended by your health care provider.  Exercise every day or as told by your health care provider.  Do not use any products that contain nicotine or tobacco, such as cigarettes and e-cigarettes. If you need help quitting, ask your health care provider.  If you drink alcohol, limit how much you have: ? 0-1 drink a day for women. ? 0-2 drinks a day for men.  Be aware of how much alcohol is in your drink. In the [place] one drink equals one 12 oz bottle of beer (355 mL), one 5 oz glass of wine (148 mL), or one 1 oz shot of hard liquor (44 mL). Eating and drinking   Eat foods that are high in fiber, such as fruits, vegetables, and whole grains.  Eat foods that are high in calcium and vitamin D, such as milk, cheese, yogurt, eggs, liver, fish, and broccoli.  Limit foods that are high in fat, such as fried foods and desserts.  Limit the amount of red meat and processed meat you eat, such as hot dogs, sausage, bacon, and lunch meats. General instructions  Keep all follow-up visits as told by your health care provider. This is important. ? This includes having regularly scheduled colonoscopies. ? Talk to your health care provider about when you need a colonoscopy. Contact a health care provider if:  You have new or worsening bleeding during a bowel movement.  You have new or increased blood in your stool.  You have a change in bowel habits.  You lose weight for no known reason. Summary  Polyps are tissue growths inside the body. Polyps can grow in many places, including the colon.  Most colon polyps are noncancerous (benign), but some can become cancerous over time.  This condition is diagnosed with a colonoscopy.  Treatment  for this condition involves removing any polyps that are found. Most polyps can be removed during a colonoscopy. This information is not intended to replace advice given to you by your health care provider. Make sure you discuss any questions you have with your health care provider. Document Released: 08/06/2004 Document Revised: 02/25/2018 Document Reviewed: 02/25/2018 Elsevier Patient Education  2020 [employer].   Hemorrhoids Hemorrhoids are swollen veins that may develop:  In the butt (rectum). These are called internal hemorrhoids.  Around the opening of the butt (anus). These are called external hemorrhoids. Hemorrhoids can cause pain, itching, or bleeding. Most of the time, they do not cause serious problems. They usually get better with diet changes, lifestyle changes, and other home treatments. What are the causes? This condition may be caused by:  Having trouble pooping (constipation).  Pushing hard (straining) to poop.  Watery poop (diarrhea).  Pregnancy.  Being very overweight (obese).  Sitting for long periods of time.  Heavy lifting or other activity that causes you to strain.  Anal sex.  Riding a bike for a long period of time. What are the signs or symptoms? Symptoms of this condition include:  Pain.  Itching or soreness in the butt.  Bleeding from the butt.  Leaking poop.  Swelling in the area.  One or more lumps around the opening of your butt. How is this diagnosed? A doctor can often diagnose this condition by looking at the affected area. The doctor may also:  Do an exam that involves feeling the area with a gloved hand (digital rectal exam).  Examine the area inside your butt using a small tube (anoscope).  Order blood tests. This may be done if you have lost a lot of blood.  Have you get a test that involves looking inside the colon using a flexible tube with a camera on the end (sigmoidoscopy or colonoscopy). How is this  treated? This condition can usually be treated at home. Your doctor may tell you to change what you eat, make lifestyle changes, or try home treatments. If these do not help, procedures can be done to remove the hemorrhoids or make them smaller. These may involve:  Placing rubber bands at the base of the hemorrhoids to cut off their blood supply.  Injecting medicine into the hemorrhoids to shrink them.  Shining a type of light energy onto the hemorrhoids to cause them to fall off.  Doing surgery to remove the hemorrhoids or  cut off their blood supply. Follow these instructions at home: Eating and drinking   Eat foods that have a lot of fiber in them. These include whole grains, beans, nuts, fruits, and vegetables.  Ask your doctor about taking products that have added fiber (fibersupplements).  Reduce the amount of fat in your diet. You can do this by: ? Eating low-fat dairy products. ? Eating less red meat. ? Avoiding processed foods.  Drink enough fluid to keep your pee (urine) pale yellow. Managing pain and swelling   Take a warm-water bath (sitz bath) for 20 minutes to ease pain. Do this 3-4 times a day. You may do this in a bathtub or using a portable sitz bath that fits over the toilet.  If told, put ice on the painful area. It may be helpful to use ice between your warm baths. ? Put ice in a plastic bag. ? Place a towel between your skin and the bag. ? Leave the ice on for 20 minutes, 2-3 times a day. General instructions  Take over-the-counter and prescription medicines only as told by your doctor. ? Medicated creams and medicines may be used as told.  Exercise often. Ask your doctor how much and what kind of exercise is best for you.  Go to the bathroom when you have the urge to poop. Do not wait.  Avoid pushing too hard when you poop.  Keep your butt dry and clean. Use wet toilet paper or moist towelettes after pooping.  Do not sit on the toilet for a long  time.  Keep all follow-up visits as told by your doctor. This is important. Contact a doctor if you:  Have pain and swelling that do not get better with treatment or medicine.  Have trouble pooping.  Cannot poop.  Have pain or swelling outside the area of the hemorrhoids. Get help right away if you have:  Bleeding that will not stop. Summary  Hemorrhoids are swollen veins in the butt or around the opening of the butt.  They can cause pain, itching, or bleeding.  Eat foods that have a lot of fiber in them. These include whole grains, beans, nuts, fruits, and vegetables.  Take a warm-water bath (sitz bath) for 20 minutes to ease pain. Do this 3-4 times a day. This information is not intended to replace advice given to you by your health care provider. Make sure you discuss any questions you have with your health care provider. Document Released: 08/19/2008 Document Revised: 11/18/2018 Document Reviewed: 04/01/2018 Elsevier Patient Education  2020 [employer].     Diverticulosis  Diverticulosis is a condition that develops when small pouches (diverticula) form in the wall of the large intestine (colon). The colon is where water is absorbed and stool is formed. The pouches form when the inside layer of the colon pushes through weak spots in the outer layers of the colon. You may have a few pouches or many of them. What are the causes? The cause of this condition is not known. What increases the risk? The following factors may make you more likely to develop this condition:  Being older than age 7. Your risk for this condition increases with age. Diverticulosis is rare among people younger than age 55. By age 73, many people have it.  Eating a low-fiber diet.  Having frequent constipation.  Being overweight.  Not getting enough exercise.  Smoking.  Taking over-the-counter pain medicines, like aspirin and ibuprofen.  Having a family history of diverticulosis. What  are the signs or symptoms? In most people, there are no symptoms of this condition. If you do have symptoms, they may include:  Bloating.  Cramps in the abdomen.  Constipation or diarrhea.  Pain in the lower left side of the abdomen. How is this diagnosed? This condition is most often diagnosed during an exam for other colon problems. Because diverticulosis usually has no symptoms, it often cannot be diagnosed independently. This condition may be diagnosed by:  Using a flexible scope to examine the colon (colonoscopy).  Taking an X-ray of the colon after dye has been put into the colon (barium enema).  Doing a CT scan. How is this treated? You may not need treatment for this condition if you have never developed an infection related to diverticulosis. If you have had an infection before, treatment may include:  Eating a high-fiber diet. This may include eating more fruits, vegetables, and grains.  Taking a fiber supplement.  Taking a live bacteria supplement (probiotic).  Taking medicine to relax your colon.  Taking antibiotic medicines. Follow these instructions at home:  Drink 6-8 glasses of water or more each day to prevent constipation.  Try not to strain when you have a bowel movement.  If you have had an infection before: ? Eat more fiber as directed by your health care provider or your diet and nutrition specialist (dietitian). ? Take a fiber supplement or probiotic, if your health care provider approves.  Take over-the-counter and prescription medicines only as told by your health care provider.  If you were prescribed an antibiotic, take it as told by your health care provider. Do not stop taking the antibiotic even if you start to feel better.  Keep all follow-up visits as told by your health care provider. This is important. Contact a health care provider if:  You have pain in your abdomen.  You have bloating.  You have cramps.  You have not had a bowel  movement in 3 days. Get help right away if:  Your pain gets worse.  Your bloating becomes very bad.  You have a fever or chills, and your symptoms suddenly get worse.  You vomit.  You have bowel movements that are bloody or black.  You have bleeding from your rectum. Summary  Diverticulosis is a condition that develops when small pouches (diverticula) form in the wall of the large intestine (colon).  You may have a few pouches or many of them.  This condition is most often diagnosed during an exam for other colon problems.  If you have had an infection related to diverticulosis, treatment may include increasing the fiber in your diet, taking supplements, or taking medicines. This information is not intended to replace advice given to you by your health care provider. Make sure you discuss any questions you have with your health care provider. Document Released: 08/07/2004 Document Revised: 10/23/2017 Document Reviewed: 09/29/2016 Elsevier Patient Education  2020 [employer].

## 2019-07-05 ENCOUNTER — Telehealth: Payer: Self-pay | Admitting: Cardiology

## 2019-07-05 ENCOUNTER — Telehealth (INDEPENDENT_AMBULATORY_CARE_PROVIDER_SITE_OTHER): Payer: Self-pay | Admitting: Internal Medicine

## 2019-07-05 NOTE — Telephone Encounter (Signed)
Patient left message stating she had colonoscopy yesterday and she was told there were clips put in where the polyps were removed - she wanted to know how long they will be there and how will they be removed - ph# [phone]

## 2019-07-05 NOTE — Telephone Encounter (Signed)
Patient notified and verbalized understanding.  Note forwarded to Dr. Esmond Plants with [place] ([facility]) as well.

## 2019-07-05 NOTE — Telephone Encounter (Signed)
Pt takes Eliquis for afib with CHADS2VASc score of 3 (age, sex, HTN). Renal function is normal. Ok to hold Eliquis for 2-3 days as needed prior to procedure.

## 2019-07-05 NOTE — Telephone Encounter (Signed)
Fwd to pharm.

## 2019-07-05 NOTE — Telephone Encounter (Signed)
[name] called stating that she has an upcoming Arthroscopy of left knee with Dr. Veverly Fells . Patient was instructed to contact our office about going off Eliquis.

## 2019-07-07 DIAGNOSIS — H02135 Senile ectropion of left lower eyelid: Secondary | ICD-10-CM | POA: Diagnosis not present

## 2019-07-07 DIAGNOSIS — H04512 Dacryolith of left lacrimal passage: Secondary | ICD-10-CM | POA: Diagnosis not present

## 2019-07-07 DIAGNOSIS — H04552 Acquired stenosis of left nasolacrimal duct: Secondary | ICD-10-CM | POA: Diagnosis not present

## 2019-07-07 NOTE — Telephone Encounter (Signed)
Dr.Rehman was made aware and he is calling the patient with BX results and to address patient questions.[name]

## 2019-07-11 NOTE — Telephone Encounter (Signed)
Patient had TCS , and I have reviewed the results with her per NUR instruction. He states that the patient will need OV with him in 8 weeks.

## 2019-07-13 ENCOUNTER — Encounter (HOSPITAL_COMMUNITY): Payer: Self-pay | Admitting: Internal Medicine

## 2019-07-13 DIAGNOSIS — Y999 Unspecified external cause status: Secondary | ICD-10-CM | POA: Diagnosis not present

## 2019-07-13 DIAGNOSIS — X58XXXA Exposure to other specified factors, initial encounter: Secondary | ICD-10-CM | POA: Diagnosis not present

## 2019-07-13 DIAGNOSIS — M659 Synovitis and tenosynovitis, unspecified: Secondary | ICD-10-CM | POA: Diagnosis not present

## 2019-07-13 DIAGNOSIS — M94262 Chondromalacia, left knee: Secondary | ICD-10-CM | POA: Diagnosis not present

## 2019-07-13 DIAGNOSIS — S83272A Complex tear of lateral meniscus, current injury, left knee, initial encounter: Secondary | ICD-10-CM | POA: Diagnosis not present

## 2019-07-13 DIAGNOSIS — S83222A Peripheral tear of medial meniscus, current injury, left knee, initial encounter: Secondary | ICD-10-CM | POA: Diagnosis not present

## 2019-08-03 DIAGNOSIS — Z23 Encounter for immunization: Secondary | ICD-10-CM | POA: Diagnosis not present

## 2019-08-09 DIAGNOSIS — L239 Allergic contact dermatitis, unspecified cause: Secondary | ICD-10-CM | POA: Diagnosis not present

## 2019-08-15 DIAGNOSIS — L239 Allergic contact dermatitis, unspecified cause: Secondary | ICD-10-CM | POA: Diagnosis not present

## 2019-08-22 ENCOUNTER — Telehealth: Payer: Self-pay | Admitting: Cardiology

## 2019-08-22 NOTE — Telephone Encounter (Signed)
Patient calling the office for samples of medication:   1.  What medication and dosage are you requesting samples for?   Eliquis   2.  Are you currently out of this medication?   Patient states that she is in the donut hole with her insurance.

## 2019-08-23 MED ORDER — APIXABAN 5 MG PO TABS: 5.0000 mg | ORAL_TABLET | Freq: Two times a day (BID) | ORAL | 0 refills | Status: DC

## 2019-08-23 NOTE — Telephone Encounter (Signed)
Patient aware that samples are available for pick up.

## 2019-08-23 NOTE — Telephone Encounter (Signed)
Patient asking for samples -  stated she called yesterday

## 2019-08-24 DIAGNOSIS — M25561 Pain in right knee: Secondary | ICD-10-CM | POA: Diagnosis not present

## 2019-08-30 ENCOUNTER — Other Ambulatory Visit: Payer: Self-pay | Admitting: *Deleted

## 2019-08-30 MED ORDER — APIXABAN 5 MG PO TABS: 5.0000 mg | ORAL_TABLET | Freq: Two times a day (BID) | ORAL | 3 refills | Status: DC

## 2019-09-10 NOTE — Progress Notes (Signed)
Subjective:    Patient ID: [name], female    DOB: 08-16-1946, 73 y.o.   MRN: [id number]  HPI  Kerlyn Tidrick is a 73 year old female with a past medical history of anxiety, depression, hypertension, atrial fibrillation on Eliquis, hypothyroidism and GERD.  She underwent a colonoscopy with Dr. Laural Golden on 07/04/2019 she identified a 7 mm tubular adenomatous polyp to the proximal ascending colon and diverticulosis to the sigmoid and at the hepatic flexure.  External hemorrhoids were noted as well.  She was advised to repeat a colonoscopy in 5 years.  She reports passing solid or loose stools, her bowel pattern varies.  She denies having any upper or lower abdominal pain.  She infrequently sees bright red blood on the toilet tissue which last occurred this morning.  She denies having any rectal pain.  She attributes her rectal bleeding to being on Eliquis in the setting of having hemorrhoids.  Previously has concerns that she was passing stool from her vagina.  The recent exam by Dr. Barrie Dunker was reported as normal without evidence of a rectovaginal fistula.  She reports wiping from the front to back towards the anus then back up towards her vagina and urethra area after urinating and/or passing a bowel movement.  I discussed she must wipe from the front towards the back then discard the toilet tissue to avoid contaminating the vaginal and urethral orifice with stool.  No further weight loss.  She has gained 6 pounds over the past 2 months.  Her weight is up to 170 pounds.  No other complaints today.  CBC    Component Value Date/Time   WBC 12.5 (H) 05/30/2019 1417   RBC 5.05 05/30/2019 1417   HGB 15.6 (H) 05/30/2019 1417   HCT 46.6 (H) 05/30/2019 1417   PLT 288 05/30/2019 1417   MCV 92.3 05/30/2019 1417   MCH 30.9 05/30/2019 1417   MCHC 33.5 05/30/2019 1417   RDW 12.2 05/30/2019 1417   LYMPHSABS 2.4 11/13/2018 1545   MONOABS 1.1 (H) 11/13/2018 1545   EOSABS 0.1 11/13/2018 1545   BASOSABS  0.0 11/13/2018 1545     Colonoscopy 07/04/2019:  - One 7 mm tubular adenomatous polyp in the proximal ascending colon, removed with a cold snare. Clips (MR conditional) were placed to treat postpolypectomy bleed ) patient took Eliquis 2 days prior to procedure).  - Diverticulosis in the sigmoid colon and at the hepatic flexure. - External hemorrhoids. -5 year recall.   Colonoscopy 06/15/2012: 1. Colon, biopsy, transverse - ULCERATED COLONIC MUCOSA WITH INTRAMUCOSAL LYMPHOID AGGREGATES. - NO EVIDENCE OF VIRAL CYTOPATHIC CHANGES, CHRONICITY, DYSPLASIA OR MALIGNANCY. Possibly due to NSAID use. 2. Colon, polyp(s), sigmoid - PROLAPSE TYPE POLYP. - NO EVIDENCE OF ADENOMATOUS CHANGES OR MALIGNANCY  Current Outpatient Medications on File Prior to Visit  Medication Sig Dispense Refill  . acetaminophen (TYLENOL) 500 MG tablet Take 500-1,000 mg by mouth every 6 (six) hours as needed (pain.).    Marland Kitchen ALPRAZolam (XANAX) 0.25 MG tablet Take 0.25 mg by mouth daily as needed for anxiety.    Marland Kitchen apixaban (ELIQUIS) 5 MG TABS tablet Take 1 tablet (5 mg total) by mouth 2 (two) times daily. 180 tablet 3  . Calcium Carb-Cholecalciferol (CALCIUM 600 + D PO) Take 2 tablets daily by mouth.    . carboxymethylcellulose (REFRESH PLUS) 0.5 % SOLN Place 1 drop into both eyes 2 (two) times daily.    . chlorthalidone (HYGROTON) 25 MG tablet Take 25 mg by mouth  daily.    . Cholecalciferol (VITAMIN D3) 50 MCG (2000 UT) TABS Take 2,000 Units by mouth daily.    . citalopram (CELEXA) 20 MG tablet Take 20 mg by mouth daily.     Marland Kitchen diltiazem (CARDIZEM CD) 240 MG 24 hr capsule Take 240 mg by mouth daily.    Marland Kitchen levothyroxine (SYNTHROID, LEVOTHROID) 75 MCG tablet Take 75 mcg by mouth every morning.     Marland Kitchen MAGNESIUM PO Take 266 mg by mouth daily.     . Multiple Vitamins-Minerals (MULTIVITAMIN WITH MINERALS) tablet Take 1 tablet daily by mouth.    . polyethylene glycol (MIRALAX / GLYCOLAX) packet Take 17 g by mouth every other day.      . potassium chloride (K-DUR) 10 MEQ tablet Take 10 mEq by mouth daily.    . simvastatin (ZOCOR) 10 MG tablet Take 10 mg by mouth daily.    . vitamin C (ASCORBIC ACID) 500 MG tablet Take 500 mg by mouth daily.     No current facility-administered medications on file prior to visit.    Allergies  Allergen Reactions  . Latex Rash  . Penicillins Rash and Other (See Comments)    Has patient had a PCN reaction causing immediate rash, facial/tongue/throat swelling, SOB or lightheadedness with hypotension: #  #  #  YES  #  #  #  Has patient had a PCN reaction causing severe rash involving mucus membranes or skin necrosis: No Has patient had a PCN reaction that required hospitalization Yes Has patient had a PCN reaction occurring within the last 10 years: No If all of the above answers are "NO", then may proceed with Cephalosporin use.   . Sulfa Antibiotics Rash  . Tramadol Itching      Past Medical History:  Diagnosis Date  . Anxiety   . Arthritis   . Atrial fibrillation [facility])    Documented December 2019  . Depression   . GERD (gastroesophageal reflux disease)   . Hyperlipidemia   . Hypertension   . Hypothyroidism    Past Surgical History:  Procedure Laterality Date  . APPENDECTOMY    . BUNIONECTOMY Bilateral   . CATARACT EXTRACTION W/PHACO Right 10/09/2017   Procedure: CATARACT EXTRACTION PHACO AND INTRAOCULAR LENS PLACEMENT (IOC);  Surgeon: [name], MD;  Location: [facility];  Service: Ophthalmology;  Laterality: Right;  CDE: 6.65  . CATARACT EXTRACTION W/PHACO Left 12/11/2017   Procedure: CATARACT EXTRACTION PHACO AND INTRAOCULAR LENS PLACEMENT (IOC);  Surgeon: [name], MD;  Location: [facility];  Service: Ophthalmology;  Laterality: Left;  CDE: 5.28  . COLONOSCOPY  06/18/2012   Procedure: COLONOSCOPY;  Surgeon: [name], MD;  [facility];  Service: Endoscopy;  Laterality: N/A;  730  . COLONOSCOPY N/A 07/04/2019   Procedure: COLONOSCOPY;  Surgeon:  [name], MD;  [facility];  Service: Endoscopy;  Laterality: N/A;  1220  . ESOPHAGOGASTRODUODENOSCOPY    . Hammer toe repair Left   . HEMORRHOID SURGERY    . THYROIDECTOMY    . TONSILLECTOMY    . TOTAL ABDOMINAL HYSTERECTOMY    . TOTAL SHOULDER ARTHROPLASTY Left 02/06/2017  . TOTAL SHOULDER ARTHROPLASTY Left 02/06/2017   Procedure: TOTAL SHOULDER ARTHROPLASTY;  Surgeon: [name], MD;  Location: [facility];  Service: Orthopedics;  Laterality: Left;    Review of Systems see HPI, all other systems reviewed and are negative     Objective:   Physical Exam\BP (!) 147/73   Pulse 60   Temp  98.4 F (36.9 C)   Ht 5\' 3"  (1.6 m)   Wt 579FGE lb 6.4 oz (77.3 kg)   BMI 30.19 kg/m  General: 73 year old female well-developed ambulating with assistance of a cane in no acute distress Eyes: Sclera nonicteric, conjunctiva pink Heart: Regular rate and rhythm, systolic murmur Lungs: Breath sounds clear throughout Abdomen: Soft, mild right lower quadrant tenderness without rebound or guarding, positive bowel sounds to all 4 quadrants, no HSM Rectal: Patient declined rectal exam. Extremities: No edema Neuro: Alert and oriented x4, no focal deficits    Assessment & Plan:   40.  73 year old female with rectal bleeding most likely due to external hemorrhoids on Eliquis -Apply a small amount of Desitin inside the anal area into the external hemorrhoid area as needed for hemorrhoidal irritation -Avoid straining -Benefiber 1 tablespoon once daily, probiotic of choice once daily -Patient to call the office if her rectal bleeding persists or worsens  2.  Colon polyp -Next colonoscopy due August 2025  3.  IBS symptoms and concerns of stool coming from the vaginal opening -Patient to wipe from front to back then discard tissue after urinating or passing a bowel movement.  If she continues to assess stool coming from the vaginal opening she will require pelvic/rectal MRI to rule out  rectovaginal fistula -Benefiber and probiotic -Follow-up in the office in 6 months and as needed  4.  Weight loss, resolved.  She is gained 6 pounds in the past 8 weeks.  Weight today is 170 pounds.  5.  Atrial fibrillation on Eliquis

## 2019-09-12 ENCOUNTER — Ambulatory Visit (INDEPENDENT_AMBULATORY_CARE_PROVIDER_SITE_OTHER): Payer: Medicare Other | Admitting: Nurse Practitioner

## 2019-09-12 ENCOUNTER — Other Ambulatory Visit: Payer: Self-pay

## 2019-09-12 ENCOUNTER — Encounter (INDEPENDENT_AMBULATORY_CARE_PROVIDER_SITE_OTHER): Payer: Self-pay | Admitting: Nurse Practitioner

## 2019-09-12 DIAGNOSIS — Z86010 Personal history of colonic polyps: Secondary | ICD-10-CM | POA: Diagnosis not present

## 2019-09-12 NOTE — Patient Instructions (Addendum)
1.  Reviewed your colonoscopy results.  You are due for a repeat colonoscopy August 2025.  2.  After using the bathroom to urinate or passing stool wipe from front to back and discard tissue.  3.  If you continue to have concerns about stool coming out of the vagina call our office, I discussed scheduling a pelvic/rectal MRI if this continues after you change your wiping pattern from front to back after urinating or passing a bowel movement.  4.  Apply a small amount of Desitin inside the anal area into the external hemorrhoid area as needed for hemorrhoid irritation.  Call our office if you have persistent or worsening rectal bleeding.  5.  Benefiber 1 tablespoon once daily to regulate bowel movements.  Phillips bacteria probiotic 1 capsule once daily.

## 2019-10-04 DIAGNOSIS — M5416 Radiculopathy, lumbar region: Secondary | ICD-10-CM | POA: Diagnosis not present

## 2019-10-04 DIAGNOSIS — Z4789 Encounter for other orthopedic aftercare: Secondary | ICD-10-CM | POA: Diagnosis not present

## 2019-10-17 DIAGNOSIS — Z1231 Encounter for screening mammogram for malignant neoplasm of breast: Secondary | ICD-10-CM | POA: Diagnosis not present

## 2019-10-19 DIAGNOSIS — M25662 Stiffness of left knee, not elsewhere classified: Secondary | ICD-10-CM | POA: Diagnosis not present

## 2019-10-19 DIAGNOSIS — M25562 Pain in left knee: Secondary | ICD-10-CM | POA: Diagnosis not present

## 2019-10-19 DIAGNOSIS — M1712 Unilateral primary osteoarthritis, left knee: Secondary | ICD-10-CM | POA: Diagnosis not present

## 2019-10-19 DIAGNOSIS — R269 Unspecified abnormalities of gait and mobility: Secondary | ICD-10-CM | POA: Diagnosis not present

## 2019-10-19 DIAGNOSIS — M545 Low back pain: Secondary | ICD-10-CM | POA: Diagnosis not present

## 2019-10-19 DIAGNOSIS — R531 Weakness: Secondary | ICD-10-CM | POA: Diagnosis not present

## 2019-10-21 ENCOUNTER — Encounter (HOSPITAL_COMMUNITY): Payer: Self-pay

## 2019-10-21 ENCOUNTER — Emergency Department (HOSPITAL_COMMUNITY): Payer: Medicare Other

## 2019-10-21 ENCOUNTER — Other Ambulatory Visit: Payer: Self-pay

## 2019-10-21 ENCOUNTER — Emergency Department (HOSPITAL_COMMUNITY)
Admission: EM | Admit: 2019-10-21 | Discharge: 2019-10-21 | Disposition: A | Discharge status: Home / Self Care | Payer: Medicare Other | Attending: Emergency Medicine | Admitting: Emergency Medicine

## 2019-10-21 DIAGNOSIS — I4891 Unspecified atrial fibrillation: Secondary | ICD-10-CM | POA: Insufficient documentation

## 2019-10-21 DIAGNOSIS — R079 Chest pain, unspecified: Secondary | ICD-10-CM | POA: Diagnosis not present

## 2019-10-21 DIAGNOSIS — Z79899 Other long term (current) drug therapy: Secondary | ICD-10-CM | POA: Diagnosis not present

## 2019-10-21 DIAGNOSIS — Z7901 Long term (current) use of anticoagulants: Secondary | ICD-10-CM | POA: Diagnosis not present

## 2019-10-21 DIAGNOSIS — Z91040 Latex allergy status: Secondary | ICD-10-CM | POA: Insufficient documentation

## 2019-10-21 DIAGNOSIS — E039 Hypothyroidism, unspecified: Secondary | ICD-10-CM | POA: Insufficient documentation

## 2019-10-21 DIAGNOSIS — I10 Essential (primary) hypertension: Secondary | ICD-10-CM | POA: Insufficient documentation

## 2019-10-21 DIAGNOSIS — R002 Palpitations: Secondary | ICD-10-CM | POA: Diagnosis present

## 2019-10-21 LAB — BASIC METABOLIC PANEL
Anion gap: 12 (ref 5–15)
BUN: 12 mg/dL (ref 8–23)
CO2: 25 mmol/L (ref 22–32)
Calcium: 10.1 mg/dL (ref 8.9–10.3)
Chloride: 102 mmol/L (ref 98–111)
Creatinine, Ser: 0.78 mg/dL (ref 0.44–1.00)
GFR calc Af Amer: >60 mL/min (ref >60)
GFR calc non Af Amer: >60 mL/min (ref >60)
Glucose, Bld: 166 mg/dL — ABNORMAL HIGH (ref 70–99)
Potassium: 2.9 mmol/L — ABNORMAL LOW (ref 3.5–5.1)
Sodium: 139 mmol/L (ref 135–145)

## 2019-10-21 LAB — CBC
HCT: 46.8 % — ABNORMAL HIGH (ref 36.0–46.0)
Hemoglobin: 15.7 g/dL — ABNORMAL HIGH (ref 12.0–15.0)
MCH: 31 pg (ref 26.0–34.0)
MCHC: 33.5 g/dL (ref 30.0–36.0)
MCV: 92.3 fL (ref 80.0–100.0)
Platelets: 274 10*3/uL (ref 150–400)
RBC: 5.07 MIL/uL (ref 3.87–5.11)
RDW: 13 % (ref 11.5–15.5)
WBC: 8.5 10*3/uL (ref 4.0–10.5)
nRBC: 0 % (ref 0.0–0.2)

## 2019-10-21 LAB — MAGNESIUM: Magnesium: 2.2 mg/dL (ref 1.7–2.4)

## 2019-10-21 MED ORDER — POTASSIUM CHLORIDE 10 MEQ/100ML IV SOLN
10.0000 meq | INTRAVENOUS | Status: AC
  Administered 2019-10-21 (×2): 10 meq via INTRAVENOUS
  Filled 2019-10-21 (×2): qty 100

## 2019-10-21 MED ORDER — POTASSIUM CHLORIDE CRYS ER 20 MEQ PO TBCR
60.0000 meq | EXTENDED_RELEASE_TABLET | Freq: Once | ORAL | Status: AC
  Administered 2019-10-21: 60 meq via ORAL
  Filled 2019-10-21: qty 3

## 2019-10-21 MED ORDER — SODIUM CHLORIDE 0.9% FLUSH
3.0000 mL | Freq: Once | INTRAVENOUS | Status: AC
  Administered 2019-10-21: 3 mL via INTRAVENOUS

## 2019-10-21 NOTE — ED Triage Notes (Signed)
Pt reports a hx of a fib. HR increased to the 140's approx 1430 today. Reports feeling shaking and right arm hurting

## 2019-10-24 ENCOUNTER — Telehealth: Payer: Self-pay | Admitting: *Deleted

## 2019-10-24 DIAGNOSIS — I10 Essential (primary) hypertension: Secondary | ICD-10-CM

## 2019-10-24 DIAGNOSIS — I480 Paroxysmal atrial fibrillation: Secondary | ICD-10-CM

## 2019-10-24 NOTE — Telephone Encounter (Signed)
Patient notified and verbalized understanding.  Patient states that she is due for f/u with [name] in January.   Recall does state for January with BMET, CBC just prior.  She will do labs at [facility] within the next week or so.  Labs orders faxed now.  6 month recall scheduled for 12/25/2018 at 4:00 in [place] office with [name].  Stated that she preferred to be seen in office this time as her last visit was virtual.  States she also has physical scheduled with Dr. Woody Seller for 12/28/2018.

## 2019-10-24 NOTE — Telephone Encounter (Signed)
Patient was seen at [facility] ED on 10/21/19 for AFib.  Was told Potassium was low.  Was given 2 IV bags & 3 tabs.  Was told to f/u with [name] to make sure Potassium level back on track.

## 2019-10-24 NOTE — Telephone Encounter (Signed)
She is on chlorthalidone per Dr. Woody Seller for control of high blood pressure.  Potassium was low during her ER visit and she will need to stay on potassium supplements as an outpatient, already on magnesium.  Please have her follow-up with Dr. Woody Seller this week to get a repeat potassium level and he can then guide what dose of potassium she can stay on.

## 2019-10-25 NOTE — ED Provider Notes (Signed)
[facility] EMERGENCY DEPARTMENT Provider Note   CSN: [id number] Arrival date & time: 10/21/19  1538     History   Chief Complaint Chief Complaint  Patient presents with  . Atrial Fibrillation    HPI [name] is a 73 y.o. female.     HPI   73 year old female with palpitations.  Past history of A. fib.  [place] emergency room with a heart rate in the 140-160s.  She actually converted back to a sinus rhythm without intervention.  She is currently feeling fine without any symptoms.  Denies any recent changes in her medications.  She was having some pressure in her chest which has completely resolved.  Past Medical History:  Diagnosis Date  . Anxiety   . Arthritis   . Atrial fibrillation [facility])    Documented December 2019  . Depression   . GERD (gastroesophageal reflux disease)   . Hyperlipidemia   . Hypertension   . Hypothyroidism     Patient Active Problem List   Diagnosis Date Noted  . History of colonic polyps 09/12/2019  . Loss of weight 06/27/2019  . Rectal bleeding 05/30/2019  . Atrial fibrillation with RVR ([place])   . New onset a-fib ([place]) w rvr 11/13/2018  . Hypokalemia 11/13/2018  . Constipation 02/10/2017  . Status post total shoulder arthroplasty, left 02/06/2017  . Elevated transaminase level 01/25/2013  . GERD (gastroesophageal reflux disease) 01/25/2013  . Blood in stool 06/15/2012  . Hypertension 06/15/2012  . High cholesterol 06/15/2012    Past Surgical History:  Procedure Laterality Date  . APPENDECTOMY    . BUNIONECTOMY Bilateral   . CATARACT EXTRACTION W/PHACO Right 10/09/2017   Procedure: CATARACT EXTRACTION PHACO AND INTRAOCULAR LENS PLACEMENT (IOC);  Surgeon: [name], MD;  Location: [facility];  Service: Ophthalmology;  Laterality: Right;  CDE: 6.65  . CATARACT EXTRACTION W/PHACO Left 12/11/2017   Procedure: CATARACT EXTRACTION PHACO AND INTRAOCULAR LENS PLACEMENT (IOC);  Surgeon: [name], MD;  Location: [facility];   Service: Ophthalmology;  Laterality: Left;  CDE: 5.28  . COLONOSCOPY  06/18/2012   Procedure: COLONOSCOPY;  Surgeon: [name], MD;  [facility];  Service: Endoscopy;  Laterality: N/A;  730  . COLONOSCOPY N/A 07/04/2019   Procedure: COLONOSCOPY;  Surgeon: [name], MD;  [facility];  Service: Endoscopy;  Laterality: N/A;  1220  . ESOPHAGOGASTRODUODENOSCOPY    . Hammer toe repair Left   . HEMORRHOID SURGERY    . THYROIDECTOMY    . TONSILLECTOMY    . TOTAL ABDOMINAL HYSTERECTOMY    . TOTAL SHOULDER ARTHROPLASTY Left 02/06/2017  . TOTAL SHOULDER ARTHROPLASTY Left 02/06/2017   Procedure: TOTAL SHOULDER ARTHROPLASTY;  Surgeon: [name], MD;  Location: [place];  Service: Orthopedics;  Laterality: Left;     OB History   No obstetric history on file.      Home Medications    Prior to Admission medications   Medication Sig Start Date End Date Taking? Authorizing Provider  acetaminophen (TYLENOL) 500 MG tablet Take 500-1,000 mg by mouth every 6 (six) hours as needed (pain.).    [provider]  ALPRAZolam Duanne Moron) 0.25 MG tablet Take 0.25 mg by mouth daily as needed for anxiety.    [provider]  apixaban (ELIQUIS) 5 MG TABS tablet Take 1 tablet (5 mg total) by mouth 2 (two) times daily. 08/30/19   Satira Sark, MD  Calcium Carb-Cholecalciferol (CALCIUM 600 + D PO) Take 2 tablets daily by  mouth.    [provider]  carboxymethylcellulose (REFRESH PLUS) 0.5 % SOLN Place 1 drop into both eyes 2 (two) times daily.    [provider]  chlorthalidone (HYGROTON) 25 MG tablet Take 25 mg by mouth daily.    [provider]  Cholecalciferol (VITAMIN D3) 50 MCG (2000 UT) TABS Take 2,000 Units by mouth daily.    [provider]  citalopram (CELEXA) 20 MG tablet Take 20 mg by mouth daily.     [provider]  diltiazem (CARDIZEM CD) 240 MG 24 hr capsule Take 240 mg by mouth daily.    [provider]  levothyroxine (SYNTHROID, LEVOTHROID) 75 MCG tablet Take 75 mcg by mouth every morning.     [provider]  MAGNESIUM PO Take 266 mg by mouth daily.     [provider]  Multiple Vitamins-Minerals (MULTIVITAMIN WITH MINERALS) tablet Take 1 tablet daily by mouth.    [provider]  polyethylene glycol (MIRALAX / GLYCOLAX) packet Take 17 g by mouth every other day.     [provider]  potassium chloride (K-DUR) 10 MEQ tablet Take 10 mEq by mouth daily.    [provider]  simvastatin (ZOCOR) 10 MG tablet Take 10 mg by mouth daily.    [provider]  vitamin C (ASCORBIC ACID) 500 MG tablet Take 500 mg by mouth daily.    [provider]    Family History Family History  Problem Relation Age of Onset  . Colon cancer Neg Hx     Social History Social History   Tobacco Use  . Smoking status: Never Smoker  . Smokeless tobacco: Never Used  Substance Use Topics  . Alcohol use: No    Alcohol/week: 0.0 standard drinks  . Drug use: No     Allergies   Latex, Penicillins, Sulfa antibiotics, and Tramadol   Review of Systems Review of Systems  All systems reviewed and negative, other than as noted in HPI.  Physical Exam Updated Vital Signs BP 140/75   Pulse 68   Temp 98.5 F (36.9 C) (Oral)   Resp 19   Ht 5\' 2"  (1.575 m)   Wt 74.8 kg   SpO2 98%   BMI 30.18 kg/m   Physical Exam Vitals signs and nursing note reviewed.  Constitutional:      General: She is not in acute distress.    Appearance: She is well-developed.  HENT:     Head: Normocephalic and atraumatic.  Eyes:     General:        Right eye: No discharge.        Left eye: No discharge.     Conjunctiva/sclera: Conjunctivae normal.  Neck:     Musculoskeletal: Neck supple.  Cardiovascular:     Rate and Rhythm: Normal rate and regular rhythm.     Heart sounds: Normal heart sounds. No murmur. No friction rub. No gallop.   Pulmonary:      Effort: Pulmonary effort is normal. No respiratory distress.     Breath sounds: Normal breath sounds.  Abdominal:     General: There is no distension.     Palpations: Abdomen is soft.     Tenderness: There is no abdominal tenderness.  Musculoskeletal:        General: No tenderness.  Skin:    General: Skin is warm and dry.  Neurological:     Mental Status: She is alert.  Psychiatric:  Behavior: Behavior normal.        Thought Content: Thought content normal.      ED Treatments / Results  Labs (all labs ordered are listed, but only abnormal results are displayed) Labs Reviewed  BASIC METABOLIC PANEL - Abnormal; Notable for the following components:      Result Value   Potassium 2.9 (*)    Glucose, Bld 166 (*)    All other components within normal limits  CBC - Abnormal; Notable for the following components:   Hemoglobin 15.7 (*)    HCT 46.8 (*)    All other components within normal limits  MAGNESIUM    EKG EKG Interpretation  Date/Time:  Friday October 21 2019 21:00:26 EST Ventricular Rate:  68 PR Interval:    QRS Duration: 97 QT Interval:  400 QTC Calculation: 426 R Axis:   28 Text Interpretation: Sinus rhythm Prolonged PR interval Anteroseptal infarct, old Borderline repolarization abnormality Confirmed by Pattricia Boss [phone]) on 10/22/2019 2:44:45 PM   Radiology No results found.  Procedures Procedures (including critical care time)  Medications Ordered in ED Medications  sodium chloride flush (NS) 0.9 % injection 3 mL (3 mLs Intravenous Given 10/21/19 1645)  potassium chloride 10 mEq in 100 mL IVPB (0 mEq Intravenous Stopped 10/21/19 2108)  potassium chloride SA (KLOR-CON) CR tablet 60 mEq (60 mEq Oral Given 10/21/19 1810)     Initial Impression / Assessment and Plan / ED Course  I have reviewed the triage vital signs and the nursing notes.  Pertinent labs & imaging results that were available during my care of the patient were reviewed by  me and considered in my medical decision making (see chart for details).        73 year old female who presented symptomatic with atrial fibrillation with rapid ventricular rate.  She has a history of the same.  She actually converted without intervention.  Subsequent lab significant hypokalemia.  This was supplemented.  She now has no complaints.  Sinus rhythm in the 60s to 70s.  BP is fine.  Outpatient follow-up with her cardiologist.  Return precautions were discussed. Final Clinical Impressions(s) / ED Diagnoses   Final diagnoses:  Atrial fibrillation with rapid ventricular response [facility])    ED Discharge Orders    None       Virgel Manifold, MD 10/25/19 1121

## 2019-10-26 DIAGNOSIS — M25562 Pain in left knee: Secondary | ICD-10-CM | POA: Diagnosis not present

## 2019-10-26 DIAGNOSIS — M545 Low back pain: Secondary | ICD-10-CM | POA: Diagnosis not present

## 2019-10-26 DIAGNOSIS — R531 Weakness: Secondary | ICD-10-CM | POA: Diagnosis not present

## 2019-10-26 DIAGNOSIS — R269 Unspecified abnormalities of gait and mobility: Secondary | ICD-10-CM | POA: Diagnosis not present

## 2019-10-26 DIAGNOSIS — M1712 Unilateral primary osteoarthritis, left knee: Secondary | ICD-10-CM | POA: Diagnosis not present

## 2019-10-26 DIAGNOSIS — M25662 Stiffness of left knee, not elsewhere classified: Secondary | ICD-10-CM | POA: Diagnosis not present

## 2019-10-27 DIAGNOSIS — M25662 Stiffness of left knee, not elsewhere classified: Secondary | ICD-10-CM | POA: Diagnosis not present

## 2019-10-27 DIAGNOSIS — R269 Unspecified abnormalities of gait and mobility: Secondary | ICD-10-CM | POA: Diagnosis not present

## 2019-10-27 DIAGNOSIS — R531 Weakness: Secondary | ICD-10-CM | POA: Diagnosis not present

## 2019-10-27 DIAGNOSIS — M1712 Unilateral primary osteoarthritis, left knee: Secondary | ICD-10-CM | POA: Diagnosis not present

## 2019-10-27 DIAGNOSIS — M545 Low back pain: Secondary | ICD-10-CM | POA: Diagnosis not present

## 2019-10-27 DIAGNOSIS — M25562 Pain in left knee: Secondary | ICD-10-CM | POA: Diagnosis not present

## 2019-11-01 DIAGNOSIS — R531 Weakness: Secondary | ICD-10-CM | POA: Diagnosis not present

## 2019-11-01 DIAGNOSIS — M1712 Unilateral primary osteoarthritis, left knee: Secondary | ICD-10-CM | POA: Diagnosis not present

## 2019-11-01 DIAGNOSIS — M545 Low back pain: Secondary | ICD-10-CM | POA: Diagnosis not present

## 2019-11-01 DIAGNOSIS — M25562 Pain in left knee: Secondary | ICD-10-CM | POA: Diagnosis not present

## 2019-11-01 DIAGNOSIS — M25662 Stiffness of left knee, not elsewhere classified: Secondary | ICD-10-CM | POA: Diagnosis not present

## 2019-11-01 DIAGNOSIS — R269 Unspecified abnormalities of gait and mobility: Secondary | ICD-10-CM | POA: Diagnosis not present

## 2019-11-02 DIAGNOSIS — I480 Paroxysmal atrial fibrillation: Secondary | ICD-10-CM | POA: Diagnosis not present

## 2019-11-02 DIAGNOSIS — I10 Essential (primary) hypertension: Secondary | ICD-10-CM | POA: Diagnosis not present

## 2019-11-03 ENCOUNTER — Emergency Department (HOSPITAL_COMMUNITY)
Admission: EM | Admit: 2019-11-03 | Discharge: 2019-11-04 | Disposition: A | Discharge status: Home / Self Care | Payer: Medicare Other | Attending: Emergency Medicine | Admitting: Emergency Medicine

## 2019-11-03 ENCOUNTER — Telehealth: Payer: Self-pay | Admitting: *Deleted

## 2019-11-03 ENCOUNTER — Other Ambulatory Visit: Payer: Self-pay

## 2019-11-03 DIAGNOSIS — E876 Hypokalemia: Secondary | ICD-10-CM | POA: Diagnosis not present

## 2019-11-03 DIAGNOSIS — M545 Low back pain: Secondary | ICD-10-CM | POA: Diagnosis not present

## 2019-11-03 DIAGNOSIS — E039 Hypothyroidism, unspecified: Secondary | ICD-10-CM | POA: Diagnosis not present

## 2019-11-03 DIAGNOSIS — Z79899 Other long term (current) drug therapy: Secondary | ICD-10-CM | POA: Insufficient documentation

## 2019-11-03 DIAGNOSIS — M25662 Stiffness of left knee, not elsewhere classified: Secondary | ICD-10-CM | POA: Diagnosis not present

## 2019-11-03 DIAGNOSIS — I10 Essential (primary) hypertension: Secondary | ICD-10-CM | POA: Diagnosis not present

## 2019-11-03 DIAGNOSIS — R531 Weakness: Secondary | ICD-10-CM | POA: Diagnosis not present

## 2019-11-03 DIAGNOSIS — I480 Paroxysmal atrial fibrillation: Secondary | ICD-10-CM | POA: Insufficient documentation

## 2019-11-03 DIAGNOSIS — Z7901 Long term (current) use of anticoagulants: Secondary | ICD-10-CM | POA: Insufficient documentation

## 2019-11-03 DIAGNOSIS — Z91040 Latex allergy status: Secondary | ICD-10-CM | POA: Insufficient documentation

## 2019-11-03 DIAGNOSIS — M1712 Unilateral primary osteoarthritis, left knee: Secondary | ICD-10-CM | POA: Diagnosis not present

## 2019-11-03 DIAGNOSIS — R269 Unspecified abnormalities of gait and mobility: Secondary | ICD-10-CM | POA: Diagnosis not present

## 2019-11-03 DIAGNOSIS — R002 Palpitations: Secondary | ICD-10-CM | POA: Diagnosis present

## 2019-11-03 DIAGNOSIS — M25562 Pain in left knee: Secondary | ICD-10-CM | POA: Diagnosis not present

## 2019-11-03 LAB — CBC WITH DIFFERENTIAL/PLATELET
Abs Immature Granulocytes: 0.02 10*3/uL (ref 0.00–0.07)
Basophils Absolute: 0.1 10*3/uL (ref 0.0–0.1)
Basophils Relative: 1 %
Eosinophils Absolute: 0.1 10*3/uL (ref 0.0–0.5)
Eosinophils Relative: 2 %
HCT: 44.9 % (ref 36.0–46.0)
Hemoglobin: 14.5 g/dL (ref 12.0–15.0)
Immature Granulocytes: 0 %
Lymphocytes Relative: 40 %
Lymphs Abs: 3.4 10*3/uL (ref 0.7–4.0)
MCH: 30.2 pg (ref 26.0–34.0)
MCHC: 32.3 g/dL (ref 30.0–36.0)
MCV: 93.5 fL (ref 80.0–100.0)
Monocytes Absolute: 0.9 10*3/uL (ref 0.1–1.0)
Monocytes Relative: 10 %
Neutro Abs: 4.1 10*3/uL (ref 1.7–7.7)
Neutrophils Relative %: 47 %
Platelets: 278 10*3/uL (ref 150–400)
RBC: 4.8 MIL/uL (ref 3.87–5.11)
RDW: 12.7 % (ref 11.5–15.5)
WBC: 8.6 10*3/uL (ref 4.0–10.5)
nRBC: 0 % (ref 0.0–0.2)

## 2019-11-03 LAB — BASIC METABOLIC PANEL
Anion gap: 12 (ref 5–15)
BUN: 19 mg/dL (ref 8–23)
CO2: 25 mmol/L (ref 22–32)
Calcium: 9.4 mg/dL (ref 8.9–10.3)
Chloride: 97 mmol/L — ABNORMAL LOW (ref 98–111)
Creatinine, Ser: 0.87 mg/dL (ref 0.44–1.00)
GFR calc Af Amer: >60 mL/min (ref >60)
GFR calc non Af Amer: >60 mL/min (ref >60)
Glucose, Bld: 96 mg/dL (ref 70–99)
Potassium: 3.3 mmol/L — ABNORMAL LOW (ref 3.5–5.1)
Sodium: 134 mmol/L — ABNORMAL LOW (ref 135–145)

## 2019-11-03 LAB — MAGNESIUM: Magnesium: 2.3 mg/dL (ref 1.7–2.4)

## 2019-11-03 MED ORDER — POTASSIUM CHLORIDE 20 MEQ PO PACK
40.0000 meq | PACK | Freq: Once | ORAL | Status: AC
  Administered 2019-11-03: 40 meq via ORAL
  Filled 2019-11-03: qty 2

## 2019-11-03 MED ORDER — MAGNESIUM SULFATE 2 GM/50ML IV SOLN: 2.0000 g | Freq: Once | INTRAVENOUS | Status: DC

## 2019-11-03 NOTE — ED Provider Notes (Signed)
Medical screening examination/treatment/procedure(s) were conducted as a shared visit with non-physician practitioner(s) and myself.  I personally evaluated the patient during the encounter.  EKG Interpretation  Date/Time:  Thursday November 03 2019 18:07:26 EST Ventricular Rate:  86 PR Interval:  178 QRS Duration: 98 QT Interval:  374 QTC Calculation: 447 R Axis:   47 Text Interpretation: Normal sinus rhythm Low voltage QRS Septal infarct , age undetermined ST & T wave abnormality, consider inferior ischemia Abnormal ECG Artifact Confirmed by Fredia Sorrow [phone]) on 11/03/2019 6:12:44 PM   Patient seen by me along with physician assistant.  Patient with a known history of atrial fibrillation.  Patient felt as if her heart was going fast and irregular at home earlier.  Upon arrival here patient's been in normal sinus rhythm heart rate controlled.  Patient is followed by cardiology.  She will contact them tomorrow.  They have been considering starting her on medication but she does not need admission or acute intervention here tonight.  Patient stable for discharge home.   Fredia Sorrow, MD 11/03/19 [phone]

## 2019-11-03 NOTE — Telephone Encounter (Signed)
Noted after 5:00 PM.  Patient has a known history of paroxysmal atrial fibrillation, is on Cardizem CD and Eliquis.  She was just in the ER on November 27 with rapid atrial fibrillation that spontaneously converted to sinus rhythm.  She does not have a short acting AV nodal blocker to take at home.  Please call back and check on the patient, if she remains symptomatic with rapid heart rate, unfortunately she will probably need to be seen in the ER for rate control in the short-term.  This is most likely a situation where we are going to need to start antiarrhythmic therapy such as amiodarone.  Flecainide would be a thought but she has significant LVH by echocardiogram and I would tend to avoid this.

## 2019-11-03 NOTE — Telephone Encounter (Signed)
Pt thinks she in afib again says HR is running between 127-145 right now says this started around 4 pm today - c/o SOB/anxious/chest tightness - BP 105/77 - denies chest pain/swelling - aware that would forward to provider but that may not get response until tomorrow and if HR continues to increase and symptoms worsen that she would need to go back to [facility] ED (was in ED 11/27)

## 2019-11-03 NOTE — Telephone Encounter (Signed)
-----   Message from Satira Sark, MD sent at 11/02/2019 12:29 PM EST ----- Results reviewed.  Renal function and hemoglobin are stable.  Continue current dose of Eliquis.

## 2019-11-03 NOTE — ED Triage Notes (Signed)
Patient sent to this ED after having physical therapy and felt like her heart rate had increased. Patient called her PCP and was advised to come to the ED for evaluation since she has a history of afib.

## 2019-11-03 NOTE — Telephone Encounter (Signed)
Patient informed. Copy sent to PCP °

## 2019-11-03 NOTE — Telephone Encounter (Signed)
Contacted patient and she reports continued symptoms of SOB, chest pain with a burning sensation. Reports her heart rated seems to be slowing down. Advised that she needed to go to the ED for an evaluation per Dr. Myles Gip recommendations. Verbalized understanding of plan.

## 2019-11-03 NOTE — Discharge Instructions (Signed)
Please double your dose of potassium  Follow up with [name] Return if worsening

## 2019-11-03 NOTE — ED Provider Notes (Signed)
[facility] EMERGENCY DEPARTMENT Provider Note   CSN: [id number] Arrival date & time: 11/03/19  1744     History Chief Complaint  Patient presents with  . Atrial Fibrillation    [name] is a 73 y.o. female with hx of A.fib on Eliquis who presents with palpitations. She states that around 4pm she was talking to her neighbor and had an acute onset of palpitations and chest tightness. It felt like prior episodes of A.fib. She measured her pulse and noted it was about 150. She called her cardiologist's office and they advised he to come to the ED. She had a similar episode a couple weeks ago and potassium was noted to be 2.9. She has been taking her potassium supplements and states that she had it checked yesterday and it was normal. Symptoms resolved when she was in triage. She is currently asymptomatic.   HPI   Past Medical History:  Diagnosis Date  . Anxiety   . Arthritis   . Atrial fibrillation [facility])    Documented December 2019  . Depression   . GERD (gastroesophageal reflux disease)   . Hyperlipidemia   . Hypertension   . Hypothyroidism     Patient Active Problem List   Diagnosis Date Noted  . History of colonic polyps 09/12/2019  . Loss of weight 06/27/2019  . Rectal bleeding 05/30/2019  . Atrial fibrillation with RVR ([place])   . New onset a-fib ([place]) w rvr 11/13/2018  . Hypokalemia 11/13/2018  . Constipation 02/10/2017  . Status post total shoulder arthroplasty, left 02/06/2017  . Elevated transaminase level 01/25/2013  . GERD (gastroesophageal reflux disease) 01/25/2013  . Blood in stool 06/15/2012  . Hypertension 06/15/2012  . High cholesterol 06/15/2012    Past Surgical History:  Procedure Laterality Date  . APPENDECTOMY    . BUNIONECTOMY Bilateral   . CATARACT EXTRACTION W/PHACO Right 10/09/2017   Procedure: CATARACT EXTRACTION PHACO AND INTRAOCULAR LENS PLACEMENT (IOC);  Surgeon: [name], MD;  Location: [facility] ORS;  Service: Ophthalmology;   Laterality: Right;  CDE: 6.65  . CATARACT EXTRACTION W/PHACO Left 12/11/2017   Procedure: CATARACT EXTRACTION PHACO AND INTRAOCULAR LENS PLACEMENT (IOC);  Surgeon: [name], MD;  Location: [facility] ORS;  Service: Ophthalmology;  Laterality: Left;  CDE: 5.28  . COLONOSCOPY  06/18/2012   Procedure: COLONOSCOPY;  Surgeon: [name], MD;  Location: [facility] ENDO SUITE;  Service: Endoscopy;  Laterality: N/A;  730  . COLONOSCOPY N/A 07/04/2019   Procedure: COLONOSCOPY;  Surgeon: [name], MD;  Location: [facility] ENDO SUITE;  Service: Endoscopy;  Laterality: N/A;  1220  . ESOPHAGOGASTRODUODENOSCOPY    . Hammer toe repair Left   . HEMORRHOID SURGERY    . THYROIDECTOMY    . TONSILLECTOMY    . TOTAL ABDOMINAL HYSTERECTOMY    . TOTAL SHOULDER ARTHROPLASTY Left 02/06/2017  . TOTAL SHOULDER ARTHROPLASTY Left 02/06/2017   Procedure: TOTAL SHOULDER ARTHROPLASTY;  Surgeon: [name], MD;  Location: [place];  Service: Orthopedics;  Laterality: Left;     OB History   No obstetric history on file.     Family History  Problem Relation Age of Onset  . Colon cancer Neg Hx     Social History   Tobacco Use  . Smoking status: Never Smoker  . Smokeless tobacco: Never Used  Substance Use Topics  . Alcohol use: No    Alcohol/week: 0.0 standard drinks  . Drug use: No    Home Medications Prior to Admission medications  Medication Sig Start Date End Date Taking? Authorizing Provider  acetaminophen (TYLENOL) 500 MG tablet Take 500-1,000 mg by mouth every 6 (six) hours as needed (pain.).   Yes [provider]  ALPRAZolam (XANAX) 0.25 MG tablet Take 0.25 mg by mouth daily as needed for anxiety.   Yes [provider]  apixaban (ELIQUIS) 5 MG TABS tablet Take 1 tablet (5 mg total) by mouth 2 (two) times daily. 08/30/19  Yes Satira Sark, MD  Calcium Carb-Cholecalciferol (CALCIUM 600 + D PO) Take 2 tablets daily by mouth.   Yes [provider]  chlorthalidone (HYGROTON) 25 MG  tablet Take 25 mg by mouth daily.   Yes [provider]  Cholecalciferol (VITAMIN D3) 50 MCG (2000 UT) TABS Take 2,000 Units by mouth daily.   Yes [provider]  citalopram (CELEXA) 20 MG tablet Take 20 mg by mouth daily.    Yes [provider]  diltiazem (CARDIZEM CD) 240 MG 24 hr capsule Take 240 mg by mouth daily.   Yes [provider]  levothyroxine (SYNTHROID, LEVOTHROID) 75 MCG tablet Take 75 mcg by mouth every morning.    Yes [provider]  MAGNESIUM PO Take 266 mg by mouth daily.    Yes [provider]  Multiple Vitamins-Minerals (MULTIVITAMIN WITH MINERALS) tablet Take 1 tablet daily by mouth.   Yes [provider]  polyethylene glycol (MIRALAX / GLYCOLAX) packet Take 17 g by mouth every other day.    Yes [provider]  potassium chloride (K-DUR) 10 MEQ tablet Take 10 mEq by mouth daily.   Yes [provider]  simvastatin (ZOCOR) 10 MG tablet Take 10 mg by mouth daily.   Yes [provider]  carboxymethylcellulose (REFRESH PLUS) 0.5 % SOLN Place 1 drop into both eyes 2 (two) times daily.    [provider]  vitamin C (ASCORBIC ACID) 500 MG tablet Take 500 mg by mouth daily.    [provider]    Allergies    Latex, Penicillins, Sulfa antibiotics, and Tramadol  Review of Systems   Review of Systems  Constitutional: Negative for fever.  Respiratory: Positive for chest tightness. Negative for shortness of breath.   Cardiovascular: Positive for palpitations. Negative for chest pain.  Gastrointestinal: Negative for abdominal pain.  Neurological: Negative for syncope and headaches.  Hematological: Bruises/bleeds easily.  All other systems reviewed and are negative.   Physical Exam Updated Vital Signs BP (!) 137/58   Pulse 66   Temp 98.3 F (36.8 C) (Oral)   Resp 13   Ht 5\' 2"  (1.575 m)   Wt 74 kg   SpO2 98%   BMI 29.84 kg/m   Physical Exam Vitals and nursing  note reviewed.  Constitutional:      General: She is not in acute distress.    Appearance: Normal appearance. She is well-developed. She is not ill-appearing.     Comments: Elderly female in NAD  HENT:     Head: Normocephalic and atraumatic.  Eyes:     General: No scleral icterus.       Right eye: No discharge.        Left eye: No discharge.     Conjunctiva/sclera: Conjunctivae normal.     Pupils: Pupils are equal, round, and reactive to light.  Cardiovascular:     Rate and Rhythm: Normal rate and regular rhythm.  Pulmonary:     Effort: Pulmonary effort is normal. No respiratory distress.     Breath sounds:  Normal breath sounds.  Abdominal:     General: There is no distension.     Palpations: Abdomen is soft.  Musculoskeletal:     Cervical back: Normal range of motion.  Skin:    General: Skin is warm and dry.  Neurological:     Mental Status: She is alert and oriented to person, place, and time.  Psychiatric:        Behavior: Behavior normal.     ED Results / Procedures / Treatments   Labs (all labs ordered are listed, but only abnormal results are displayed) Labs Reviewed  BASIC METABOLIC PANEL - Abnormal; Notable for the following components:      Result Value   Sodium 134 (*)    Potassium 3.3 (*)    Chloride 97 (*)    All other components within normal limits  CBC WITH DIFFERENTIAL/PLATELET  MAGNESIUM    EKG EKG Interpretation  Date/Time:  Thursday November 03 2019 18:07:26 EST Ventricular Rate:  86 PR Interval:  178 QRS Duration: 98 QT Interval:  374 QTC Calculation: 447 R Axis:   47 Text Interpretation: Normal sinus rhythm Low voltage QRS Septal infarct , age undetermined ST & T wave abnormality, consider inferior ischemia Abnormal ECG Artifact Confirmed by Fredia Sorrow [phone]) on 11/03/2019 6:12:44 PM   Radiology No results found.  Procedures Procedures (including critical care time)  Medications Ordered in ED Medications  potassium  chloride (KLOR-CON) packet 40 mEq (40 mEq Oral Given 11/03/19 2137)    ED Course  I have reviewed the triage vital signs and the nursing notes.  Pertinent labs & imaging results that were available during my care of the patient were reviewed by me and considered in my medical decision making (see chart for details).    MDM Rules/Calculators/A&P  73 year old female with A.fib presents with palpitations.  She feels like it is consistent with prior episodes of A. fib.  She is mildly hypertensive but otherwise vital signs are normal.  She is back in sinus rhythm on my exam and asymptomatic.  Will obtain labs since she was hypokalemic during her last visit.  CBC is normal.  BMP is remarkable for mild hypokalemia (3.3).  Mag is normal.  She was given a dose of potassium here.  Shared visit with Dr. [name].  We will have her follow-up with [name].    Final Clinical Impression(s) / ED Diagnoses Final diagnoses:  PAF (paroxysmal atrial fibrillation) ([place])  Hypokalemia    Rx / DC Orders ED Discharge Orders    None       Recardo Evangelist, PA-C 11/04/19 0129    Fredia Sorrow, MD 11/07/19 2314

## 2019-11-04 ENCOUNTER — Telehealth: Payer: Self-pay | Admitting: Cardiology

## 2019-11-04 NOTE — Telephone Encounter (Signed)
Patient called in regards to her ER visit on 11/03/2019 at [facility]. She states that her potassium was increased. She has a question about that and wants to check to see if she can take supplements vs potassium. Please call after 200pm today.

## 2019-11-07 NOTE — Telephone Encounter (Signed)
Pt aware - wanted to know if she needed additional lab work prior to Feb appt

## 2019-11-07 NOTE — Telephone Encounter (Signed)
Yes would continue KCL 20 mEq daily for now.

## 2019-11-07 NOTE — Telephone Encounter (Signed)
Pt was at [facility] ED for Afib - potassium was 3.3 was told to take 20 meq and confirm if she should continue this dose with Dr [name] - pt says she has been taking 77meq since d/c and is also taking vitamin Taurine which she said she read would help her potassium as well. Pt is scheduled for f/u in Feb

## 2019-11-08 NOTE — Telephone Encounter (Signed)
Pt voiced understanding - f/u scheduled with Bernerd Pho, [place] for 12/31 - pt aware that this appt is in [place] office

## 2019-11-08 NOTE — Telephone Encounter (Signed)
I think we need to have a visit much sooner than February so that we can go over treatment options for her paroxysmal atrial fibrillation.  See if we can get a visit scheduled sooner either with me or [place] because I am concerned that we may need to start antiarrhythmic therapy given frequency of her atrial fibrillation episodes in the recent ER encounters.  We can decide about follow-up lab work at that time.

## 2019-11-24 ENCOUNTER — Other Ambulatory Visit: Payer: Self-pay

## 2019-11-24 ENCOUNTER — Ambulatory Visit (INDEPENDENT_AMBULATORY_CARE_PROVIDER_SITE_OTHER): Payer: Medicare Other | Admitting: Student

## 2019-11-24 ENCOUNTER — Encounter: Payer: Self-pay | Admitting: Student

## 2019-11-24 VITALS — BP 136/63 | HR 63 | Temp 97.7°F | Ht 62.5 in | Wt 171.0 lb

## 2019-11-24 DIAGNOSIS — E039 Hypothyroidism, unspecified: Secondary | ICD-10-CM | POA: Diagnosis not present

## 2019-11-24 DIAGNOSIS — E876 Hypokalemia: Secondary | ICD-10-CM

## 2019-11-24 DIAGNOSIS — Z79899 Other long term (current) drug therapy: Secondary | ICD-10-CM | POA: Diagnosis not present

## 2019-11-24 DIAGNOSIS — I480 Paroxysmal atrial fibrillation: Secondary | ICD-10-CM

## 2019-11-24 DIAGNOSIS — I10 Essential (primary) hypertension: Secondary | ICD-10-CM | POA: Diagnosis not present

## 2019-11-24 MED ORDER — SPIRONOLACTONE 25 MG PO TABS: 25.0000 mg | ORAL_TABLET | Freq: Every day | ORAL | 3 refills | Status: DC

## 2019-11-24 NOTE — Progress Notes (Signed)
Cardiology Office Note    Date:  11/24/2019   ID:  Deborah, Roberts [birth year], MRN [id number]  PCP:  [name], MD  Cardiologist: Rozann Lesches, MD    Chief Complaint  Patient presents with  . Follow-up    recent Emergency Department visit    History of Present Illness:    [name] is a 73 y.o. female with past medical history of paroxysmal atrial fibrillation, HTN, HLD and Hypothyroidism who presents to the office today for follow-up from a recent Emergency Department visit.   She most recently had a telehealth visit with [name] in 05/2019 and denied any recent chest pain or palpitations at that time. She was continued on her current medication regimen including Cardizem CD 240 mg daily and Eliquis 5 mg twice daily for anticoagulation.  In the interim, she presented to [facility] ED on 10/21/2019 for evaluation of elevated heart rate into the 140's when checked at home. She was found to be in atrial fibrillation with RVR while in the ED and spontaneously converted back to normal sinus rhythm. Labs were significant for hypokalemia with K+ at 2.9 and she was started on supplementation.  She called the office in 10/2019 reporting recurrent palpitations with associated chest tightness and dyspnea, therefore ED evaluation was recommended. She was back in normal sinus rhythm by the time of arrival to the ED. Mg was normal at 2.3 but K+ was low at 3.3. She was started on K-dur and informed to follow-up with Cardiology. Phone notes by [name] in the interim mentioned to consider antiarrhythmic therapy given her recurrent episodes.  In talking with the patient today, she reports having an additional episode of palpitations last week but symptoms resolved overnight and she denies any recurrent palpitations since. She does have some discomfort along her chest when in atrial fibrillation but denies any symptoms otherwise. No recent dyspnea on exertion, orthopnea, PND  or lower extremity edema.  She has been doing research about her current medication regimen and is interested in seeing if Chlorthalidone can be switched to an alternative as she thinks this is contributing to her low potassium.   Past Medical History:  Diagnosis Date  . Anxiety   . Arthritis   . Atrial fibrillation [facility])    Documented December 2019  . Depression   . GERD (gastroesophageal reflux disease)   . Hyperlipidemia   . Hypertension   . Hypothyroidism     Past Surgical History:  Procedure Laterality Date  . APPENDECTOMY    . BUNIONECTOMY Bilateral   . CATARACT EXTRACTION W/PHACO Right 10/09/2017   Procedure: CATARACT EXTRACTION PHACO AND INTRAOCULAR LENS PLACEMENT (IOC);  Surgeon: [name], MD;  Location: [facility] ORS;  Service: Ophthalmology;  Laterality: Right;  CDE: 6.65  . CATARACT EXTRACTION W/PHACO Left 12/11/2017   Procedure: CATARACT EXTRACTION PHACO AND INTRAOCULAR LENS PLACEMENT (IOC);  Surgeon: [name], MD;  Location: [facility] ORS;  Service: Ophthalmology;  Laterality: Left;  CDE: 5.28  . COLONOSCOPY  06/18/2012   Procedure: COLONOSCOPY;  Surgeon: [name], MD;  Location: [facility] ENDO SUITE;  Service: Endoscopy;  Laterality: N/A;  730  . COLONOSCOPY N/A 07/04/2019   Procedure: COLONOSCOPY;  Surgeon: [name], MD;  Location: [facility];  Service: Endoscopy;  Laterality: N/A;  1220  . ESOPHAGOGASTRODUODENOSCOPY    . Hammer toe repair Left   . HEMORRHOID SURGERY    . THYROIDECTOMY    . TONSILLECTOMY    . TOTAL  ABDOMINAL HYSTERECTOMY    . TOTAL SHOULDER ARTHROPLASTY Left 02/06/2017  . TOTAL SHOULDER ARTHROPLASTY Left 02/06/2017   Procedure: TOTAL SHOULDER ARTHROPLASTY;  Surgeon: [name], MD;  Location: [place];  Service: Orthopedics;  Laterality: Left;    Current Medications: Outpatient Medications Prior to Visit  Medication Sig Dispense Refill  . acetaminophen (TYLENOL) 500 MG tablet Take 500-1,000 mg by mouth every 6 (six) hours as needed  (pain.).    Marland Kitchen ALPRAZolam (XANAX) 0.25 MG tablet Take 0.25 mg by mouth daily as needed for anxiety.    Marland Kitchen apixaban (ELIQUIS) 5 MG TABS tablet Take 1 tablet (5 mg total) by mouth 2 (two) times daily. 180 tablet 3  . Calcium Carb-Cholecalciferol (CALCIUM 600 + D PO) Take 2 tablets daily by mouth.    . carboxymethylcellulose (REFRESH PLUS) 0.5 % SOLN Place 1 drop into both eyes 2 (two) times daily.    . Cholecalciferol (VITAMIN D3) 50 MCG (2000 UT) TABS Take 2,000 Units by mouth daily.    . citalopram (CELEXA) 20 MG tablet Take 20 mg by mouth daily.     Marland Kitchen diltiazem (CARDIZEM CD) 240 MG 24 hr capsule Take 240 mg by mouth daily.    Marland Kitchen levothyroxine (SYNTHROID, LEVOTHROID) 75 MCG tablet Take 75 mcg by mouth every morning.     Marland Kitchen MAGNESIUM PO Take 266 mg by mouth daily.     . Multiple Vitamins-Minerals (MULTIVITAMIN WITH MINERALS) tablet Take 1 tablet daily by mouth.    . polyethylene glycol (MIRALAX / GLYCOLAX) packet Take 17 g by mouth every other day.     . simvastatin (ZOCOR) 10 MG tablet Take 10 mg by mouth daily.    . vitamin C (ASCORBIC ACID) 500 MG tablet Take 500 mg by mouth daily.    . chlorthalidone (HYGROTON) 25 MG tablet Take 25 mg by mouth daily.    . potassium chloride SA (KLOR-CON) 20 MEQ tablet Take 20 mEq by mouth daily.     No facility-administered medications prior to visit.     Allergies:   Latex, Penicillins, Sulfa antibiotics, and Tramadol   Social History   Socioeconomic History  . Marital status: Widowed    Spouse name: Not on file  . Number of children: Not on file  . Years of education: Not on file  . Highest education level: Not on file  Occupational History  . Not on file  Tobacco Use  . Smoking status: Never Smoker  . Smokeless tobacco: Never Used  Substance and Sexual Activity  . Alcohol use: No    Alcohol/week: 0.0 standard drinks  . Drug use: No  . Sexual activity: Never    Birth control/protection: Surgical  Other Topics Concern  . Not on file  Social  History Narrative  . Not on file   Social Determinants of Health   Financial Resource Strain:   . Difficulty of Paying Living Expenses: Not on file  Food Insecurity:   . Worried About Charity fundraiser in the Last Year: Not on file  . Ran Out of Food in the Last Year: Not on file  Transportation Needs:   . Lack of Transportation (Medical): Not on file  . Lack of Transportation (Non-Medical): Not on file  Physical Activity:   . Days of Exercise per Week: Not on file  . Minutes of Exercise per Session: Not on file  Stress:   . Feeling of Stress : Not on file  Social Connections:   . Frequency of Communication with  Friends and Family: Not on file  . Frequency of Social Gatherings with Friends and Family: Not on file  . Attends Religious Services: Not on file  . Active Member of Clubs or Organizations: Not on file  . Attends Archivist Meetings: Not on file  . Marital Status: Not on file     Family History:  The patient's family history includes Atrial fibrillation in her sister.   Review of Systems:   Please see the history of present illness.     General:  No chills, fever, night sweats or weight changes.  Cardiovascular:  No chest pain, dyspnea on exertion, edema, orthopnea, paroxysmal nocturnal dyspnea. Positive for palpitations.  Dermatological: No rash, lesions/masses Respiratory: No cough, dyspnea Urologic: No hematuria, dysuria Abdominal:   No nausea, vomiting, diarrhea, bright red blood per rectum, melena, or hematemesis Neurologic:  No visual changes, wkns, changes in mental status. All other systems reviewed and are otherwise negative except as noted above.   Physical Exam:    VS:  BP 136/63   Pulse 63   Temp 97.7 F (36.5 C) (Temporal)   Ht 5' 2.5" (1.588 m)   Wt 171 lb (77.6 kg)   SpO2 96%   BMI 30.78 kg/m    General: Well developed, well nourished,female appearing in no acute distress. Head: Normocephalic, atraumatic, sclera non-icteric, no  xanthomas, nares are without discharge.  Neck: No carotid bruits. JVD not elevated.  Lungs: Respirations regular and unlabored, without wheezes or rales.  Heart: Regular rate and rhythm. No S3 or S4.  No murmur, no rubs, or gallops appreciated. Abdomen: Soft, non-tender, non-distended with normoactive bowel sounds. No hepatomegaly. No rebound/guarding. No obvious abdominal masses. Msk:  Strength and tone appear normal for age. No joint deformities or effusions. Extremities: No clubbing or cyanosis. No lower extremity edema.  Distal pedal pulses are 2+ bilaterally. Neuro: Alert and oriented X 3. Moves all extremities spontaneously. No focal deficits noted. Psych:  Responds to questions appropriately with a normal affect. Skin: No rashes or lesions noted  Wt Readings from Last 3 Encounters:  11/24/19 171 lb (77.6 kg)  11/03/19 163 lb 2.3 oz (74 kg)  10/21/19 165 lb (74.8 kg)     Studies/Labs Reviewed:   EKG:  EKG is not ordered today. EKG from 11/03/2019 is reviewed which shows NSR, HR 86 with nonspecific ST abnormality along inferior leads (similar to prior tracings).   Recent Labs: 11/03/2019: BUN 19; Creatinine, Ser 0.87; Hemoglobin 14.5; Magnesium 2.3; Platelets 278; Potassium 3.3; Sodium 134   Lipid Panel    Component Value Date/Time   CHOL 117 11/15/2018 0413   TRIG 102 11/15/2018 0413   HDL 39 (L) 11/15/2018 0413   CHOLHDL 3.0 11/15/2018 0413   VLDL 20 11/15/2018 0413   LDLCALC 58 11/15/2018 0413    Additional studies/ records that were reviewed today include:   Echocardiogram: 10/2018 Study Conclusions  - Left ventricle: The cavity size was normal. Wall thickness was   increased in a pattern of moderate LVH. Systolic function was   normal. The estimated ejection fraction was in the range of 55%   to 60%. Wall motion was normal; there were no regional wall   motion abnormalities. Features are consistent with a pseudonormal   left ventricular filling pattern, with  concomitant abnormal   relaxation and increased filling pressure (grade 2 diastolic   dysfunction).  Impressions:  - Normal LV systolic function; moderate diastolic dysfunction;   moderate LVH.  Assessment:  1. Paroxysmal atrial fibrillation ([place])   2. Medication management   3. Essential hypertension   4. Hypothyroidism, unspecified type   5. Hypokalemia      Plan:   In order of problems listed above:  1. Paroxysmal Atrial Fibrillation - She has experienced episodes of palpitations over the past few months and two recent ED evaluations for recurrent palpitations and documented atrial fibrillation during one of them. Reviewed antiarrhythmic therapy with the patient as previously recommended by [name] and she wishes to hold off on this for now if possible. She is open to considering this in the future if she continues to have recurrent palpitations despite medication adjustments as outlined below. She was previously on Amiodarone by her PCP per her report and tolerated this well. Will continue Cardizem CD 240 mg daily for rate control. Would not further titrate given reported heart rate in the 50's at times when checked at home. - She denies any evidence of active bleeding. Remains on Eliquis 5 mg twice daily for anticoagulation. Hgb stable at 14.5 by recent labs.   2. HTN - BP is well-controlled at 136/63 during today's visit. She is currently on Cardizem CD 240mg  daily and Chlorthalidone 25mg  daily. She is concerned Chlorthalidone is contributing to her hypokalemia and this is certainly a possibility. She does not wish to remain on potassium supplementation long-term if other options are possible. Will plan to stop Chlorthalidone and switch to Spironolactone 25 mg daily. She asked for this to be sent to [facility] and is aware she will not receive the medication for up to 1 week. I encouraged her to remain on Chlorthalidone with potassium supplementation until she receives  Spironolactone and then stop the medications at that time. Will plan for a repeat BMET two weeks following the medication change.   3. Hypothyroidism - followed by PCP but she reports her TSH has not been checked since 12/2018. Will recheck with upcoming labs given her recurrent episodes of atrial fibrillation. Remains on Synthroid 75 mcg daily.   4. Hypokalemia - K+ was 3.3 when checked during her recent ED visit. Will recheck BMET in 2 weeks following medication adjustments as outlined above.    Medication Adjustments/Labs and Tests Ordered: Current medicines are reviewed at length with the patient today.  Concerns regarding medicines are outlined above.  Medication changes, Labs and Tests ordered today are listed in the Patient Instructions below. Patient Instructions  Medication Instructions:  STOP POTASSIUM STOP CHLORTHALIDONE  START SPIRONOLACTONE 25 MG DAILY   Labwork: 2 WEEKS AFTER STARTING SPIRONOLACTONE   Testing/Procedures: NONE  Follow-Up: Your physician recommends that you schedule a follow-up appointment in: ALREADY SCHEDULED February 2021    Any Other Special Instructions Will Be Listed Below (If Applicable).   If you need a refill on your cardiac medications before your next appointment, please call your pharmacy.    Signed, Erma Heritage, PA-C  11/24/2019 6:00 PM    [place] [address] [place], [place] [address] Phone: [phone] Fax: [phone]

## 2019-11-24 NOTE — Patient Instructions (Signed)
Medication Instructions:  STOP POTASSIUM STOP CHLORTHALIDONE  START SPIRONOLACTONE 25 MG DAILY   Labwork: 2 WEEKS AFTER STARTING SPIRONOLACTONE   Testing/Procedures: NONE  Follow-Up: Your physician recommends that you schedule a follow-up appointment in: ALREADY SCHEDULED February 2021    Any Other Special Instructions Will Be Listed Below (If Applicable).     If you need a refill on your cardiac medications before your next appointment, please call your pharmacy.

## 2019-11-28 DIAGNOSIS — I10 Essential (primary) hypertension: Secondary | ICD-10-CM | POA: Diagnosis not present

## 2019-11-28 DIAGNOSIS — H5789 Other specified disorders of eye and adnexa: Secondary | ICD-10-CM | POA: Diagnosis not present

## 2019-11-28 DIAGNOSIS — L309 Dermatitis, unspecified: Secondary | ICD-10-CM | POA: Diagnosis not present

## 2019-11-28 DIAGNOSIS — Z299 Encounter for prophylactic measures, unspecified: Secondary | ICD-10-CM | POA: Diagnosis not present

## 2019-12-08 DIAGNOSIS — M25662 Stiffness of left knee, not elsewhere classified: Secondary | ICD-10-CM | POA: Diagnosis not present

## 2019-12-08 DIAGNOSIS — M25562 Pain in left knee: Secondary | ICD-10-CM | POA: Diagnosis not present

## 2019-12-08 DIAGNOSIS — R531 Weakness: Secondary | ICD-10-CM | POA: Diagnosis not present

## 2019-12-08 DIAGNOSIS — M1712 Unilateral primary osteoarthritis, left knee: Secondary | ICD-10-CM | POA: Diagnosis not present

## 2019-12-08 DIAGNOSIS — R269 Unspecified abnormalities of gait and mobility: Secondary | ICD-10-CM | POA: Diagnosis not present

## 2019-12-13 DIAGNOSIS — M25562 Pain in left knee: Secondary | ICD-10-CM | POA: Diagnosis not present

## 2019-12-13 DIAGNOSIS — R269 Unspecified abnormalities of gait and mobility: Secondary | ICD-10-CM | POA: Diagnosis not present

## 2019-12-13 DIAGNOSIS — M1712 Unilateral primary osteoarthritis, left knee: Secondary | ICD-10-CM | POA: Diagnosis not present

## 2019-12-13 DIAGNOSIS — M25662 Stiffness of left knee, not elsewhere classified: Secondary | ICD-10-CM | POA: Diagnosis not present

## 2019-12-13 DIAGNOSIS — R531 Weakness: Secondary | ICD-10-CM | POA: Diagnosis not present

## 2019-12-16 DIAGNOSIS — R269 Unspecified abnormalities of gait and mobility: Secondary | ICD-10-CM | POA: Diagnosis not present

## 2019-12-16 DIAGNOSIS — M1712 Unilateral primary osteoarthritis, left knee: Secondary | ICD-10-CM | POA: Diagnosis not present

## 2019-12-16 DIAGNOSIS — R531 Weakness: Secondary | ICD-10-CM | POA: Diagnosis not present

## 2019-12-16 DIAGNOSIS — M25662 Stiffness of left knee, not elsewhere classified: Secondary | ICD-10-CM | POA: Diagnosis not present

## 2019-12-16 DIAGNOSIS — M25562 Pain in left knee: Secondary | ICD-10-CM | POA: Diagnosis not present

## 2019-12-19 DIAGNOSIS — Z79899 Other long term (current) drug therapy: Secondary | ICD-10-CM | POA: Diagnosis not present

## 2019-12-19 DIAGNOSIS — I480 Paroxysmal atrial fibrillation: Secondary | ICD-10-CM | POA: Diagnosis not present

## 2019-12-20 DIAGNOSIS — M1712 Unilateral primary osteoarthritis, left knee: Secondary | ICD-10-CM | POA: Diagnosis not present

## 2019-12-20 DIAGNOSIS — R531 Weakness: Secondary | ICD-10-CM | POA: Diagnosis not present

## 2019-12-20 DIAGNOSIS — M25562 Pain in left knee: Secondary | ICD-10-CM | POA: Diagnosis not present

## 2019-12-20 DIAGNOSIS — R269 Unspecified abnormalities of gait and mobility: Secondary | ICD-10-CM | POA: Diagnosis not present

## 2019-12-20 DIAGNOSIS — M25662 Stiffness of left knee, not elsewhere classified: Secondary | ICD-10-CM | POA: Diagnosis not present

## 2019-12-26 ENCOUNTER — Other Ambulatory Visit: Payer: Self-pay

## 2019-12-26 ENCOUNTER — Ambulatory Visit: Payer: Medicare Other | Admitting: Cardiology

## 2019-12-26 ENCOUNTER — Encounter: Payer: Self-pay | Admitting: Cardiology

## 2019-12-26 VITALS — BP 148/62 | HR 60 | Ht 62.0 in | Wt 174.0 lb

## 2019-12-26 DIAGNOSIS — I480 Paroxysmal atrial fibrillation: Secondary | ICD-10-CM

## 2019-12-26 DIAGNOSIS — E782 Mixed hyperlipidemia: Secondary | ICD-10-CM | POA: Diagnosis not present

## 2019-12-26 DIAGNOSIS — E039 Hypothyroidism, unspecified: Secondary | ICD-10-CM

## 2019-12-26 DIAGNOSIS — I10 Essential (primary) hypertension: Secondary | ICD-10-CM | POA: Diagnosis not present

## 2019-12-26 MED ORDER — APIXABAN 5 MG PO TABS: 5.0000 mg | ORAL_TABLET | Freq: Two times a day (BID) | ORAL | 0 refills | Status: DC

## 2019-12-26 NOTE — Progress Notes (Signed)
Cardiology Office Note  Date: 12/26/2019   ID: [name], [name] 01-31-1946, MRN [id number]  PCP:  [name], MD  Cardiologist:  Rozann Lesches, MD Electrophysiologist:  None   Chief Complaint  Patient presents with  . Cardiac follow-up     History of Present Illness: [name] is a 74 y.o. female last seen in December 2020 by Ms. Strader PA-C.  She presents for a follow-up visit.  She tells me that she has not experienced any progressive palpitations, no further ER visits.  She had one spell of rapid heartbeat but states that it was brief.  We went over home blood pressure and heart rate checks which look good on present regimen.  I reviewed her recent lab work from late January.  BUN 15, creatinine 0.73, potassium 4.3, and TSH 1.45.  We went over her current medications.  Cardiac regimen includes Eliquis, Cardizem CD, Zocor, and Aldactone.  Past Medical History:  Diagnosis Date  . Anxiety   . Arthritis   . Atrial fibrillation [facility])    Documented December 2019  . Depression   . GERD (gastroesophageal reflux disease)   . Hyperlipidemia   . Hypertension   . Hypothyroidism     Past Surgical History:  Procedure Laterality Date  . APPENDECTOMY    . BUNIONECTOMY Bilateral   . CATARACT EXTRACTION W/PHACO Right 10/09/2017   Procedure: CATARACT EXTRACTION PHACO AND INTRAOCULAR LENS PLACEMENT (IOC);  Surgeon: [name], MD;  Location: [facility];  Service: Ophthalmology;  Laterality: Right;  CDE: 6.65  . CATARACT EXTRACTION W/PHACO Left 12/11/2017   Procedure: CATARACT EXTRACTION PHACO AND INTRAOCULAR LENS PLACEMENT (IOC);  Surgeon: [name], MD;  Location: [facility];  Service: Ophthalmology;  Laterality: Left;  CDE: 5.28  . COLONOSCOPY  06/18/2012   Procedure: COLONOSCOPY;  Surgeon: [name], MD;  Location: [facility];  Service: Endoscopy;  Laterality: N/A;  730  . COLONOSCOPY N/A 07/04/2019   Procedure: COLONOSCOPY;  Surgeon: [name], MD;   Location: [facility];  Service: Endoscopy;  Laterality: N/A;  1220  . ESOPHAGOGASTRODUODENOSCOPY    . Hammer toe repair Left   . HEMORRHOID SURGERY    . THYROIDECTOMY    . TONSILLECTOMY    . TOTAL ABDOMINAL HYSTERECTOMY    . TOTAL SHOULDER ARTHROPLASTY Left 02/06/2017  . TOTAL SHOULDER ARTHROPLASTY Left 02/06/2017   Procedure: TOTAL SHOULDER ARTHROPLASTY;  Surgeon: [name], MD;  Location: [place];  Service: Orthopedics;  Laterality: Left;    Current Outpatient Medications  Medication Sig Dispense Refill  . acetaminophen (TYLENOL) 500 MG tablet Take 500-1,000 mg by mouth every 6 (six) hours as needed (pain.).    Marland Kitchen apixaban (ELIQUIS) 5 MG TABS tablet Take 1 tablet (5 mg total) by mouth 2 (two) times daily. 28 tablet 0  . Calcium Carb-Cholecalciferol (CALCIUM 600 + D PO) Take 2 tablets daily by mouth.    . carboxymethylcellulose (REFRESH PLUS) 0.5 % SOLN Place 1 drop into both eyes 2 (two) times daily.    . Cholecalciferol (VITAMIN D3) 50 MCG (2000 UT) TABS Take 2,000 Units by mouth daily.    . citalopram (CELEXA) 20 MG tablet Take 20 mg by mouth daily.     Marland Kitchen diltiazem (CARDIZEM CD) 240 MG 24 hr capsule Take 240 mg by mouth daily.    Marland Kitchen levothyroxine (SYNTHROID, LEVOTHROID) 75 MCG tablet Take 75 mcg by mouth every morning.     Marland Kitchen MAGNESIUM PO Take 266 mg by  mouth daily.     . Multiple Vitamins-Minerals (MULTIVITAMIN WITH MINERALS) tablet Take 1 tablet daily by mouth.    . polyethylene glycol (MIRALAX / GLYCOLAX) packet Take 17 g by mouth every other day.     . simvastatin (ZOCOR) 10 MG tablet Take 10 mg by mouth daily.    Marland Kitchen spironolactone (ALDACTONE) 25 MG tablet Take 1 tablet (25 mg total) by mouth daily. 90 tablet 3   No current facility-administered medications for this visit.   Allergies:  Latex, Penicillins, Sulfa antibiotics, and Tramadol   Social History: The patient  reports that she has never smoked. She has never used smokeless tobacco. She reports that she does not drink  alcohol or use drugs.   ROS:  Please see the history of present illness. Otherwise, complete review of systems is positive for none.  All other systems are reviewed and negative.   Physical Exam: VS:  BP (!) 148/62   Pulse 60   Ht 5\' 2"  (1.575 m)   Wt 174 lb (78.9 kg)   SpO2 98%   BMI 31.83 kg/m , BMI Body mass index is 31.83 kg/m.  Wt Readings from Last 3 Encounters:  12/26/19 174 lb (78.9 kg)  11/24/19 171 lb (77.6 kg)  11/03/19 163 lb 2.3 oz (74 kg)    General: Patient appears comfortable at rest. HEENT: Conjunctiva and lids normal, wearing a mask Neck: Supple, no elevated JVP or carotid bruits, no thyromegaly. Lungs: Clear to auscultation, nonlabored breathing at rest. Cardiac: Regular rate and rhythm, no S3 or significant systolic murmur. Abdomen: Soft, nontender, bowel sounds present. Extremities: No pitting edema, distal pulses 2+. Skin: Warm and dry. Musculoskeletal: No kyphosis. Neuropsychiatric: Alert and oriented x3, affect grossly appropriate.  ECG:  An ECG dated 11/03/2019 was personally reviewed today and demonstrated:  Sinus rhythm with nonspecific ST-T changes.  Recent Labwork: 11/03/2019: BUN 19; Creatinine, Ser 0.87; Hemoglobin 14.5; Magnesium 2.3; Platelets 278; Potassium 3.3; Sodium 134     Component Value Date/Time   CHOL 117 11/15/2018 0413   TRIG 102 11/15/2018 0413   HDL 39 (L) 11/15/2018 0413   CHOLHDL 3.0 11/15/2018 0413   VLDL 20 11/15/2018 0413   LDLCALC 58 11/15/2018 0413    Other Studies Reviewed Today:  Echocardiogram 11/14/2018: Study Conclusions   - Left ventricle: The cavity size was normal. Wall thickness was  increased in a pattern of moderate LVH. Systolic function was  normal. The estimated ejection fraction was in the range of 55%  to 60%. Wall motion was normal; there were no regional wall  motion abnormalities. Features are consistent with a pseudonormal  left ventricular filling pattern, with concomitant  abnormal  relaxation and increased filling pressure (grade 2 diastolic  dysfunction).   Impressions:   - Normal LV systolic function; moderate diastolic dysfunction;  moderate LVH.   Assessment and Plan:  1.  Paroxysmal atrial fibrillation.  CHA2DS2-VASc score is 3.  She is satisfied with symptom control at this time on current regimen which includes Cardizem CD and Eliquis.  We have discussed the possibility of antiarrhythmic therapy if symptoms escalate.  2.  Essential hypertension.  She is tolerating Aldactone along with her Cardizem CD.  Follow-up lab work reviewed and stable.  3.  Hypothyroidism on Synthroid.  Recent TSH normal range.  4.  Mixed hyperlipidemia, she remains on Zocor.  Last LDL 58.  Medication Adjustments/Labs and Tests Ordered: Current medicines are reviewed at length with the patient today.  Concerns regarding medicines are outlined  above.   Tests Ordered: No orders of the defined types were placed in this encounter.   Medication Changes: Meds ordered this encounter  Medications  . apixaban (ELIQUIS) 5 MG TABS tablet    Sig: Take 1 tablet (5 mg total) by mouth 2 (two) times daily.    Dispense:  28 tablet    Refill:  0    Lot # [id number] Exp JAN 2023    Disposition:  Follow up 6 months in the [place] office.  Signed, Satira Sark, MD, [facility] 12/26/2019 4:09 PM    [facility] [place], [place], [place] [address] Phone: [phone]; Fax: [phone]

## 2019-12-26 NOTE — Patient Instructions (Addendum)
Medication Instructions:  Continue all current medications.  Labwork: none  Testing/Procedures: none  Follow-Up: Your physician wants you to follow up in: 6 months.  You will receive a reminder letter in the mail one-two months in advance.  If you don't receive a letter, please call our office to schedule the follow up appointment   Any Other Special Instructions Will Be Listed Below (If Applicable). Eliquis samples given today.   If you need a refill on your cardiac medications before your next appointment, please call your pharmacy.

## 2020-01-02 DIAGNOSIS — R5383 Other fatigue: Secondary | ICD-10-CM | POA: Diagnosis not present

## 2020-01-02 DIAGNOSIS — Z7189 Other specified counseling: Secondary | ICD-10-CM | POA: Diagnosis not present

## 2020-01-02 DIAGNOSIS — Z1211 Encounter for screening for malignant neoplasm of colon: Secondary | ICD-10-CM | POA: Diagnosis not present

## 2020-01-02 DIAGNOSIS — Z79899 Other long term (current) drug therapy: Secondary | ICD-10-CM | POA: Diagnosis not present

## 2020-01-02 DIAGNOSIS — Z299 Encounter for prophylactic measures, unspecified: Secondary | ICD-10-CM | POA: Diagnosis not present

## 2020-01-02 DIAGNOSIS — E039 Hypothyroidism, unspecified: Secondary | ICD-10-CM | POA: Diagnosis not present

## 2020-01-02 DIAGNOSIS — E7800 Pure hypercholesterolemia, unspecified: Secondary | ICD-10-CM | POA: Diagnosis not present

## 2020-01-02 DIAGNOSIS — Z0000 Encounter for general adult medical examination without abnormal findings: Secondary | ICD-10-CM | POA: Diagnosis not present

## 2020-01-02 DIAGNOSIS — I10 Essential (primary) hypertension: Secondary | ICD-10-CM | POA: Diagnosis not present

## 2020-01-10 ENCOUNTER — Telehealth: Payer: Self-pay | Admitting: Cardiology

## 2020-01-10 ENCOUNTER — Encounter: Payer: Self-pay | Admitting: *Deleted

## 2020-01-10 NOTE — Telephone Encounter (Signed)
I presume that they are talking about polycythemia, I do not have the lab work for review.  If this is the concern, it might be best for the patient and Dr. Woody Seller to review treatment options with a hematologist, and if it is felt that antiplatelet therapy is clearly indicated (and there is no secondary cause for polycythemia that could be corrected), I would not be opposed to this combination.

## 2020-01-10 NOTE — Telephone Encounter (Signed)
Patient called stating that she had a physical last week with Dr.Vyas. She wants her to start taking a baby ASA. She said she had been told not to take ASA with her other medications. She requested to be called on her cell phone. May leave a message

## 2020-01-10 NOTE — Telephone Encounter (Signed)
Patient reports that her PCP wants her to start taking an aspirin 81 mg daily due to having elevated Hemoglobin and Hematocrit that was shown on lab work last week. Patient says she didn't want to start aspirin with eliquis without cardiology approval. Most recent lab work requested from PCP and advised patient that message would be sent to provider for recommendations. Advised that typically aspirin is not taken with eliquis.

## 2020-01-11 NOTE — Telephone Encounter (Signed)
Patient informed and verbalized understanding. Aware that provider will review lab work upon return to office next week.

## 2020-01-12 NOTE — Telephone Encounter (Signed)
I reviewed the lab work.  Hemoglobin was 16.4.  This is mildly elevated, but it is not clear to me that she necessarily needs to start aspirin (unless Dr. Woody Seller has specifically reviewed this with a hematologist who recommends it unequivocally), particularly since she is on Eliquis.  Please communicate with [facility] to make sure that they understand the patient has questions about this.

## 2020-01-16 NOTE — Telephone Encounter (Signed)
Patient informed and verbalized understanding Contacted [facility] several times with no success. Information will be faxed to their office.

## 2020-01-24 DIAGNOSIS — H1013 Acute atopic conjunctivitis, bilateral: Secondary | ICD-10-CM | POA: Diagnosis not present

## 2020-01-24 DIAGNOSIS — H26493 Other secondary cataract, bilateral: Secondary | ICD-10-CM | POA: Diagnosis not present

## 2020-01-25 DIAGNOSIS — Z299 Encounter for prophylactic measures, unspecified: Secondary | ICD-10-CM | POA: Diagnosis not present

## 2020-01-25 DIAGNOSIS — I10 Essential (primary) hypertension: Secondary | ICD-10-CM | POA: Diagnosis not present

## 2020-01-25 DIAGNOSIS — I4891 Unspecified atrial fibrillation: Secondary | ICD-10-CM | POA: Diagnosis not present

## 2020-02-15 DIAGNOSIS — M79674 Pain in right toe(s): Secondary | ICD-10-CM | POA: Diagnosis not present

## 2020-02-15 DIAGNOSIS — S93332A Other subluxation of left foot, initial encounter: Secondary | ICD-10-CM | POA: Diagnosis not present

## 2020-02-15 DIAGNOSIS — S93331A Other subluxation of right foot, initial encounter: Secondary | ICD-10-CM | POA: Diagnosis not present

## 2020-02-15 DIAGNOSIS — M79672 Pain in left foot: Secondary | ICD-10-CM | POA: Diagnosis not present

## 2020-02-15 DIAGNOSIS — M79671 Pain in right foot: Secondary | ICD-10-CM | POA: Diagnosis not present

## 2020-03-13 ENCOUNTER — Other Ambulatory Visit: Payer: Self-pay

## 2020-03-13 ENCOUNTER — Ambulatory Visit (INDEPENDENT_AMBULATORY_CARE_PROVIDER_SITE_OTHER): Payer: Medicare Other | Admitting: Internal Medicine

## 2020-03-13 ENCOUNTER — Encounter (INDEPENDENT_AMBULATORY_CARE_PROVIDER_SITE_OTHER): Payer: Self-pay | Admitting: Internal Medicine

## 2020-03-13 VITALS — BP 138/85 | HR 69 | Temp 96.9°F | Ht 63.0 in | Wt 172.9 lb

## 2020-03-13 DIAGNOSIS — K5900 Constipation, unspecified: Secondary | ICD-10-CM

## 2020-03-13 MED ORDER — PSYLLIUM 63 % PO POWD: 4.0000 g | Freq: Every day | ORAL | Status: DC

## 2020-03-13 NOTE — Progress Notes (Signed)
Presenting complaint;  Follow-up for chronic constipation. History of rectal bleeding.  Database and subjective:  Patient is 74 year old Caucasian female who was last seen in the office in August 2020 for rectal bleeding defecation issues and weight loss.  She has history of atrial fibrillation and is on Eliquis. Patient underwent colonoscopy on 07/04/2019.  She had 7 mm tubular adenoma removed from ascending colon.  She had sigmoid diverticulosis and hepatic flexure as well as external hemorrhoids.  Patient was advised to take fiber supplements daily.  She returned for follow-up visit on 09/12/2019 and was still having sporadic bleeding.  She was advised to use Desitin cream and avoid straining.  Patient states she is doing better.  She is having 2-3 episodes of hematochezia every month.  This occurs when she strains.  It is small amount of blood on the tissue.  She denies abdominal pain heartburn or dysphagia.  She says her appetite has improved.  Since her August 2020 visit she has gained 8 pounds.  Current Medications: Outpatient Encounter Medications as of 03/13/2020  Medication Sig  . acetaminophen (TYLENOL) 500 MG tablet Take 500-1,000 mg by mouth every 6 (six) hours as needed (pain.).  Marland Kitchen apixaban (ELIQUIS) 5 MG TABS tablet Take 1 tablet (5 mg total) by mouth 2 (two) times daily.  . Calcium Carb-Cholecalciferol (CALCIUM 600 + D PO) Take 2 tablets daily by mouth.  . Cholecalciferol (VITAMIN D3) 50 MCG (2000 UT) TABS Take 2,000 Units by mouth daily.  . citalopram (CELEXA) 20 MG tablet Take 20 mg by mouth daily.   Marland Kitchen diltiazem (CARDIZEM CD) 240 MG 24 hr capsule Take 240 mg by mouth daily.  Marland Kitchen levothyroxine (SYNTHROID, LEVOTHROID) 75 MCG tablet Take 75 mcg by mouth every morning.   Marland Kitchen MAGNESIUM PO Take 266 mg by mouth daily.   . Multiple Vitamins-Minerals (MULTIVITAMIN WITH MINERALS) tablet Take 1 tablet daily by mouth.  . Olopatadine HCl (PATADAY OP) Apply to eye. 1 drop to each eye every  morning.  . polyethylene glycol (MIRALAX / GLYCOLAX) packet Take 17 g by mouth every other day.   . simvastatin (ZOCOR) 10 MG tablet Take 10 mg by mouth daily.  Marland Kitchen spironolactone (ALDACTONE) 25 MG tablet Take 1 tablet (25 mg total) by mouth daily.  . carboxymethylcellulose (REFRESH PLUS) 0.5 % SOLN Place 1 drop into both eyes 2 (two) times daily.   No facility-administered encounter medications on file as of 03/13/2020.     Objective: Blood pressure 138/85, pulse 69, temperature (!) 96.9 F (36.1 C), temperature source Temporal, height '5\' 3"'  (1.6 m), weight 172 lb 14.4 oz (78.4 kg). Patient is alert and in no acute distress. Patient is wearing facial mask. Conjunctiva is pink. Sclera is nonicteric Oropharyngeal mucosa is normal. No neck masses or thyromegaly noted. Cardiac exam with regular rhythm normal S1 and S2.  Grade 2/6 systolic murmur noted at aortic area. Lungs are clear to auscultation. Abdomen is symmetrical.  She has lower vertical and horizontal scars.  On palpation abdomen is soft and nontender without organomegaly or masses. No LE edema or clubbing noted.  Labs/studies Results:  CBC Latest Ref Rng & Units 11/03/2019 10/21/2019 05/30/2019  WBC 4.0 - 10.5 K/uL 8.6 8.5 12.5(H)  Hemoglobin 12.0 - 15.0 g/dL 14.5 15.7(H) 15.6(H)  Hematocrit 36.0 - 46.0 % 44.9 46.8(H) 46.6(H)  Platelets 150 - 400 K/uL 278 274 288    CMP Latest Ref Rng & Units 11/03/2019 10/21/2019 05/30/2019  Glucose 70 - 99 mg/dL 96 166(H) 102  BUN 8 - 23 mg/dL '19 12 19  ' Creatinine 0.44 - 1.00 mg/dL 0.87 0.78 0.77  Sodium 135 - 145 mmol/L 134(L) 139 139  Potassium 3.5 - 5.1 mmol/L 3.3(L) 2.9(L) 3.6  Chloride 98 - 111 mmol/L 97(L) 102 101  CO2 22 - 32 mmol/L 25 25 32  Calcium 8.9 - 10.3 mg/dL 9.4 10.1 10.9(H)  Total Protein 6.5 - 8.1 g/dL - - -  Total Bilirubin 0.3 - 1.2 mg/dL - - -  Alkaline Phos 38 - 126 U/L - - -  AST 15 - 41 U/L - - -  ALT 0 - 44 U/L - - -    Hepatic Function Latest Ref Rng & Units  11/13/2018 02/16/2017 08/05/2013  Total Protein 6.5 - 8.1 g/dL 6.7 6.0(L) 6.6  Albumin 3.5 - 5.0 g/dL 3.9 3.0(L) 4.3  AST 15 - 41 U/L 36 26 15  ALT 0 - 44 U/L '28 23 13  ' Alk Phosphatase 38 - 126 U/L 69 126 76  Total Bilirubin 0.3 - 1.2 mg/dL 0.8 0.7 1.2  Bilirubin, Direct 0.0 - 0.3 mg/dL - - 0.2    Patient reports her H&H was 16.4 and 47.9 on 01/03/2020.  Assessment:  #1.  Chronic hematochezia secondary to hemorrhoids.  She is maintaining her hemoglobin.  She had colonoscopy in August last year.  She has internal hemorrhoids.  She bleeds only when she strains. No further intervention unless bleeding accelerates.  #2.  Chronic constipation.  Since she is not having desired results with high-fiber diet and Benefiber she might benefit from polyethylene glycol at low-dose.   Plan:  Metamucil 4 g by mouth daily at bedtime. Polyethylene glycol.  Start at a dose of 8.5 g daily or every other day. Can adjust the dose to desired results. Follow-up in 1 year.

## 2020-03-13 NOTE — Patient Instructions (Signed)
Take Miralax on schedule either daly or every other day. Metamucil 4 g by mouth daily at bedtime.

## 2020-03-14 DIAGNOSIS — M1711 Unilateral primary osteoarthritis, right knee: Secondary | ICD-10-CM | POA: Diagnosis not present

## 2020-03-14 DIAGNOSIS — M25561 Pain in right knee: Secondary | ICD-10-CM | POA: Diagnosis not present

## 2020-03-14 DIAGNOSIS — M7061 Trochanteric bursitis, right hip: Secondary | ICD-10-CM | POA: Diagnosis not present

## 2020-03-14 DIAGNOSIS — M545 Low back pain: Secondary | ICD-10-CM | POA: Diagnosis not present

## 2020-03-15 ENCOUNTER — Telehealth (INDEPENDENT_AMBULATORY_CARE_PROVIDER_SITE_OTHER): Payer: Self-pay | Admitting: Internal Medicine

## 2020-03-15 NOTE — Telephone Encounter (Signed)
Patient called stated she is having an MRI - stated everything should be in the notes - also stated she is going out of town for 2 weeks and was hoping to have this done before 5/4 - please advise [phone]

## 2020-03-19 NOTE — Telephone Encounter (Signed)
Patient has talked with Lacretia Nicks , Coordinator. Awaiting to talk with Dr.Rehman.

## 2020-03-23 ENCOUNTER — Other Ambulatory Visit (INDEPENDENT_AMBULATORY_CARE_PROVIDER_SITE_OTHER): Payer: Self-pay | Admitting: *Deleted

## 2020-03-23 DIAGNOSIS — T184XXA Foreign body in colon, initial encounter: Secondary | ICD-10-CM

## 2020-03-27 NOTE — Telephone Encounter (Signed)
Patient aware she need plain abd xray and that she can go anytime to have done

## 2020-04-09 ENCOUNTER — Ambulatory Visit (HOSPITAL_COMMUNITY)
Admission: RE | Admit: 2020-04-09 | Discharge: 2020-04-09 | Disposition: A | Discharge status: Home / Self Care | Payer: Medicare Other | Source: Ambulatory Visit | Attending: Internal Medicine | Admitting: Internal Medicine

## 2020-04-09 ENCOUNTER — Other Ambulatory Visit: Payer: Self-pay

## 2020-04-09 DIAGNOSIS — T184XXA Foreign body in colon, initial encounter: Secondary | ICD-10-CM

## 2020-04-09 DIAGNOSIS — R935 Abnormal findings on diagnostic imaging of other abdominal regions, including retroperitoneum: Secondary | ICD-10-CM | POA: Diagnosis not present

## 2020-05-04 DIAGNOSIS — I4891 Unspecified atrial fibrillation: Secondary | ICD-10-CM | POA: Diagnosis not present

## 2020-05-04 DIAGNOSIS — I10 Essential (primary) hypertension: Secondary | ICD-10-CM | POA: Diagnosis not present

## 2020-05-04 DIAGNOSIS — E876 Hypokalemia: Secondary | ICD-10-CM | POA: Diagnosis not present

## 2020-05-04 DIAGNOSIS — Z299 Encounter for prophylactic measures, unspecified: Secondary | ICD-10-CM | POA: Diagnosis not present

## 2020-05-04 DIAGNOSIS — R300 Dysuria: Secondary | ICD-10-CM | POA: Diagnosis not present

## 2020-05-04 DIAGNOSIS — E039 Hypothyroidism, unspecified: Secondary | ICD-10-CM | POA: Diagnosis not present

## 2020-05-12 DIAGNOSIS — M1711 Unilateral primary osteoarthritis, right knee: Secondary | ICD-10-CM | POA: Diagnosis not present

## 2020-05-22 DIAGNOSIS — M1711 Unilateral primary osteoarthritis, right knee: Secondary | ICD-10-CM | POA: Diagnosis not present

## 2020-05-22 DIAGNOSIS — M25561 Pain in right knee: Secondary | ICD-10-CM | POA: Diagnosis not present

## 2020-05-30 ENCOUNTER — Telehealth: Payer: Self-pay | Admitting: *Deleted

## 2020-05-30 NOTE — Telephone Encounter (Signed)
[name] is very pleasant female with PMH of PAF. Well controlled on Eliquis and Diltiazem. No prior h/o CAD. Unfortunately, he is unable to do 4 METS of activity due to chronic knee issue, but she denies any recent chest pain or shortness of breath. She is quite independent at home and was doing well when last seen by [name].   [name], given lack of any anginal symptom and no prior h/o CAD, would be ok with clearance despite patient is unable to accomplish 4 METS of activity or do you recommend additional workup. Last echo was in 2019 which was normal. Surgery is scheduled for 06/29/2020.   Clinical pharmacist to review Eliquis

## 2020-05-30 NOTE — Telephone Encounter (Signed)
Patient with diagnosis of afib on Eliquis for anticoagulation.    Procedure: Right total knee orthoplasty Date of procedure: 06/29/20  CHADS2-VASc score of  3 (HTN, AGE, female)  CrCl 52 ml/min  Per office protocol, patient can hold Eliquis for 3 days prior to procedure.

## 2020-05-30 NOTE — Telephone Encounter (Signed)
Callback pool, please inform the patient she is cleared and will need to hold Eliquis for 3 days prior to the surgery and restart as soon as possible after the procedure at the surgeon's discretion.

## 2020-05-30 NOTE — Telephone Encounter (Signed)
   [facility] Pre-operative Risk Assessment    HEARTCARE STAFF: - Please ensure there is not already an duplicate clearance open for this procedure. - Under Visit Info/Reason for Call, type in Other and utilize the format Clearance MM/DD/YY or Clearance TBD. Do not use dashes or single digits. - If request is for dental extraction, please clarify the # of teeth to be extracted.  Request for surgical clearance:  1. What type of surgery is being performed? Right total knee orthoplasty  2. When is this surgery scheduled? 06/29/2020  3. What type of clearance is required (medical clearance vs. Pharmacy clearance to hold med vs. Both)? Both  4. Are there any medications that need to be held prior to surgery and how long? eliquis  5. Practice name and name of physician performing surgery? Dr. Esmond Plants  6. What is the office phone number? [phone]   7.   What is the office fax number? [phone]  8.   Anesthesia type (None, local, MAC, general) ? spinal   Marlou Sa 05/30/2020, 10:43 AM  _________________________________________________________________   (provider comments below)

## 2020-05-30 NOTE — Telephone Encounter (Signed)
Since she is not reporting any exertional symptoms with tolerated activity and has no history of ischemic heart disease, also a low risk RCRI calculation of 0.4% chance of major adverse cardiac risk with anticipated surgery, I do not think that ischemic testing is indicated at this time.

## 2020-05-31 NOTE — Telephone Encounter (Signed)
Pt aware of recommendation to hold Eliquis 3 days prior to procedure.

## 2020-06-05 DIAGNOSIS — M1710 Unilateral primary osteoarthritis, unspecified knee: Secondary | ICD-10-CM | POA: Diagnosis not present

## 2020-06-05 DIAGNOSIS — Z299 Encounter for prophylactic measures, unspecified: Secondary | ICD-10-CM | POA: Diagnosis not present

## 2020-06-05 DIAGNOSIS — D6869 Other thrombophilia: Secondary | ICD-10-CM | POA: Diagnosis not present

## 2020-06-05 DIAGNOSIS — I4891 Unspecified atrial fibrillation: Secondary | ICD-10-CM | POA: Diagnosis not present

## 2020-06-05 DIAGNOSIS — I10 Essential (primary) hypertension: Secondary | ICD-10-CM | POA: Diagnosis not present

## 2020-06-11 ENCOUNTER — Ambulatory Visit: Payer: Medicare Other | Admitting: Cardiology

## 2020-06-11 ENCOUNTER — Encounter: Payer: Self-pay | Admitting: Cardiology

## 2020-06-11 VITALS — BP 140/80 | HR 85 | Ht 63.0 in | Wt 177.0 lb

## 2020-06-11 DIAGNOSIS — I480 Paroxysmal atrial fibrillation: Secondary | ICD-10-CM

## 2020-06-11 DIAGNOSIS — Z01810 Encounter for preprocedural cardiovascular examination: Secondary | ICD-10-CM

## 2020-06-11 DIAGNOSIS — I10 Essential (primary) hypertension: Secondary | ICD-10-CM

## 2020-06-11 NOTE — Patient Instructions (Signed)
Medication Instructions:  Continue all current medications.   Labwork: none  Testing/Procedures: none  Follow-Up: 6 months   Any Other Special Instructions Will Be Listed Below (If Applicable).   If you need a refill on your cardiac medications before your next appointment, please call your pharmacy.  

## 2020-06-11 NOTE — Progress Notes (Signed)
Cardiology Office Note  Date: 06/11/2020   ID: Keniyah, Gelinas 1946-03-14, MRN [id number]  PCP:  [name], MD  Cardiologist:  Rozann Lesches, MD Electrophysiologist:  None   Chief Complaint  Patient presents with  . Cardiac follow-up    History of Present Illness: [name] is a 74 y.o. female last seen in February.  She presents for a routine visit.  Other than experiencing stable, intermittent palpitations, she does not describe any exertional chest pain or unusual breathlessness.  She is scheduled for right total knee arthroplasty by Dr. Veverly Fells in early August. RCRI cardiac risk calculator indicates low risk, 0.4% chance of major adverse cardiac event.  She will be holding Eliquis 3 days prior to the operation.  I reviewed her medications which are outlined below.  She does not report any spontaneous bleeding problems on Eliquis.  Past Medical History:  Diagnosis Date  . Anxiety   . Arthritis   . Atrial fibrillation [facility])    Documented December 2019  . Depression   . GERD (gastroesophageal reflux disease)   . Hyperlipidemia   . Hypertension   . Hypothyroidism     Past Surgical History:  Procedure Laterality Date  . APPENDECTOMY    . BUNIONECTOMY Bilateral   . CATARACT EXTRACTION W/PHACO Right 10/09/2017   Procedure: CATARACT EXTRACTION PHACO AND INTRAOCULAR LENS PLACEMENT (IOC);  Surgeon: [name], MD;  [facility];  Service: Ophthalmology;  Laterality: Right;  CDE: 6.65  . CATARACT EXTRACTION W/PHACO Left 12/11/2017   Procedure: CATARACT EXTRACTION PHACO AND INTRAOCULAR LENS PLACEMENT (IOC);  Surgeon: [name], MD;  Location: [facility];  Service: Ophthalmology;  Laterality: Left;  CDE: 5.28  . COLONOSCOPY  06/18/2012   Procedure: COLONOSCOPY;  Surgeon: [name], MD;  [facility];  Service: Endoscopy;  Laterality: N/A;  730  . COLONOSCOPY N/A 07/04/2019   Procedure: COLONOSCOPY;  Surgeon: [name], MD;   [facility];  Service: Endoscopy;  Laterality: N/A;  1220  . ESOPHAGOGASTRODUODENOSCOPY    . Hammer toe repair Left   . HEMORRHOID SURGERY    . THYROIDECTOMY    . TONSILLECTOMY    . TOTAL ABDOMINAL HYSTERECTOMY    . TOTAL SHOULDER ARTHROPLASTY Left 02/06/2017  . TOTAL SHOULDER ARTHROPLASTY Left 02/06/2017   Procedure: TOTAL SHOULDER ARTHROPLASTY;  Surgeon: [name], MD;  Location: [place];  Service: Orthopedics;  Laterality: Left;    Current Outpatient Medications  Medication Sig Dispense Refill  . acetaminophen (TYLENOL) 500 MG tablet Take 1,000 mg by mouth every 6 (six) hours as needed (pain.).     Marland Kitchen ALPRAZolam (XANAX) 0.25 MG tablet Take 0.25 mg by mouth 2 (two) times daily as needed for anxiety.    Marland Kitchen apixaban (ELIQUIS) 5 MG TABS tablet Take 1 tablet (5 mg total) by mouth 2 (two) times daily. 28 tablet 0  . ascorbic acid (VITAMIN C) 500 MG tablet Take 500 mg by mouth daily.    . Calcium-Magnesium-Vitamin D (CALCIUM 1200+D3 PO) Take 1 tablet by mouth daily.    . citalopram (CELEXA) 20 MG tablet Take 20 mg by mouth daily.     Marland Kitchen diltiazem (CARDIZEM CD) 240 MG 24 hr capsule Take 240 mg by mouth daily.    Marland Kitchen levothyroxine (SYNTHROID, LEVOTHROID) 75 MCG tablet Take 75 mcg by mouth daily before breakfast.     . Magnesium 250 MG TABS Take 500 mg by mouth daily.    . Multiple Vitamins-Minerals (  MULTIVITAMIN WITH MINERALS) tablet Take 1 tablet by mouth daily. One A Day for Women    . Olopatadine HCl (PATADAY OP) Apply to eye. 1 drop to each eye every morning.    . polyethylene glycol (MIRALAX / GLYCOLAX) packet Take 17 g by mouth daily as needed for mild constipation.     . Psyllium 63 % POWD Take 4 g by mouth at bedtime.    . simvastatin (ZOCOR) 10 MG tablet Take 10 mg by mouth daily.    Marland Kitchen spironolactone (ALDACTONE) 25 MG tablet Take 1 tablet (25 mg total) by mouth daily. 90 tablet 3  . Vitamin D3 (VITAMIN D) 25 MCG tablet Take 1,000 Units by mouth daily.     No current  facility-administered medications for this visit.   Allergies:  Latex, Penicillins, Sulfa antibiotics, and Tramadol   ROS:   Arthritic pain.  Physical Exam: VS:  BP 140/80   Pulse 85   Ht 5\' 3"  (1.6 m)   Wt 177 lb (80.3 kg)   SpO2 96%   BMI 31.35 kg/m , BMI Body mass index is 31.35 kg/m.  Wt Readings from Last 3 Encounters:  06/11/20 177 lb (80.3 kg)  03/13/20 172 lb 14.4 oz (78.4 kg)  12/26/19 174 lb (78.9 kg)    General: Patient appears comfortable at rest. HEENT: Conjunctiva and lids normal, wearing a mask. Neck: Supple, no elevated JVP or carotid bruits, no thyromegaly. Lungs: Clear to auscultation, nonlabored breathing at rest. Cardiac: Regular rate and rhythm, no S3 or significant systolic murmur, no pericardial rub. Extremities: No pitting edema, distal pulses 2+.  ECG:  An ECG dated 11/03/2019 was personally reviewed today and demonstrated:  Sinus rhythm with nonspecific ST-T changes.  Recent Labwork: 11/03/2019: BUN 19; Creatinine, Ser 0.87; Hemoglobin 14.5; Magnesium 2.3; Platelets 278; Potassium 3.3; Sodium 134     Component Value Date/Time   CHOL 117 11/15/2018 0413   TRIG 102 11/15/2018 0413   HDL 39 (L) 11/15/2018 0413   CHOLHDL 3.0 11/15/2018 0413   VLDL 20 11/15/2018 0413   LDLCALC 58 11/15/2018 0413    Other Studies Reviewed Today:  Echocardiogram 11/14/2018: Study Conclusions   - Left ventricle: The cavity size was normal. Wall thickness was  increased in a pattern of moderate LVH. Systolic function was  normal. The estimated ejection fraction was in the range of 55%  to 60%. Wall motion was normal; there were no regional wall  motion abnormalities. Features are consistent with a pseudonormal  left ventricular filling pattern, with concomitant abnormal  relaxation and increased filling pressure (grade 2 diastolic  dysfunction).   Impressions:   - Normal LV systolic function; moderate diastolic dysfunction;  moderate LVH.    Assessment and Plan:  1.  Paroxysmal atrial fibrillation with CHA2DS2-VASc score of 3.  She is on Eliquis for stroke prophylaxis as well as Cardizem CD.  She reports intermittent palpitations although this has been stable, no change in current management strategy.  2.  Preoperative cardiac evaluation prior to right total knee arthroplasty in early June.  RCRI cardiac risk calculator indicates low risk, 0.4% chance of major adverse cardiac event.  She will be holding Eliquis 3 days prior to the operation.  3.  Essential hypertension, systolic is 093 today.  She continues on Cardizem CD as well as Aldactone.  Keep follow-up with PCP.  Medication Adjustments/Labs and Tests Ordered: Current medicines are reviewed at length with the patient today.  Concerns regarding medicines are outlined above.   Tests  Ordered: No orders of the defined types were placed in this encounter.   Medication Changes: No orders of the defined types were placed in this encounter.   Disposition:  Follow up 6 months in the [place] office.  Signed, Satira Sark, MD, [facility] 06/11/2020 10:32 AM    [place] [facility] [place], [place], [place] [address] Phone: [phone]; Fax: [phone]

## 2020-06-12 NOTE — H&P (Signed)
Patient's anticipated LOS is less than 2 midnights, meeting these requirements: - Younger than 64 - Lives within 1 hour of care - Has a competent adult at home to recover with post-op recover - NO history of  - Chronic pain requiring opiods  - Diabetes  - Coronary Artery Disease  - Heart failure  - Heart attack  - Stroke  - DVT/VTE  - Cardiac arrhythmia  - Respiratory Failure/COPD  - Renal failure  - Anemia  - Advanced Liver disease       [name] is an 74 y.o. female.    Chief Complaint: right knee pain  HPI: Pt is a 74 y.o. female complaining of right knee pain for multiple years. Pain had continually increased since the beginning. X-rays in the clinic show end-stage arthritic changes of the right knee. Pt has tried various conservative treatments which have failed to alleviate their symptoms, including injections and therapy. Various options are discussed with the patient. Risks, benefits and expectations were discussed with the patient. Patient understand the risks, benefits and expectations and wishes to proceed with surgery.   PCP:  [name], MD  D/C Plans: Home  PMH: Past Medical History:  Diagnosis Date  . Anxiety   . Arthritis   . Atrial fibrillation [facility])    Documented December 2019  . Depression   . GERD (gastroesophageal reflux disease)   . Hyperlipidemia   . Hypertension   . Hypothyroidism     PSH: Past Surgical History:  Procedure Laterality Date  . APPENDECTOMY    . BUNIONECTOMY Bilateral   . CATARACT EXTRACTION W/PHACO Right 10/09/2017   Procedure: CATARACT EXTRACTION PHACO AND INTRAOCULAR LENS PLACEMENT (IOC);  Surgeon: [name], MD;  [facility];  Service: Ophthalmology;  Laterality: Right;  CDE: 6.65  . CATARACT EXTRACTION W/PHACO Left 12/11/2017   Procedure: CATARACT EXTRACTION PHACO AND INTRAOCULAR LENS PLACEMENT (IOC);  Surgeon: [name], MD;  [facility];  Service: Ophthalmology;  Laterality: Left;  CDE:  5.28  . COLONOSCOPY  06/18/2012   Procedure: COLONOSCOPY;  Surgeon: [name], MD;  [facility];  Service: Endoscopy;  Laterality: N/A;  730  . COLONOSCOPY N/A 07/04/2019   Procedure: COLONOSCOPY;  Surgeon: [name], MD;  [facility];  Service: Endoscopy;  Laterality: N/A;  1220  . ESOPHAGOGASTRODUODENOSCOPY    . Hammer toe repair Left   . HEMORRHOID SURGERY    . THYROIDECTOMY    . TONSILLECTOMY    . TOTAL ABDOMINAL HYSTERECTOMY    . TOTAL SHOULDER ARTHROPLASTY Left 02/06/2017  . TOTAL SHOULDER ARTHROPLASTY Left 02/06/2017   Procedure: TOTAL SHOULDER ARTHROPLASTY;  Surgeon: [name], MD;  Location: [place];  Service: Orthopedics;  Laterality: Left;    Social History:  reports that she has never smoked. She has never used smokeless tobacco. She reports that she does not drink alcohol and does not use drugs.  Allergies:  Allergies  Allergen Reactions  . Latex Rash  . Penicillins Rash and Other (See Comments)    Has patient had a PCN reaction causing immediate rash, facial/tongue/throat swelling, SOB or lightheadedness with hypotension: #  #  #  YES  #  #  #  Has patient had a PCN reaction causing severe rash involving mucus membranes or skin necrosis: No Has patient had a PCN reaction that required hospitalization Yes Has patient had a PCN reaction occurring within the last 10 years: No If all of the above answers are "  NO", then may proceed with Cephalosporin use.   . Sulfa Antibiotics Rash  . Tramadol Itching    Medications: No current facility-administered medications for this encounter.   Current Outpatient Medications  Medication Sig Dispense Refill  . acetaminophen (TYLENOL) 500 MG tablet Take 1,000 mg by mouth every 6 (six) hours as needed (pain.).     Marland Kitchen ALPRAZolam (XANAX) 0.25 MG tablet Take 0.25 mg by mouth 2 (two) times daily as needed for anxiety.    Marland Kitchen apixaban (ELIQUIS) 5 MG TABS tablet Take 1 tablet (5 mg total) by mouth 2 (two)  times daily. 28 tablet 0  . ascorbic acid (VITAMIN C) 500 MG tablet Take 500 mg by mouth daily.    . Calcium-Magnesium-Vitamin D (CALCIUM 1200+D3 PO) Take 1 tablet by mouth daily.    . citalopram (CELEXA) 20 MG tablet Take 20 mg by mouth daily.     Marland Kitchen diltiazem (CARDIZEM CD) 240 MG 24 hr capsule Take 240 mg by mouth daily.    Marland Kitchen levothyroxine (SYNTHROID, LEVOTHROID) 75 MCG tablet Take 75 mcg by mouth daily before breakfast.     . Magnesium 250 MG TABS Take 500 mg by mouth daily.    . Multiple Vitamins-Minerals (MULTIVITAMIN WITH MINERALS) tablet Take 1 tablet by mouth daily. One A Day for Women    . Olopatadine HCl (PATADAY OP) Apply to eye. 1 drop to each eye every morning.    . polyethylene glycol (MIRALAX / GLYCOLAX) packet Take 17 g by mouth daily as needed for mild constipation.     . simvastatin (ZOCOR) 10 MG tablet Take 10 mg by mouth daily.    . Vitamin D3 (VITAMIN D) 25 MCG tablet Take 1,000 Units by mouth daily.    . Psyllium 63 % POWD Take 4 g by mouth at bedtime.    Marland Kitchen spironolactone (ALDACTONE) 25 MG tablet Take 1 tablet (25 mg total) by mouth daily. 90 tablet 3    No results found for this or any previous visit (from the past 48 hour(s)). No results found.  ROS: Pain with rom of the right lower extremity  Physical Exam: Alert and oriented 74 y.o. female in no acute distress Cranial nerves 2-12 intact Cervical spine: full rom with no tenderness, nv intact distally Chest: active breath sounds bilaterally, no wheeze rhonchi or rales Heart: regular rate and rhythm, no murmur Abd: non tender non distended with active bowel sounds Hip is stable with rom  Right knee painful rom  Medial and lateral joint tenderness nv intact distally No rashes or edema  Assessment/Plan Assessment: right knee end stage osteoarthritis  Plan:  Patient will undergo a right total knee by Dr. Veverly Fells at [facility] Risks benefits and expectations were discussed with the patient. Patient understand  risks, benefits and expectations and wishes to proceed. Preoperative templating of the joint replacement has been completed, documented, and submitted to the Operating Room personnel in order to optimize intra-operative equipment management.   Merla Riches PA-C, MPAS [facility] is now [employer] [address] [place], [place], [place] [address] Phone: [phone] [email or web address] Facebook  [employer]

## 2020-06-13 ENCOUNTER — Encounter (HOSPITAL_COMMUNITY): Payer: Self-pay

## 2020-06-13 NOTE — Progress Notes (Addendum)
PCP - [name], [name] MD Cardiologist - Rozann Lesches lov clearance 06-11-20 epic  PPM/ICD -  Device Orders -  Rep Notified -   Chest x-ray - 10-21-19 epic EKG - 11-04-19 epic Stress Test -  ECHO - 11-14-18 epic Cardiac Cath -   Sleep Study -  CPAP -   Fasting Blood Sugar -  Checks Blood Sugar _____ times a day  Blood Thinner Instructions:Elequis hold 3 days prior to surgery Aspirin Instructions:  ERAS Protcol - PRE-SURGERY Ensure   COVID TEST- 06-26-20  Activity-Walks to mailbox up a hill and sometimes gets sob,   Does all own housework without sob  Anesthesia review: A fib , HTN  Patient denies shortness of breath, fever, cough and chest pain at PAT appointment   none   All instructions explained to the patient, with a verbal understanding of the material. Patient agrees to go over the instructions while at home for a better understanding. Patient also instructed to self quarantine after being tested for COVID-19. The opportunity to ask questions was provided.

## 2020-06-13 NOTE — Patient Instructions (Addendum)
YOU NEED TO HAVE A COVID 19 TEST ON__8-3-21_____ @_______ , THIS TEST MUST BE DONE BEFORE SURGERY ONCE YOUR COVID TEST IS COMPLETED, PLEASE BEGIN THE QUARANTINE INSTRUCTIONS AS OUTLINED IN YOUR HANDOUT. Covid testing site is at    [place], [place] [address]               DUE TO COVID-19 ONLY ONE VISITOR IS ALLOWED TO COME WITH YOU AND STAY IN THE WAITING ROOM ONLY DURING PRE OP AND PROCEDURE DAY OF SURGERY. Two  VISITOR MAY VISIT WITH YOU AFTER SURGERY IN YOUR PRIVATE ROOM DURING VISITING HOURS ONLY! 10a-8p                   [name]  06/13/2020   Your procedure is scheduled on: 06-29-20   Report to [facility] Main  Entrance   Report to admitting at       0730 AM     Call this number if you have problems the morning of surgery [phone]    Remember: NO SOLID FOOD AFTER MIDNIGHT THE NIGHT PRIOR TO SURGERY. NOTHING BY MOUTH EXCEPT CLEAR LIQUIDS UNTIL   0700 am  . PLEASE FINISH ENSURE DRINK PER SURGEON ORDER  WHICH NEEDS TO BE COMPLETED AT         0700 am then nothing by mouth .     CLEAR LIQUID DIET   Foods Allowed                                                                                      Foods Excluded  Coffee and tea, regular and decaf NO CREAMER                            liquids that you cannot  Plain Jell-O any favor except red or purple                                           see through such as: Fruit ices (not with fruit pulp)                                                     milk, soups, orange juice  Iced Popsicles                                                    All solid food Carbonated beverages, regular and diet                                    Cranberry, grape and apple juices Sports drinks like Gatorade Lightly seasoned clear broth or  consume(fat free) Sugar, honey syrup  _____________________________________________________________________   BRUSH YOUR TEETH MORNING OF SURGERY AND RINSE YOUR MOUTH OUT, NO  CHEWING GUM CANDY OR MINTS.     Take these medicines the morning of surgery with A SIP OF WATER: Eye drops, levothyroxine,diltiazem,xanax if needed                                 You may not have any metal on your body including hair pins and              piercings  Do not wear jewelry, make-up, lotions, powders or perfumes, deodorant             Do not wear nail polish on your fingernails.  Do not shave  48 hours prior to surgery.     Do not bring valuables to the hospital. [place].  Contacts, dentures or bridgework may not be worn into surgery.      Patients discharged the day of surgery will not be allowed to drive home. IF YOU ARE HAVING SURGERY AND GOING HOME THE SAME DAY, YOU MUST HAVE AN ADULT TO DRIVE YOU HOME AND BE WITH YOU FOR 24 HOURS. YOU MAY GO HOME BY TAXI OR UBER OR ORTHERWISE, BUT AN ADULT MUST ACCOMPANY YOU HOME AND STAY WITH YOU FOR 24 HOURS.  Name and phone number of your driver:  Special Instructions: N/A              Please read over the following fact sheets you were given: _____________________________________________________________________             [facility] - Preparing for Surgery Before surgery, you can play an important role.  Because skin is not sterile, your skin needs to be as free of germs as possible.  You can reduce the number of germs on your skin by washing with CHG (chlorahexidine gluconate) soap before surgery.  CHG is an antiseptic cleaner which kills germs and bonds with the skin to continue killing germs even after washing. Please DO NOT use if you have an allergy to CHG or antibacterial soaps.  If your skin becomes reddened/irritated stop using the CHG and inform your nurse when you arrive at Short Stay. Do not shave (including legs and underarms) for at least 48 hours prior to the first CHG shower.  You may shave your face/neck. Please follow these instructions carefully:  1.  Shower  with CHG Soap the night before surgery and the  morning of Surgery.  2.  If you choose to wash your hair, wash your hair first as usual with your  normal  shampoo.  3.  After you shampoo, rinse your hair and body thoroughly to remove the  shampoo.                           4.  Use CHG as you would any other liquid soap.  You can apply chg directly  to the skin and wash                       Gently with a scrungie or clean washcloth.  5.  Apply the CHG Soap to your body ONLY FROM THE NECK DOWN.   Do not use on face/  open                           Wound or open sores. Avoid contact with eyes, ears mouth and genitals (private parts).                       Wash face,  Genitals (private parts) with your normal soap.             6.  Wash thoroughly, paying special attention to the area where your surgery  will be performed.  7.  Thoroughly rinse your body with warm water from the neck down.  8.  DO NOT shower/wash with your normal soap after using and rinsing off  the CHG Soap.                9.  Pat yourself dry with a clean towel.            10.  Wear clean pajamas.            11.  Place clean sheets on your bed the night of your first shower and do not  sleep with pets. Day of Surgery : Do not apply any lotions/deodorants the morning of surgery.  Please wear clean clothes to the hospital/surgery center.  FAILURE TO FOLLOW THESE INSTRUCTIONS MAY RESULT IN THE CANCELLATION OF YOUR SURGERY PATIENT SIGNATURE_________________________________  NURSE SIGNATURE__________________________________  ________________________________________________________________________   [name]  An incentive spirometer is a tool that can help keep your lungs clear and active. This tool measures how well you are filling your lungs with each breath. Taking long deep breaths may help reverse or decrease the chance of developing breathing (pulmonary) problems (especially infection) following:  A long period  of time when you are unable to move or be active. BEFORE THE PROCEDURE   If the spirometer includes an indicator to show your best effort, your nurse or respiratory therapist will set it to a desired goal.  If possible, sit up straight or lean slightly forward. Try not to slouch.  Hold the incentive spirometer in an upright position. INSTRUCTIONS FOR USE  1. Sit on the edge of your bed if possible, or sit up as far as you can in bed or on a chair. 2. Hold the incentive spirometer in an upright position. 3. Breathe out normally. 4. Place the mouthpiece in your mouth and seal your lips tightly around it. 5. Breathe in slowly and as deeply as possible, raising the piston or the ball toward the top of the column. 6. Hold your breath for 3-5 seconds or for as long as possible. Allow the piston or ball to fall to the bottom of the column. 7. Remove the mouthpiece from your mouth and breathe out normally. 8. Rest for a few seconds and repeat Steps 1 through 7 at least 10 times every 1-2 hours when you are awake. Take your time and take a few normal breaths between deep breaths. 9. The spirometer may include an indicator to show your best effort. Use the indicator as a goal to work toward during each repetition. 10. After each set of 10 deep breaths, practice coughing to be sure your lungs are clear. If you have an incision (the cut made at the time of surgery), support your incision when coughing by placing a pillow or rolled up towels firmly against it. Once you are able to get out of bed, walk around indoors and  cough well. You may stop using the incentive spirometer when instructed by your caregiver.  RISKS AND COMPLICATIONS  Take your time so you do not get dizzy or light-headed.  If you are in pain, you may need to take or ask for pain medication before doing incentive spirometry. It is harder to take a deep breath if you are having pain. AFTER USE  Rest and breathe slowly and easily.  It  can be helpful to keep track of a log of your progress. Your caregiver can provide you with a simple table to help with this. If you are using the spirometer at home, follow these instructions: [place] IF:   You are having difficultly using the spirometer.  You have trouble using the spirometer as often as instructed.  Your pain medication is not giving enough relief while using the spirometer.  You develop fever of 100.5 F (38.1 C) or higher. SEEK IMMEDIATE MEDICAL CARE IF:   You cough up bloody sputum that had not been present before.  You develop fever of 102 F (38.9 C) or greater.  You develop worsening pain at or near the incision site. MAKE SURE YOU:   Understand these instructions.  Will watch your condition.  Will get help right away if you are not doing well or get worse. Document Released: 03/23/2007 Document Revised: 02/02/2012 Document Reviewed: 05/24/2007 [facility] Patient Information 2014 [place], [place].   ________________________________________________________________________

## 2020-06-15 DIAGNOSIS — N390 Urinary tract infection, site not specified: Secondary | ICD-10-CM | POA: Diagnosis not present

## 2020-06-15 DIAGNOSIS — Z299 Encounter for prophylactic measures, unspecified: Secondary | ICD-10-CM | POA: Diagnosis not present

## 2020-06-15 DIAGNOSIS — I4891 Unspecified atrial fibrillation: Secondary | ICD-10-CM | POA: Diagnosis not present

## 2020-06-15 DIAGNOSIS — R300 Dysuria: Secondary | ICD-10-CM | POA: Diagnosis not present

## 2020-06-15 DIAGNOSIS — D6869 Other thrombophilia: Secondary | ICD-10-CM | POA: Diagnosis not present

## 2020-06-15 DIAGNOSIS — I10 Essential (primary) hypertension: Secondary | ICD-10-CM | POA: Diagnosis not present

## 2020-06-20 ENCOUNTER — Other Ambulatory Visit: Payer: Self-pay

## 2020-06-20 ENCOUNTER — Encounter (HOSPITAL_COMMUNITY)
Admission: RE | Admit: 2020-06-20 | Discharge: 2020-06-20 | Disposition: A | Discharge status: Home / Self Care | Payer: Medicare Other | Source: Ambulatory Visit | Attending: Orthopedic Surgery | Admitting: Orthopedic Surgery

## 2020-06-20 ENCOUNTER — Encounter (HOSPITAL_COMMUNITY): Payer: Self-pay

## 2020-06-20 DIAGNOSIS — Z01812 Encounter for preprocedural laboratory examination: Secondary | ICD-10-CM | POA: Insufficient documentation

## 2020-06-20 HISTORY — DX: Other complications of anesthesia, initial encounter: T88.59XA

## 2020-06-20 HISTORY — DX: Cardiac murmur, unspecified: R01.1

## 2020-06-20 LAB — BASIC METABOLIC PANEL
Anion gap: 10 (ref 5–15)
BUN: 17 mg/dL (ref 8–23)
CO2: 28 mmol/L (ref 22–32)
Calcium: 10.4 mg/dL — ABNORMAL HIGH (ref 8.9–10.3)
Chloride: 102 mmol/L (ref 98–111)
Creatinine, Ser: 0.82 mg/dL (ref 0.44–1.00)
GFR calc Af Amer: >60 mL/min (ref >60)
GFR calc non Af Amer: >60 mL/min (ref >60)
Glucose, Bld: 96 mg/dL (ref 70–99)
Potassium: 4.7 mmol/L (ref 3.5–5.1)
Sodium: 140 mmol/L (ref 135–145)

## 2020-06-20 LAB — CBC
HCT: 49.2 % — ABNORMAL HIGH (ref 36.0–46.0)
Hemoglobin: 16.3 g/dL — ABNORMAL HIGH (ref 12.0–15.0)
MCH: 32.1 pg (ref 26.0–34.0)
MCHC: 33.1 g/dL (ref 30.0–36.0)
MCV: 96.9 fL (ref 80.0–100.0)
Platelets: 299 10*3/uL (ref 150–400)
RBC: 5.08 MIL/uL (ref 3.87–5.11)
RDW: 13.1 % (ref 11.5–15.5)
WBC: 10.5 10*3/uL (ref 4.0–10.5)
nRBC: 0 % (ref 0.0–0.2)

## 2020-06-20 LAB — SURGICAL PCR SCREEN
MRSA, PCR: NEGATIVE
Staphylococcus aureus: NEGATIVE

## 2020-06-21 NOTE — Anesthesia Preprocedure Evaluation (Addendum)
Anesthesia Evaluation  Patient identified by MRN, date of birth, ID band Patient awake    Reviewed: Allergy & Precautions, NPO status , Patient's Chart, lab work & pertinent test results  History of Anesthesia Complications Negative for: history of anesthetic complications  Airway Mallampati: II  TM Distance: >3 FB Neck ROM: Full    Dental  (+) Missing,    Pulmonary neg pulmonary ROS,    Pulmonary exam normal        Cardiovascular hypertension, Pt. on medications Normal cardiovascular exam+ dysrhythmias (on Eliquis (last dose 06/25/20)) Atrial Fibrillation   TTE 2019: moderate LVH, EF 37-10%, grade 2 diastolicdysfunction   Neuro/Psych Anxiety Depression negative neurological ROS     GI/Hepatic Neg liver ROS, GERD  ,  Endo/Other  Hypothyroidism   Renal/GU negative Renal ROS  negative genitourinary   Musculoskeletal  (+) Arthritis ,   Abdominal   Peds  Hematology negative hematology ROS (+)   Anesthesia Other Findings Day of surgery medications reviewed with patient.  Reproductive/Obstetrics negative OB ROS                           Anesthesia Physical Anesthesia Plan  ASA: II  Anesthesia Plan: Spinal   Post-op Pain Management:  Regional for Post-op pain   Induction:   PONV Risk Score and Plan: Treatment may vary due to age or medical condition, Ondansetron, Propofol infusion and Dexamethasone  Airway Management Planned: Natural Airway and Simple Face Mask  Additional Equipment:   Intra-op Plan:   Post-operative Plan:   Informed Consent: I have reviewed the patients History and Physical, chart, labs and discussed the procedure including the risks, benefits and alternatives for the proposed anesthesia with the patient or authorized representative who has indicated his/her understanding and acceptance.       Plan Discussed with: CRNA  Anesthesia Plan Comments: (Per  cardiology 06/11/2020, "Preoperative cardiac evaluation prior to right total knee arthroplasty in early June.  RCRI cardiac risk calculator indicates low risk, 0.4% chance of major adverse cardiac event.  She will be holding Eliquis 3 days prior to the operation.")      Anesthesia Quick Evaluation

## 2020-06-22 DIAGNOSIS — E039 Hypothyroidism, unspecified: Secondary | ICD-10-CM | POA: Diagnosis not present

## 2020-06-22 DIAGNOSIS — Z299 Encounter for prophylactic measures, unspecified: Secondary | ICD-10-CM | POA: Diagnosis not present

## 2020-06-22 DIAGNOSIS — I4891 Unspecified atrial fibrillation: Secondary | ICD-10-CM | POA: Diagnosis not present

## 2020-06-22 DIAGNOSIS — N390 Urinary tract infection, site not specified: Secondary | ICD-10-CM | POA: Diagnosis not present

## 2020-06-22 DIAGNOSIS — I10 Essential (primary) hypertension: Secondary | ICD-10-CM | POA: Diagnosis not present

## 2020-06-26 ENCOUNTER — Other Ambulatory Visit (HOSPITAL_COMMUNITY)
Admission: RE | Admit: 2020-06-26 | Discharge: 2020-06-26 | Disposition: A | Discharge status: Home / Self Care | Payer: Medicare Other | Source: Ambulatory Visit | Attending: Orthopedic Surgery | Admitting: Orthopedic Surgery

## 2020-06-26 DIAGNOSIS — Z01812 Encounter for preprocedural laboratory examination: Secondary | ICD-10-CM | POA: Insufficient documentation

## 2020-06-26 DIAGNOSIS — Z20822 Contact with and (suspected) exposure to covid-19: Secondary | ICD-10-CM | POA: Diagnosis not present

## 2020-06-26 LAB — SARS CORONAVIRUS 2 (TAT 6-24 HRS): SARS Coronavirus 2: NEGATIVE

## 2020-06-28 NOTE — Progress Notes (Signed)
Pt. Aware of time change to arrive at 0530 . Drink completed by 0430 am

## 2020-06-29 ENCOUNTER — Encounter (HOSPITAL_COMMUNITY)
Admission: RE | Disposition: A | Discharge status: Home / Self Care | Payer: Self-pay | Source: Home / Self Care | Attending: Orthopedic Surgery

## 2020-06-29 ENCOUNTER — Inpatient Hospital Stay (HOSPITAL_COMMUNITY)
Admission: RE | Admit: 2020-06-29 | Discharge: 2020-07-01 | DRG: 470 | Disposition: A | Discharge status: Home / Self Care | Payer: Medicare Other | Attending: Orthopedic Surgery | Admitting: Orthopedic Surgery

## 2020-06-29 ENCOUNTER — Other Ambulatory Visit: Payer: Self-pay

## 2020-06-29 ENCOUNTER — Encounter (HOSPITAL_COMMUNITY): Payer: Self-pay | Admitting: Orthopedic Surgery

## 2020-06-29 ENCOUNTER — Ambulatory Visit (HOSPITAL_COMMUNITY): Payer: Medicare Other | Admitting: Anesthesiology

## 2020-06-29 ENCOUNTER — Ambulatory Visit (HOSPITAL_COMMUNITY): Payer: Medicare Other | Admitting: Physician Assistant

## 2020-06-29 DIAGNOSIS — E785 Hyperlipidemia, unspecified: Secondary | ICD-10-CM | POA: Diagnosis not present

## 2020-06-29 DIAGNOSIS — E039 Hypothyroidism, unspecified: Secondary | ICD-10-CM | POA: Diagnosis not present

## 2020-06-29 DIAGNOSIS — G8918 Other acute postprocedural pain: Secondary | ICD-10-CM | POA: Diagnosis not present

## 2020-06-29 DIAGNOSIS — Z79899 Other long term (current) drug therapy: Secondary | ICD-10-CM | POA: Diagnosis not present

## 2020-06-29 DIAGNOSIS — Z96651 Presence of right artificial knee joint: Secondary | ICD-10-CM

## 2020-06-29 DIAGNOSIS — E7800 Pure hypercholesterolemia, unspecified: Secondary | ICD-10-CM | POA: Diagnosis not present

## 2020-06-29 DIAGNOSIS — Z7901 Long term (current) use of anticoagulants: Secondary | ICD-10-CM

## 2020-06-29 DIAGNOSIS — I4891 Unspecified atrial fibrillation: Secondary | ICD-10-CM | POA: Diagnosis not present

## 2020-06-29 DIAGNOSIS — Z20822 Contact with and (suspected) exposure to covid-19: Secondary | ICD-10-CM | POA: Diagnosis present

## 2020-06-29 DIAGNOSIS — F418 Other specified anxiety disorders: Secondary | ICD-10-CM | POA: Diagnosis present

## 2020-06-29 DIAGNOSIS — I10 Essential (primary) hypertension: Secondary | ICD-10-CM | POA: Diagnosis not present

## 2020-06-29 DIAGNOSIS — M1711 Unilateral primary osteoarthritis, right knee: Secondary | ICD-10-CM | POA: Diagnosis not present

## 2020-06-29 DIAGNOSIS — Z79890 Hormone replacement therapy (postmenopausal): Secondary | ICD-10-CM

## 2020-06-29 HISTORY — PX: TOTAL KNEE ARTHROPLASTY: SHX125

## 2020-06-29 SURGERY — ARTHROPLASTY, KNEE, TOTAL
Anesthesia: Spinal | Site: Knee | Laterality: Right

## 2020-06-29 MED ORDER — PROPOFOL 500 MG/50ML IV EMUL
INTRAVENOUS | Status: AC
  Filled 2020-06-29: qty 50

## 2020-06-29 MED ORDER — APIXABAN 5 MG PO TABS
5.0000 mg | ORAL_TABLET | Freq: Two times a day (BID) | ORAL | Status: DC
  Administered 2020-06-30 – 2020-07-01 (×3): 5 mg via ORAL
  Filled 2020-06-29 (×3): qty 1

## 2020-06-29 MED ORDER — METOCLOPRAMIDE HCL 5 MG PO TABS: 5.0000 mg | ORAL_TABLET | Freq: Three times a day (TID) | ORAL | Status: DC | PRN

## 2020-06-29 MED ORDER — METHOCARBAMOL 500 MG PO TABS: 500.0000 mg | ORAL_TABLET | Freq: Three times a day (TID) | ORAL | 1 refills | Status: DC | PRN

## 2020-06-29 MED ORDER — METHOCARBAMOL 500 MG PO TABS
500.0000 mg | ORAL_TABLET | Freq: Four times a day (QID) | ORAL | Status: DC | PRN
  Administered 2020-06-29 – 2020-07-01 (×3): 500 mg via ORAL
  Filled 2020-06-29 (×3): qty 1

## 2020-06-29 MED ORDER — PHENYLEPHRINE 40 MCG/ML (10ML) SYRINGE FOR IV PUSH (FOR BLOOD PRESSURE SUPPORT)
PREFILLED_SYRINGE | INTRAVENOUS | Status: AC
  Filled 2020-06-29: qty 10

## 2020-06-29 MED ORDER — ONDANSETRON HCL 4 MG/2ML IJ SOLN
INTRAMUSCULAR | Status: AC
  Filled 2020-06-29: qty 2

## 2020-06-29 MED ORDER — SODIUM CHLORIDE 0.9 % IR SOLN
Status: DC | PRN
  Administered 2020-06-29: 1000 mL

## 2020-06-29 MED ORDER — POLYETHYLENE GLYCOL 3350 17 G PO PACK
17.0000 g | PACK | Freq: Every day | ORAL | Status: DC | PRN
  Administered 2020-07-01: 17 g via ORAL

## 2020-06-29 MED ORDER — BISACODYL 10 MG RE SUPP: 10.0000 mg | Freq: Every day | RECTAL | Status: DC | PRN

## 2020-06-29 MED ORDER — ACETAMINOPHEN 500 MG PO TABS
1000.0000 mg | ORAL_TABLET | Freq: Four times a day (QID) | ORAL | Status: DC | PRN
  Administered 2020-06-30 – 2020-07-01 (×3): 1000 mg via ORAL
  Filled 2020-06-29 (×3): qty 2

## 2020-06-29 MED ORDER — CLINDAMYCIN PHOSPHATE 600 MG/50ML IV SOLN
600.0000 mg | Freq: Four times a day (QID) | INTRAVENOUS | Status: AC
  Administered 2020-06-29 (×2): 600 mg via INTRAVENOUS
  Filled 2020-06-29 (×2): qty 50

## 2020-06-29 MED ORDER — POLYETHYLENE GLYCOL 3350 17 G PO PACK
17.0000 g | PACK | Freq: Every day | ORAL | Status: DC | PRN
  Filled 2020-06-29: qty 1

## 2020-06-29 MED ORDER — FENTANYL CITRATE (PF) 100 MCG/2ML IJ SOLN
INTRAMUSCULAR | Status: DC | PRN
  Administered 2020-06-29 (×2): 50 ug via INTRAVENOUS

## 2020-06-29 MED ORDER — FENTANYL CITRATE (PF) 100 MCG/2ML IJ SOLN
INTRAMUSCULAR | Status: AC
  Filled 2020-06-29: qty 2

## 2020-06-29 MED ORDER — ACETAMINOPHEN-CODEINE #3 300-30 MG PO TABS: 1.0000 | ORAL_TABLET | ORAL | 0 refills | Status: DC | PRN

## 2020-06-29 MED ORDER — PROPOFOL 1000 MG/100ML IV EMUL
INTRAVENOUS | Status: AC
  Filled 2020-06-29: qty 100

## 2020-06-29 MED ORDER — OXYCODONE HCL 5 MG/5ML PO SOLN: 5.0000 mg | Freq: Once | ORAL | Status: DC | PRN

## 2020-06-29 MED ORDER — ONDANSETRON HCL 4 MG/2ML IJ SOLN
4.0000 mg | Freq: Four times a day (QID) | INTRAMUSCULAR | Status: DC | PRN
  Administered 2020-06-30: 4 mg via INTRAVENOUS
  Filled 2020-06-29: qty 2

## 2020-06-29 MED ORDER — LACTATED RINGERS IV SOLN: INTRAVENOUS | Status: DC

## 2020-06-29 MED ORDER — FERROUS SULFATE 325 (65 FE) MG PO TABS
325.0000 mg | ORAL_TABLET | Freq: Three times a day (TID) | ORAL | Status: DC
  Administered 2020-06-29 – 2020-07-01 (×4): 325 mg via ORAL
  Filled 2020-06-29 (×4): qty 1

## 2020-06-29 MED ORDER — BUPIVACAINE IN DEXTROSE 0.75-8.25 % IT SOLN
INTRATHECAL | Status: DC | PRN
  Administered 2020-06-29: 1.6 mL via INTRATHECAL

## 2020-06-29 MED ORDER — LEVOTHYROXINE SODIUM 75 MCG PO TABS
75.0000 ug | ORAL_TABLET | Freq: Every day | ORAL | Status: DC
  Administered 2020-06-30 – 2020-07-01 (×2): 75 ug via ORAL
  Filled 2020-06-29 (×2): qty 1

## 2020-06-29 MED ORDER — CITALOPRAM HYDROBROMIDE 20 MG PO TABS
20.0000 mg | ORAL_TABLET | Freq: Every day | ORAL | Status: DC
  Administered 2020-06-29 – 2020-07-01 (×3): 20 mg via ORAL
  Filled 2020-06-29 (×3): qty 1

## 2020-06-29 MED ORDER — WATER FOR IRRIGATION, STERILE IR SOLN
Status: DC | PRN
  Administered 2020-06-29: 2000 mL

## 2020-06-29 MED ORDER — PHENOL 1.4 % MT LIQD: 1.0000 | OROMUCOSAL | Status: DC | PRN

## 2020-06-29 MED ORDER — MIDAZOLAM HCL 2 MG/2ML IJ SOLN
INTRAMUSCULAR | Status: AC
  Filled 2020-06-29: qty 2

## 2020-06-29 MED ORDER — MAGNESIUM OXIDE 400 (241.3 MG) MG PO TABS
400.0000 mg | ORAL_TABLET | Freq: Every day | ORAL | Status: DC
  Administered 2020-06-29 – 2020-07-01 (×3): 400 mg via ORAL
  Filled 2020-06-29 (×3): qty 1

## 2020-06-29 MED ORDER — MENTHOL 3 MG MT LOZG: 1.0000 | LOZENGE | OROMUCOSAL | Status: DC | PRN

## 2020-06-29 MED ORDER — ACETAMINOPHEN 500 MG PO TABS
1000.0000 mg | ORAL_TABLET | Freq: Once | ORAL | Status: AC
  Administered 2020-06-29: 1000 mg via ORAL
  Filled 2020-06-29: qty 2

## 2020-06-29 MED ORDER — ONDANSETRON HCL 4 MG/2ML IJ SOLN
INTRAMUSCULAR | Status: DC | PRN
  Administered 2020-06-29: 4 mg via INTRAVENOUS

## 2020-06-29 MED ORDER — ADULT MULTIVITAMIN W/MINERALS CH
1.0000 | ORAL_TABLET | Freq: Every day | ORAL | Status: DC
  Administered 2020-06-29 – 2020-07-01 (×3): 1 via ORAL
  Filled 2020-06-29 (×3): qty 1

## 2020-06-29 MED ORDER — PHENYLEPHRINE HCL-NACL 10-0.9 MG/250ML-% IV SOLN
INTRAVENOUS | Status: DC | PRN
Start: 2020-06-29 — End: 2020-06-29
  Administered 2020-06-29: 25 ug/min via INTRAVENOUS

## 2020-06-29 MED ORDER — ASCORBIC ACID 500 MG PO TABS
500.0000 mg | ORAL_TABLET | Freq: Every day | ORAL | Status: DC
  Administered 2020-06-29 – 2020-07-01 (×3): 500 mg via ORAL
  Filled 2020-06-29 (×3): qty 1

## 2020-06-29 MED ORDER — CLINDAMYCIN PHOSPHATE 900 MG/50ML IV SOLN
900.0000 mg | INTRAVENOUS | Status: AC
  Administered 2020-06-29: 900 mg via INTRAVENOUS
  Filled 2020-06-29: qty 50

## 2020-06-29 MED ORDER — OLOPATADINE HCL 0.1 % OP SOLN
1.0000 [drp] | Freq: Every morning | OPHTHALMIC | Status: DC
  Administered 2020-06-29 – 2020-07-01 (×3): 1 [drp] via OPHTHALMIC
  Filled 2020-06-29: qty 5

## 2020-06-29 MED ORDER — ORAL CARE MOUTH RINSE: 15.0000 mL | Freq: Once | OROMUCOSAL | Status: AC

## 2020-06-29 MED ORDER — ACETAMINOPHEN-CODEINE #3 300-30 MG PO TABS
1.0000 | ORAL_TABLET | ORAL | Status: DC | PRN
  Administered 2020-06-29 – 2020-06-30 (×3): 1 via ORAL
  Filled 2020-06-29 (×3): qty 1

## 2020-06-29 MED ORDER — DOCUSATE SODIUM 100 MG PO CAPS
100.0000 mg | ORAL_CAPSULE | Freq: Two times a day (BID) | ORAL | Status: DC
  Administered 2020-06-29 – 2020-07-01 (×5): 100 mg via ORAL
  Filled 2020-06-29 (×5): qty 1

## 2020-06-29 MED ORDER — VITAMIN D 25 MCG (1000 UNIT) PO TABS
1000.0000 [IU] | ORAL_TABLET | Freq: Every day | ORAL | Status: DC
  Administered 2020-06-29 – 2020-07-01 (×3): 1000 [IU] via ORAL
  Filled 2020-06-29 (×5): qty 1

## 2020-06-29 MED ORDER — METOCLOPRAMIDE HCL 5 MG/ML IJ SOLN
5.0000 mg | Freq: Three times a day (TID) | INTRAMUSCULAR | Status: DC | PRN
  Administered 2020-06-30: 5 mg via INTRAVENOUS
  Filled 2020-06-29: qty 2

## 2020-06-29 MED ORDER — DEXAMETHASONE SODIUM PHOSPHATE 10 MG/ML IJ SOLN
INTRAMUSCULAR | Status: DC | PRN
  Administered 2020-06-29: 8 mg via INTRAVENOUS

## 2020-06-29 MED ORDER — TRANEXAMIC ACID-NACL 1000-0.7 MG/100ML-% IV SOLN
1000.0000 mg | Freq: Once | INTRAVENOUS | Status: AC
  Administered 2020-06-29: 1000 mg via INTRAVENOUS
  Filled 2020-06-29: qty 100

## 2020-06-29 MED ORDER — ONDANSETRON HCL 4 MG PO TABS: 4.0000 mg | ORAL_TABLET | Freq: Three times a day (TID) | ORAL | 1 refills | Status: DC | PRN

## 2020-06-29 MED ORDER — MORPHINE SULFATE (PF) 2 MG/ML IV SOLN: 0.5000 mg | INTRAVENOUS | Status: DC | PRN

## 2020-06-29 MED ORDER — PROPOFOL 10 MG/ML IV BOLUS
INTRAVENOUS | Status: AC
  Filled 2020-06-29: qty 20

## 2020-06-29 MED ORDER — PROMETHAZINE HCL 25 MG/ML IJ SOLN: 6.2500 mg | INTRAMUSCULAR | Status: DC | PRN

## 2020-06-29 MED ORDER — ONDANSETRON HCL 4 MG PO TABS
4.0000 mg | ORAL_TABLET | Freq: Four times a day (QID) | ORAL | Status: DC | PRN
  Administered 2020-06-29: 4 mg via ORAL
  Filled 2020-06-29: qty 1

## 2020-06-29 MED ORDER — PHENYLEPHRINE 40 MCG/ML (10ML) SYRINGE FOR IV PUSH (FOR BLOOD PRESSURE SUPPORT)
PREFILLED_SYRINGE | INTRAVENOUS | Status: DC | PRN
  Administered 2020-06-29: 120 ug via INTRAVENOUS

## 2020-06-29 MED ORDER — OXYCODONE HCL 5 MG PO TABS: 5.0000 mg | ORAL_TABLET | Freq: Once | ORAL | Status: DC | PRN

## 2020-06-29 MED ORDER — POVIDONE-IODINE 10 % EX SWAB
2.0000 "application " | Freq: Once | CUTANEOUS | Status: AC
  Administered 2020-06-29: 2 via TOPICAL

## 2020-06-29 MED ORDER — DILTIAZEM HCL ER COATED BEADS 240 MG PO CP24
240.0000 mg | ORAL_CAPSULE | Freq: Every day | ORAL | Status: DC
  Administered 2020-06-30 – 2020-07-01 (×2): 240 mg via ORAL
  Filled 2020-06-29 (×3): qty 1

## 2020-06-29 MED ORDER — METHOCARBAMOL 1000 MG/10ML IJ SOLN
500.0000 mg | Freq: Four times a day (QID) | INTRAVENOUS | Status: DC | PRN
  Filled 2020-06-29: qty 5

## 2020-06-29 MED ORDER — ALPRAZOLAM 0.25 MG PO TABS
0.2500 mg | ORAL_TABLET | Freq: Two times a day (BID) | ORAL | Status: DC | PRN
  Administered 2020-06-29 – 2020-06-30 (×2): 0.25 mg via ORAL
  Filled 2020-06-29 (×2): qty 1

## 2020-06-29 MED ORDER — 0.9 % SODIUM CHLORIDE (POUR BTL) OPTIME
TOPICAL | Status: DC | PRN
  Administered 2020-06-29: 1000 mL

## 2020-06-29 MED ORDER — SPIRONOLACTONE 25 MG PO TABS
25.0000 mg | ORAL_TABLET | Freq: Every day | ORAL | Status: DC
  Administered 2020-06-29 – 2020-07-01 (×3): 25 mg via ORAL
  Filled 2020-06-29 (×4): qty 1

## 2020-06-29 MED ORDER — ACETAMINOPHEN 325 MG PO TABS
325.0000 mg | ORAL_TABLET | Freq: Four times a day (QID) | ORAL | Status: DC | PRN
  Administered 2020-06-29 – 2020-07-01 (×2): 325 mg via ORAL
  Filled 2020-06-29 (×2): qty 2

## 2020-06-29 MED ORDER — ACETAMINOPHEN 500 MG PO TABS
500.0000 mg | ORAL_TABLET | Freq: Four times a day (QID) | ORAL | Status: AC
  Administered 2020-06-29 – 2020-06-30 (×2): 500 mg via ORAL
  Filled 2020-06-29 (×2): qty 1

## 2020-06-29 MED ORDER — SODIUM CHLORIDE 0.9 % IV SOLN: INTRAVENOUS | Status: DC

## 2020-06-29 MED ORDER — FENTANYL CITRATE (PF) 100 MCG/2ML IJ SOLN: 25.0000 ug | INTRAMUSCULAR | Status: DC | PRN

## 2020-06-29 MED ORDER — DEXAMETHASONE SODIUM PHOSPHATE 10 MG/ML IJ SOLN
INTRAMUSCULAR | Status: AC
  Filled 2020-06-29: qty 1

## 2020-06-29 MED ORDER — BUPIVACAINE-EPINEPHRINE (PF) 0.5% -1:200000 IJ SOLN
INTRAMUSCULAR | Status: DC | PRN
  Administered 2020-06-29: 15 mL via PERINEURAL

## 2020-06-29 MED ORDER — PHENYLEPHRINE HCL (PRESSORS) 10 MG/ML IV SOLN
INTRAVENOUS | Status: AC
  Filled 2020-06-29: qty 1

## 2020-06-29 MED ORDER — TRANEXAMIC ACID-NACL 1000-0.7 MG/100ML-% IV SOLN
1000.0000 mg | INTRAVENOUS | Status: AC
  Administered 2020-06-29: 1000 mg via INTRAVENOUS
  Filled 2020-06-29: qty 100

## 2020-06-29 MED ORDER — PROPOFOL 500 MG/50ML IV EMUL
INTRAVENOUS | Status: DC | PRN
Start: 2020-06-29 — End: 2020-06-29
  Administered 2020-06-29: 110 ug/kg/min via INTRAVENOUS

## 2020-06-29 MED ORDER — CHLORHEXIDINE GLUCONATE 0.12 % MT SOLN
15.0000 mL | Freq: Once | OROMUCOSAL | Status: AC
  Administered 2020-06-29: 15 mL via OROMUCOSAL

## 2020-06-29 MED ORDER — MIDAZOLAM HCL 2 MG/2ML IJ SOLN
INTRAMUSCULAR | Status: DC | PRN
  Administered 2020-06-29: 2 mg via INTRAVENOUS

## 2020-06-29 MED ORDER — SIMVASTATIN 20 MG PO TABS
10.0000 mg | ORAL_TABLET | Freq: Every day | ORAL | Status: DC
  Administered 2020-06-29 – 2020-07-01 (×3): 10 mg via ORAL
  Filled 2020-06-29 (×3): qty 1

## 2020-06-29 SURGICAL SUPPLY — 51 items
BAG ZIPLOCK 12X15 (MISCELLANEOUS) IMPLANT
BLADE SAG 18X100X1.27 (BLADE) ×2 IMPLANT
BLADE SAW SGTL 13X75X1.27 (BLADE) ×2 IMPLANT
BNDG ELASTIC 6X10 VLCR STRL LF (GAUZE/BANDAGES/DRESSINGS) ×2 IMPLANT
BNDG GAUZE ELAST 4 BULKY (GAUZE/BANDAGES/DRESSINGS) ×2 IMPLANT
BOWL SMART MIX CTS (DISPOSABLE) ×2 IMPLANT
CEMENT HV SMART SET (Cement) ×4 IMPLANT
CEMENT TIBIA MBT (Knees) ×1 IMPLANT
COVER SURGICAL LIGHT HANDLE (MISCELLANEOUS) ×2 IMPLANT
COVER WAND RF STERILE (DRAPES) IMPLANT
CUFF TOURN SGL QUICK 34 (TOURNIQUET CUFF) ×2
CUFF TRNQT CYL 34X4.125X (TOURNIQUET CUFF) ×1 IMPLANT
DRAPE SHEET LG 3/4 BI-LAMINATE (DRAPES) ×2 IMPLANT
DRAPE U-SHAPE 47X51 STRL (DRAPES) ×2 IMPLANT
DRSG ADAPTIC 3X8 NADH LF (GAUZE/BANDAGES/DRESSINGS) ×2 IMPLANT
DRSG PAD ABDOMINAL 8X10 ST (GAUZE/BANDAGES/DRESSINGS) ×2 IMPLANT
DURAPREP 26ML APPLICATOR (WOUND CARE) ×2 IMPLANT
ELECT REM PT RETURN 15FT ADLT (MISCELLANEOUS) ×2 IMPLANT
FEMUR SIGMA PS SZ 3.0 R (Femur) ×2 IMPLANT
GAUZE SPONGE 4X4 12PLY STRL (GAUZE/BANDAGES/DRESSINGS) ×2 IMPLANT
GLOVE BIOGEL PI ORTHO PRO 7.5 (GLOVE) ×1
GLOVE BIOGEL PI ORTHO PRO SZ8 (GLOVE) ×1
GLOVE ORTHO TXT STRL SZ7.5 (GLOVE) ×2 IMPLANT
GLOVE PI ORTHO PRO STRL 7.5 (GLOVE) ×1 IMPLANT
GLOVE PI ORTHO PRO STRL SZ8 (GLOVE) ×1 IMPLANT
GLOVE SURG ORTHO 8.5 STRL (GLOVE) ×4 IMPLANT
GOWN STRL REUS W/TWL XL LVL3 (GOWN DISPOSABLE) ×4 IMPLANT
HANDPIECE INTERPULSE COAX TIP (DISPOSABLE) ×2
HOLDER FOLEY CATH W/STRAP (MISCELLANEOUS) ×2 IMPLANT
IMMOBILIZER KNEE 20 (SOFTGOODS) ×2
IMMOBILIZER KNEE 20 THIGH 36 (SOFTGOODS) ×1 IMPLANT
KIT TURNOVER KIT A (KITS) IMPLANT
MANIFOLD NEPTUNE II (INSTRUMENTS) ×2 IMPLANT
NS IRRIG 1000ML POUR BTL (IV SOLUTION) ×2 IMPLANT
PACK TOTAL KNEE CUSTOM (KITS) ×2 IMPLANT
PATELLA DOME PFC 32MM (Knees) ×2 IMPLANT
PENCIL SMOKE EVACUATOR (MISCELLANEOUS) IMPLANT
PIN STEINMAN FIXATION KNEE (PIN) ×2 IMPLANT
PLATE ROT INSERT 12.5MM SIZE 3 (Plate) ×2 IMPLANT
PROTECTOR NERVE ULNAR (MISCELLANEOUS) ×2 IMPLANT
SET HNDPC FAN SPRY TIP SCT (DISPOSABLE) ×1 IMPLANT
STAPLER VISISTAT 35W (STAPLE) IMPLANT
STRIP CLOSURE SKIN 1/2X4 (GAUZE/BANDAGES/DRESSINGS) ×2 IMPLANT
SUT MNCRL AB 3-0 PS2 18 (SUTURE) ×2 IMPLANT
SUT VIC AB 0 CT1 36 (SUTURE) ×2 IMPLANT
SUT VIC AB 1 CT1 36 (SUTURE) ×6 IMPLANT
SUT VIC AB 2-0 CT1 27 (SUTURE) ×2
SUT VIC AB 2-0 CT1 TAPERPNT 27 (SUTURE) ×1 IMPLANT
TIBIA MBT CEMENT (Knees) ×2 IMPLANT
TRAY FOLEY MTR SLVR 16FR STAT (SET/KITS/TRAYS/PACK) ×2 IMPLANT
WATER STERILE IRR 1000ML POUR (IV SOLUTION) ×4 IMPLANT

## 2020-06-29 NOTE — Care Management Obs Status (Signed)
[place] NOTIFICATION   Patient Details  Name: [name] MRN: [id number] Date of Birth: 06/18/1946   Medicare Observation Status Notification Given:  Macario Golds, [place] 06/29/2020, 1:34 PM

## 2020-06-29 NOTE — Evaluation (Signed)
Physical Therapy Evaluation Patient Details Name: [name] MRN: [id number] DOB: [birth year] Today's Date: 06/29/2020   History of Present Illness  s/p R TKA. PMH: L TSA, HTN, anxiety  Clinical Impression  Pt is s/p TKA resulting in the deficits listed below (see PT Problem List).  PT is pleasant and motivated, amb ~ 60' with RW and min assist. Initiated HEP. Anticipate steady progress in acute setting.   Pt will benefit from skilled PT to increase their independence and safety with mobility to allow discharge to the venue listed below.      Follow Up Recommendations Follow surgeon's recommendation for DC plan and follow-up therapies    Equipment Recommendations  None recommended by PT    Recommendations for Other Services       Precautions / Restrictions Precautions Precautions: Fall;Knee Required Braces or Orthoses: Knee Immobilizer - Right Restrictions Weight Bearing Restrictions: No Other Position/Activity Restrictions: WBAT      Mobility  Bed Mobility Overal bed mobility: Needs Assistance Bed Mobility: Supine to Sit     Supine to sit: Min assist     General bed mobility comments: incr time, assist with RLE  Transfers Overall transfer level: Needs assistance Equipment used: Rolling walker (2 wheeled) Transfers: Sit to/from Stand Sit to Stand: Min assist;+2 safety/equipment         General transfer comment: cues for hand placement, pt pulls up on RW, difficulty d/t pain and weakness L knee and KI on R  Ambulation/Gait Ambulation/Gait assistance: Min Web designer (Feet): 28 Feet Assistive device: Rolling walker (2 wheeled) Gait Pattern/deviations: Decreased stance time - right;Step-to pattern;Decreased step length - right;Decreased step length - left     General Gait Details: cues for sequence and RW position  Stairs            Wheelchair Mobility    Modified Rankin (Stroke Patients Only)       Balance                                              Pertinent Vitals/Pain Pain Assessment: 0-10 Pain Score: 5  Pain Location: right knee Pain Descriptors / Indicators: Discomfort;Aching Pain Intervention(s): Limited activity within patient's tolerance;Monitored during session;Premedicated before session;Repositioned;Ice applied;Patient requesting pain meds-RN notified    Home Living Family/patient expects to be discharged to:: Private residence Living Arrangements: Alone Available Help at Discharge: Family;Available 24 hours/day Type of Home: House Home Access: Stairs to enter Entrance Stairs-Rails: Chemical engineer of Steps: 5 Home Layout: One level Home Equipment: Environmental consultant - 2 wheels;Cane - single point;Bedside commode;Wheelchair - manual      Prior Function Level of Independence: Independent               Hand Dominance        Extremity/Trunk Assessment   Upper Extremity Assessment Upper Extremity Assessment: Overall WFL for tasks assessed    Lower Extremity Assessment Lower Extremity Assessment: RLE deficits/detail RLE Deficits / Details: ankle WFL, knee extension and hip flexion 2+/5, limited by postop pain and weakness LLE Deficits / Details: grossly WFL although knee is "bad" per pt and family       Communication   Communication: No difficulties  Cognition Arousal/Alertness: Awake/alert Behavior During Therapy: WFL for tasks assessed/performed Overall Cognitive Status: Within Functional Limits for tasks assessed  General Comments      Exercises Total Joint Exercises Ankle Circles/Pumps: AROM;10 reps;Both Quad Sets: Both;5 reps;AROM Short Arc Quad: Limitations Short Arc Quad Limitations: pain Heel Slides: AAROM;Right (3 reps)   Assessment/Plan    PT Assessment Patient needs continued PT services  PT Problem List Decreased strength;Decreased mobility;Decreased range of motion;Decreased  activity tolerance;Decreased balance;Pain;Decreased knowledge of use of DME       PT Treatment Interventions DME instruction;Therapeutic exercise;Gait training;Functional mobility training;Therapeutic activities;Patient/family education;Stair training    PT Goals (Current goals can be found in the Care Plan section)  Acute Rehab PT Goals Patient Stated Goal: less knee pain PT Goal Formulation: With patient Time For Goal Achievement: 07/06/20 Potential to Achieve Goals: Good    Frequency 7X/week   Barriers to discharge        Co-evaluation               AM-PAC PT "6 Clicks" Mobility  Outcome Measure Help needed turning from your back to your side while in a flat bed without using bedrails?: A Little Help needed moving from lying on your back to sitting on the side of a flat bed without using bedrails?: A Little Help needed moving to and from a bed to a chair (including a wheelchair)?: A Little Help needed standing up from a chair using your arms (e.g., wheelchair or bedside chair)?: A Little Help needed to walk in hospital room?: A Little Help needed climbing 3-5 steps with a railing? : A Lot 6 Click Score: 17    End of Session Equipment Utilized During Treatment: Gait belt;Right knee immobilizer Activity Tolerance: Patient tolerated treatment well;Patient limited by pain Patient left: in chair;with call bell/phone within reach;with chair alarm set;with family/visitor present   PT Visit Diagnosis: Difficulty in walking, not elsewhere classified (R26.2)    Time: 1429-1500 PT Time Calculation (min) (ACUTE ONLY): 31 min   Charges:   PT Evaluation $PT Eval Low Complexity: 1 Low PT Treatments $Gait Training: 8-22 mins        Baxter Flattery, PT  Acute Rehab Dept ([place]) [phone] Pager [phone]  06/29/2020   [facility] 06/29/2020, 3:20 PM

## 2020-06-29 NOTE — Transfer of Care (Signed)
Immediate Anesthesia Transfer of Care Note  Patient: [name]  Procedure(s) Performed: TOTAL KNEE ARTHROPLASTY (Right Knee)  Patient Location: PACU  Anesthesia Type:Spinal  Level of Consciousness: awake  Airway & Oxygen Therapy: Patient Spontanous Breathing and Patient connected to face mask oxygen  Post-op Assessment: Report given to RN and Post -op Vital signs reviewed and stable  Post vital signs: Reviewed and stable  Last Vitals:  Vitals Value Taken Time  BP    Temp    Pulse 64 06/29/20 0936  Resp 11 06/29/20 0936  SpO2 93 % 06/29/20 0936  Vitals shown include unvalidated device data.  Last Pain:  Vitals:   06/29/20 0611  TempSrc:   PainSc: 0-No pain         Complications: No complications documented.

## 2020-06-29 NOTE — Plan of Care (Signed)
Patient admitted to unit, care plans initiated. Deanna Artis, RN

## 2020-06-29 NOTE — Discharge Instructions (Signed)
Ice to the knee constantly.  Keep the incision covered and clean and dry for one week, then ok to get it wet in the shower.  Do exercise as instructed every hour, please to prevent stiffness.    DO NOT prop anything under the knee, it will make your knee stiff.  Prop under the ankle to encourage your knee to go straight.   Use the walker while you are up and around for balance.  Wear your support stockings 24/7 to prevent blood clots and take your blood thinner to prevent blood clots.  Wear your knee brace at night to maintain knee extension while you sleep  Follow up with Dr Veverly Fells in two weeks in the office, call [phone] for appt

## 2020-06-29 NOTE — TOC Transition Note (Signed)
Transition of Care [facility]) - CM/SW Discharge Note   Patient Details  Name: [name] MRN: [id number] Date of Birth: [birth year]  [facility] CM/SW Contact:  Lennart Pall, LCSW Phone Number: 06/29/2020, 1:35 PM   Clinical Narrative:    Met briefly with pt confirming no DME needs and plans for OPPT in [place].  No further TOC needs.   Final next level of care: OP Rehab Barriers to Discharge: No Barriers Identified   Patient Goals and CMS Choice Patient states their goals for this hospitalization and ongoing recovery are:: go home      Discharge Placement                       Discharge Plan and Services                DME Arranged: N/A DME Agency: NA       HH Arranged: NA HH Agency: NA        Social Determinants of Health (SDOH) Interventions     Readmission Risk Interventions No flowsheet data found.

## 2020-06-29 NOTE — Care Management CC44 (Signed)
Condition Code 44 Documentation Completed  Patient Details  Name: [name] MRN: [id number] Date of Birth: June 04, 1946   Condition Code 44 given:  Yes Patient signature on Condition Code 44 notice:  Yes Documentation of 2 MD's agreement:  Yes Code 44 added to claim:  Yes    Oaklie Durrett, LCSW 06/29/2020, 1:34 PM

## 2020-06-29 NOTE — Care Plan (Signed)
Ortho Bundle Case Management Note  Patient Details  Name: [name] MRN: [id number] Date of Birth: 28-Feb-1946  R TKA on 06-29-20 DCP:  Home with dtr.  1 story home with 5 ste. DME:  No needs.  Has a RW and 3-in-1. PT:  [facility] ([place]).  PT eval scheduled for 07-03-20 at 11:00 am.                    DME Arranged:  N/A DME Agency:  NA  HH Arranged:  NA HH Agency:  NA  Additional Comments: Please contact me with any questions of if this plan should need to change.  Marianne Sofia, RN,CCM [facility]  [phone] 06/29/2020, 8:28 AM

## 2020-06-29 NOTE — Anesthesia Procedure Notes (Signed)
Spinal  Patient location during procedure: OR Start time: 06/29/2020 7:36 AM End time: 06/29/2020 7:41 AM Staffing Performed: resident/CRNA  Resident/CRNA: Niel Hummer, CRNA Preanesthetic Checklist Completed: patient identified, IV checked, risks and benefits discussed, surgical consent, monitors and equipment checked, pre-op evaluation and timeout performed Spinal Block Patient position: sitting Prep: DuraPrep Patient monitoring: heart rate, cardiac monitor, continuous pulse ox and blood pressure Approach: midline Location: L3-4 Injection technique: single-shot Needle Needle type: Pencan  Needle gauge: 24 G Needle length: 10 cm

## 2020-06-29 NOTE — Progress Notes (Signed)
Orthopedic Tech Progress Note Patient Details:  [name] Dec 12, 1945 [id number]  CPM Right Knee CPM Right Knee: On Right Knee Flexion (Degrees): 60 Right Knee Extension (Degrees): 0  Post Interventions Patient Tolerated: Well Instructions Provided: Care of device  Braulio Bosch 06/29/2020, 12:14 PM

## 2020-06-29 NOTE — Interval H&P Note (Signed)
History and Physical Interval Note:  06/29/2020 7:27 AM  [name]  has presented today for surgery, with the diagnosis of Right knee end stage osteoarthritis.  The various methods of treatment have been discussed with the patient and family. After consideration of risks, benefits and other options for treatment, the patient has consented to  Procedure(s) with comments: TOTAL KNEE ARTHROPLASTY (Right) - adductor canal as a surgical intervention.  The patient's history has been reviewed, patient examined, no change in status, stable for surgery.  I have reviewed the patient's chart and labs.  Questions were answered to the patient's satisfaction.     [name]

## 2020-06-29 NOTE — Progress Notes (Signed)
Orthopedic Tech Progress Note Patient Details:  [name] [birth year] [id number]  CPM Right Knee CPM Right Knee: Off Right Knee Flexion (Degrees): 60 Right Knee Extension (Degrees): 0  Post Interventions Patient Tolerated: Well Instructions Provided: Care of device  [name] 06/29/2020, 4:09 PM

## 2020-06-29 NOTE — Brief Op Note (Signed)
06/29/2020  9:38 AM  PATIENT:  [name]  74 y.o. female  PRE-OPERATIVE DIAGNOSIS:  Right knee end stage osteoarthritis  POST-OPERATIVE DIAGNOSIS:  Right knee end stage osteoarthritis  PROCEDURE:  Procedure(s) with comments: TOTAL KNEE ARTHROPLASTY (Right) - adductor canal block DePuy Sigma RP  SURGEON:  Surgeon(s) and Role:    [name], MD - Primary  PHYSICIAN ASSISTANT:   ASSISTANTS: Narda Amber PA-C   ANESTHESIA:   regional and spinal  EBL:  50 mL   BLOOD ADMINISTERED:none  DRAINS: none   LOCAL MEDICATIONS USED:  NONE  SPECIMEN:  No Specimen  DISPOSITION OF SPECIMEN:  N/A  COUNTS:  YES  TOURNIQUET:   Total Tourniquet Time Documented: Thigh (Right) - 87 minutes Total: Thigh (Right) - 87 minutes   DICTATION: .Other Dictation: Dictation Number [id number]  PLAN OF CARE: Admit to inpatient   PATIENT DISPOSITION:  PACU - hemodynamically stable.   Delay start of Pharmacological VTE agent (>24hrs) due to surgical blood loss or risk of bleeding: no

## 2020-06-29 NOTE — Anesthesia Procedure Notes (Signed)
Procedure Name: MAC Date/Time: 06/29/2020 7:35 AM Performed by: Niel Hummer, CRNA Pre-anesthesia Checklist: Patient identified, Emergency Drugs available, Suction available and Patient being monitored Oxygen Delivery Method: Simple face mask

## 2020-06-29 NOTE — Op Note (Signed)
NAME: [name], [name] MEDICAL RECORD [id number] ACCOUNT [id number] DATE OF BIRTH:Apr 21, 1946 FACILITY: [facility] LOCATION: [id number] PHYSICIAN:STEVEN Orlena Sheldon, MD  OPERATIVE REPORT  DATE OF PROCEDURE:  06/29/2020  PREOPERATIVE DIAGNOSIS:  Right knee end-stage osteoarthritis.  POSTOPERATIVE DIAGNOSIS:  Right knee end-stage osteoarthritis.  PROCEDURE PERFORMED:  Right total knee replacement using DePuy Sigma rotating platform prosthesis.  ATTENDING SURGEON:  Esmond Plants MD  ASSISTANT:   Gerrit Halls, PA-C  ANESTHESIA:  Spinal anesthesia plus adductor canal block was used.  ESTIMATED BLOOD LOSS:  Minimal.  FLUID REPLACEMENT:  1500 mL crystalloid.  INSTRUMENT COUNTS:  Correct.  COMPLICATIONS:  No complications.  ANTIBIOTICS:  Perioperative antibiotics were given.  INDICATIONS:  The patient is a 74 year old female with worsening right knee pain and declining function secondary to end-stage arthritis.  The patient has bone-on-bone findings on x-rays and has had progressive decline in terms of her mobility and  increased pain with ambulation interfering with ADLs and sleep.  Having failed an extended period of conservative management, the patient presents now for operative treatment to restore function and eliminate pain.  Informed consent obtained.  DESCRIPTION OF PROCEDURE:  After an adequate level of anesthesia was achieved, the patient was positioned supine on the operating table.  Nonsterile tourniquet was placed on right proximal thigh.  Right leg sterilely prepped and draped in the usual  manner.  Time-out called, verifying correct patient and correct site.  We elevated the limb after a sterile prep and drape and then exsanguinated with an Esmarch bandage.  We then placed the knee in flexion and elevated the tourniquet to 300 mmHg.  We  then performed a longitudinal midline incision utilizing a 10 blade scalpel, dissection down through subcutaneous tissues using the scalpel  blade.  We used a fresh scalpel for the parapatellar arthrotomy on the medial side, everting the patella and  dividing the lateral patellofemoral ligaments.  We entered the distal femur with a step cut drill.  There was eburnated bone noted.  We then placed our intramedullary resection guide, resecting 10 mm of bone off the distal femur set on 5 degrees valgus.   Next, we went ahead and sized our femur to a size 3 anterior down.  We also were careful to reference off the intercondylar axis versus the posterior femoral condyles where the patient had a hypoplastic lateral femoral condyle.  We performed our 4 cuts,  anterior, posterior and chamfer cuts off the 4-in-1 block.  We then went ahead and resected, ACL, PCL, meniscal tissues, subluxing the tibia anteriorly and performed our tibial cut using external alignment jig, cutting minimal off the posterior aspect  of the tibial condyles with a 90 perpendicular to the long axis of the tibia with minimal posterior slope for this posterior cruciate substituting prosthesis.  We checked our gaps, which were symmetric at 10 mm.  We then went ahead and completed our  tibial preparation with the modular drill and keel punch for the 3 tibia and then completed our femoral preparation with the box cut for the posterior cruciate substituting femoral prosthesis.  We used a 3 right femur and then a 10 poly spacer trial and  reduced the knee.  We were pleased with soft tissue balancing, figuring that we could probably get a 12/5 in.  At this point, we resurfaced the patella, going from a size 22 down to a 14.  This was a minimal amount of resection that we could do to get  down to a flush cut to allow  [place] to place a 32 patellar button.  We drilled our lug holes for the 32 button.  We did do a lateral release due to some persistent lateral tilt.  We did not have any frank instability in terms of dislocation or subluxation,  but definitely some lateral tilt that was improved  with a lateral retinacular release.  We then removed all trial components, pulse irrigated and dried the bone well.  We vacuum mixed high viscosity cement on the back table and then cemented the  components into place, tibia, femur, and patella, placing the knee into extension with a 10 mm spacer trial, allowing the cement to harden and then removing excess cement with 1/4-inch curved osteotome.  We did a thorough inspection of the knee.  We were  pleased with the bony support and once the cement was hardened, we removed the trial component and then inserted the real 12/5 poly.  The knee reduced nicely with a little pop as it reduced the femoral condyle and we were able to get full extension, in  fact 1-2 degrees of hyperextension, no problem getting into good flexion and nice and stable at 0, 30 and 60 degrees.  Patellar tracking was very good.  At this point, we irrigated thoroughly and then closed the parapatellar arthrotomy with #1 Vicryl  suture, followed by 0 and 2-0 layered subcutaneous closure and 4-0 Monocryl for skin.  Steri-Strips applied, followed by sterile dressing.  Gerrit Halls PA-C was scrubbed during the entire procedure and necessary for satisfactory completion of surgery  in a timely manner.  VN/NUANCE  D:06/29/2020 T:06/29/2020 JOB:[id number]

## 2020-06-29 NOTE — Anesthesia Postprocedure Evaluation (Signed)
Anesthesia Post Note  Patient: [name]  Procedure(s) Performed: TOTAL KNEE ARTHROPLASTY (Right Knee)     Patient location during evaluation: PACU Anesthesia Type: Spinal Level of consciousness: awake and alert and oriented Pain management: pain level controlled Vital Signs Assessment: post-procedure vital signs reviewed and stable Respiratory status: spontaneous breathing, nonlabored ventilation and respiratory function stable Cardiovascular status: blood pressure returned to baseline Postop Assessment: no apparent nausea or vomiting, spinal receding, no headache and no backache Anesthetic complications: no   No complications documented.  Last Vitals:  Vitals:   06/29/20 1000 06/29/20 1015  BP: 130/65 123/63  Pulse: 66 70  Resp: 17 15  Temp:    SpO2: 95% 98%    Last Pain:  Vitals:   06/29/20 1000  TempSrc:   PainSc: 0-No pain                 Brennan Bailey

## 2020-06-29 NOTE — Anesthesia Procedure Notes (Signed)
Anesthesia Regional Block: Adductor canal block   Pre-Anesthetic Checklist: ,, timeout performed, Correct Patient, Correct Site, Correct Laterality, Correct Procedure, Correct Position, site marked, Risks and benefits discussed, pre-op evaluation,  At surgeon's request and post-op pain management  Laterality: Right  Prep: Maximum Sterile Barrier Precautions used, chloraprep       Needles:  Injection technique: Single-shot  Needle Type: Echogenic Stimulator Needle     Needle Length: 9cm  Needle Gauge: 22     Additional Needles:   Procedures:,,,, ultrasound used (permanent image in chart),,,,  Narrative:  Start time: 06/29/2020 6:57 AM End time: 06/29/2020 7:00 AM Injection made incrementally with aspirations every 5 mL.  Performed by: Personally  Anesthesiologist: Brennan Bailey, MD  Additional Notes: Risks, benefits, and alternative discussed. Patient gave consent for procedure. Patient prepped and draped in sterile fashion. Sedation administered, patient remains easily responsive to voice. Relevant anatomy identified with ultrasound guidance. Local anesthetic given in 5cc increments with no signs or symptoms of intravascular injection. No pain or paraesthesias with injection. Patient monitored throughout procedure with signs of LAST or immediate complications. Tolerated well. Ultrasound image placed in chart.  Tawny Asal, MD

## 2020-06-29 NOTE — Progress Notes (Signed)
Orthopedic Tech Progress Note Patient Details:  [name] 12-29-1945 [id number]  Ortho Devices Type of Ortho Device: Bone foam zero knee Ortho Device/Splint Location: right Ortho Device/Splint Interventions: Application   Post Interventions Patient Tolerated: Well Instructions Provided: Care of device   [name] 06/29/2020, 11:39 AM

## 2020-06-30 DIAGNOSIS — F418 Other specified anxiety disorders: Secondary | ICD-10-CM | POA: Diagnosis present

## 2020-06-30 DIAGNOSIS — E785 Hyperlipidemia, unspecified: Secondary | ICD-10-CM | POA: Diagnosis present

## 2020-06-30 DIAGNOSIS — Z7901 Long term (current) use of anticoagulants: Secondary | ICD-10-CM | POA: Diagnosis not present

## 2020-06-30 DIAGNOSIS — I4891 Unspecified atrial fibrillation: Secondary | ICD-10-CM | POA: Diagnosis present

## 2020-06-30 DIAGNOSIS — Z20822 Contact with and (suspected) exposure to covid-19: Secondary | ICD-10-CM | POA: Diagnosis present

## 2020-06-30 DIAGNOSIS — I10 Essential (primary) hypertension: Secondary | ICD-10-CM | POA: Diagnosis present

## 2020-06-30 DIAGNOSIS — M1711 Unilateral primary osteoarthritis, right knee: Secondary | ICD-10-CM | POA: Diagnosis present

## 2020-06-30 DIAGNOSIS — Z79890 Hormone replacement therapy (postmenopausal): Secondary | ICD-10-CM | POA: Diagnosis not present

## 2020-06-30 DIAGNOSIS — E039 Hypothyroidism, unspecified: Secondary | ICD-10-CM | POA: Diagnosis present

## 2020-06-30 DIAGNOSIS — Z79899 Other long term (current) drug therapy: Secondary | ICD-10-CM | POA: Diagnosis not present

## 2020-06-30 LAB — CBC
HCT: 45 % (ref 36.0–46.0)
Hemoglobin: 14.6 g/dL (ref 12.0–15.0)
MCH: 31.6 pg (ref 26.0–34.0)
MCHC: 32.4 g/dL (ref 30.0–36.0)
MCV: 97.4 fL (ref 80.0–100.0)
Platelets: 276 10*3/uL (ref 150–400)
RBC: 4.62 MIL/uL (ref 3.87–5.11)
RDW: 12.8 % (ref 11.5–15.5)
WBC: 18.8 10*3/uL — ABNORMAL HIGH (ref 4.0–10.5)
nRBC: 0 % (ref 0.0–0.2)

## 2020-06-30 LAB — BASIC METABOLIC PANEL
Anion gap: 8 (ref 5–15)
BUN: 12 mg/dL (ref 8–23)
CO2: 22 mmol/L (ref 22–32)
Calcium: 9.1 mg/dL (ref 8.9–10.3)
Chloride: 106 mmol/L (ref 98–111)
Creatinine, Ser: 0.71 mg/dL (ref 0.44–1.00)
GFR calc Af Amer: >60 mL/min (ref >60)
GFR calc non Af Amer: >60 mL/min (ref >60)
Glucose, Bld: 156 mg/dL — ABNORMAL HIGH (ref 70–99)
Potassium: 4.3 mmol/L (ref 3.5–5.1)
Sodium: 136 mmol/L (ref 135–145)

## 2020-06-30 NOTE — Progress Notes (Signed)
Physical Therapy Treatment Patient Details Name: [name] MRN: [id number] DOB: [birth year] Today's Date: 06/30/2020    History of Present Illness s/p R TKA. PMH: L TSA, HTN, anxiety    PT Comments    Pt progressing well. incr gait distance/tolerance    Follow Up Recommendations  Follow surgeon's recommendation for DC plan and follow-up therapies     Equipment Recommendations  None recommended by PT    Recommendations for Other Services       Precautions / Restrictions Precautions Precautions: Fall;Knee Required Braces or Orthoses: Knee Immobilizer - Right Restrictions Weight Bearing Restrictions: No RLE Weight Bearing: Weight bearing as tolerated    Mobility  Bed Mobility Overal bed mobility: Needs Assistance Bed Mobility: Supine to Sit     Supine to sit: Min assist     General bed mobility comments: incr time, assist with RLE  Transfers Overall transfer level: Needs assistance Equipment used: Rolling walker (2 wheeled)   Sit to Stand: Min guard         General transfer comment: cues for hand placement   Ambulation/Gait Ambulation/Gait assistance: Min guard Gait Distance (Feet): 60 Feet Assistive device: Rolling walker (2 wheeled) Gait Pattern/deviations: Decreased stance time - right;Step-to pattern;Decreased step length - right;Decreased step length - left     General Gait Details: cues for sequence and RW position   Stairs             Wheelchair Mobility    Modified Rankin (Stroke Patients Only)       Balance                                            Cognition Arousal/Alertness: Awake/alert Behavior During Therapy: WFL for tasks assessed/performed Overall Cognitive Status: Within Functional Limits for tasks assessed                                        Exercises Total Joint Exercises Ankle Circles/Pumps: AROM;10 reps;Both    General Comments        Pertinent Vitals/Pain Pain  Assessment: 0-10 Pain Score: 4  Pain Location: right knee Pain Descriptors / Indicators: Discomfort;Aching Pain Intervention(s): Limited activity within patient's tolerance;Monitored during session;Repositioned;Patient requesting pain meds-RN notified    Home Living                      Prior Function            PT Goals (current goals can now be found in the care plan section) Acute Rehab PT Goals Patient Stated Goal: less knee pain PT Goal Formulation: With patient Time For Goal Achievement: 07/06/20 Potential to Achieve Goals: Good Progress towards PT goals: Progressing toward goals    Frequency    7X/week      PT Plan Current plan remains appropriate    Co-evaluation              AM-PAC PT "6 Clicks" Mobility   Outcome Measure  Help needed turning from your back to your side while in a flat bed without using bedrails?: A Little Help needed moving from lying on your back to sitting on the side of a flat bed without using bedrails?: A Little Help needed moving to and from a bed to a chair (including  a wheelchair)?: A Little Help needed standing up from a chair using your arms (e.g., wheelchair or bedside chair)?: A Little Help needed to walk in hospital room?: A Little Help needed climbing 3-5 steps with a railing? : A Lot 6 Click Score: 17    End of Session         PT Visit Diagnosis: Difficulty in walking, not elsewhere classified (R26.2)     Time: 5003-7048 PT Time Calculation (min) (ACUTE ONLY): 19 min  Charges:  $Gait Training: 8-22 mins                     Baxter Flattery, PT  Acute Rehab Dept ([place]) [phone] Pager [phone]  06/30/2020    [facility] 06/30/2020, 12:05 PM

## 2020-06-30 NOTE — Progress Notes (Signed)
Orthopedics Progress Note  Subjective: Right knee pain this morning, otherwise feeling well  Objective:  Vitals:   06/30/20 0139 06/30/20 0630  BP: 131/64 137/75  Pulse: 73 65  Resp: 20 16  Temp: 98.1 F (36.7 C) 98 F (36.7 C)  SpO2: 96% 97%    General: Awake and alert  Musculoskeletal: right knee with dressing intact, good quad set and calf pumps and heel slides Neurovascularly intact  Lab Results  Component Value Date   WBC 18.8 (H) 06/30/2020   HGB 14.6 06/30/2020   HCT 45.0 06/30/2020   MCV 97.4 06/30/2020   PLT 276 06/30/2020       Component Value Date/Time   NA 136 06/30/2020 0255   K 4.3 06/30/2020 0255   CL 106 06/30/2020 0255   CO2 22 06/30/2020 0255   GLUCOSE 156 (H) 06/30/2020 0255   BUN 12 06/30/2020 0255   CREATININE 0.71 06/30/2020 0255   CREATININE 0.77 05/30/2019 1417   CALCIUM 9.1 06/30/2020 0255   GFRNONAA >60 06/30/2020 0255   GFRAA >60 06/30/2020 0255    No results found for: INR, PROTIME  Assessment/Plan: POD #1 s/p Procedure(s): TOTAL KNEE ARTHROPLASTY Plan up with therapy.  Eliquis re-started. Mechanical prophylaxis as well. Plan discharge tomorrow after therapy Outpatient therapy scheduled for next Wednesday  Doran Heater. Veverly Fells, MD 06/30/2020 9:09 AM

## 2020-06-30 NOTE — Plan of Care (Signed)
  Problem: Activity: Goal: Ability to avoid complications of mobility impairment will improve Outcome: Progressing Goal: Ability to tolerate increased activity will improve Outcome: Progressing   Problem: Coping: Goal: Level of anxiety will decrease Outcome: Progressing   Problem: Respiratory: Goal: Ability to maintain a clear airway will improve Outcome: Progressing

## 2020-06-30 NOTE — Progress Notes (Signed)
PT TX NOTE  06/30/20 1400  PT Visit Information  Last PT Received On 06/30/20  Pt limited by pain and nausea this pm, overall not feeling well. Bed changed, pt set up to brush teeth as she had previously requested, had not been assisted. dtr present and assisting with completion. BP 159/95. RN aware of BP, pain and nausea.  Assistance Needed +1  History of Present Illness s/p R TKA. PMH: L TSA, HTN, anxiety  Subjective Data  Patient Stated Goal less knee pain  Precautions  Precautions Fall;Knee  Required Braces or Orthoses Knee Immobilizer - Right  Restrictions  Weight Bearing Restrictions No  Other Position/Activity Restrictions WBAT  Pain Assessment  Pain Assessment 0-10  Pain Score 6  Pain Location right knee  Pain Descriptors / Indicators Discomfort;Aching  Pain Intervention(s) Limited activity within patient's tolerance;Monitored during session;Premedicated before session;Repositioned  Cognition  Arousal/Alertness Awake/alert  Behavior During Therapy WFL for tasks assessed/performed  Overall Cognitive Status Within Functional Limits for tasks assessed  Bed Mobility  Overal bed mobility Needs Assistance  Bed Mobility Sit to Supine  Supine to sit Min guard;Supervision  General bed mobility comments incr time  Transfers  Overall transfer level Needs assistance  Equipment used Rolling walker (2 wheeled)  Sit to Stand Min guard  General transfer comment cues for hand placement   Ambulation/Gait  Ambulation/Gait assistance Min guard  Gait Distance (Feet) 40 Feet  Assistive device Rolling walker (2 wheeled)  Gait Pattern/deviations Decreased stance time - right;Step-to pattern;Decreased step length - right;Decreased step length - left  General Gait Details cues for sequence and RW position. limited by fatigue and nausea  Total Joint Exercises  Ankle Circles/Pumps AROM;10 reps;Both  Quad Sets Both;5 reps;AROM  Heel Slides AAROM;Right;10 reps  Straight Leg Raises  AROM;Right;10 reps  PT - End of Session  Equipment Utilized During Treatment Gait belt;Right knee immobilizer  Activity Tolerance Other (comment);Patient limited by pain (limited by nausea)  Patient left in bed;with call bell/phone within reach;with bed alarm set;with family/visitor present   PT - Assessment/Plan  PT Plan Current plan remains appropriate  PT Visit Diagnosis Difficulty in walking, not elsewhere classified (R26.2)  PT Frequency (ACUTE ONLY) 7X/week  Follow Up Recommendations Follow surgeon's recommendation for DC plan and follow-up therapies  PT equipment None recommended by PT  AM-PAC PT "6 Clicks" Mobility Outcome Measure (Version 2)  Help needed turning from your back to your side while in a flat bed without using bedrails? 3  Help needed moving from lying on your back to sitting on the side of a flat bed without using bedrails? 3  Help needed moving to and from a bed to a chair (including a wheelchair)? 3  Help needed standing up from a chair using your arms (e.g., wheelchair or bedside chair)? 3  Help needed to walk in hospital room? 3  Help needed climbing 3-5 steps with a railing?  2  6 Click Score 17  Consider Recommendation of Discharge To: Home with [facility]  Acute Rehab PT Goals  PT Goal Formulation With patient  Time For Goal Achievement 07/06/20  Potential to Achieve Goals Good  PT Time Calculation  PT Start Time (ACUTE ONLY) 1359  PT Stop Time (ACUTE ONLY) 1424  PT Time Calculation (min) (ACUTE ONLY) 25 min  PT General Charges  $$ ACUTE PT VISIT 1 Visit  PT Treatments  $Gait Training 8-22 mins  $Therapeutic Exercise 8-22 mins

## 2020-06-30 NOTE — Progress Notes (Signed)
Orthopedic Tech Progress Note Patient Details:  [name] 05/02/1946 [id number]  CPM Right Knee CPM Right Knee: On Right Knee Flexion (Degrees): 60 Right Knee Extension (Degrees): 0  Post Interventions Patient Tolerated: Well Instructions Provided: Care of device  [name] 06/30/2020, 2:57 PM

## 2020-06-30 NOTE — Progress Notes (Signed)
Orthopedic Tech Progress Note Patient Details:  [name] [birth year] [id number]  CPM Right Knee CPM Right Knee: Off Right Knee Flexion (Degrees): 60 Right Knee Extension (Degrees): 0  Post Interventions Patient Tolerated: Well Instructions Provided: Care of device  [name] 06/30/2020, 3:46 PM

## 2020-07-01 LAB — CBC
HCT: 43.8 % (ref 36.0–46.0)
Hemoglobin: 14.4 g/dL (ref 12.0–15.0)
MCH: 32.1 pg (ref 26.0–34.0)
MCHC: 32.9 g/dL (ref 30.0–36.0)
MCV: 97.6 fL (ref 80.0–100.0)
Platelets: 267 10*3/uL (ref 150–400)
RBC: 4.49 MIL/uL (ref 3.87–5.11)
RDW: 13 % (ref 11.5–15.5)
WBC: 19.1 10*3/uL — ABNORMAL HIGH (ref 4.0–10.5)
nRBC: 0 % (ref 0.0–0.2)

## 2020-07-01 MED ORDER — ALPRAZOLAM 0.25 MG PO TABS
0.2500 mg | ORAL_TABLET | Freq: Every evening | ORAL | 0 refills | Status: AC | PRN
Start: 2020-07-01 — End: 2021-07-02

## 2020-07-01 NOTE — Discharge Summary (Signed)
Orthopedic Discharge Summary        Physician Discharge Summary  Patient ID: [name] MRN: [id number] DOB/AGE: 74-01-1946 74 y.o.  Admit date: 06/29/2020 Discharge date: 07/01/2020   Procedures:  Procedure(s) (LRB): TOTAL KNEE ARTHROPLASTY (Right)  Attending Physician:  Dr. Esmond Plants  Admission Diagnoses:   Right knee end stage OA  Discharge Diagnoses:  same   Past Medical History:  Diagnosis Date   Anxiety    Arthritis    Atrial fibrillation [facility])    Documented December 7017   Complication of anesthesia    slow to wake up   Depression    Dysrhythmia    A-fib   GERD (gastroesophageal reflux disease)    Heart murmur    Hyperlipidemia    Hypertension    Hypothyroidism     PCP: [name], MD   Discharged Condition: good  Hospital Course:  Patient underwent the above stated procedure on 06/29/2020. Patient tolerated the procedure well and brought to the recovery room in good condition and subsequently to the floor. Patient had an uncomplicated hospital course and was stable for discharge.   Disposition: Discharge disposition: 01-Home or Self Care      with follow up in 2 weeks    Follow-up Information    [facility] Go on 07/03/2020.   Specialties: Physical Therapy, Occupational Therapy Why: You are scheduled for a physical therapy appointment on 07-03-20 at 11:00 am.  Contact information: [place] [address] [phone]        [name], MD. Call in 2 weeks.   Specialty: Orthopedic Surgery Why: [phone] Contact information: [address] [place] [address] [phone]               Discharge Instructions    Call MD / Call 911   Complete by: As directed    If you experience chest pain or shortness of breath, CALL 911 and be transported to the hospital emergency room.  If you develope a fever above 101 F, pus (white drainage) or increased  drainage or redness at the wound, or calf pain, call your surgeon's office.   Constipation Prevention   Complete by: As directed    Drink plenty of fluids.  Prune juice may be helpful.  You may use a stool softener, such as Colace (over the counter) 100 mg twice a day.  Use MiraLax (over the counter) for constipation as needed.   Diet - low sodium heart healthy   Complete by: As directed    Increase activity slowly as tolerated   Complete by: As directed       Allergies as of 07/01/2020      Reactions   Latex Rash   Penicillins Rash, Other (See Comments)   Has patient had a PCN reaction causing immediate rash, facial/tongue/throat swelling, SOB or lightheadedness with hypotension: #  #  #  YES  #  #  #  Has patient had a PCN reaction causing severe rash involving mucus membranes or skin necrosis: No Has patient had a PCN reaction that required hospitalization Yes Has patient had a PCN reaction occurring within the last 10 years: No If all of the above answers are "NO", then may proceed with Cephalosporin use.   Sulfa Antibiotics Rash   Tramadol Itching      Medication List    TAKE these medications   acetaminophen 500 MG tablet Commonly known as: TYLENOL Take  1,000 mg by mouth every 6 (six) hours as needed (pain.).   ALPRAZolam 0.25 MG tablet Commonly known as: XANAX Take 0.25 mg by mouth 2 (two) times daily as needed for anxiety. What changed: Another medication with the same name was added. Make sure you understand how and when to take each.   ALPRAZolam 0.25 MG tablet Commonly known as: Xanax Take 1 tablet (0.25 mg total) by mouth at bedtime as needed for anxiety. What changed: You were already taking a medication with the same name, and this prescription was added. Make sure you understand how and when to take each.   apixaban 5 MG Tabs tablet Commonly known as: ELIQUIS Take 1 tablet (5 mg total) by mouth 2 (two) times daily.   ascorbic acid 500 MG tablet Commonly  known as: VITAMIN C Take 500 mg by mouth daily.   CALCIUM 1200+D3 PO Take 1 tablet by mouth daily.   citalopram 20 MG tablet Commonly known as: CELEXA Take 20 mg by mouth daily.   diltiazem 240 MG 24 hr capsule Commonly known as: CARDIZEM CD Take 240 mg by mouth daily.   levothyroxine 75 MCG tablet Commonly known as: SYNTHROID Take 75 mcg by mouth daily before breakfast.   Magnesium 250 MG Tabs Take 500 mg by mouth daily.   methocarbamol 500 MG tablet Commonly known as: Robaxin Take 1 tablet (500 mg total) by mouth every 8 (eight) hours as needed for muscle spasms.   multivitamin with minerals tablet Take 1 tablet by mouth daily. One A Day for Women   ondansetron 4 MG tablet Commonly known as: Zofran Take 1 tablet (4 mg total) by mouth every 8 (eight) hours as needed for nausea or vomiting.   PATADAY OP Apply to eye. 1 drop to each eye every morning.   polyethylene glycol 17 g packet Commonly known as: MIRALAX / GLYCOLAX Take 17 g by mouth daily as needed for mild constipation.   Psyllium 63 % Powd Take 4 g by mouth at bedtime.   simvastatin 10 MG tablet Commonly known as: ZOCOR Take 10 mg by mouth daily.   spironolactone 25 MG tablet Commonly known as: ALDACTONE Take 1 tablet (25 mg total) by mouth daily.   Vitamin D3 25 MCG tablet Commonly known as: Vitamin D Take 1,000 Units by mouth daily.         Signed: [name] 07/01/2020, 7:05 AM  [facility] is now [facility] [address] [place], [place], [place] [address] Phone: [place]

## 2020-07-01 NOTE — Progress Notes (Signed)
   07/01/20 1119  PT Visit Information  Last PT Received On 07/01/20  Pt ready to d/c from PT standpoint. Will need assist for mobility at home initially to prevent falls (still requiring min assist for sit<>stand xfers) pt is very cooperative and motivated however with incr pain as she does not tolerate narcotics, continues to have N/V today. R knee AAROM knee flexion ~ 8 to 68 degrees.     Assistance Needed +1  History of Present Illness s/p R TKA. PMH: L TSA, HTN, anxiety  Subjective Data  Patient Stated Goal less knee pain  Precautions  Precautions Fall;Knee  Required Braces or Orthoses Knee Immobilizer - Right  Knee Immobilizer - Right  (sleep in  KI per MD )  Restrictions  Weight Bearing Restrictions No  Other Position/Activity Restrictions WBAT  Pain Assessment  Pain Assessment Faces  Faces Pain Scale 8  Pain Location right knee  Pain Descriptors / Indicators Discomfort;Aching  Pain Intervention(s) Limited activity within patient's tolerance;Monitored during session;Premedicated before session;Ice applied;Repositioned  Cognition  Arousal/Alertness Awake/alert  Behavior During Therapy WFL for tasks assessed/performed  Overall Cognitive Status Within Functional Limits for tasks assessed  Transfers  Overall transfer level Needs assistance  Equipment used Rolling walker (2 wheeled)  Transfers Sit to/from [facility]  Sit to Stand Supervision;Min guard  Stand pivot transfers Min assist;Min guard  General transfer comment cues for anterior-superior wt shift, hand placement   Total Joint Exercises  Knee Flexion AROM;AAROM;Right (7 reps )  PT - End of Session  Equipment Utilized During Treatment Gait belt;Right knee immobilizer  Activity Tolerance Patient tolerated treatment well (limited by nausea)  Patient left with call bell/phone within reach;with family/visitor present;in chair (EOB)   PT - Assessment/Plan  PT Plan Current plan remains appropriate  PT  Visit Diagnosis Difficulty in walking, not elsewhere classified (R26.2)  PT Frequency (ACUTE ONLY) 7X/week  Follow Up Recommendations Follow surgeon's recommendation for DC plan and follow-up therapies  PT equipment None recommended by PT  AM-PAC PT "6 Clicks" Mobility Outcome Measure (Version 2)  Help needed turning from your back to your side while in a flat bed without using bedrails? 3  Help needed moving from lying on your back to sitting on the side of a flat bed without using bedrails? 3  Help needed moving to and from a bed to a chair (including a wheelchair)? 3  Help needed standing up from a chair using your arms (e.g., wheelchair or bedside chair)? 3  Help needed to walk in hospital room? 3  Help needed climbing 3-5 steps with a railing?  2  6 Click Score 17  Consider Recommendation of Discharge To: Home with [facility]  PT Goal Progression  Progress towards PT goals Progressing toward goals  Acute Rehab PT Goals  PT Goal Formulation With patient  Time For Goal Achievement 07/06/20  Potential to Achieve Goals Good  PT Time Calculation  PT Start Time (ACUTE ONLY) 1054  PT Stop Time (ACUTE ONLY) 1108  PT Time Calculation (min) (ACUTE ONLY) 14 min  PT General Charges  $$ ACUTE PT VISIT 1 Visit  PT Treatments  $Therapeutic Activity 8-22 mins

## 2020-07-01 NOTE — Progress Notes (Signed)
Orthopedics Progress Note  Subjective: Patient unable to tolerate the Tylenol #3 due to nausea, wants Tylenol only  Objective:  Vitals:   06/30/20 2155 07/01/20 0533  BP: (!) 165/67 (!) 155/61  Pulse: 64 60  Resp: 16 16  Temp: 98.7 F (37.1 C) 98.6 F (37 C)  SpO2: 94% 97%    General: Awake and alert  Musculoskeletal: Right knee dressing changed to Aquacel. No erythema, incision benign. No calf swelling or tenderness Neurovascularly intact  Lab Results  Component Value Date   WBC 19.1 (H) 07/01/2020   HGB 14.4 07/01/2020   HCT 43.8 07/01/2020   MCV 97.6 07/01/2020   PLT 267 07/01/2020       Component Value Date/Time   NA 136 06/30/2020 0255   K 4.3 06/30/2020 0255   CL 106 06/30/2020 0255   CO2 22 06/30/2020 0255   GLUCOSE 156 (H) 06/30/2020 0255   BUN 12 06/30/2020 0255   CREATININE 0.71 06/30/2020 0255   CREATININE 0.77 05/30/2019 1417   CALCIUM 9.1 06/30/2020 0255   GFRNONAA >60 06/30/2020 0255   GFRAA >60 06/30/2020 0255    No results found for: INR, PROTIME  Assessment/Plan: POD #2 s/p Procedure(s): TOTAL KNEE ARTHROPLASTY Home today on Tylenol and Robaxin for pain PT outpatient on Tuesday  Remo Lipps R. Veverly Fells, MD 07/01/2020 7:01 AM

## 2020-07-01 NOTE — Progress Notes (Signed)
Physical Therapy Treatment Patient Details Name: [name] MRN: [id number] DOB: 1946/02/13 Today's Date: 07/01/2020    History of Present Illness s/p R TKA. PMH: L TSA, HTN, anxiety    PT Comments    Pt progressing although requiring seated  rest after amb and stair training d/t N/V.   Follow Up Recommendations  Follow surgeon's recommendation for DC plan and follow-up therapies     Equipment Recommendations  None recommended by PT    Recommendations for Other Services       Precautions / Restrictions Precautions Precautions: Fall;Knee Required Braces or Orthoses: Knee Immobilizer - Right Knee Immobilizer - Right:  (sleep in  KI per MD ) Restrictions Weight Bearing Restrictions: No RLE Weight Bearing: Weight bearing as tolerated Other Position/Activity Restrictions: WBAT    Mobility  Bed Mobility Overal bed mobility: Needs Assistance       Supine to sit: Supervision     General bed mobility comments: incr time, use of gait belt as leg lifter   Transfers Overall transfer level: Needs assistance Equipment used: Rolling walker (2 wheeled) Transfers: Sit to/from Stand Sit to Stand: Supervision;Min guard         General transfer comment: cues for hand placement; x2 from EOB with and without KI, min assist on second attempt   Ambulation/Gait Ambulation/Gait assistance: Min guard Gait Distance (Feet): 60 Feet Assistive device: Rolling walker (2 wheeled) Gait Pattern/deviations: Decreased stance time - right;Step-to pattern;Decreased step length - right;Decreased step length - left     General Gait Details: cues for sequence, trunk extension and RW position. limited by  N/V   Stairs Stairs: Yes Stairs assistance: Min assist;Min guard Stair Management: One rail Right;One rail Left;Step to pattern (HHA) Number of Stairs: 5 General stair comments: cues for sequence, and technique; up/down with one rail, rail and HHA   Wheelchair Mobility     Modified Rankin (Stroke Patients Only)       Balance                                            Cognition Arousal/Alertness: Awake/alert Behavior During Therapy: WFL for tasks assessed/performed Overall Cognitive Status: Within Functional Limits for tasks assessed                                        Exercises Total Joint Exercises Straight Leg Raises: AROM;Right;10 reps    General Comments        Pertinent Vitals/Pain Pain Assessment: Faces Faces Pain Scale: Hurts whole lot Pain Location: right knee Pain Descriptors / Indicators: Discomfort;Aching Pain Intervention(s): Limited activity within patient's tolerance;Monitored during session;Premedicated before session;Ice applied    Home Living                      Prior Function            PT Goals (current goals can now be found in the care plan section) Acute Rehab PT Goals Patient Stated Goal: less knee pain PT Goal Formulation: With patient Time For Goal Achievement: 07/06/20 Potential to Achieve Goals: Good Progress towards PT goals: Progressing toward goals    Frequency    7X/week      PT Plan Current plan remains appropriate    Co-evaluation  AM-PAC PT "6 Clicks" Mobility   Outcome Measure  Help needed turning from your back to your side while in a flat bed without using bedrails?: A Little Help needed moving from lying on your back to sitting on the side of a flat bed without using bedrails?: A Little Help needed moving to and from a bed to a chair (including a wheelchair)?: A Little Help needed standing up from a chair using your arms (e.g., wheelchair or bedside chair)?: A Little Help needed to walk in hospital room?: A Little Help needed climbing 3-5 steps with a railing? : A Lot 6 Click Score: 17    End of Session Equipment Utilized During Treatment: Gait belt;Right knee immobilizer Activity Tolerance: Patient tolerated  treatment well (limited by nausea) Patient left: in bed;with call bell/phone within reach;with bed alarm set;with family/visitor present (EOB)   PT Visit Diagnosis: Difficulty in walking, not elsewhere classified (R26.2)     Time: 5631-4970 PT Time Calculation (min) (ACUTE ONLY): 24 min  Charges:  $Gait Training: 23-37 mins                     Baxter Flattery, PT  Acute Rehab Dept ([place]) [phone] Pager [phone]  07/01/2020    [facility] 07/01/2020, 11:17 AM

## 2020-07-02 ENCOUNTER — Encounter (HOSPITAL_COMMUNITY): Payer: Self-pay | Admitting: Orthopedic Surgery

## 2020-07-04 DIAGNOSIS — R262 Difficulty in walking, not elsewhere classified: Secondary | ICD-10-CM | POA: Diagnosis not present

## 2020-07-04 DIAGNOSIS — M799 Soft tissue disorder, unspecified: Secondary | ICD-10-CM | POA: Diagnosis not present

## 2020-07-04 DIAGNOSIS — M25661 Stiffness of right knee, not elsewhere classified: Secondary | ICD-10-CM | POA: Diagnosis not present

## 2020-07-04 DIAGNOSIS — M6281 Muscle weakness (generalized): Secondary | ICD-10-CM | POA: Diagnosis not present

## 2020-07-04 DIAGNOSIS — M25561 Pain in right knee: Secondary | ICD-10-CM | POA: Diagnosis not present

## 2020-07-06 DIAGNOSIS — Z09 Encounter for follow-up examination after completed treatment for conditions other than malignant neoplasm: Secondary | ICD-10-CM | POA: Diagnosis not present

## 2020-07-06 DIAGNOSIS — E039 Hypothyroidism, unspecified: Secondary | ICD-10-CM | POA: Diagnosis not present

## 2020-07-06 DIAGNOSIS — M799 Soft tissue disorder, unspecified: Secondary | ICD-10-CM | POA: Diagnosis not present

## 2020-07-06 DIAGNOSIS — M25561 Pain in right knee: Secondary | ICD-10-CM | POA: Diagnosis not present

## 2020-07-06 DIAGNOSIS — I4891 Unspecified atrial fibrillation: Secondary | ICD-10-CM | POA: Diagnosis not present

## 2020-07-06 DIAGNOSIS — M6281 Muscle weakness (generalized): Secondary | ICD-10-CM | POA: Diagnosis not present

## 2020-07-06 DIAGNOSIS — R262 Difficulty in walking, not elsewhere classified: Secondary | ICD-10-CM | POA: Diagnosis not present

## 2020-07-06 DIAGNOSIS — I10 Essential (primary) hypertension: Secondary | ICD-10-CM | POA: Diagnosis not present

## 2020-07-06 DIAGNOSIS — M25661 Stiffness of right knee, not elsewhere classified: Secondary | ICD-10-CM | POA: Diagnosis not present

## 2020-07-09 ENCOUNTER — Other Ambulatory Visit: Payer: Self-pay | Admitting: *Deleted

## 2020-07-09 DIAGNOSIS — M25661 Stiffness of right knee, not elsewhere classified: Secondary | ICD-10-CM | POA: Diagnosis not present

## 2020-07-09 DIAGNOSIS — M799 Soft tissue disorder, unspecified: Secondary | ICD-10-CM | POA: Diagnosis not present

## 2020-07-09 DIAGNOSIS — R262 Difficulty in walking, not elsewhere classified: Secondary | ICD-10-CM | POA: Diagnosis not present

## 2020-07-09 DIAGNOSIS — M6281 Muscle weakness (generalized): Secondary | ICD-10-CM | POA: Diagnosis not present

## 2020-07-09 DIAGNOSIS — M25561 Pain in right knee: Secondary | ICD-10-CM | POA: Diagnosis not present

## 2020-07-09 NOTE — Patient Outreach (Signed)
[facility]) Care Management  07/09/2020  JAYLENNE HAMELIN Jan 24, 1946 [id number]   RED ON EMMI ALERT - General Discharge Day # 4 Date: 8/13 Red Alert Reason: Lost interest in things and Feeling sad/hopeless/anxious/empty   Outreach attempt #1, successful.   Identity verified.  This care manager introduced self and stated purpose of call.  [facility] care management services explained.    Member report she is slowly recovering from her elective knee surgery.  State she normally lives alone but her daughter has been staying with her since discharge for assistance.  She is hoping to improve by the end of the week so that her daughter can go back home.  This care manager inquired about PT and follow up appointments, state she has both scheduled, will have another session of PT today.  Daughter has been providing transportation.  Inquired about meal prep once her daughter is gone, state she will still have her for assistance when needed as well friends that will provide support.  Inquired about the above red alert, state the recording doesn't always accept the correct answer.  Report she is "fine mentally, no issues."  Denies any needs at this time but is receptive to receiving information regarding program.  Will not open case at this time but will send successful outreach letter with this care manager's contact information as well as [facility] brochure.  Encouraged to call this care manager with any questions.    Valente David, [place], MSN [place] Management  Community Care Manager [phone]   Plan: RN CM will

## 2020-07-11 DIAGNOSIS — M799 Soft tissue disorder, unspecified: Secondary | ICD-10-CM | POA: Diagnosis not present

## 2020-07-11 DIAGNOSIS — M25661 Stiffness of right knee, not elsewhere classified: Secondary | ICD-10-CM | POA: Diagnosis not present

## 2020-07-11 DIAGNOSIS — M25561 Pain in right knee: Secondary | ICD-10-CM | POA: Diagnosis not present

## 2020-07-11 DIAGNOSIS — R262 Difficulty in walking, not elsewhere classified: Secondary | ICD-10-CM | POA: Diagnosis not present

## 2020-07-11 DIAGNOSIS — M6281 Muscle weakness (generalized): Secondary | ICD-10-CM | POA: Diagnosis not present

## 2020-07-12 DIAGNOSIS — Z4789 Encounter for other orthopedic aftercare: Secondary | ICD-10-CM | POA: Diagnosis not present

## 2020-07-13 DIAGNOSIS — M25561 Pain in right knee: Secondary | ICD-10-CM | POA: Diagnosis not present

## 2020-07-13 DIAGNOSIS — R262 Difficulty in walking, not elsewhere classified: Secondary | ICD-10-CM | POA: Diagnosis not present

## 2020-07-13 DIAGNOSIS — M6281 Muscle weakness (generalized): Secondary | ICD-10-CM | POA: Diagnosis not present

## 2020-07-13 DIAGNOSIS — M25661 Stiffness of right knee, not elsewhere classified: Secondary | ICD-10-CM | POA: Diagnosis not present

## 2020-07-13 DIAGNOSIS — M799 Soft tissue disorder, unspecified: Secondary | ICD-10-CM | POA: Diagnosis not present

## 2020-07-16 DIAGNOSIS — M25661 Stiffness of right knee, not elsewhere classified: Secondary | ICD-10-CM | POA: Diagnosis not present

## 2020-07-16 DIAGNOSIS — M799 Soft tissue disorder, unspecified: Secondary | ICD-10-CM | POA: Diagnosis not present

## 2020-07-16 DIAGNOSIS — R262 Difficulty in walking, not elsewhere classified: Secondary | ICD-10-CM | POA: Diagnosis not present

## 2020-07-16 DIAGNOSIS — M6281 Muscle weakness (generalized): Secondary | ICD-10-CM | POA: Diagnosis not present

## 2020-07-16 DIAGNOSIS — M25561 Pain in right knee: Secondary | ICD-10-CM | POA: Diagnosis not present

## 2020-07-17 DIAGNOSIS — I4891 Unspecified atrial fibrillation: Secondary | ICD-10-CM | POA: Diagnosis not present

## 2020-07-17 DIAGNOSIS — Z299 Encounter for prophylactic measures, unspecified: Secondary | ICD-10-CM | POA: Diagnosis not present

## 2020-07-17 DIAGNOSIS — B356 Tinea cruris: Secondary | ICD-10-CM | POA: Diagnosis not present

## 2020-07-17 DIAGNOSIS — I10 Essential (primary) hypertension: Secondary | ICD-10-CM | POA: Diagnosis not present

## 2020-07-18 DIAGNOSIS — M6281 Muscle weakness (generalized): Secondary | ICD-10-CM | POA: Diagnosis not present

## 2020-07-18 DIAGNOSIS — M799 Soft tissue disorder, unspecified: Secondary | ICD-10-CM | POA: Diagnosis not present

## 2020-07-18 DIAGNOSIS — M25561 Pain in right knee: Secondary | ICD-10-CM | POA: Diagnosis not present

## 2020-07-18 DIAGNOSIS — R262 Difficulty in walking, not elsewhere classified: Secondary | ICD-10-CM | POA: Diagnosis not present

## 2020-07-18 DIAGNOSIS — M25661 Stiffness of right knee, not elsewhere classified: Secondary | ICD-10-CM | POA: Diagnosis not present

## 2020-07-19 DIAGNOSIS — M799 Soft tissue disorder, unspecified: Secondary | ICD-10-CM | POA: Diagnosis not present

## 2020-07-19 DIAGNOSIS — M25661 Stiffness of right knee, not elsewhere classified: Secondary | ICD-10-CM | POA: Diagnosis not present

## 2020-07-19 DIAGNOSIS — M25561 Pain in right knee: Secondary | ICD-10-CM | POA: Diagnosis not present

## 2020-07-19 DIAGNOSIS — M6281 Muscle weakness (generalized): Secondary | ICD-10-CM | POA: Diagnosis not present

## 2020-07-19 DIAGNOSIS — R262 Difficulty in walking, not elsewhere classified: Secondary | ICD-10-CM | POA: Diagnosis not present

## 2020-07-23 DIAGNOSIS — M6281 Muscle weakness (generalized): Secondary | ICD-10-CM | POA: Diagnosis not present

## 2020-07-23 DIAGNOSIS — M25661 Stiffness of right knee, not elsewhere classified: Secondary | ICD-10-CM | POA: Diagnosis not present

## 2020-07-23 DIAGNOSIS — M799 Soft tissue disorder, unspecified: Secondary | ICD-10-CM | POA: Diagnosis not present

## 2020-07-23 DIAGNOSIS — R262 Difficulty in walking, not elsewhere classified: Secondary | ICD-10-CM | POA: Diagnosis not present

## 2020-07-23 DIAGNOSIS — M25561 Pain in right knee: Secondary | ICD-10-CM | POA: Diagnosis not present

## 2020-07-25 DIAGNOSIS — M799 Soft tissue disorder, unspecified: Secondary | ICD-10-CM | POA: Diagnosis not present

## 2020-07-25 DIAGNOSIS — M25561 Pain in right knee: Secondary | ICD-10-CM | POA: Diagnosis not present

## 2020-07-25 DIAGNOSIS — M6281 Muscle weakness (generalized): Secondary | ICD-10-CM | POA: Diagnosis not present

## 2020-07-25 DIAGNOSIS — R262 Difficulty in walking, not elsewhere classified: Secondary | ICD-10-CM | POA: Diagnosis not present

## 2020-07-25 DIAGNOSIS — M25661 Stiffness of right knee, not elsewhere classified: Secondary | ICD-10-CM | POA: Diagnosis not present

## 2020-07-31 DIAGNOSIS — M25661 Stiffness of right knee, not elsewhere classified: Secondary | ICD-10-CM | POA: Diagnosis not present

## 2020-07-31 DIAGNOSIS — M799 Soft tissue disorder, unspecified: Secondary | ICD-10-CM | POA: Diagnosis not present

## 2020-07-31 DIAGNOSIS — M6281 Muscle weakness (generalized): Secondary | ICD-10-CM | POA: Diagnosis not present

## 2020-07-31 DIAGNOSIS — M25561 Pain in right knee: Secondary | ICD-10-CM | POA: Diagnosis not present

## 2020-07-31 DIAGNOSIS — R262 Difficulty in walking, not elsewhere classified: Secondary | ICD-10-CM | POA: Diagnosis not present

## 2020-08-01 DIAGNOSIS — M25661 Stiffness of right knee, not elsewhere classified: Secondary | ICD-10-CM | POA: Diagnosis not present

## 2020-08-01 DIAGNOSIS — R262 Difficulty in walking, not elsewhere classified: Secondary | ICD-10-CM | POA: Diagnosis not present

## 2020-08-01 DIAGNOSIS — M6281 Muscle weakness (generalized): Secondary | ICD-10-CM | POA: Diagnosis not present

## 2020-08-01 DIAGNOSIS — M25561 Pain in right knee: Secondary | ICD-10-CM | POA: Diagnosis not present

## 2020-08-01 DIAGNOSIS — M799 Soft tissue disorder, unspecified: Secondary | ICD-10-CM | POA: Diagnosis not present

## 2020-08-06 ENCOUNTER — Other Ambulatory Visit: Payer: Self-pay | Admitting: Student

## 2020-08-06 DIAGNOSIS — M6281 Muscle weakness (generalized): Secondary | ICD-10-CM | POA: Diagnosis not present

## 2020-08-06 DIAGNOSIS — M25561 Pain in right knee: Secondary | ICD-10-CM | POA: Diagnosis not present

## 2020-08-06 DIAGNOSIS — M799 Soft tissue disorder, unspecified: Secondary | ICD-10-CM | POA: Diagnosis not present

## 2020-08-06 DIAGNOSIS — R262 Difficulty in walking, not elsewhere classified: Secondary | ICD-10-CM | POA: Diagnosis not present

## 2020-08-06 DIAGNOSIS — M25661 Stiffness of right knee, not elsewhere classified: Secondary | ICD-10-CM | POA: Diagnosis not present

## 2020-08-06 NOTE — Telephone Encounter (Signed)
This is a [place] pt, Dr. McDowell 

## 2020-08-08 DIAGNOSIS — M799 Soft tissue disorder, unspecified: Secondary | ICD-10-CM | POA: Diagnosis not present

## 2020-08-08 DIAGNOSIS — M25661 Stiffness of right knee, not elsewhere classified: Secondary | ICD-10-CM | POA: Diagnosis not present

## 2020-08-08 DIAGNOSIS — R262 Difficulty in walking, not elsewhere classified: Secondary | ICD-10-CM | POA: Diagnosis not present

## 2020-08-08 DIAGNOSIS — M25561 Pain in right knee: Secondary | ICD-10-CM | POA: Diagnosis not present

## 2020-08-08 DIAGNOSIS — M6281 Muscle weakness (generalized): Secondary | ICD-10-CM | POA: Diagnosis not present

## 2020-08-14 DIAGNOSIS — Z4789 Encounter for other orthopedic aftercare: Secondary | ICD-10-CM | POA: Diagnosis not present

## 2020-08-20 DIAGNOSIS — R262 Difficulty in walking, not elsewhere classified: Secondary | ICD-10-CM | POA: Diagnosis not present

## 2020-08-20 DIAGNOSIS — M25561 Pain in right knee: Secondary | ICD-10-CM | POA: Diagnosis not present

## 2020-08-20 DIAGNOSIS — M6281 Muscle weakness (generalized): Secondary | ICD-10-CM | POA: Diagnosis not present

## 2020-08-20 DIAGNOSIS — M25661 Stiffness of right knee, not elsewhere classified: Secondary | ICD-10-CM | POA: Diagnosis not present

## 2020-08-20 DIAGNOSIS — M799 Soft tissue disorder, unspecified: Secondary | ICD-10-CM | POA: Diagnosis not present

## 2020-09-17 ENCOUNTER — Telehealth: Payer: Self-pay | Admitting: Cardiology

## 2020-09-17 MED ORDER — APIXABAN 5 MG PO TABS: 5.0000 mg | ORAL_TABLET | Freq: Two times a day (BID) | ORAL | 0 refills | Status: DC

## 2020-09-17 NOTE — Telephone Encounter (Signed)
Pt will come by office to pick up samples

## 2020-09-17 NOTE — Telephone Encounter (Signed)
Pt is needing samples of apixaban (ELIQUIS) 5 MG TABS tablet [[id number]]  She's in the doughnut hole

## 2020-11-28 DIAGNOSIS — R0789 Other chest pain: Secondary | ICD-10-CM | POA: Diagnosis not present

## 2020-11-28 DIAGNOSIS — I4891 Unspecified atrial fibrillation: Secondary | ICD-10-CM | POA: Diagnosis not present

## 2020-11-28 DIAGNOSIS — I10 Essential (primary) hypertension: Secondary | ICD-10-CM | POA: Diagnosis not present

## 2020-11-28 DIAGNOSIS — Z299 Encounter for prophylactic measures, unspecified: Secondary | ICD-10-CM | POA: Diagnosis not present

## 2020-11-28 DIAGNOSIS — Z789 Other specified health status: Secondary | ICD-10-CM | POA: Diagnosis not present

## 2020-12-26 DIAGNOSIS — Z4789 Encounter for other orthopedic aftercare: Secondary | ICD-10-CM | POA: Diagnosis not present

## 2021-01-03 DIAGNOSIS — Z299 Encounter for prophylactic measures, unspecified: Secondary | ICD-10-CM | POA: Diagnosis not present

## 2021-01-03 DIAGNOSIS — I10 Essential (primary) hypertension: Secondary | ICD-10-CM | POA: Diagnosis not present

## 2021-01-03 DIAGNOSIS — Z7189 Other specified counseling: Secondary | ICD-10-CM | POA: Diagnosis not present

## 2021-01-03 DIAGNOSIS — R5383 Other fatigue: Secondary | ICD-10-CM | POA: Diagnosis not present

## 2021-01-03 DIAGNOSIS — E039 Hypothyroidism, unspecified: Secondary | ICD-10-CM | POA: Diagnosis not present

## 2021-01-03 DIAGNOSIS — Z789 Other specified health status: Secondary | ICD-10-CM | POA: Diagnosis not present

## 2021-01-03 DIAGNOSIS — Z79899 Other long term (current) drug therapy: Secondary | ICD-10-CM | POA: Diagnosis not present

## 2021-01-03 DIAGNOSIS — Z0000 Encounter for general adult medical examination without abnormal findings: Secondary | ICD-10-CM | POA: Diagnosis not present

## 2021-01-03 DIAGNOSIS — E7800 Pure hypercholesterolemia, unspecified: Secondary | ICD-10-CM | POA: Diagnosis not present

## 2021-01-07 ENCOUNTER — Ambulatory Visit: Payer: Medicare Other | Admitting: Cardiology

## 2021-01-07 ENCOUNTER — Encounter: Payer: Self-pay | Admitting: Cardiology

## 2021-01-07 ENCOUNTER — Encounter: Payer: Self-pay | Admitting: *Deleted

## 2021-01-07 VITALS — BP 134/84 | HR 73 | Ht 63.0 in | Wt 180.0 lb

## 2021-01-07 DIAGNOSIS — I10 Essential (primary) hypertension: Secondary | ICD-10-CM | POA: Diagnosis not present

## 2021-01-07 DIAGNOSIS — I480 Paroxysmal atrial fibrillation: Secondary | ICD-10-CM | POA: Diagnosis not present

## 2021-01-07 DIAGNOSIS — H26493 Other secondary cataract, bilateral: Secondary | ICD-10-CM | POA: Diagnosis not present

## 2021-01-07 NOTE — Progress Notes (Signed)
Cardiology Office Note  Date: 01/07/2021   ID: Alberta, Lenhard 1946/02/17, MRN [id number]  PCP:  [name], MD  Cardiologist:  Rozann Lesches, MD Electrophysiologist:  None   Chief Complaint  Patient presents with  . Cardiac follow-up    History of Present Illness: [name] is a 75 y.o. female last seen in July 2021.  She presents for a routine follow-up visit.  She reports an occasional skipped beat, no prolonged palpitations or chest pain.  She had a recent physical with Dr. Woody Seller, we are requesting lab work for review.  I personally reviewed her ECG today which shows sinus rhythm with LVH and nonspecific T wave abnormalities.  I reviewed her cardiac medications which are outlined below.  She continues on both Eliquis and Cardizem CD.  Past Medical History:  Diagnosis Date  . Anxiety   . Arthritis   . Atrial fibrillation [facility])    Documented December 2019  . Depression   . GERD (gastroesophageal reflux disease)   . Heart murmur   . Hyperlipidemia   . Hypertension   . Hypothyroidism     Past Surgical History:  Procedure Laterality Date  . APPENDECTOMY    . BUNIONECTOMY Bilateral   . CATARACT EXTRACTION W/PHACO Right 10/09/2017   Procedure: CATARACT EXTRACTION PHACO AND INTRAOCULAR LENS PLACEMENT (IOC);  Surgeon: [name], MD;  [facility]: [facility];  Service: Ophthalmology;  Laterality: Right;  CDE: 6.65  . CATARACT EXTRACTION W/PHACO Left 12/11/2017   Procedure: CATARACT EXTRACTION PHACO AND INTRAOCULAR LENS PLACEMENT (IOC);  Surgeon: [name], MD;  Location: [facility];  Service: Ophthalmology;  Laterality: Left;  CDE: 5.28  . COLONOSCOPY  06/18/2012   Procedure: COLONOSCOPY;  Surgeon: [name], MD;  [facility];  Service: Endoscopy;  Laterality: N/A;  730  . COLONOSCOPY N/A 07/04/2019   Procedure: COLONOSCOPY;  Surgeon: [name], MD;  [facility];  Service: Endoscopy;  Laterality: N/A;  1220  .  ESOPHAGOGASTRODUODENOSCOPY    . Hammer toe repair Left   . HEMORRHOID SURGERY    . KNEE ARTHROSCOPY     left 06-2019  . SHOULDER ARTHROSCOPY     right  . THYROIDECTOMY    . TONSILLECTOMY    . TOTAL ABDOMINAL HYSTERECTOMY    . TOTAL KNEE ARTHROPLASTY Right 06/29/2020   Procedure: TOTAL KNEE ARTHROPLASTY;  Surgeon: [name], MD;  Location: [facility];  Service: Orthopedics;  Laterality: Right;  adductor canal  . TOTAL SHOULDER ARTHROPLASTY Left 02/06/2017  . TOTAL SHOULDER ARTHROPLASTY Left 02/06/2017   Procedure: TOTAL SHOULDER ARTHROPLASTY;  Surgeon: [name], MD;  Location: [place];  Service: Orthopedics;  Laterality: Left;    Current Outpatient Medications  Medication Sig Dispense Refill  . acetaminophen (TYLENOL) 500 MG tablet Take 1,000 mg by mouth every 6 (six) hours as needed (pain.).     Marland Kitchen ALPRAZolam (XANAX) 0.25 MG tablet Take 1 tablet (0.25 mg total) by mouth at bedtime as needed for anxiety. 30 tablet 0  . apixaban (ELIQUIS) 5 MG TABS tablet Take 1 tablet (5 mg total) by mouth 2 (two) times daily. 28 tablet 0  . ascorbic acid (VITAMIN C) 500 MG tablet Take 500 mg by mouth daily.    . Calcium-Magnesium-Vitamin D (CALCIUM 1200+D3 PO) Take 1 tablet by mouth daily.    . citalopram (CELEXA) 20 MG tablet Take 20 mg by mouth daily.     Marland Kitchen diltiazem (CARDIZEM CD) 240 MG 24 hr  capsule Take 240 mg by mouth daily.    Marland Kitchen levothyroxine (SYNTHROID, LEVOTHROID) 75 MCG tablet Take 75 mcg by mouth daily before breakfast.     . Magnesium 250 MG TABS Take 500 mg by mouth daily.    . Multiple Vitamins-Minerals (MULTIVITAMIN WITH MINERALS) tablet Take 1 tablet by mouth daily. One A Day for Women    . polyethylene glycol (MIRALAX / GLYCOLAX) packet Take 17 g by mouth daily as needed for mild constipation.     . Psyllium 63 % POWD Take 4 g by mouth at bedtime.    . simvastatin (ZOCOR) 10 MG tablet Take 10 mg by mouth daily.    Marland Kitchen spironolactone (ALDACTONE) 25 MG tablet TAKE 1 TABLET BY MOUTH  DAILY 90  tablet 1  . Vitamin D3 (VITAMIN D) 25 MCG tablet Take 1,000 Units by mouth daily.     No current facility-administered medications for this visit.   Allergies:  Latex, Penicillins, Sulfa antibiotics, and Tramadol   ROS: No dizziness or syncope.  Physical Exam: VS:  BP 134/84   Pulse 73   Ht 5\' 3"  (1.6 m)   Wt 180 lb (81.6 kg)   SpO2 97%   BMI 31.89 kg/m , BMI Body mass index is 31.89 kg/m.  Wt Readings from Last 3 Encounters:  01/07/21 180 lb (81.6 kg)  06/29/20 177 lb 4 oz (80.4 kg)  06/20/20 177 lb 4 oz (80.4 kg)    General: Patient appears comfortable at rest. HEENT: Conjunctiva and lids normal, wearing a mask. Neck: Supple, no elevated JVP or carotid bruits, no thyromegaly. Lungs: Clear to auscultation, nonlabored breathing at rest. Cardiac: Regular rate and rhythm, no S3 or significant systolic murmur, no pericardial rub. Extremities: No pitting edema.  ECG:  An ECG dated 11/03/2019 was personally reviewed today and demonstrated:  Sinus rhythm with nonspecific ST-T changes.  Recent Labwork: 06/30/2020: BUN 12; Creatinine, Ser 0.71; Potassium 4.3; Sodium 136 07/01/2020: Hemoglobin 14.4; Platelets 267  February 2021: TSH 1.57, cholesterol 195, triglycerides 119, HDL 60, LDL 114  Other Studies Reviewed Today:  Echocardiogram 11/14/2018: Study Conclusions   - Left ventricle: The cavity size was normal. Wall thickness was  increased in a pattern of moderate LVH. Systolic function was  normal. The estimated ejection fraction was in the range of 55%  to 60%. Wall motion was normal; there were no regional wall  motion abnormalities. Features are consistent with a pseudonormal  left ventricular filling pattern, with concomitant abnormal  relaxation and increased filling pressure (grade 2 diastolic  dysfunction).   Impressions:   - Normal LV systolic function; moderate diastolic dysfunction;  moderate LVH.  Assessment and Plan:  1.  Paroxysmal atrial  fibrillation, CHA2DS2-VASc score is 3.  She continues on Eliquis along with Cardizem CD and is in sinus rhythm today.  Only occasional skipped beat without significant palpitations.  Requesting interval lab work from PCP.  2.  Essential hypertension, systolic 409 today.  She is on Aldactone in addition to Cardizem CD.  Medication Adjustments/Labs and Tests Ordered: Current medicines are reviewed at length with the patient today.  Concerns regarding medicines are outlined above.   Tests Ordered: Orders Placed This Encounter  Procedures  . EKG 12-Lead    Medication Changes: No orders of the defined types were placed in this encounter.   Disposition:  Follow up 6 months in the [place] office.  Signed, Satira Sark, MD, [facility] 01/07/2021 3:50 PM    [facility] at  [address] [place], [place], [place] [address] Phone: [phone]; Fax: [phone]

## 2021-01-07 NOTE — Patient Instructions (Signed)

## 2021-02-21 ENCOUNTER — Other Ambulatory Visit: Payer: Self-pay | Admitting: Internal Medicine

## 2021-02-21 DIAGNOSIS — Z1231 Encounter for screening mammogram for malignant neoplasm of breast: Secondary | ICD-10-CM

## 2021-02-25 ENCOUNTER — Ambulatory Visit
Admission: RE | Admit: 2021-02-25 | Discharge: 2021-02-25 | Disposition: A | Discharge status: Home / Self Care | Payer: Medicare Other | Source: Ambulatory Visit | Attending: Internal Medicine | Admitting: Internal Medicine

## 2021-02-25 ENCOUNTER — Other Ambulatory Visit: Payer: Self-pay

## 2021-02-25 DIAGNOSIS — Z1231 Encounter for screening mammogram for malignant neoplasm of breast: Secondary | ICD-10-CM | POA: Diagnosis not present

## 2021-03-19 ENCOUNTER — Encounter (INDEPENDENT_AMBULATORY_CARE_PROVIDER_SITE_OTHER): Payer: Self-pay | Admitting: Internal Medicine

## 2021-03-19 ENCOUNTER — Ambulatory Visit (INDEPENDENT_AMBULATORY_CARE_PROVIDER_SITE_OTHER): Payer: Medicare Other | Admitting: Internal Medicine

## 2021-03-19 ENCOUNTER — Other Ambulatory Visit: Payer: Self-pay

## 2021-03-19 VITALS — BP 134/75 | HR 69 | Temp 98.4°F | Ht 63.0 in | Wt 178.3 lb

## 2021-03-19 DIAGNOSIS — K921 Melena: Secondary | ICD-10-CM

## 2021-03-19 DIAGNOSIS — K5900 Constipation, unspecified: Secondary | ICD-10-CM | POA: Diagnosis not present

## 2021-03-19 NOTE — Patient Instructions (Signed)
Please follow up with Dr. Evie Lacks for pelvic exam to check for rectocele Take metamucil and Miralax daily

## 2021-03-19 NOTE — Progress Notes (Signed)
Presenting complaint;  Bowel issues.  Database and subjective:  Patient is 75 year old Caucasian female who has history of constipation and hematochezia.  Last colonoscopy was in August 2020 with removal of 7 mm tubular adenoma from ascending colon.  She also had diverticuli at hepatic flexure and sigmoid colon and she also had external hemorrhoids. She was last seen 1 year ago.  She was complaining of hematochezia which I felt was secondary to hemorrhoids.  I recommended Metamucil 4 g daily and polyethylene glycol 8.5 g daily or every other day. She now returns with complaint of stool migrating to vagina.  However she has not experienced any vaginal discharge or bleeding.  She notices blood with a bowel movement if she gets constipated.  She also has had few episodes when she feels like she is going to pass out while she is straining to have a bowel movement.  Her stool is soft to hard.  She also complains of hip pain with straining.  She is taking polyethylene glycol every other day.  She says her appetite is good and her weight is stable.  She has not experienced nausea and vomiting the spells. Patient states the symptoms have been going on for about 6 months.  She is not taking Metamucil anymore.  Her last pelvic exam was 6 months ago.  She had normal CBC 2 months ago.  Current Medications: Outpatient Encounter Medications as of 03/19/2021  Medication Sig  . acetaminophen (TYLENOL) 500 MG tablet Take 1,000 mg by mouth every 6 (six) hours as needed (pain.).   Marland Kitchen ALPRAZolam (XANAX) 0.25 MG tablet Take 1 tablet (0.25 mg total) by mouth at bedtime as needed for anxiety.  Marland Kitchen apixaban (ELIQUIS) 5 MG TABS tablet Take 1 tablet (5 mg total) by mouth 2 (two) times daily.  Marland Kitchen ascorbic acid (VITAMIN C) 500 MG tablet Take 500 mg by mouth daily.  . Calcium-Magnesium-Vitamin D (CALCIUM 1200+D3 PO) Take 1 tablet by mouth daily.  . citalopram (CELEXA) 40 MG tablet Take 40 mg by mouth daily.  Marland Kitchen diltiazem (CARDIZEM  CD) 240 MG 24 hr capsule Take 240 mg by mouth daily.  Marland Kitchen levothyroxine (SYNTHROID, LEVOTHROID) 75 MCG tablet Take 75 mcg by mouth daily before breakfast.   . Magnesium 250 MG TABS Take 500 mg by mouth daily.  . Multiple Vitamins-Minerals (MULTIVITAMIN WITH MINERALS) tablet Take 1 tablet by mouth daily. One A Day for Women  . polyethylene glycol (MIRALAX / GLYCOLAX) packet Take 17 g by mouth daily as needed for mild constipation.   . Psyllium 63 % POWD Take 4 g by mouth at bedtime.  . simvastatin (ZOCOR) 10 MG tablet Take 10 mg by mouth daily.  Marland Kitchen spironolactone (ALDACTONE) 25 MG tablet TAKE 1 TABLET BY MOUTH  DAILY  . Vitamin D3 (VITAMIN D) 25 MCG tablet Take 1,000 Units by mouth daily.   No facility-administered encounter medications on file as of 03/19/2021.   Past Medical History:  Diagnosis Date  . Anxiety   . Arthritis   . Atrial fibrillation [facility])    Documented December 2019  . Depression   . GERD (gastroesophageal reflux disease)   . Heart murmur   . Hyperlipidemia   . Hypertension   . Hypothyroidism         History of colonic adenoma  Past Surgical History:  Procedure Laterality Date  . APPENDECTOMY    . BUNIONECTOMY Bilateral   . CATARACT EXTRACTION W/PHACO Right 10/09/2017   Procedure: CATARACT EXTRACTION PHACO AND INTRAOCULAR LENS  PLACEMENT (IOC);  Surgeon: [name], MD;  [facility];  Service: Ophthalmology;  Laterality: Right;  CDE: 6.65  . CATARACT EXTRACTION W/PHACO Left 12/11/2017   Procedure: CATARACT EXTRACTION PHACO AND INTRAOCULAR LENS PLACEMENT (IOC);  Surgeon: [name], MD;  [facility]: [facility];  Service: Ophthalmology;  Laterality: Left;  CDE: 5.28  . COLONOSCOPY  06/18/2012   Procedure: COLONOSCOPY;  Surgeon: [name], MD;  [facility];  Service: Endoscopy;  Laterality: N/A;  730  . COLONOSCOPY N/A 07/04/2019   Procedure: COLONOSCOPY;  Surgeon: [name], MD;  [facility];  Service: Endoscopy;  Laterality: N/A;   1220  . ESOPHAGOGASTRODUODENOSCOPY    . Hammer toe repair Left   . HEMORRHOID SURGERY    . KNEE ARTHROSCOPY     left 06-2019  . SHOULDER ARTHROSCOPY     right  . THYROIDECTOMY    . TONSILLECTOMY    . TOTAL ABDOMINAL HYSTERECTOMY    . TOTAL KNEE ARTHROPLASTY Right 06/29/2020   Procedure: TOTAL KNEE ARTHROPLASTY;  Surgeon: [name], MD;  [facility];  Service: Orthopedics;  Laterality: Right;  adductor canal  . TOTAL SHOULDER ARTHROPLASTY Left 02/06/2017  . TOTAL SHOULDER ARTHROPLASTY Left 02/06/2017   Procedure: TOTAL SHOULDER ARTHROPLASTY;  Surgeon: [name], MD;  Location: [place];  Service: Orthopedics;  Laterality: Left;       Objective: Blood pressure 134/75, pulse 69, temperature 98.4 F (36.9 C), temperature source Oral, height 5\' 3"  (1.6 m), weight 178 lb 4.8 oz (80.9 kg). Patient is alert and in no acute distress. She is wearing a mask. Conjunctiva is pink. Sclera is nonicteric Oropharyngeal mucosa is normal. No neck masses or thyromegaly noted. He has 2 neck scars. Cardiac exam with regular rhythm normal S1 and S2.  She has faint systolic murmur at aortic area. Lungs are clear to auscultation. Abdomen is symmetrical.  She has lower midline and horizontal hypogastric scars.  On palpation abdomen is soft and nontender with organomegaly or masses. Rectal examination reveals no external abnormality.  Digital exam does not reveal stenosis induration or discomfort.  She has soft stool in the vault and is guaiac negative. No LE edema or clubbing noted.  Labs/studies Results: No recent lab data available for review. Patient reports CBC was normal 2 months ago.  Assessment:  #1.  Change in bowel habits.  Patient has had chronic constipation.  She is not having to strain in order to have bowel movement and he has what appears to be vasovagal symptoms.  She also reports stool moving to vaginal area.  Her symptoms are not suggestive of rectovaginal fistula.  I wonder  if she has developed rectocele.  She needs to go back on Metamucil which may ameliorate her ongoing symptoms.  #2.  Hematochezia felt to be secondary hemorrhoids.  Hemorrhoids were documented on colonoscopy of 07/04/2019.  I believe if hematochezia will improve when she is not constipated.  Plan:  Patient will make an appointment to see Dr. Evie Lacks for pelvic exam to rule out rectocele. Patient advised to take Metamucil 4 g by mouth daily at bedtime and continue polyethylene glycol on every day. Patient will call with progress report after she has had pelvic exam. If pelvic exam is normal and she does not feel any better would consider flexible sigmoidoscopy and anorectal manometry. Office visit date to be determined.

## 2021-03-20 DIAGNOSIS — S93331D Other subluxation of right foot, subsequent encounter: Secondary | ICD-10-CM | POA: Diagnosis not present

## 2021-03-20 DIAGNOSIS — M79675 Pain in left toe(s): Secondary | ICD-10-CM | POA: Diagnosis not present

## 2021-03-20 DIAGNOSIS — M79672 Pain in left foot: Secondary | ICD-10-CM | POA: Diagnosis not present

## 2021-03-26 ENCOUNTER — Emergency Department (HOSPITAL_COMMUNITY)
Admission: EM | Admit: 2021-03-26 | Discharge: 2021-03-26 | Disposition: A | Discharge status: Home / Self Care | Payer: Medicare Other | Attending: Emergency Medicine | Admitting: Emergency Medicine

## 2021-03-26 ENCOUNTER — Other Ambulatory Visit: Payer: Self-pay

## 2021-03-26 ENCOUNTER — Encounter (HOSPITAL_COMMUNITY): Payer: Self-pay | Admitting: Emergency Medicine

## 2021-03-26 ENCOUNTER — Emergency Department (HOSPITAL_COMMUNITY): Payer: Medicare Other

## 2021-03-26 DIAGNOSIS — E039 Hypothyroidism, unspecified: Secondary | ICD-10-CM | POA: Diagnosis not present

## 2021-03-26 DIAGNOSIS — Z96651 Presence of right artificial knee joint: Secondary | ICD-10-CM | POA: Insufficient documentation

## 2021-03-26 DIAGNOSIS — I10 Essential (primary) hypertension: Secondary | ICD-10-CM | POA: Diagnosis not present

## 2021-03-26 DIAGNOSIS — W06XXXA Fall from bed, initial encounter: Secondary | ICD-10-CM | POA: Insufficient documentation

## 2021-03-26 DIAGNOSIS — S42201A Unspecified fracture of upper end of right humerus, initial encounter for closed fracture: Secondary | ICD-10-CM | POA: Insufficient documentation

## 2021-03-26 DIAGNOSIS — Z743 Need for continuous supervision: Secondary | ICD-10-CM | POA: Diagnosis not present

## 2021-03-26 DIAGNOSIS — Z79899 Other long term (current) drug therapy: Secondary | ICD-10-CM | POA: Diagnosis not present

## 2021-03-26 DIAGNOSIS — Z91040 Latex allergy status: Secondary | ICD-10-CM | POA: Insufficient documentation

## 2021-03-26 DIAGNOSIS — Z7901 Long term (current) use of anticoagulants: Secondary | ICD-10-CM | POA: Insufficient documentation

## 2021-03-26 DIAGNOSIS — S4991XA Unspecified injury of right shoulder and upper arm, initial encounter: Secondary | ICD-10-CM | POA: Diagnosis present

## 2021-03-26 DIAGNOSIS — Y92009 Unspecified place in unspecified non-institutional (private) residence as the place of occurrence of the external cause: Secondary | ICD-10-CM | POA: Diagnosis not present

## 2021-03-26 DIAGNOSIS — R6889 Other general symptoms and signs: Secondary | ICD-10-CM | POA: Diagnosis not present

## 2021-03-26 DIAGNOSIS — S42291A Other displaced fracture of upper end of right humerus, initial encounter for closed fracture: Secondary | ICD-10-CM | POA: Diagnosis not present

## 2021-03-26 DIAGNOSIS — Z96612 Presence of left artificial shoulder joint: Secondary | ICD-10-CM | POA: Insufficient documentation

## 2021-03-26 DIAGNOSIS — R404 Transient alteration of awareness: Secondary | ICD-10-CM | POA: Diagnosis not present

## 2021-03-26 DIAGNOSIS — M7989 Other specified soft tissue disorders: Secondary | ICD-10-CM | POA: Diagnosis not present

## 2021-03-26 DIAGNOSIS — M79603 Pain in arm, unspecified: Secondary | ICD-10-CM | POA: Diagnosis not present

## 2021-03-26 DIAGNOSIS — I480 Paroxysmal atrial fibrillation: Secondary | ICD-10-CM | POA: Diagnosis not present

## 2021-03-26 DIAGNOSIS — M958 Other specified acquired deformities of musculoskeletal system: Secondary | ICD-10-CM | POA: Diagnosis not present

## 2021-03-26 DIAGNOSIS — S42411A Displaced simple supracondylar fracture without intercondylar fracture of right humerus, initial encounter for closed fracture: Secondary | ICD-10-CM | POA: Diagnosis not present

## 2021-03-26 MED ORDER — ACETAMINOPHEN 500 MG PO TABS
1000.0000 mg | ORAL_TABLET | Freq: Once | ORAL | Status: AC
  Administered 2021-03-26: 1000 mg via ORAL
  Filled 2021-03-26: qty 2

## 2021-03-26 MED ORDER — KETOROLAC TROMETHAMINE 30 MG/ML IJ SOLN: 30.0000 mg | Freq: Once | INTRAMUSCULAR | Status: DC

## 2021-03-26 NOTE — ED Triage Notes (Signed)
Pt fell while turning around at home. Pt c/o right upper arm pain.

## 2021-03-26 NOTE — ED Provider Notes (Signed)
[facility] EMERGENCY DEPARTMENT Provider Note   CSN: [id number] Arrival date & time: 03/26/21  0159     History Chief Complaint  Patient presents with  . Fall    [name] is a 75 y.o. female.  Patient is a 75 year old female with past medical history of hypertension, arthritis, hyperlipidemia, and paroxysmal atrial fibrillation on Eliquis.  Patient presents today for evaluation of fall.  Patient got out of bed this evening, then lost her balance and fell.  She injured her right shoulder in the fall.  She denies having struck her head or having experienced additional injury.  She denies any numbness or tingling.  Wrist pain is worse when she moves her arm and relieved somewhat with remaining still.  The history is provided by the patient.       Past Medical History:  Diagnosis Date  . Anxiety   . Arthritis   . Atrial fibrillation [facility])    Documented December 2019  . Depression   . GERD (gastroesophageal reflux disease)   . Heart murmur   . Hyperlipidemia   . Hypertension   . Hypothyroidism     Patient Active Problem List   Diagnosis Date Noted  . Status post total knee replacement, right 06/29/2020  . History of colonic polyps 09/12/2019  . Loss of weight 06/27/2019  . Rectal bleeding 05/30/2019  . Atrial fibrillation with RVR ([place])   . New onset a-fib ([place]) w rvr 11/13/2018  . Hypokalemia 11/13/2018  . Constipation 02/10/2017  . Status post total shoulder arthroplasty, left 02/06/2017  . Elevated transaminase level 01/25/2013  . GERD (gastroesophageal reflux disease) 01/25/2013  . Hematochezia 06/15/2012  . Hypertension 06/15/2012  . High cholesterol 06/15/2012    Past Surgical History:  Procedure Laterality Date  . APPENDECTOMY    . BUNIONECTOMY Bilateral   . CATARACT EXTRACTION W/PHACO Right 10/09/2017   Procedure: CATARACT EXTRACTION PHACO AND INTRAOCULAR LENS PLACEMENT (IOC);  Surgeon: [name], MD;  Location: [facility];  Service:  Ophthalmology;  Laterality: Right;  CDE: 6.65  . CATARACT EXTRACTION W/PHACO Left 12/11/2017   Procedure: CATARACT EXTRACTION PHACO AND INTRAOCULAR LENS PLACEMENT (IOC);  Surgeon: [name], MD;  Location: [facility];  Service: Ophthalmology;  Laterality: Left;  CDE: 5.28  . COLONOSCOPY  06/18/2012   Procedure: COLONOSCOPY;  Surgeon: [name], MD;  [facility];  Service: Endoscopy;  Laterality: N/A;  730  . COLONOSCOPY N/A 07/04/2019   Procedure: COLONOSCOPY;  Surgeon: [name], MD;  [facility];  Service: Endoscopy;  Laterality: N/A;  1220  . ESOPHAGOGASTRODUODENOSCOPY    . Hammer toe repair Left   . HEMORRHOID SURGERY    . KNEE ARTHROSCOPY     left 06-2019  . SHOULDER ARTHROSCOPY     right  . THYROIDECTOMY    . TONSILLECTOMY    . TOTAL ABDOMINAL HYSTERECTOMY    . TOTAL KNEE ARTHROPLASTY Right 06/29/2020   Procedure: TOTAL KNEE ARTHROPLASTY;  Surgeon: [name], MD;  Location: [facility];  Service: Orthopedics;  Laterality: Right;  adductor canal  . TOTAL SHOULDER ARTHROPLASTY Left 02/06/2017  . TOTAL SHOULDER ARTHROPLASTY Left 02/06/2017   Procedure: TOTAL SHOULDER ARTHROPLASTY;  Surgeon: [name], MD;  Location: [place];  Service: Orthopedics;  Laterality: Left;     OB History   No obstetric history on file.     Family History  Problem Relation Age of Onset  . Atrial fibrillation Sister   . Colon cancer Neg Hx  Social History   Tobacco Use  . Smoking status: Never Smoker  . Smokeless tobacco: Never Used  Vaping Use  . Vaping Use: Never used  Substance Use Topics  . Alcohol use: No    Alcohol/week: 0.0 standard drinks  . Drug use: No    Home Medications Prior to Admission medications   Medication Sig Start Date End Date Taking? Authorizing Provider  acetaminophen (TYLENOL) 500 MG tablet Take 1,000 mg by mouth every 6 (six) hours as needed (pain.).     [provider]  ALPRAZolam Duanne Moron) 0.25 MG tablet Take 1 tablet  (0.25 mg total) by mouth at bedtime as needed for anxiety. 07/01/20 07/01/21  [name], MD  apixaban (ELIQUIS) 5 MG TABS tablet Take 1 tablet (5 mg total) by mouth 2 (two) times daily. 09/17/20   Satira Sark, MD  ascorbic acid (VITAMIN C) 500 MG tablet Take 500 mg by mouth daily.    [provider]  Calcium-Magnesium-Vitamin D (CALCIUM 1200+D3 PO) Take 1 tablet by mouth daily.    [provider]  citalopram (CELEXA) 40 MG tablet Take 40 mg by mouth daily.    [provider]  diltiazem (CARDIZEM CD) 240 MG 24 hr capsule Take 240 mg by mouth daily.    [provider]  levothyroxine (SYNTHROID, LEVOTHROID) 75 MCG tablet Take 75 mcg by mouth daily before breakfast.     [provider]  Magnesium 250 MG TABS Take 500 mg by mouth daily.    [provider]  Multiple Vitamins-Minerals (MULTIVITAMIN WITH MINERALS) tablet Take 1 tablet by mouth daily. One A Day for Women    [provider]  polyethylene glycol (MIRALAX / GLYCOLAX) packet Take 17 g by mouth daily as needed for mild constipation.     [provider]  Psyllium 63 % POWD Take 4 g by mouth at bedtime. 03/13/20   Rehman, Mechele Dawley, MD  simvastatin (ZOCOR) 10 MG tablet Take 10 mg by mouth daily.    [provider]  spironolactone (ALDACTONE) 25 MG tablet TAKE 1 TABLET BY MOUTH  DAILY 08/06/20   Satira Sark, MD  Vitamin D3 (VITAMIN D) 25 MCG tablet Take 1,000 Units by mouth daily.    [provider]    Allergies    Latex, Penicillins, Sulfa antibiotics, and Tramadol  Review of Systems   Review of Systems  All other systems reviewed and are negative.   Physical Exam Updated Vital Signs BP (!) 135/107   Pulse 74   Temp 99.2 F (37.3 C) (Oral)   Resp 17   Ht 5\' 3"  (1.6 m)   Wt 81 kg   SpO2 94%   BMI 31.63 kg/m   Physical Exam Vitals and nursing note reviewed.  Constitutional:      General: She is not in acute distress.     Appearance: She is well-developed. She is not diaphoretic.  HENT:     Head: Normocephalic and atraumatic.  Eyes:     Extraocular Movements: Extraocular movements intact.     Pupils: Pupils are equal, round, and reactive to light.  Cardiovascular:     Rate and Rhythm: Normal rate and regular rhythm.     Heart sounds: No murmur heard. No friction rub. No gallop.   Pulmonary:     Effort: Pulmonary effort is normal. No respiratory distress.     Breath sounds: Normal breath sounds. No wheezing.  Abdominal:     General: Bowel sounds are normal. There  is no distension.     Palpations: Abdomen is soft.     Tenderness: There is no abdominal tenderness.  Musculoskeletal:        General: Normal range of motion.     Cervical back: Normal range of motion and neck supple.     Comments: Pelvis is stable and patient has good range of motion of both hips without discomfort.  PMS is intact to all extremities.  There is an apparent deformity of the right shoulder.  She has pain with any range of motion.  Ulnar and radial pulses are easily palpable and motor and sensation are intact throughout the entire hand.  Skin:    General: Skin is warm and dry.  Neurological:     General: No focal deficit present.     Mental Status: She is alert and oriented to person, place, and time.     Cranial Nerves: No cranial nerve deficit.     Sensory: No sensory deficit.     Motor: No weakness.     Coordination: Coordination normal.     ED Results / Procedures / Treatments   Labs (all labs ordered are listed, but only abnormal results are displayed) Labs Reviewed - No data to display  EKG None  Radiology No results found.  Procedures Procedures   Medications Ordered in ED Medications - No data to display  ED Course  I have reviewed the triage vital signs and the nursing notes.  Pertinent labs & imaging results that were available during my care of the patient were reviewed by me and considered in my  medical decision making (see chart for details).    MDM Rules/Calculators/A&P  Patient presenting with complaints of right shoulder pain after a fall at home.  X-rays show a proximal humerus fracture.  Patient is neurovascularly intact distal to the injury.  She will be placed in a sling and swath and is to follow-up with orthopedics.  She has seen Dr. Alma Friendly in the past and would like to see him for this injury.  Patient also takes Eliquis, but denies having struck her head or loss consciousness.  She has no other complaints and I do not feel as though other imaging studies are indicated.  Final Clinical Impression(s) / ED Diagnoses Final diagnoses:  None    Rx / DC Orders ED Discharge Orders    None       Veryl Speak, MD 03/26/21 [phone]

## 2021-03-26 NOTE — Discharge Instructions (Addendum)
Wear sling and swath until followed up by orthopedics.  Call Dr. Veverly Fells this morning to make arrangements for a follow-up appointment in 1 week.  Take Tylenol 1000 mg every 6 hours as needed for pain.  Ice for 20 minutes every 2 hours while awake for the next 2 days.

## 2021-04-02 DIAGNOSIS — Z299 Encounter for prophylactic measures, unspecified: Secondary | ICD-10-CM | POA: Diagnosis not present

## 2021-04-02 DIAGNOSIS — G4700 Insomnia, unspecified: Secondary | ICD-10-CM | POA: Diagnosis not present

## 2021-04-02 DIAGNOSIS — S42001A Fracture of unspecified part of right clavicle, initial encounter for closed fracture: Secondary | ICD-10-CM | POA: Diagnosis not present

## 2021-04-02 DIAGNOSIS — I10 Essential (primary) hypertension: Secondary | ICD-10-CM | POA: Diagnosis not present

## 2021-04-03 DIAGNOSIS — M25511 Pain in right shoulder: Secondary | ICD-10-CM | POA: Diagnosis not present

## 2021-04-18 DIAGNOSIS — M25511 Pain in right shoulder: Secondary | ICD-10-CM | POA: Diagnosis not present

## 2021-05-02 DIAGNOSIS — M6281 Muscle weakness (generalized): Secondary | ICD-10-CM | POA: Diagnosis not present

## 2021-05-02 DIAGNOSIS — M799 Soft tissue disorder, unspecified: Secondary | ICD-10-CM | POA: Diagnosis not present

## 2021-05-02 DIAGNOSIS — M25611 Stiffness of right shoulder, not elsewhere classified: Secondary | ICD-10-CM | POA: Diagnosis not present

## 2021-05-02 DIAGNOSIS — M25511 Pain in right shoulder: Secondary | ICD-10-CM | POA: Diagnosis not present

## 2021-05-06 DIAGNOSIS — M6281 Muscle weakness (generalized): Secondary | ICD-10-CM | POA: Diagnosis not present

## 2021-05-06 DIAGNOSIS — M25611 Stiffness of right shoulder, not elsewhere classified: Secondary | ICD-10-CM | POA: Diagnosis not present

## 2021-05-06 DIAGNOSIS — M799 Soft tissue disorder, unspecified: Secondary | ICD-10-CM | POA: Diagnosis not present

## 2021-05-06 DIAGNOSIS — M25511 Pain in right shoulder: Secondary | ICD-10-CM | POA: Diagnosis not present

## 2021-05-07 DIAGNOSIS — M799 Soft tissue disorder, unspecified: Secondary | ICD-10-CM | POA: Diagnosis not present

## 2021-05-07 DIAGNOSIS — M6281 Muscle weakness (generalized): Secondary | ICD-10-CM | POA: Diagnosis not present

## 2021-05-07 DIAGNOSIS — M25611 Stiffness of right shoulder, not elsewhere classified: Secondary | ICD-10-CM | POA: Diagnosis not present

## 2021-05-07 DIAGNOSIS — M25511 Pain in right shoulder: Secondary | ICD-10-CM | POA: Diagnosis not present

## 2021-05-08 DIAGNOSIS — M25511 Pain in right shoulder: Secondary | ICD-10-CM | POA: Diagnosis not present

## 2021-05-08 DIAGNOSIS — M6281 Muscle weakness (generalized): Secondary | ICD-10-CM | POA: Diagnosis not present

## 2021-05-08 DIAGNOSIS — M25611 Stiffness of right shoulder, not elsewhere classified: Secondary | ICD-10-CM | POA: Diagnosis not present

## 2021-05-08 DIAGNOSIS — M799 Soft tissue disorder, unspecified: Secondary | ICD-10-CM | POA: Diagnosis not present

## 2021-05-13 DIAGNOSIS — M25511 Pain in right shoulder: Secondary | ICD-10-CM | POA: Diagnosis not present

## 2021-05-13 DIAGNOSIS — M25611 Stiffness of right shoulder, not elsewhere classified: Secondary | ICD-10-CM | POA: Diagnosis not present

## 2021-05-13 DIAGNOSIS — M6281 Muscle weakness (generalized): Secondary | ICD-10-CM | POA: Diagnosis not present

## 2021-05-13 DIAGNOSIS — M799 Soft tissue disorder, unspecified: Secondary | ICD-10-CM | POA: Diagnosis not present

## 2021-05-15 DIAGNOSIS — M6281 Muscle weakness (generalized): Secondary | ICD-10-CM | POA: Diagnosis not present

## 2021-05-15 DIAGNOSIS — M799 Soft tissue disorder, unspecified: Secondary | ICD-10-CM | POA: Diagnosis not present

## 2021-05-15 DIAGNOSIS — M25511 Pain in right shoulder: Secondary | ICD-10-CM | POA: Diagnosis not present

## 2021-05-15 DIAGNOSIS — M25611 Stiffness of right shoulder, not elsewhere classified: Secondary | ICD-10-CM | POA: Diagnosis not present

## 2021-05-16 DIAGNOSIS — M79644 Pain in right finger(s): Secondary | ICD-10-CM | POA: Diagnosis not present

## 2021-05-16 DIAGNOSIS — S42231P 3-part fracture of surgical neck of right humerus, subsequent encounter for fracture with malunion: Secondary | ICD-10-CM | POA: Diagnosis not present

## 2021-05-16 DIAGNOSIS — S4290XA Fracture of unspecified shoulder girdle, part unspecified, initial encounter for closed fracture: Secondary | ICD-10-CM | POA: Diagnosis not present

## 2021-05-24 DIAGNOSIS — M13841 Other specified arthritis, right hand: Secondary | ICD-10-CM | POA: Diagnosis not present

## 2021-06-11 DIAGNOSIS — S42201A Unspecified fracture of upper end of right humerus, initial encounter for closed fracture: Secondary | ICD-10-CM | POA: Diagnosis not present

## 2021-06-17 DIAGNOSIS — Z299 Encounter for prophylactic measures, unspecified: Secondary | ICD-10-CM | POA: Diagnosis not present

## 2021-06-17 DIAGNOSIS — I4891 Unspecified atrial fibrillation: Secondary | ICD-10-CM | POA: Diagnosis not present

## 2021-06-17 DIAGNOSIS — I10 Essential (primary) hypertension: Secondary | ICD-10-CM | POA: Diagnosis not present

## 2021-06-17 DIAGNOSIS — D6869 Other thrombophilia: Secondary | ICD-10-CM | POA: Diagnosis not present

## 2021-06-26 NOTE — Progress Notes (Signed)
Sent message, via epic in basket, requesting orders in epic from surgeon.  

## 2021-06-27 NOTE — H&P (Signed)
Patient's anticipated LOS is less than 2 midnights, meeting these requirements: - Younger than 76 - Lives within 1 hour of care - Has a competent adult at home to recover with post-op recover - NO history of  - Chronic pain requiring opiods  - Diabetes  - Coronary Artery Disease  - Heart failure  - Heart attack  - Stroke  - DVT/VTE  - Cardiac arrhythmia  - Respiratory Failure/COPD  - Renal failure  - Anemia  - Advanced Liver disease     [name] is an 75 y.o. female.    Chief Complaint: right shoulder pain  HPI: Pt is a 75 y.o. female complaining of right shoulder pain for multiple years. Pain had continually increased since the beginning. X-rays in the clinic show end-stage arthritic changes of the right shoulder. Pt has tried various conservative treatments which have failed to alleviate their symptoms, including injections and therapy. Various options are discussed with the patient. Risks, benefits and expectations were discussed with the patient. Patient understand the risks, benefits and expectations and wishes to proceed with surgery.   PCP:  [name], MD  D/C Plans: Home  PMH: Past Medical History:  Diagnosis Date   Anxiety    Arthritis    Atrial fibrillation [facility])    Documented December 2019   Depression    GERD (gastroesophageal reflux disease)    Heart murmur    Hyperlipidemia    Hypertension    Hypothyroidism     PSH: Past Surgical History:  Procedure Laterality Date   APPENDECTOMY     BUNIONECTOMY Bilateral    CATARACT EXTRACTION W/PHACO Right 10/09/2017   Procedure: CATARACT EXTRACTION PHACO AND INTRAOCULAR LENS PLACEMENT ([place]);  Surgeon: [name], MD;  Location: [facility];  Service: Ophthalmology;  Laterality: Right;  CDE: 6.65   CATARACT EXTRACTION W/PHACO Left 12/11/2017   Procedure: CATARACT EXTRACTION PHACO AND INTRAOCULAR LENS PLACEMENT (IOC);  Surgeon: [name], MD;  Location: [facility];  Service: Ophthalmology;  Laterality:  Left;  CDE: 5.28   COLONOSCOPY  06/18/2012   Procedure: COLONOSCOPY;  Surgeon: [name], MD;  [facility];  Service: Endoscopy;  Laterality: N/A;  730   COLONOSCOPY N/A 07/04/2019   Procedure: COLONOSCOPY;  Surgeon: [name], MD;  [facility];  Service: Endoscopy;  Laterality: N/A;  1220   ESOPHAGOGASTRODUODENOSCOPY     Hammer toe repair Left    HEMORRHOID SURGERY     KNEE ARTHROSCOPY     left 06-2019   SHOULDER ARTHROSCOPY     right   THYROIDECTOMY     TONSILLECTOMY     TOTAL ABDOMINAL HYSTERECTOMY     TOTAL KNEE ARTHROPLASTY Right 06/29/2020   Procedure: TOTAL KNEE ARTHROPLASTY;  Surgeon: [name], MD;  [facility];  Service: Orthopedics;  Laterality: Right;  adductor canal   TOTAL SHOULDER ARTHROPLASTY Left 02/06/2017   TOTAL SHOULDER ARTHROPLASTY Left 02/06/2017   Procedure: TOTAL SHOULDER ARTHROPLASTY;  Surgeon: [name], MD;  Location: [place];  Service: Orthopedics;  Laterality: Left;    Social History:  reports that she has never smoked. She has never used smokeless tobacco. She reports that she does not drink alcohol and does not use drugs.  Allergies:  Allergies  Allergen Reactions   Latex Rash   Penicillins Rash and Other (See Comments)    Has patient had a PCN reaction causing immediate rash, facial/tongue/throat swelling, SOB or lightheadedness with hypotension: #  #  #  YES  #  #  #  Has patient had a PCN reaction causing severe rash involving mucus membranes or skin necrosis: No Has patient had a PCN reaction that required hospitalization Yes Has patient had a PCN reaction occurring within the last 10 years: No If all of the above answers are "NO", then may proceed with Cephalosporin use.    Sulfa Antibiotics Rash   Tramadol Itching    Medications: No current facility-administered medications for this encounter.   Current Outpatient Medications  Medication Sig Dispense Refill   acetaminophen (TYLENOL) 500 MG  tablet Take 1,000 mg by mouth every 6 (six) hours as needed (pain.).      ALPRAZolam (XANAX) 0.25 MG tablet Take 1 tablet (0.25 mg total) by mouth at bedtime as needed for anxiety. 30 tablet 0   apixaban (ELIQUIS) 5 MG TABS tablet Take 1 tablet (5 mg total) by mouth 2 (two) times daily. 28 tablet 0   ascorbic acid (VITAMIN C) 500 MG tablet Take 500 mg by mouth daily.     Calcium-Magnesium-Vitamin D (CALCIUM 1200+D3 PO) Take 1 tablet by mouth daily.     citalopram (CELEXA) 40 MG tablet Take 40 mg by mouth daily.     diltiazem (CARDIZEM CD) 240 MG 24 hr capsule Take 240 mg by mouth daily.     levothyroxine (SYNTHROID, LEVOTHROID) 75 MCG tablet Take 75 mcg by mouth daily before breakfast.      Magnesium 250 MG TABS Take 500 mg by mouth daily.     Multiple Vitamins-Minerals (MULTIVITAMIN WITH MINERALS) tablet Take 1 tablet by mouth daily. One A Day for Women     polyethylene glycol (MIRALAX / GLYCOLAX) packet Take 17 g by mouth daily as needed for mild constipation.      Psyllium 63 % POWD Take 4 g by mouth at bedtime.     simvastatin (ZOCOR) 10 MG tablet Take 10 mg by mouth daily.     spironolactone (ALDACTONE) 25 MG tablet TAKE 1 TABLET BY MOUTH  DAILY 90 tablet 1   Vitamin D3 (VITAMIN D) 25 MCG tablet Take 1,000 Units by mouth daily.      No results found for this or any previous visit (from the past 48 hour(s)). No results found.  ROS: Pain with rom of the right upper extremity  Physical Exam: Alert and oriented 75 y.o. female in no acute distress Cranial nerves 2-12 intact Cervical spine: full rom with no tenderness, nv intact distally Chest: active breath sounds bilaterally, no wheeze rhonchi or rales Heart: regular rate and rhythm, no murmur Abd: non tender non distended with active bowel sounds Hip is stable with rom  Right shoulder with painful and weak rom Nv intact distally No rashes or edema distally  Assessment/Plan Assessment: right shoulder cuff  arthropathy  Plan:  Patient will undergo a right reverse total shoulder by Dr. Veverly Fells at [place] Risks benefits and expectations were discussed with the patient. Patient understand risks, benefits and expectations and wishes to proceed. Preoperative templating of the joint replacement has been completed, documented, and submitted to the Operating Room personnel in order to optimize intra-operative equipment management.   Merla Riches PA-C, MPAS [facility] is now [employer] [address], [place], [place], [place] [address] Phone: [phone] [email or web address]  [employer]

## 2021-07-02 NOTE — Patient Instructions (Addendum)
DUE TO COVID-19 ONLY ONE VISITOR IS ALLOWED TO COME WITH YOU AND STAY IN THE WAITING ROOM ONLY DURING PRE OP AND PROCEDURE.   **NO VISITORS ARE ALLOWED IN THE SHORT STAY AREA OR RECOVERY ROOM!!**  IF YOU WILL BE ADMITTED INTO THE HOSPITAL YOU ARE ALLOWED ONLY TWO SUPPORT PEOPLE DURING VISITATION HOURS ONLY (10AM -8PM)   The support person(s) may change daily. The support person(s) must pass our screening, gel in and out, and wear a mask at all times, including in the patient's room. Patients must also wear a mask when staff or their support person are in the room.  No visitors under the age of 60. Any visitor under the age of 37 must be accompanied by an adult.    Your procedure is scheduled on: 07/12/21   Report to [facility] Main  Entrance    Report to admitting at 6:30 AM   Call this number if you have problems the morning of surgery [phone]   Do not eat food :After Midnight.   May have liquids until  7:00 AM  day of surgery  CLEAR LIQUID DIET  Foods Allowed                                                                     Foods Excluded  Water, Black Coffee and tea, regular and decaf               liquids that you cannot  Plain Jell-O in any flavor  (No red)                                     see through such as: Fruit ices (not with fruit pulp)                                            milk, soups, orange juice              Iced Popsicles (No red)                                               All solid food                                   Apple juices Sports drinks like Gatorade (No red) Lightly seasoned clear broth or consume(fat free) Sugar, honey syrup     The day of surgery:  Drink ONE (1) Pre-Surgery Clear Ensure by 7:00 am the morning of surgery. Drink in one sitting. Do not sip.  This drink was given to you during your hospital  pre-op appointment visit. Nothing else to drink after completing the  Pre-Surgery Clear Ensure.          If you  have questions, please contact your surgeon's office.     Oral Hygiene is also important to reduce your  risk of infection.                                    Remember - BRUSH YOUR TEETH THE MORNING OF SURGERY WITH YOUR REGULAR TOOTHPASTE   Take these medicines the morning of surgery with A SIP OF WATER: Tylenol, Celexa, Diltiazem, Synthroid, Simvastatin.   Follow instructions given to you regarding when to stop taking Eliquis before surgery.                              You may not have any metal on your body including hair pins, jewelry, and body piercing             Do not wear make-up, lotions, powders, perfumes, or deodorant  Do not wear nail polish including gel and S&S, artificial/acrylic nails, or any other type of covering on natural nails including finger and toenails. If you have artificial nails, gel coating, etc. that needs to be removed by a nail salon please have this removed prior to surgery or surgery may need to be canceled/ delayed if the surgeon/ anesthesia feels like they are unable to be safely monitored.   Do not shave  48 hours prior to surgery.               Do not bring valuables to the hospital. [place].   Bring small overnight bag day of surgery.   Special Instructions: Bring a copy of your healthcare power of attorney and living will documents         the day of surgery if you haven't scanned them in before.  Please read over the following fact sheets you were given: IF YOU HAVE QUESTIONS ABOUT YOUR PRE OP INSTRUCTIONS PLEASE CALL [phone]- [place] - Preparing for Surgery Before surgery, you can play an important role.  Because skin is not sterile, your skin needs to be as free of germs as possible.  You can reduce the number of germs on your skin by washing with CHG (chlorahexidine gluconate) soap before surgery.  CHG is an antiseptic cleaner which kills germs and bonds with the skin to  continue killing germs even after washing. Please DO NOT use if you have an allergy to CHG or antibacterial soaps.  If your skin becomes reddened/irritated stop using the CHG and inform your nurse when you arrive at Short Stay. Do not shave (including legs and underarms) for at least 48 hours prior to the first CHG shower.  You may shave your face/neck.  Please follow these instructions carefully:  1.  Shower with CHG Soap the night before surgery and the  morning of surgery.  2.  If you choose to wash your hair, wash your hair first as usual with your normal  shampoo.  3.  After you shampoo, rinse your hair and body thoroughly to remove the shampoo.                             4.  Use CHG as you would any other liquid soap.  You can apply chg directly to the skin and wash.  Gently with a scrungie or clean washcloth.  5.  Apply the CHG  Soap to your body ONLY FROM THE NECK DOWN.   Do   not use on face/ open                           Wound or open sores. Avoid contact with eyes, ears mouth and   genitals (private parts).                       Wash face,  Genitals (private parts) with your normal soap.             6.  Wash thoroughly, paying special attention to the area where your    surgery  will be performed.  7.  Thoroughly rinse your body with warm water from the neck down.  8.  DO NOT shower/wash with your normal soap after using and rinsing off the CHG Soap.                9.  Pat yourself dry with a clean towel.            10.  Wear clean pajamas.            11.  Place clean sheets on your bed the night of your first shower and do not  sleep with pets. Day of Surgery : Do not apply any lotions/deodorants the morning of surgery.  Please wear clean clothes to the hospital/surgery center.  FAILURE TO FOLLOW THESE INSTRUCTIONS MAY RESULT IN THE CANCELLATION OF YOUR SURGERY  PATIENT SIGNATURE_________________________________  NURSE  SIGNATURE__________________________________  ________________________________________________________________________ [facility]- Preparing for Total Shoulder Arthroplasty    Before surgery, you can play an important role. Because skin is not sterile, your skin needs to be as free of germs as possible. You can reduce the number of germs on your skin by using the following products. Benzoyl Peroxide Gel Reduces the number of germs present on the skin Applied twice a day to shoulder area starting two days before surgery    ==================================================================  Please follow these instructions carefully:  BENZOYL PEROXIDE 5% GEL  Please do not use if you have an allergy to benzoyl peroxide.   If your skin becomes reddened/irritated stop using the benzoyl peroxide.  Starting two days before surgery, apply as follows: Apply benzoyl peroxide in the morning and at night. Apply after taking a shower. If you are not taking a shower clean entire shoulder front, back, and side along with the armpit with a clean wet washcloth.  Place a quarter-sized dollop on your shoulder and rub in thoroughly, making sure to cover the front, back, and side of your shoulder, along with the armpit.   2 days before ____ AM   ____ PM              1 day before ____ AM   ____ PM                         Do this twice a day for two days.  (Last application is the night before surgery, AFTER using the CHG soap as described below).  Do NOT apply benzoyl peroxide gel on the day of surgery.     Incentive Spirometer  An incentive spirometer is a tool that can help keep your lungs clear and active. This tool measures how well you are filling your lungs with each breath. Taking long deep breaths may help reverse or decrease the chance  of developing breathing (pulmonary) problems (especially infection) following: A long period of time when you are unable to move or be active. BEFORE THE  PROCEDURE  If the spirometer includes an indicator to show your best effort, your nurse or respiratory therapist will set it to a desired goal. If possible, sit up straight or lean slightly forward. Try not to slouch. Hold the incentive spirometer in an upright position. INSTRUCTIONS FOR USE  Sit on the edge of your bed if possible, or sit up as far as you can in bed or on a chair. Hold the incentive spirometer in an upright position. Breathe out normally. Place the mouthpiece in your mouth and seal your lips tightly around it. Breathe in slowly and as deeply as possible, raising the piston or the ball toward the top of the column. Hold your breath for 3-5 seconds or for as long as possible. Allow the piston or ball to fall to the bottom of the column. Remove the mouthpiece from your mouth and breathe out normally. Rest for a few seconds and repeat Steps 1 through 7 at least 10 times every 1-2 hours when you are awake. Take your time and take a few normal breaths between deep breaths. The spirometer may include an indicator to show your best effort. Use the indicator as a goal to work toward during each repetition. After each set of 10 deep breaths, practice coughing to be sure your lungs are clear. If you have an incision (the cut made at the time of surgery), support your incision when coughing by placing a pillow or rolled up towels firmly against it. Once you are able to get out of bed, walk around indoors and cough well. You may stop using the incentive spirometer when instructed by your caregiver.  RISKS AND COMPLICATIONS Take your time so you do not get dizzy or light-headed. If you are in pain, you may need to take or ask for pain medication before doing incentive spirometry. It is harder to take a deep breath if you are having pain. AFTER USE Rest and breathe slowly and easily. It can be helpful to keep track of a log of your progress. Your caregiver can provide you with a simple table  to help with this. If you are using the spirometer at home, follow these instructions: [place] IF:  You are having difficultly using the spirometer. You have trouble using the spirometer as often as instructed. Your pain medication is not giving enough relief while using the spirometer. You develop fever of 100.5 F (38.1 C) or higher. SEEK IMMEDIATE MEDICAL CARE IF:  You cough up bloody sputum that had not been present before. You develop fever of 102 F (38.9 C) or greater. You develop worsening pain at or near the incision site. MAKE SURE YOU:  Understand these instructions. Will watch your condition. Will get help right away if you are not doing well or get worse. Document Released: 03/23/2007 Document Revised: 02/02/2012 Document Reviewed: 05/24/2007 [facility] Patient Information 2014 [place], [place].   ________________________________________________________________________

## 2021-07-02 NOTE — Progress Notes (Addendum)
COVID Vaccine Completed: yes x3 Date COVID Vaccine completed: 01/18/20, 02/15/20 Has received booster: COVID vaccine manufacturer:  Moderna     Date of COVID positive in last 90 days: No  PCP - Jerene Bears, MD Cardiologist - Rozann Lesches, MD  Chest x-ray - 10/21/19 Epic EKG - 01/07/21 Epic Stress Test - long time ago per pt ECHO - 11/14/18 Epic Cardiac Cath - N/a Pacemaker/ICD device last checked: N/a Spinal Cord Stimulator: N/a  Sleep Study - N/a CPAP -   Fasting Blood Sugar - N/a Checks Blood Sugar _____ times a day  Blood Thinner Instructions: Eliquis, stop 3 days before surgery Aspirin Instructions: Last Dose: 07/09/21  Activity level: Can go up a flight of stairs and perform activities of daily living without stopping and without symptoms of chest pain or shortness of breath.     Anesthesia review: A fib, HTN  Patient denies shortness of breath, fever, cough and chest pain at PAT appointment   Patient verbalized understanding of instructions that were given to them at the PAT appointment. Patient was also instructed that they will need to review over the PAT instructions again at home before surgery.

## 2021-07-03 ENCOUNTER — Encounter (HOSPITAL_COMMUNITY): Payer: Self-pay

## 2021-07-03 ENCOUNTER — Other Ambulatory Visit: Payer: Self-pay

## 2021-07-03 ENCOUNTER — Encounter (HOSPITAL_COMMUNITY)
Admission: RE | Admit: 2021-07-03 | Discharge: 2021-07-03 | Disposition: A | Discharge status: Home / Self Care | Payer: Medicare Other | Source: Ambulatory Visit | Attending: Orthopedic Surgery | Admitting: Orthopedic Surgery

## 2021-07-03 DIAGNOSIS — Z7901 Long term (current) use of anticoagulants: Secondary | ICD-10-CM | POA: Insufficient documentation

## 2021-07-03 DIAGNOSIS — Z01812 Encounter for preprocedural laboratory examination: Secondary | ICD-10-CM | POA: Insufficient documentation

## 2021-07-03 LAB — BASIC METABOLIC PANEL
Anion gap: 7 (ref 5–15)
BUN: 13 mg/dL (ref 8–23)
CO2: 27 mmol/L (ref 22–32)
Calcium: 10.4 mg/dL — ABNORMAL HIGH (ref 8.9–10.3)
Chloride: 104 mmol/L (ref 98–111)
Creatinine, Ser: 0.76 mg/dL (ref 0.44–1.00)
GFR, Estimated: >60 mL/min (ref >60)
Glucose, Bld: 93 mg/dL (ref 70–99)
Potassium: 4.1 mmol/L (ref 3.5–5.1)
Sodium: 138 mmol/L (ref 135–145)

## 2021-07-03 LAB — CBC
HCT: 46 % (ref 36.0–46.0)
Hemoglobin: 15.2 g/dL — ABNORMAL HIGH (ref 12.0–15.0)
MCH: 30.8 pg (ref 26.0–34.0)
MCHC: 33 g/dL (ref 30.0–36.0)
MCV: 93.1 fL (ref 80.0–100.0)
Platelets: 286 10*3/uL (ref 150–400)
RBC: 4.94 MIL/uL (ref 3.87–5.11)
RDW: 12.9 % (ref 11.5–15.5)
WBC: 7.2 10*3/uL (ref 4.0–10.5)
nRBC: 0 % (ref 0.0–0.2)

## 2021-07-03 LAB — SURGICAL PCR SCREEN
MRSA, PCR: NEGATIVE
Staphylococcus aureus: NEGATIVE

## 2021-07-12 ENCOUNTER — Encounter (HOSPITAL_COMMUNITY)
Admission: RE | Disposition: A | Discharge status: Home / Self Care | Payer: Self-pay | Source: Ambulatory Visit | Attending: Orthopedic Surgery

## 2021-07-12 ENCOUNTER — Ambulatory Visit (HOSPITAL_COMMUNITY): Payer: Medicare Other | Admitting: Physician Assistant

## 2021-07-12 ENCOUNTER — Other Ambulatory Visit: Payer: Self-pay

## 2021-07-12 ENCOUNTER — Observation Stay (HOSPITAL_COMMUNITY): Payer: Medicare Other

## 2021-07-12 ENCOUNTER — Encounter (HOSPITAL_COMMUNITY): Payer: Self-pay | Admitting: Orthopedic Surgery

## 2021-07-12 ENCOUNTER — Observation Stay (HOSPITAL_COMMUNITY)
Admission: RE | Admit: 2021-07-12 | Discharge: 2021-07-13 | Disposition: A | Discharge status: Home / Self Care | Payer: Medicare Other | Source: Ambulatory Visit | Attending: Orthopedic Surgery | Admitting: Orthopedic Surgery

## 2021-07-12 ENCOUNTER — Ambulatory Visit (HOSPITAL_COMMUNITY): Payer: Medicare Other | Admitting: Anesthesiology

## 2021-07-12 DIAGNOSIS — Z96611 Presence of right artificial shoulder joint: Secondary | ICD-10-CM | POA: Diagnosis not present

## 2021-07-12 DIAGNOSIS — Z96612 Presence of left artificial shoulder joint: Secondary | ICD-10-CM | POA: Diagnosis not present

## 2021-07-12 DIAGNOSIS — M19011 Primary osteoarthritis, right shoulder: Secondary | ICD-10-CM | POA: Diagnosis not present

## 2021-07-12 DIAGNOSIS — G8918 Other acute postprocedural pain: Secondary | ICD-10-CM | POA: Diagnosis not present

## 2021-07-12 DIAGNOSIS — S42231P 3-part fracture of surgical neck of right humerus, subsequent encounter for fracture with malunion: Secondary | ICD-10-CM | POA: Diagnosis not present

## 2021-07-12 DIAGNOSIS — Z20822 Contact with and (suspected) exposure to covid-19: Secondary | ICD-10-CM | POA: Insufficient documentation

## 2021-07-12 DIAGNOSIS — E039 Hypothyroidism, unspecified: Secondary | ICD-10-CM | POA: Diagnosis not present

## 2021-07-12 DIAGNOSIS — I4891 Unspecified atrial fibrillation: Secondary | ICD-10-CM | POA: Diagnosis not present

## 2021-07-12 DIAGNOSIS — I10 Essential (primary) hypertension: Secondary | ICD-10-CM | POA: Insufficient documentation

## 2021-07-12 DIAGNOSIS — Z471 Aftercare following joint replacement surgery: Secondary | ICD-10-CM | POA: Diagnosis not present

## 2021-07-12 DIAGNOSIS — Z91040 Latex allergy status: Secondary | ICD-10-CM | POA: Diagnosis not present

## 2021-07-12 DIAGNOSIS — Z79899 Other long term (current) drug therapy: Secondary | ICD-10-CM | POA: Insufficient documentation

## 2021-07-12 DIAGNOSIS — X58XXXD Exposure to other specified factors, subsequent encounter: Secondary | ICD-10-CM | POA: Diagnosis not present

## 2021-07-12 DIAGNOSIS — Z96651 Presence of right artificial knee joint: Secondary | ICD-10-CM | POA: Insufficient documentation

## 2021-07-12 DIAGNOSIS — S42201P Unspecified fracture of upper end of right humerus, subsequent encounter for fracture with malunion: Secondary | ICD-10-CM | POA: Diagnosis not present

## 2021-07-12 DIAGNOSIS — S42291P Other displaced fracture of upper end of right humerus, subsequent encounter for fracture with malunion: Secondary | ICD-10-CM | POA: Insufficient documentation

## 2021-07-12 HISTORY — PX: REVERSE SHOULDER ARTHROPLASTY: SHX5054

## 2021-07-12 LAB — SARS CORONAVIRUS 2 BY RT PCR (HOSPITAL ORDER, PERFORMED IN ~~LOC~~ HOSPITAL LAB): SARS Coronavirus 2: NEGATIVE

## 2021-07-12 SURGERY — ARTHROPLASTY, SHOULDER, TOTAL, REVERSE
Anesthesia: General | Site: Shoulder | Laterality: Right

## 2021-07-12 MED ORDER — METHOCARBAMOL 1000 MG/10ML IJ SOLN
500.0000 mg | Freq: Four times a day (QID) | INTRAVENOUS | Status: DC | PRN
  Filled 2021-07-12: qty 5

## 2021-07-12 MED ORDER — METOCLOPRAMIDE HCL 5 MG/ML IJ SOLN: 5.0000 mg | Freq: Three times a day (TID) | INTRAMUSCULAR | Status: DC | PRN

## 2021-07-12 MED ORDER — PROPOFOL 10 MG/ML IV BOLUS
INTRAVENOUS | Status: AC
  Filled 2021-07-12: qty 20

## 2021-07-12 MED ORDER — ACETAMINOPHEN 500 MG PO TABS
1000.0000 mg | ORAL_TABLET | Freq: Four times a day (QID) | ORAL | Status: DC | PRN
  Administered 2021-07-12: 1000 mg via ORAL
  Filled 2021-07-12: qty 2

## 2021-07-12 MED ORDER — SPIRONOLACTONE 25 MG PO TABS
25.0000 mg | ORAL_TABLET | Freq: Every day | ORAL | Status: DC
  Administered 2021-07-12 – 2021-07-13 (×2): 25 mg via ORAL
  Filled 2021-07-12 (×2): qty 1

## 2021-07-12 MED ORDER — ACETAMINOPHEN 500 MG PO TABS
500.0000 mg | ORAL_TABLET | Freq: Once | ORAL | Status: AC
  Administered 2021-07-12: 500 mg via ORAL
  Filled 2021-07-12: qty 1

## 2021-07-12 MED ORDER — POLYETHYLENE GLYCOL 3350 17 G PO PACK: 17.0000 g | PACK | Freq: Every day | ORAL | Status: DC | PRN

## 2021-07-12 MED ORDER — STERILE WATER FOR IRRIGATION IR SOLN
Status: DC | PRN
  Administered 2021-07-12: 2000 mL

## 2021-07-12 MED ORDER — CHLORHEXIDINE GLUCONATE 0.12 % MT SOLN
15.0000 mL | Freq: Once | OROMUCOSAL | Status: AC
  Administered 2021-07-12: 15 mL via OROMUCOSAL

## 2021-07-12 MED ORDER — DEXAMETHASONE SODIUM PHOSPHATE 10 MG/ML IJ SOLN
INTRAMUSCULAR | Status: DC | PRN
  Administered 2021-07-12: 8 mg via INTRAVENOUS

## 2021-07-12 MED ORDER — BUPIVACAINE-EPINEPHRINE (PF) 0.5% -1:200000 IJ SOLN
INTRAMUSCULAR | Status: DC | PRN
  Administered 2021-07-12: 15 mL via PERINEURAL

## 2021-07-12 MED ORDER — FENTANYL CITRATE (PF) 100 MCG/2ML IJ SOLN
INTRAMUSCULAR | Status: DC | PRN
  Administered 2021-07-12: 50 ug via INTRAVENOUS
  Administered 2021-07-12 (×2): 25 ug via INTRAVENOUS

## 2021-07-12 MED ORDER — PROBIOTIC DIGESTIVE SUPPORT PO CAPS: 2.0000 | ORAL_CAPSULE | Freq: Every day | ORAL | Status: DC

## 2021-07-12 MED ORDER — MAGNESIUM 200 MG PO TABS: 266.0000 mg | ORAL_TABLET | Freq: Every day | ORAL | Status: DC

## 2021-07-12 MED ORDER — ONDANSETRON HCL 4 MG PO TABS: 4.0000 mg | ORAL_TABLET | Freq: Every day | ORAL | 1 refills | Status: DC | PRN

## 2021-07-12 MED ORDER — VANCOMYCIN HCL IN DEXTROSE 1-5 GM/200ML-% IV SOLN
INTRAVENOUS | Status: AC
  Filled 2021-07-12: qty 200

## 2021-07-12 MED ORDER — MORPHINE SULFATE (PF) 2 MG/ML IV SOLN: 0.5000 mg | INTRAVENOUS | Status: DC | PRN

## 2021-07-12 MED ORDER — ONDANSETRON HCL 4 MG PO TABS
4.0000 mg | ORAL_TABLET | Freq: Four times a day (QID) | ORAL | Status: DC | PRN
  Administered 2021-07-13: 4 mg via ORAL
  Filled 2021-07-12: qty 1

## 2021-07-12 MED ORDER — METHOCARBAMOL 500 MG PO TABS
500.0000 mg | ORAL_TABLET | Freq: Four times a day (QID) | ORAL | Status: DC | PRN
  Administered 2021-07-13: 500 mg via ORAL
  Filled 2021-07-12: qty 1

## 2021-07-12 MED ORDER — LACTATED RINGERS IV SOLN: INTRAVENOUS | Status: DC

## 2021-07-12 MED ORDER — FENTANYL CITRATE (PF) 100 MCG/2ML IJ SOLN: 25.0000 ug | INTRAMUSCULAR | Status: DC | PRN

## 2021-07-12 MED ORDER — BUPIVACAINE-EPINEPHRINE (PF) 0.25% -1:200000 IJ SOLN
INTRAMUSCULAR | Status: DC | PRN
  Administered 2021-07-12: 14 mL

## 2021-07-12 MED ORDER — ROCURONIUM BROMIDE 10 MG/ML (PF) SYRINGE
PREFILLED_SYRINGE | INTRAVENOUS | Status: AC
  Filled 2021-07-12: qty 10

## 2021-07-12 MED ORDER — PROPOFOL 10 MG/ML IV BOLUS
INTRAVENOUS | Status: DC | PRN
  Administered 2021-07-12: 150 mg via INTRAVENOUS

## 2021-07-12 MED ORDER — CITALOPRAM HYDROBROMIDE 20 MG PO TABS
40.0000 mg | ORAL_TABLET | Freq: Every day | ORAL | Status: DC
  Administered 2021-07-13: 40 mg via ORAL
  Filled 2021-07-12: qty 2

## 2021-07-12 MED ORDER — HYDROCODONE-ACETAMINOPHEN 5-325 MG PO TABS: 1.0000 | ORAL_TABLET | Freq: Four times a day (QID) | ORAL | 0 refills | Status: DC | PRN

## 2021-07-12 MED ORDER — ACETAMINOPHEN 325 MG PO TABS
325.0000 mg | ORAL_TABLET | Freq: Four times a day (QID) | ORAL | Status: DC | PRN
  Administered 2021-07-13 (×2): 650 mg via ORAL
  Filled 2021-07-12 (×2): qty 2

## 2021-07-12 MED ORDER — PHENOL 1.4 % MT LIQD: 1.0000 | OROMUCOSAL | Status: DC | PRN

## 2021-07-12 MED ORDER — SODIUM CHLORIDE 0.9 % IR SOLN
Status: DC | PRN
  Administered 2021-07-12: 1000 mL

## 2021-07-12 MED ORDER — ONDANSETRON HCL 4 MG/2ML IJ SOLN
INTRAMUSCULAR | Status: DC | PRN
  Administered 2021-07-12: 4 mg via INTRAVENOUS

## 2021-07-12 MED ORDER — PHENYLEPHRINE HCL (PRESSORS) 10 MG/ML IV SOLN
INTRAVENOUS | Status: AC
  Filled 2021-07-12: qty 2

## 2021-07-12 MED ORDER — 0.9 % SODIUM CHLORIDE (POUR BTL) OPTIME
TOPICAL | Status: DC | PRN
  Administered 2021-07-12: 1000 mL

## 2021-07-12 MED ORDER — ADULT MULTIVITAMIN W/MINERALS CH
1.0000 | ORAL_TABLET | Freq: Every day | ORAL | Status: DC
  Administered 2021-07-13: 1 via ORAL
  Filled 2021-07-12: qty 1

## 2021-07-12 MED ORDER — MAGNESIUM OXIDE -MG SUPPLEMENT 400 (240 MG) MG PO TABS
400.0000 mg | ORAL_TABLET | Freq: Every day | ORAL | Status: DC
  Administered 2021-07-13: 400 mg via ORAL
  Filled 2021-07-12: qty 1

## 2021-07-12 MED ORDER — RISAQUAD PO CAPS
1.0000 | ORAL_CAPSULE | Freq: Every day | ORAL | Status: DC
  Administered 2021-07-13: 1 via ORAL
  Filled 2021-07-12: qty 1

## 2021-07-12 MED ORDER — MULTI-VITAMIN/MINERALS PO TABS: 1.0000 | ORAL_TABLET | Freq: Every day | ORAL | Status: DC

## 2021-07-12 MED ORDER — ONDANSETRON HCL 4 MG/2ML IJ SOLN
INTRAMUSCULAR | Status: AC
  Filled 2021-07-12: qty 2

## 2021-07-12 MED ORDER — DOCUSATE SODIUM 100 MG PO CAPS
100.0000 mg | ORAL_CAPSULE | Freq: Two times a day (BID) | ORAL | Status: DC
  Administered 2021-07-12 – 2021-07-13 (×2): 100 mg via ORAL
  Filled 2021-07-12 (×2): qty 1

## 2021-07-12 MED ORDER — FENTANYL CITRATE (PF) 100 MCG/2ML IJ SOLN
50.0000 ug | INTRAMUSCULAR | Status: DC
  Administered 2021-07-12: 50 ug via INTRAVENOUS
  Filled 2021-07-12: qty 2

## 2021-07-12 MED ORDER — SODIUM CHLORIDE 0.9 % IV SOLN: INTRAVENOUS | Status: DC

## 2021-07-12 MED ORDER — BUPIVACAINE-EPINEPHRINE (PF) 0.25% -1:200000 IJ SOLN
INTRAMUSCULAR | Status: AC
  Filled 2021-07-12: qty 30

## 2021-07-12 MED ORDER — ACETAMINOPHEN 500 MG PO TABS: 1000.0000 mg | ORAL_TABLET | Freq: Once | ORAL | Status: DC

## 2021-07-12 MED ORDER — METOCLOPRAMIDE HCL 5 MG PO TABS: 5.0000 mg | ORAL_TABLET | Freq: Three times a day (TID) | ORAL | Status: DC | PRN

## 2021-07-12 MED ORDER — ROCURONIUM BROMIDE 10 MG/ML (PF) SYRINGE
PREFILLED_SYRINGE | INTRAVENOUS | Status: DC | PRN
  Administered 2021-07-12: 50 mg via INTRAVENOUS

## 2021-07-12 MED ORDER — DILTIAZEM HCL ER COATED BEADS 240 MG PO CP24
240.0000 mg | ORAL_CAPSULE | Freq: Every day | ORAL | Status: DC
  Administered 2021-07-13: 240 mg via ORAL
  Filled 2021-07-12: qty 1

## 2021-07-12 MED ORDER — LIDOCAINE 2% (20 MG/ML) 5 ML SYRINGE
INTRAMUSCULAR | Status: AC
  Filled 2021-07-12: qty 5

## 2021-07-12 MED ORDER — VANCOMYCIN HCL 1000 MG IV SOLR
INTRAVENOUS | Status: DC | PRN
  Administered 2021-07-12: 1000 mg via INTRAVENOUS

## 2021-07-12 MED ORDER — METHOCARBAMOL 500 MG PO TABS: 500.0000 mg | ORAL_TABLET | Freq: Three times a day (TID) | ORAL | 1 refills | Status: DC | PRN

## 2021-07-12 MED ORDER — VITAMIN D 25 MCG (1000 UNIT) PO TABS
2000.0000 [IU] | ORAL_TABLET | Freq: Every day | ORAL | Status: DC
  Administered 2021-07-13: 2000 [IU] via ORAL
  Filled 2021-07-12 (×2): qty 2

## 2021-07-12 MED ORDER — SIMVASTATIN 20 MG PO TABS
10.0000 mg | ORAL_TABLET | Freq: Every day | ORAL | Status: DC
  Administered 2021-07-13: 10 mg via ORAL
  Filled 2021-07-12: qty 1

## 2021-07-12 MED ORDER — OXYCODONE HCL 5 MG PO TABS: 5.0000 mg | ORAL_TABLET | Freq: Once | ORAL | Status: DC | PRN

## 2021-07-12 MED ORDER — CLINDAMYCIN PHOSPHATE 900 MG/50ML IV SOLN
900.0000 mg | INTRAVENOUS | Status: AC
  Administered 2021-07-12: 900 mg via INTRAVENOUS
  Filled 2021-07-12: qty 50

## 2021-07-12 MED ORDER — APIXABAN 5 MG PO TABS
5.0000 mg | ORAL_TABLET | Freq: Two times a day (BID) | ORAL | Status: DC
  Administered 2021-07-13: 5 mg via ORAL
  Filled 2021-07-12: qty 1

## 2021-07-12 MED ORDER — FENTANYL CITRATE (PF) 100 MCG/2ML IJ SOLN
INTRAMUSCULAR | Status: AC
  Filled 2021-07-12: qty 2

## 2021-07-12 MED ORDER — DEXAMETHASONE SODIUM PHOSPHATE 10 MG/ML IJ SOLN
INTRAMUSCULAR | Status: AC
  Filled 2021-07-12: qty 1

## 2021-07-12 MED ORDER — VANCOMYCIN HCL IN DEXTROSE 1-5 GM/200ML-% IV SOLN
1000.0000 mg | Freq: Once | INTRAVENOUS | Status: AC
  Administered 2021-07-12: 1000 mg via INTRAVENOUS
  Filled 2021-07-12: qty 200

## 2021-07-12 MED ORDER — OXYCODONE HCL 5 MG/5ML PO SOLN: 5.0000 mg | Freq: Once | ORAL | Status: DC | PRN

## 2021-07-12 MED ORDER — ONDANSETRON HCL 4 MG/2ML IJ SOLN: 4.0000 mg | Freq: Four times a day (QID) | INTRAMUSCULAR | Status: DC | PRN

## 2021-07-12 MED ORDER — PHENYLEPHRINE HCL-NACL 20-0.9 MG/250ML-% IV SOLN
INTRAVENOUS | Status: DC | PRN
  Administered 2021-07-12: 30 ug/min via INTRAVENOUS

## 2021-07-12 MED ORDER — MENTHOL 3 MG MT LOZG: 1.0000 | LOZENGE | OROMUCOSAL | Status: DC | PRN

## 2021-07-12 MED ORDER — LIDOCAINE 2% (20 MG/ML) 5 ML SYRINGE
INTRAMUSCULAR | Status: DC | PRN
  Administered 2021-07-12: 100 mg via INTRAVENOUS

## 2021-07-12 MED ORDER — HYDROCODONE-ACETAMINOPHEN 5-325 MG PO TABS: 1.0000 | ORAL_TABLET | ORAL | Status: DC | PRN

## 2021-07-12 MED ORDER — LEVOTHYROXINE SODIUM 75 MCG PO TABS
75.0000 ug | ORAL_TABLET | Freq: Every day | ORAL | Status: DC
  Administered 2021-07-13: 75 ug via ORAL
  Filled 2021-07-12: qty 1

## 2021-07-12 MED ORDER — ORAL CARE MOUTH RINSE: 15.0000 mL | Freq: Once | OROMUCOSAL | Status: AC

## 2021-07-12 MED ORDER — BUPIVACAINE LIPOSOME 1.3 % IJ SUSP
INTRAMUSCULAR | Status: DC | PRN
  Administered 2021-07-12: 10 mL via PERINEURAL

## 2021-07-12 MED ORDER — MIDAZOLAM HCL 2 MG/2ML IJ SOLN
1.0000 mg | INTRAMUSCULAR | Status: DC
  Filled 2021-07-12: qty 2

## 2021-07-12 SURGICAL SUPPLY — 69 items
BAG COUNTER SPONGE SURGICOUNT (BAG) IMPLANT
BAG ZIPLOCK 12X15 (MISCELLANEOUS) IMPLANT
BIT DRILL 1.6MX128 (BIT) ×2 IMPLANT
BIT DRILL 170X2.5X (BIT) ×1 IMPLANT
BIT DRL 170X2.5X (BIT) ×1
BLADE SAG 18X100X1.27 (BLADE) ×2 IMPLANT
COVER BACK TABLE 60X90IN (DRAPES) ×2 IMPLANT
COVER SURGICAL LIGHT HANDLE (MISCELLANEOUS) ×2 IMPLANT
DECANTER SPIKE VIAL GLASS SM (MISCELLANEOUS) ×2 IMPLANT
DRAPE INCISE IOBAN 66X45 STRL (DRAPES) ×2 IMPLANT
DRAPE ORTHO SPLIT 77X108 STRL (DRAPES) ×4
DRAPE SHEET LG 3/4 BI-LAMINATE (DRAPES) ×2 IMPLANT
DRAPE SURG ORHT 6 SPLT 77X108 (DRAPES) ×2 IMPLANT
DRAPE TOP 10253 STERILE (DRAPES) ×2 IMPLANT
DRAPE U-SHAPE 47X51 STRL (DRAPES) ×2 IMPLANT
DRILL 2.5 (BIT) ×2
DRSG ADAPTIC 3X8 NADH LF (GAUZE/BANDAGES/DRESSINGS) ×2 IMPLANT
DRSG PAD ABDOMINAL 8X10 ST (GAUZE/BANDAGES/DRESSINGS) ×2 IMPLANT
DURAPREP 26ML APPLICATOR (WOUND CARE) ×2 IMPLANT
ELECT BLADE TIP CTD 4 INCH (ELECTRODE) ×2 IMPLANT
ELECT NEEDLE TIP 2.8 STRL (NEEDLE) ×2 IMPLANT
ELECT REM PT RETURN 15FT ADLT (MISCELLANEOUS) ×2 IMPLANT
FACESHIELD WRAPAROUND (MASK) ×2 IMPLANT
GAUZE SPONGE 4X4 12PLY STRL (GAUZE/BANDAGES/DRESSINGS) ×2 IMPLANT
GLENOSPHERE DELTA XTEND LAT 38 (Miscellaneous) ×2 IMPLANT
GLOVE SURG ORTHO LTX SZ7.5 (GLOVE) ×2 IMPLANT
GLOVE SURG ORTHO LTX SZ8.5 (GLOVE) ×2 IMPLANT
GLOVE SURG UNDER POLY LF SZ7.5 (GLOVE) ×2 IMPLANT
GLOVE SURG UNDER POLY LF SZ8.5 (GLOVE) ×2 IMPLANT
GOWN STRL REUS W/TWL XL LVL3 (GOWN DISPOSABLE) ×4 IMPLANT
KIT BASIN OR (CUSTOM PROCEDURE TRAY) ×2 IMPLANT
KIT TURNOVER KIT A (KITS) ×2 IMPLANT
MANIFOLD NEPTUNE II (INSTRUMENTS) ×2 IMPLANT
METAGLENE DELTA EXTEND (Trauma) ×1 IMPLANT
METAGLENE DXTEND (Trauma) ×2 IMPLANT
NEEDLE MAYO CATGUT SZ4 (NEEDLE) IMPLANT
NS IRRIG 1000ML POUR BTL (IV SOLUTION) ×2 IMPLANT
PACK SHOULDER (CUSTOM PROCEDURE TRAY) ×2 IMPLANT
PIN GUIDE 1.2 (PIN) ×2 IMPLANT
PIN GUIDE GLENOPHERE 1.5MX300M (PIN) ×2 IMPLANT
PIN METAGLENE 2.5 (PIN) ×2 IMPLANT
PROTECTOR NERVE ULNAR (MISCELLANEOUS) ×2 IMPLANT
RESTRAINT HEAD UNIVERSAL NS (MISCELLANEOUS) ×2 IMPLANT
SCREW 4.5X18MM (Screw) ×2 IMPLANT
SCREW 4.5X24MM (Screw) ×2 IMPLANT
SCREW BN 18X4.5XSTRL SHLDR (Screw) ×1 IMPLANT
SCREW BN 24X4.5XLCK STRL (Screw) ×1 IMPLANT
SCREW LOCK 42 (Screw) ×2 IMPLANT
SLING ARM FOAM STRAP LRG (SOFTGOODS) ×2 IMPLANT
SMARTMIX MINI TOWER (MISCELLANEOUS)
SPACER 38 PLUS 3 (Spacer) ×2 IMPLANT
SPONGE T-LAP 4X18 ~~LOC~~+RFID (SPONGE) IMPLANT
STAPLER VISISTAT 35W (STAPLE) ×2 IMPLANT
STEM EPIPHYSIS SHOULDER (Stem) ×2 IMPLANT
STEM STANDARD SZ 10 113MM (Stem) ×2 IMPLANT
STEM STD SZ 10 113MM (Stem) ×1 IMPLANT
STRIP CLOSURE SKIN 1/2X4 (GAUZE/BANDAGES/DRESSINGS) ×2 IMPLANT
SUCTION FRAZIER HANDLE 10FR (MISCELLANEOUS) ×2
SUCTION TUBE FRAZIER 10FR DISP (MISCELLANEOUS) ×1 IMPLANT
SUT FIBERWIRE #2 38 T-5 BLUE (SUTURE) ×12
SUT MNCRL AB 4-0 PS2 18 (SUTURE) ×2 IMPLANT
SUT VIC AB 0 CT1 36 (SUTURE) ×4 IMPLANT
SUT VIC AB 0 CT2 27 (SUTURE) ×2 IMPLANT
SUT VIC AB 2-0 CT1 27 (SUTURE) ×2
SUT VIC AB 2-0 CT1 TAPERPNT 27 (SUTURE) ×1 IMPLANT
SUTURE FIBERWR #2 38 T-5 BLUE (SUTURE) ×6 IMPLANT
TAPE CLOTH SURG 6X10 WHT LF (GAUZE/BANDAGES/DRESSINGS) ×2 IMPLANT
TOWEL OR 17X26 10 PK STRL BLUE (TOWEL DISPOSABLE) ×2 IMPLANT
TOWER SMARTMIX MINI (MISCELLANEOUS) IMPLANT

## 2021-07-12 NOTE — Progress Notes (Signed)
Assisted Dr. Daiva Huge with right, ultrasound guided, interscalene  block. Side rails up, monitors on throughout procedure. See vital signs in flow sheet. Tolerated Procedure well.

## 2021-07-12 NOTE — Anesthesia Postprocedure Evaluation (Signed)
Anesthesia Post Note  Patient: [name]  Procedure(s) Performed: REVERSE SHOULDER ARTHROPLASTY (Right: Shoulder)     Patient location during evaluation: PACU Anesthesia Type: General Level of consciousness: awake and alert and oriented Pain management: pain level controlled Vital Signs Assessment: post-procedure vital signs reviewed and stable Respiratory status: spontaneous breathing, nonlabored ventilation and respiratory function stable Cardiovascular status: blood pressure returned to baseline Postop Assessment: no apparent nausea or vomiting Anesthetic complications: no   No notable events documented.  Last Vitals:  Vitals:   07/12/21 1315 07/12/21 1330  BP: (!) 104/53 102/66  Pulse: 64 67  Resp: 14 18  Temp:    SpO2: 100% 94%    Last Pain:  Vitals:   07/12/21 1330  TempSrc:   PainSc: 0-No pain                 Marthenia Rolling

## 2021-07-12 NOTE — Anesthesia Procedure Notes (Signed)
Procedure Name: Intubation Date/Time: 07/12/2021 10:33 AM Performed by: Cleda Daub, CRNA Pre-anesthesia Checklist: Patient identified, Emergency Drugs available, Suction available and Patient being monitored Patient Re-evaluated:Patient Re-evaluated prior to induction Oxygen Delivery Method: Circle system utilized Preoxygenation: Pre-oxygenation with 100% oxygen Induction Type: IV induction Ventilation: Mask ventilation without difficulty Laryngoscope Size: Mac and 3 Grade View: Grade I Tube type: Oral Tube size: 7.0 mm Number of attempts: 1 Airway Equipment and Method: Stylet and Oral airway Placement Confirmation: ETT inserted through vocal cords under direct vision, positive ETCO2 and breath sounds checked- equal and bilateral Secured at: 21 cm Tube secured with: Tape Dental Injury: Teeth and Oropharynx as per pre-operative assessment

## 2021-07-12 NOTE — Op Note (Signed)
NAMEMICKEL, HAMIDEH MEDICAL RECORD NO: [id number] ACCOUNT NO: [id number] DATE OF BIRTH: 20-Sep-1946 FACILITY: [facility]: [id number] PHYSICIAN: Doran Heater. Veverly Fells, MD  Operative Report   DATE OF PROCEDURE: 07/12/2021  PREOPERATIVE DIAGNOSIS:  Right shoulder proximal humerus fracture with malunion.  POSTOPERATIVE DIAGNOSIS:  Right shoulder proximal humerus fracture with malunion.  PROCEDURE PERFORMED:  Right reverse shoulder replacement with tuberosity repair using DePuy Delta Xtend prosthesis.  ATTENDING SURGEON:  Doran Heater. Veverly Fells, MD  ASSISTANT:  Charletta Cousin [place], [place], who was scrubbed during the entire procedure, and necessary for satisfactory completion of surgery.  ANESTHESIA:  General anesthesia was used plus interscalene block.  ESTIMATED BLOOD LOSS:  Minimal.  FLUID REPLACEMENT:  1200 mL crystalloid.  Instrument counts were correct.  No complications.  Perioperative antibiotics were given.  INDICATIONS:  The patient is a 75 year old female who suffered a proximal humerus fracture, initially treated conservatively.  On most recent followup, the patient was noted to have complete displacement of humeral head, which had basically rolled off  the shaft, which we translated proximally.  The head rotated posteriorly and into varus.  Given the poor location of the head relative to the shaft, her declining function and ongoing pain we discussed options, recommending reverse shoulder placement  given the patient's advanced age and the presence of arthritis in her shoulder joint.  Informed consent obtained.  DESCRIPTION OF PROCEDURE:  After an adequate level of anesthesia was achieved, the patient positioned in the modified beach chair position.  Right shoulder correctly identified and sterilely prepped and draped in the usual manner.  Time out called,  verifying correct patient, correct site being the patient's shoulder.  Using a standard deltopectoral approach, starting at the  coracoid process extending down to the anterior humerus.  Dissection down through subcutaneous tissues using Bovie.  We  identified the cephalic vein and took that laterally to the deltoid.  Pectoralis was taken medially.  Conjoined tendon identified and retracted medially.  Biceps tendon identified.  We used a Cobb elevator to free the humeral head off the medial aspect  of the shaft of the humerus.  There was a complete 100% displacement of the head relative to the shaft and poor angulation.  Once we were able to identify the proper orientation of the head and the biceps groove, we osteotomized the lesser tuberosity and  greater tuberosity off the humeral head, removing the humeral head in piecemeal.  We tagged the lesser tuberosity with #2 FiberWire suture in a mattress fashion with sutures coming out superficially to the tuberosity and the sutures being in the  subscap.  We took two mattress sutures in the lateral aspect of the rotator cuff as well in the supraspinatus, infraspinatus, teres minor.  These were mattress sutures lateral to the greater tuberosity with good tendon purchase.  We then removed the  remainder of the head.  We subluxed the humerus inferiorly.  We were able to get good exposure of the glenoid face, which was devoid of cartilage with large osteophytes.  We removed the peripheral osteophytes to get down to the native glenoid face.  We  drilled our guide pin center on the inferior portion of the glenoid, reamed for the metaglene baseplate and drilled out our central peg hole.  We impacted the metaglene into position and had good bony support.  We placed a 42 screw inferiorly, a 24 screw  superiorly and locked both those and then 18 anteriorly.  We then used a 38 standard glenosphere and attached  that to the metaglene baseplate.  Once we had that glenosphere in position, I did a finger sweep to make sure there was no bone that was still  left in the glenoid vault or soft tissue caught  up into the bearing.  Once I assured that and the glenosphere was stable we went back up to the humeral side.  We prepped reaming from a 6 to a size 10.  We used then a POROCOAT 10 stem with a 1 centered  metaphysis and we placed that in 10 degrees of retroversion and impacted that into the shaft.  We then reduced the shoulder where we had the fracture metaphysis and then we reduced the shoulder with a +3 poly 30+3.  We felt like we had good stability.  I  was able to get the tuberosities reduced, so we removed all the trial components.  We irrigated it thoroughly, drilled holes in the shaft and placed #2 FiberWire sutures for repair of the tuberosity.  We placed around the whipstitch through the central  part of the stem and we used again the POROCOAT press-fit components.  The 10 stem and the one centered set on the 0 setting and placed in 10 degrees of retroversion.  We impacted that into the canal.  We had good stable implant.  We then used a 30+3  real poly impacted on the humeral tray and then reduced the shoulder, had nice little pop as it was reduced.  We reduced the tuberosities.  We bone grafted between the POROCOAT metaphysis and the tuberosities and then anatomically repaired the  tuberosities back into position, suturing tuberosities to mature that with a rotator interval closure and also sutures to the stem into the shaft, so we had a real solid repair.  Everything moved together as a unit.  Did not restrict motion whatsoever.   We irrigated thoroughly and repaired the deltopectoral interval with 0 Vicryl suture followed by 2-0 Vicryl for subcutaneous closure and 4-0 Monocryl for skin and portals.  Steri-Strips were applied followed by sterile dressing.  The patient tolerated  the procedure well.   PUS D: 07/12/2021 12:38:06 pm T: 07/12/2021 6:38:00 pm  JOB: [id number]

## 2021-07-12 NOTE — Plan of Care (Signed)
  Problem: Activity: Goal: Ability to tolerate increased activity will improve Outcome: Progressing   Problem: Pain Management: Goal: Pain level will decrease with appropriate interventions Outcome: Progressing   Problem: Education: Goal: Knowledge of General Education information will improve Description: Including pain rating scale, medication(s)/side effects and non-pharmacologic comfort measures Outcome: Progressing   Problem: Clinical Measurements: Goal: Ability to maintain clinical measurements within normal limits will improve Outcome: Progressing   Problem: Coping: Goal: Level of anxiety will decrease Outcome: Progressing   Problem: Elimination: Goal: Will not experience complications related to bowel motility Outcome: Progressing   Problem: Safety: Goal: Ability to remain free from injury will improve Outcome: Progressing   Problem: Skin Integrity: Goal: Risk for impaired skin integrity will decrease Outcome: Progressing

## 2021-07-12 NOTE — Anesthesia Procedure Notes (Signed)
Anesthesia Regional Block: Interscalene brachial plexus block   Pre-Anesthetic Checklist: , timeout performed,  Correct Patient, Correct Site, Correct Laterality,  Correct Procedure, Correct Position, site marked,  Risks and benefits discussed,  Pre-op evaluation,  At surgeon's request and post-op pain management  Laterality: Right  Prep: Maximum Sterile Barrier Precautions used, chloraprep       Needles:  Injection technique: Single-shot  Needle Type: Echogenic Stimulator Needle     Needle Length: 9cm  Needle Gauge: 22     Additional Needles:   Procedures:,,,, ultrasound used (permanent image in chart),,    Narrative:  Start time: 07/12/2021 9:48 AM End time: 07/12/2021 9:51 AM Injection made incrementally with aspirations every 5 mL.  Performed by: Personally  Anesthesiologist: Brennan Bailey, MD  Additional Notes: Risks, benefits, and alternative discussed. Patient gave consent for procedure. Patient prepped and draped in sterile fashion. Sedation administered, patient remains easily responsive to voice. Relevant anatomy identified with ultrasound guidance. Local anesthetic given in 5cc increments with no signs or symptoms of intravascular injection. No pain or paraesthesias with injection. Patient monitored throughout procedure with signs of LAST or immediate complications. Tolerated well. Ultrasound image placed in chart.  Tawny Asal, MD

## 2021-07-12 NOTE — Anesthesia Preprocedure Evaluation (Addendum)
Anesthesia Evaluation  Patient identified by MRN, date of birth, ID band Patient awake    Reviewed: Allergy & Precautions, NPO status , Patient's Chart, lab work & pertinent test results  History of Anesthesia Complications Negative for: history of anesthetic complications  Airway Mallampati: II  TM Distance: >3 FB Neck ROM: Full    Dental  (+) Missing,    Pulmonary neg pulmonary ROS,    Pulmonary exam normal        Cardiovascular hypertension, Pt. on medications Normal cardiovascular exam+ dysrhythmias (on Eliquis (last dose 07/09/21)) Atrial Fibrillation   TTE 2019: moderate LVH, EF 0000000, grade 2 diastolic dysfunction   Neuro/Psych Anxiety Depression negative neurological ROS     GI/Hepatic Neg liver ROS, GERD  Controlled,  Endo/Other  Hypothyroidism   Renal/GU negative Renal ROS  negative genitourinary   Musculoskeletal  (+) Arthritis ,   Abdominal   Peds  Hematology negative hematology ROS (+)   Anesthesia Other Findings Day of surgery medications reviewed with patient.  Reproductive/Obstetrics negative OB ROS                           Anesthesia Physical Anesthesia Plan  ASA: 2  Anesthesia Plan: General   Post-op Pain Management: GA combined w/ Regional for post-op pain   Induction: Intravenous  PONV Risk Score and Plan: 3 and Treatment may vary due to age or medical condition, Ondansetron and Dexamethasone  Airway Management Planned: Oral ETT  Additional Equipment: None  Intra-op Plan:   Post-operative Plan: Extubation in OR  Informed Consent: I have reviewed the patients History and Physical, chart, labs and discussed the procedure including the risks, benefits and alternatives for the proposed anesthesia with the patient or authorized representative who has indicated his/her understanding and acceptance.     Dental advisory given  Plan Discussed with:  CRNA  Anesthesia Plan Comments:        Anesthesia Quick Evaluation

## 2021-07-12 NOTE — Transfer of Care (Signed)
Immediate Anesthesia Transfer of Care Note  Patient: [name]  Procedure(s) Performed: REVERSE SHOULDER ARTHROPLASTY (Right: Shoulder)  Patient Location: PACU  Anesthesia Type:GA combined with regional for post-op pain  Level of Consciousness: awake, alert , oriented and patient cooperative  Airway & Oxygen Therapy: Patient Spontanous Breathing and Patient connected to face mask oxygen  Post-op Assessment: Report given to RN and Post -op Vital signs reviewed and stable  Post vital signs: Reviewed and stable  Last Vitals:  Vitals Value Taken Time  BP 124/59 07/12/21 1246  Temp    Pulse 61 07/12/21 1250  Resp 14 07/12/21 1250  SpO2 100 % 07/12/21 1250  Vitals shown include unvalidated device data.  Last Pain:  Vitals:   07/12/21 1004  TempSrc:   PainSc: 0-No pain         Complications: No notable events documented.

## 2021-07-12 NOTE — Brief Op Note (Signed)
07/12/2021  12:31 PM  PATIENT:  [name]  75 y.o. female  PRE-OPERATIVE DIAGNOSIS:  right proximal humerus fracture malunion  POST-OPERATIVE DIAGNOSIS:  right proximal humerus fracture malunion  PROCEDURE:  Procedure(s) with comments: REVERSE SHOULDER ARTHROPLASTY (Right) - with ISB DePuy Delta Xtend with tuberosity repair  SURGEON:  Surgeon(s) and Role:    [name], MD - Primary  PHYSICIAN ASSISTANT:   ASSISTANTS: Ventura Bruns, PA-C   ANESTHESIA:   regional and general  EBL:  200 mL   BLOOD ADMINISTERED:none  DRAINS: none   LOCAL MEDICATIONS USED:  MARCAINE     SPECIMEN:  No Specimen  DISPOSITION OF SPECIMEN:  N/A  COUNTS:  YES  TOURNIQUET:  * No tourniquets in log *  DICTATION: .Other Dictation: Dictation Number [id number]  PLAN OF CARE: Admit for overnight observation  PATIENT DISPOSITION:  PACU - hemodynamically stable.   Delay start of Pharmacological VTE agent (>24hrs) due to surgical blood loss or risk of bleeding: not applicable

## 2021-07-12 NOTE — Interval H&P Note (Signed)
History and Physical Interval Note:  07/12/2021 9:36 AM  [name]  has presented today for surgery, with the diagnosis of right proximal humerus fracture.  The various methods of treatment have been discussed with the patient and family. After consideration of risks, benefits and other options for treatment, the patient has consented to  Procedure(s) with comments: REVERSE SHOULDER ARTHROPLASTY (Right) - with ISB as a surgical intervention.  The patient's history has been reviewed, patient examined, no change in status, stable for surgery.  I have reviewed the patient's chart and labs.  Questions were answered to the patient's satisfaction.     [name]

## 2021-07-12 NOTE — Discharge Instructions (Signed)
Ice to the shoulder constantly.  Keep the incision covered and clean and dry for one week, then ok to get it wet in the shower.  Do very gentle exercise as instructed several times per day to keep your arm circulation intact.  GO SLOW and BE CAREFUL!  DO NOT reach behind your back or push up out of a chair with the operative arm.  Use a sling while you are up and around for comfort, may remove while seated.  Keep pillow propped behind the operative elbow.  Follow up with Dr Veverly Fells in two weeks in the office, call [phone] for appt

## 2021-07-13 DIAGNOSIS — I10 Essential (primary) hypertension: Secondary | ICD-10-CM | POA: Diagnosis not present

## 2021-07-13 DIAGNOSIS — Z79899 Other long term (current) drug therapy: Secondary | ICD-10-CM | POA: Diagnosis not present

## 2021-07-13 DIAGNOSIS — I4891 Unspecified atrial fibrillation: Secondary | ICD-10-CM | POA: Diagnosis not present

## 2021-07-13 DIAGNOSIS — Z96612 Presence of left artificial shoulder joint: Secondary | ICD-10-CM | POA: Diagnosis not present

## 2021-07-13 DIAGNOSIS — Z96651 Presence of right artificial knee joint: Secondary | ICD-10-CM | POA: Diagnosis not present

## 2021-07-13 DIAGNOSIS — S42291P Other displaced fracture of upper end of right humerus, subsequent encounter for fracture with malunion: Secondary | ICD-10-CM | POA: Diagnosis not present

## 2021-07-13 DIAGNOSIS — Z91040 Latex allergy status: Secondary | ICD-10-CM | POA: Diagnosis not present

## 2021-07-13 DIAGNOSIS — M19011 Primary osteoarthritis, right shoulder: Secondary | ICD-10-CM | POA: Diagnosis not present

## 2021-07-13 DIAGNOSIS — Z20822 Contact with and (suspected) exposure to covid-19: Secondary | ICD-10-CM | POA: Diagnosis not present

## 2021-07-13 DIAGNOSIS — E039 Hypothyroidism, unspecified: Secondary | ICD-10-CM | POA: Diagnosis not present

## 2021-07-13 LAB — BASIC METABOLIC PANEL
Anion gap: 6 (ref 5–15)
BUN: 14 mg/dL (ref 8–23)
CO2: 26 mmol/L (ref 22–32)
Calcium: 9.1 mg/dL (ref 8.9–10.3)
Chloride: 104 mmol/L (ref 98–111)
Creatinine, Ser: 0.77 mg/dL (ref 0.44–1.00)
GFR, Estimated: >60 mL/min (ref >60)
Glucose, Bld: 139 mg/dL — ABNORMAL HIGH (ref 70–99)
Potassium: 4.5 mmol/L (ref 3.5–5.1)
Sodium: 136 mmol/L (ref 135–145)

## 2021-07-13 LAB — HEMOGLOBIN AND HEMATOCRIT, BLOOD
HCT: 41.2 % (ref 36.0–46.0)
Hemoglobin: 13.3 g/dL (ref 12.0–15.0)

## 2021-07-13 NOTE — Evaluation (Signed)
Occupational Therapy Evaluation Patient Details Name: [name] MRN: [id number] DOB: [birth year] Today's Date: 07/13/2021    History of Present Illness Right shoulder proximal humerus fracture with malunion s/p right reverse shoulder replacement with tuberosity repair. PHMx: TKR, L TSA   Clinical Impression   This 75 yo female admitted with above presents to acute OT with PLOF of being Mod I to independent with all basic ADLs. Currently she is setup-min A for all basic ADLs. She will continue to benefit from acute OT for one more session to focus on dressing.    Follow Up Recommendations  Follow surgeon's recommendation for DC plan and follow-up therapies    Equipment Recommendations  None recommended by OT       Precautions / Restrictions Precautions Precautions: Shoulder Type of Shoulder Precautions: No AROM/PROM shoulder; E-W-H OK Shoulder Interventions: Shoulder sling/immobilizer;Off for dressing/bathing/exercises Precaution Booklet Issued: Yes (comment) Required Braces or Orthoses: Sling Restrictions Weight Bearing Restrictions: Yes RUE Weight Bearing: Non weight bearing      Mobility Bed Mobility Overal bed mobility: Needs Assistance Bed Mobility: Supine to Sit     Supine to sit: Modified independent (Device/Increase time);HOB elevated          Transfers Overall transfer level: Needs assistance Equipment used: None Transfers: Sit to/from Stand Sit to Stand: Min guard         General transfer comment: ambulation from bed to bathroom min guard A    Balance Overall balance assessment: Mild deficits observed, not formally tested                                         ADL either performed or assessed with clinical judgement   ADL Overall ADL's : Needs assistance/impaired Eating/Feeding: Set up;Sitting   Grooming: Set up;Standing   Upper Body Bathing: Minimal assistance;Standing   Lower Body Bathing: Set up;Sit to/from stand            Toilet Transfer: Min guard;Ambulation   Toileting- Clothing Manipulation and Hygiene: Independent;Sitting/lateral lean               Vision Baseline Vision/History: Wears glasses Wears Glasses: At all times Patient Visual Report: No change from baseline              Pertinent Vitals/Pain Pain Assessment: No/denies pain (block has not worn off)     Hand Dominance Right   Extremity/Trunk Assessment Upper Extremity Assessment Upper Extremity Assessment: RUE deficits/detail RUE Deficits / Details: shoulder sx this admission, currently can move hand and wrist but not forearm or elbow (block not totally worn off) RUE Coordination: decreased gross motor           Communication Communication Communication: No difficulties   Cognition Arousal/Alertness: Awake/alert Behavior During Therapy: WFL for tasks assessed/performed Overall Cognitive Status: Within Functional Limits for tasks assessed                                             Shoulder Instructions Shoulder Instructions Method for sponge bathing under operated UE: Patient able to independently direct caregiver Donning/doffing sling/immobilizer: Patient able to independently direct caregiver Correct positioning of sling/immobilizer: Patient able to independently direct caregiver Pendulum exercises (written home exercise program):  (NA) ROM for elbow, wrist and digits of operated UE: Minimal assistance (  10 reps of each, A needed for forearm and elbow due to block not totally worn off.) Sling wearing schedule (on at all times/off for ADL's): Independent Dressing change:  (NA) Positioning of UE while sleeping: Patient able to independently direct caregiver    Home Living Family/patient expects to be discharged to:: Private residence Living Arrangements: Alone Available Help at Discharge: Family;Friend(s);Available 24 hours/day (2 weeks) Type of Home: House Home Access: Stairs to  enter [employer] of Steps: 5 Entrance Stairs-Rails: Left Home Layout: One level     Bathroom Shower/Tub:  (sponge bathes)   Bathroom Toilet: Handicapped height     Home Equipment: [place] - single point;Bedside commode          Prior Functioning/Environment Level of Independence: Independent                 OT Problem List: Impaired UE functional use      OT Treatment/Interventions: Self-care/ADL training;DME and/or AE instruction;Patient/family education;Balance training    OT Goals(Current goals can be found in the care plan section) Acute Rehab OT Goals Patient Stated Goal: home today or tomorrow depending on pain OT Goal Formulation: With patient Time For Goal Achievement: 07/27/21 Potential to Achieve Goals: Good  OT Frequency: Min 2X/week    AM-PAC OT "6 Clicks" Daily Activity     Outcome Measure Help from another person eating meals?: A Little Help from another person taking care of personal grooming?: A Little Help from another person toileting, which includes using toliet, bedpan, or urinal?: A Little Help from another person bathing (including washing, rinsing, drying)?: A Little Help from another person to put on and taking off regular upper body clothing?: A Little Help from another person to put on and taking off regular lower body clothing?: A Little 6 Click Score: 18   End of Session Equipment Utilized During Treatment: Other (comment) (sling) Nurse Communication: Mobility status (IV bloody)  Activity Tolerance: Patient tolerated treatment well Patient left: in chair;with call bell/phone within reach;with chair alarm set  OT Visit Diagnosis: Unsteadiness on feet (R26.81);Other abnormalities of gait and mobility (R26.89);Muscle weakness (generalized) (M62.81)                Time: [id number] OT Time Calculation (min): 43 min Charges:  OT General Charges $OT Visit: 1 Visit OT Evaluation $OT Eval Moderate Complexity: 1 Mod OT  Treatments $Self Care/Home Management : 23-37 mins  Golden Circle, OTR/L Acute [employer] Pager [phone] Office [phone]    Almon Register 07/13/2021, 8:34 AM

## 2021-07-13 NOTE — Progress Notes (Signed)
Occupational Therapy Treatment and Discharge Patient Details Name: [name] MRN: [id number] DOB: 24-Nov-1946 Today's Date: 07/13/2021    History of present illness Right shoulder proximal humerus fracture with malunion s/p right reverse shoulder replacement with tuberosity repair. PHMx: TKR, L TSA   OT comments  This 75 yo female seen for education on dressing. Pt educated and either returned demonstrated or verbally able to talk through how she suppose to get dressed/undressed  (specifically UB). No further acute OT needs, we will D/C.  Follow Up Recommendations  Follow surgeon's recommendation for DC plan and follow-up therapies    Equipment Recommendations  None recommended by OT       Precautions / Restrictions Precautions Precautions: Shoulder Type of Shoulder Precautions: No AROM/PROM shoulder; E-W-H OK Precaution Booklet Issued: Yes (comment) Required Braces or Orthoses: Sling Restrictions Weight Bearing Restrictions: Yes RUE Weight Bearing: Non weight bearing       Mobility Bed Mobility               General bed mobility comments: up in recliner    Transfers Overall transfer level: Modified independent Equipment used: None Transfers: Sit to/from Stand Sit to Stand: Modified independent (Device/Increase time)                  ADL either performed or assessed with clinical judgement   ADL Overall ADL's : Needs assistance/impaired                 Upper Body Dressing : Minimal assistance;Standing Upper Body Dressing Details (indicate cue type and reason): gown (RUE in first, then head,then LUE with instructions to reverse process with taking gown off Lower Body Dressing: Modified independent;Sit to/from stand   Toilet Transfer: Supervision/safety;Ambulation Toilet Transfer Details (indicate cue type and reason): No AD Toileting- Clothing Manipulation and Hygiene: Modified independent;Sit to/from stand               Vision  Baseline Vision/History: Wears glasses Wears Glasses: At all times Patient Visual Report: No change from baseline            Cognition Arousal/Alertness: Awake/alert Behavior During Therapy: WFL for tasks assessed/performed Overall Cognitive Status: Within Functional Limits for tasks assessed                                             Shoulder Instructions Shoulder Instructions Donning/doffing shirt without moving shoulder: Minimal assistance ROM for elbow, wrist and digits of operated UE: Supervision/safety (block now worn off so she can do her own forearm and elbow exercises)          Pertinent Vitals/ Pain       Pain Assessment: Faces Faces Pain Scale: Hurts little more Pain Location: right shoulder Pain Descriptors / Indicators: Aching;Sore Pain Intervention(s): Limited activity within patient's tolerance;Monitored during session;Premedicated before session         Frequency  Min 2X/week        Progress Toward Goals  OT Goals(current goals can now be found in the care plan section)  Progress towards OT goals: Goals met/education completed, patient discharged from OT  Acute Rehab OT Goals Patient Stated Goal: home today OT Goal Formulation: With patient Time For Goal Achievement: 07/27/21 Potential to Achieve Goals: Good  Plan All goals met and education completed, patient discharged from OT services       AM-PAC OT "6 Clicks"  Daily Activity     Outcome Measure   Help from another person eating meals?: A Little Help from another person taking care of personal grooming?: None Help from another person toileting, which includes using toliet, bedpan, or urinal?: None Help from another person bathing (including washing, rinsing, drying)?: A Little Help from another person to put on and taking off regular upper body clothing?: A Little Help from another person to put on and taking off regular lower body clothing?: None 6 Click Score: 21     End of Session Equipment Utilized During Treatment: Other (comment) (sling)  OT Visit Diagnosis: Unsteadiness on feet (R26.81);Other abnormalities of gait and mobility (R26.89);Muscle weakness (generalized) (M62.81);Pain Pain - Right/Left: Right Pain - part of body: Shoulder   Activity Tolerance Patient tolerated treatment well   Patient Left  (in [place] heading out of the hospital)   Nurse Communication  (Pt ready to go)        Time: 5075-7322 OT Time Calculation (min): 18 min  Charges: OT General Charges $OT Visit: 1 Visit OT Treatments $Self Care/Home Management : 8-22 mins Golden Circle, OTR/L Acute [employer] Pager [phone] Office [phone]     Almon Register 07/13/2021, 12:44 PM

## 2021-07-13 NOTE — Plan of Care (Signed)
  Problem: Pain Management: Goal: Pain level will decrease with appropriate interventions Outcome: Progressing   Problem: Education: Goal: Knowledge of General Education information will improve Description: Including pain rating scale, medication(s)/side effects and non-pharmacologic comfort measures Outcome: Progressing   Problem: Clinical Measurements: Goal: Respiratory complications will improve Outcome: Progressing   Problem: Clinical Measurements: Goal: Cardiovascular complication will be avoided Outcome: Progressing   Problem: Elimination: Goal: Will not experience complications related to bowel motility Outcome: Progressing

## 2021-07-13 NOTE — Plan of Care (Signed)
Pt stable at time of d/c instructions and education. No needs at time of d/c instructions. Pt shoulder dressing clean, dry, and intact.

## 2021-07-13 NOTE — Progress Notes (Addendum)
Subjective: 1 Day Post-Op Procedure(s) (LRB): REVERSE SHOULDER ARTHROPLASTY (Right) Patient reports pain as 2 on 0-10 scale.   Denies CP or SOB.  Voiding without difficulty. Positive flatus. Objective: Vital signs in last 24 hours: Temp:  [97.6 F (36.4 C)-98.6 F (37 C)] 97.8 F (36.6 C) (08/20 0453) Pulse Rate:  [60-85] 85 (08/20 0453) Resp:  [9-20] 16 (08/20 0453) BP: (102-141)/(53-90) 139/67 (08/20 0453) SpO2:  [91 %-100 %] 93 % (08/20 0453)  Intake/Output from previous day: 08/19 0701 - 08/20 0700 In: 2629.3 [P.O.:760; I.V.:1419; IV Piggyback:450.3] Out: 200 [Blood:200] Intake/Output this shift: Total I/O In: 240 [P.O.:240] Out: -   Recent Labs    07/13/21 0304  HGB 13.3   Recent Labs    07/13/21 0304  HCT 41.2   Recent Labs    07/13/21 0304  NA 136  K 4.5  CL 104  CO2 26  BUN 14  CREATININE 0.77  GLUCOSE 139*  CALCIUM 9.1   No results for input(s): LABPT, INR in the last 72 hours.  Sensation intact distally Intact pulses distally Dorsiflexion/Plantar flexion intact Incision: scant drainage Block still partially in effect  Assessment/Plan:  1 Day Post-Op Procedure(s) (LRB): REVERSE SHOULDER ARTHROPLASTY (Right) Doing well Advance diet Up with therapy Discharge home with home health Dressing supplies home   Active Problems:   S/P shoulder replacement, right      [name] 07/13/2021, '@NOW'$ 

## 2021-07-15 ENCOUNTER — Encounter (HOSPITAL_COMMUNITY): Payer: Self-pay | Admitting: Orthopedic Surgery

## 2021-07-16 ENCOUNTER — Ambulatory Visit: Payer: Medicare Other | Admitting: Cardiology

## 2021-07-24 DIAGNOSIS — M25511 Pain in right shoulder: Secondary | ICD-10-CM | POA: Diagnosis not present

## 2021-07-24 DIAGNOSIS — E039 Hypothyroidism, unspecified: Secondary | ICD-10-CM | POA: Diagnosis not present

## 2021-07-24 DIAGNOSIS — I4891 Unspecified atrial fibrillation: Secondary | ICD-10-CM | POA: Diagnosis not present

## 2021-07-24 DIAGNOSIS — Z299 Encounter for prophylactic measures, unspecified: Secondary | ICD-10-CM | POA: Diagnosis not present

## 2021-07-24 NOTE — Discharge Summary (Signed)
In most cases prophylactic antibiotics for Dental procdeures after total joint surgery are not necessary.  Exceptions are as follows:  1. History of prior total joint infection  2. Severely immunocompromised (Organ Transplant, cancer chemotherapy, Rheumatoid biologic meds such as [place])  3. Poorly controlled diabetes (A1C &gt; 8.0, blood glucose over 200)  If you have one of these conditions, contact your surgeon for an antibiotic prescription, prior to your dental procedure. Orthopedic Discharge Summary        Physician Discharge Summary  Patient ID: [name] MRN: [id number] DOB/AGE: 05/31/1946 75 y.o.  Admit date: 07/12/2021 Discharge date: 07/13/21  Procedures:  Procedure(s) (LRB): REVERSE SHOULDER ARTHROPLASTY (Right)  Attending Physician:  Dr. Esmond Plants  Admission Diagnoses:   right shoulder rotator cuff arthropathy  Discharge Diagnoses:  right shoulder cuff arthropathy   Past Medical History:  Diagnosis Date   Anxiety    Arthritis    Atrial fibrillation [facility])    Documented December 2019   Depression    GERD (gastroesophageal reflux disease)    Heart murmur    Hyperlipidemia    Hypertension    Hypothyroidism     PCP: [name], MD   Discharged Condition: good  Hospital Course:  Patient underwent the above stated procedure on 07/12/2021. Patient tolerated the procedure well and brought to the recovery room in good condition and subsequently to the floor. Patient had an uncomplicated hospital course and was stable for discharge.   Disposition: Discharge disposition: 01-Home or Self Care      with follow up in 2 weeks    Follow-up Information     [name], MD. Call in 2 week(s).   Specialty: Orthopedic Surgery Why: call [phone] for appt Contact information: [address] [place] [address] [id number]                 Dental Antibiotics:  In most cases prophylactic antibiotics for  Dental procdeures after total joint surgery are not necessary.  Exceptions are as follows:  1. History of prior total joint infection  2. Severely immunocompromised (Organ Transplant, cancer chemotherapy, Rheumatoid biologic meds such as [place])  3. Poorly controlled diabetes (A1C &gt; 8.0, blood glucose over 200)  If you have one of these conditions, contact your surgeon for an antibiotic prescription, prior to your dental procedure.  Discharge Instructions     Call MD / Call 911   Complete by: As directed    If you experience chest pain or shortness of breath, CALL 911 and be transported to the hospital emergency room.  If you develope a fever above 101 F, pus (white drainage) or increased drainage or redness at the wound, or calf pain, call your surgeon's office.   Constipation Prevention   Complete by: As directed    Drink plenty of fluids.  Prune juice may be helpful.  You may use a stool softener, such as Colace (over the counter) 100 mg twice a day.  Use MiraLax (over the counter) for constipation as needed.   Diet - low sodium heart healthy   Complete by: As directed    Discharge instructions   Complete by: As directed    Change dressing in 5 days   Increase activity slowly as tolerated   Complete by: As directed    Post-operative opioid taper instructions:   Complete by: As directed    POST-OPERATIVE OPIOID TAPER INSTRUCTIONS: It is important to wean off of your opioid medication as soon as  possible. If you do not need pain medication after your surgery it is ok to stop day one. Opioids include: Codeine, Hydrocodone(Norco, Vicodin), Oxycodone(Percocet, oxycontin) and hydromorphone amongst others.  Long term and even short term use of opiods can cause: Increased pain response Dependence Constipation Depression Respiratory depression And more.  Withdrawal symptoms can include Flu like symptoms Nausea, vomiting And more Techniques to manage these symptoms Hydrate  well Eat regular healthy meals Stay active Use relaxation techniques(deep breathing, meditating, yoga) Do Not substitute Alcohol to help with tapering If you have been on opioids for less than two weeks and do not have pain than it is ok to stop all together.  Plan to wean off of opioids This plan should start within one week post op of your joint replacement. Maintain the same interval or time between taking each dose and first decrease the dose.  Cut the total daily intake of opioids by one tablet each day Next start to increase the time between doses. The last dose that should be eliminated is the evening dose.          Allergies as of 07/13/2021       Reactions   Other Nausea And Vomiting   Opiate based pain medicines    Latex Rash   Penicillins Rash, Other (See Comments)   Has patient had a PCN reaction causing immediate rash, facial/tongue/throat swelling, SOB or lightheadedness with hypotension: #  #  #  YES  #  #  #  Has patient had a PCN reaction causing severe rash involving mucus membranes or skin necrosis: No Has patient had a PCN reaction that required hospitalization Yes Has patient had a PCN reaction occurring within the last 10 years: No If all of the above answers are "NO", then may proceed with Cephalosporin use.   Sulfa Antibiotics Rash   Tramadol Itching        Medication List     STOP taking these medications    Psyllium 63 % Powd       TAKE these medications    acetaminophen 500 MG tablet Commonly known as: TYLENOL Take 1,000 mg by mouth every 6 (six) hours as needed (pain.).   apixaban 5 MG Tabs tablet Commonly known as: ELIQUIS Take 1 tablet (5 mg total) by mouth 2 (two) times daily.   CALCIUM 1200+D3 PO Take 1 tablet by mouth daily.   citalopram 40 MG tablet Commonly known as: CELEXA Take 40 mg by mouth daily.   diltiazem 240 MG 24 hr capsule Commonly known as: CARDIZEM CD Take 240 mg by mouth daily.    HYDROcodone-acetaminophen 5-325 MG tablet Commonly known as: Norco Take 1-2 tablets by mouth every 6 (six) hours as needed for moderate pain or severe pain.   levothyroxine 75 MCG tablet Commonly known as: SYNTHROID Take 75 mcg by mouth daily before breakfast.   MAGNESIUM PO Take 266 mg by mouth daily.   methocarbamol 500 MG tablet Commonly known as: Robaxin Take 1 tablet (500 mg total) by mouth every 8 (eight) hours as needed for muscle spasms.   multivitamin with minerals tablet Take 1 tablet by mouth daily. One A Day for Women   ondansetron 4 MG tablet Commonly known as: Zofran Take 1 tablet (4 mg total) by mouth daily as needed for nausea or vomiting.   polyethylene glycol 17 g packet Commonly known as: MIRALAX / GLYCOLAX Take 17 g by mouth daily as needed for mild constipation.   Probiotic Digestive Support Caps  Take 2 capsules by mouth daily.   simvastatin 10 MG tablet Commonly known as: ZOCOR Take 10 mg by mouth daily.   spironolactone 25 MG tablet Commonly known as: ALDACTONE TAKE 1 TABLET BY MOUTH  DAILY   Vitamin D3 25 MCG tablet Commonly known as: Vitamin D Take 2,000 Units by mouth daily.          Signed: Ventura Bruns 07/24/2021, 8:03 AM  [facility] is now [employer] [address], [place], [place], [place] [address] Phone: Nellie

## 2021-07-25 DIAGNOSIS — Z4789 Encounter for other orthopedic aftercare: Secondary | ICD-10-CM | POA: Diagnosis not present

## 2021-08-27 DIAGNOSIS — Z4789 Encounter for other orthopedic aftercare: Secondary | ICD-10-CM | POA: Diagnosis not present

## 2021-09-05 DIAGNOSIS — M25511 Pain in right shoulder: Secondary | ICD-10-CM | POA: Diagnosis not present

## 2021-09-05 DIAGNOSIS — M6281 Muscle weakness (generalized): Secondary | ICD-10-CM | POA: Diagnosis not present

## 2021-09-05 DIAGNOSIS — Z471 Aftercare following joint replacement surgery: Secondary | ICD-10-CM | POA: Diagnosis not present

## 2021-09-05 DIAGNOSIS — Z96611 Presence of right artificial shoulder joint: Secondary | ICD-10-CM | POA: Diagnosis not present

## 2021-09-05 DIAGNOSIS — M25611 Stiffness of right shoulder, not elsewhere classified: Secondary | ICD-10-CM | POA: Diagnosis not present

## 2021-09-10 DIAGNOSIS — Z96611 Presence of right artificial shoulder joint: Secondary | ICD-10-CM | POA: Diagnosis not present

## 2021-09-10 DIAGNOSIS — M25611 Stiffness of right shoulder, not elsewhere classified: Secondary | ICD-10-CM | POA: Diagnosis not present

## 2021-09-10 DIAGNOSIS — M6281 Muscle weakness (generalized): Secondary | ICD-10-CM | POA: Diagnosis not present

## 2021-09-10 DIAGNOSIS — Z471 Aftercare following joint replacement surgery: Secondary | ICD-10-CM | POA: Diagnosis not present

## 2021-09-10 DIAGNOSIS — M25511 Pain in right shoulder: Secondary | ICD-10-CM | POA: Diagnosis not present

## 2021-09-12 DIAGNOSIS — M6281 Muscle weakness (generalized): Secondary | ICD-10-CM | POA: Diagnosis not present

## 2021-09-12 DIAGNOSIS — Z471 Aftercare following joint replacement surgery: Secondary | ICD-10-CM | POA: Diagnosis not present

## 2021-09-12 DIAGNOSIS — M25611 Stiffness of right shoulder, not elsewhere classified: Secondary | ICD-10-CM | POA: Diagnosis not present

## 2021-09-12 DIAGNOSIS — Z96611 Presence of right artificial shoulder joint: Secondary | ICD-10-CM | POA: Diagnosis not present

## 2021-09-12 DIAGNOSIS — M25511 Pain in right shoulder: Secondary | ICD-10-CM | POA: Diagnosis not present

## 2021-09-16 DIAGNOSIS — M25511 Pain in right shoulder: Secondary | ICD-10-CM | POA: Diagnosis not present

## 2021-09-16 DIAGNOSIS — M25611 Stiffness of right shoulder, not elsewhere classified: Secondary | ICD-10-CM | POA: Diagnosis not present

## 2021-09-16 DIAGNOSIS — M6281 Muscle weakness (generalized): Secondary | ICD-10-CM | POA: Diagnosis not present

## 2021-09-16 DIAGNOSIS — Z96611 Presence of right artificial shoulder joint: Secondary | ICD-10-CM | POA: Diagnosis not present

## 2021-09-16 DIAGNOSIS — Z471 Aftercare following joint replacement surgery: Secondary | ICD-10-CM | POA: Diagnosis not present

## 2021-09-19 DIAGNOSIS — M25511 Pain in right shoulder: Secondary | ICD-10-CM | POA: Diagnosis not present

## 2021-09-19 DIAGNOSIS — M25611 Stiffness of right shoulder, not elsewhere classified: Secondary | ICD-10-CM | POA: Diagnosis not present

## 2021-09-19 DIAGNOSIS — Z471 Aftercare following joint replacement surgery: Secondary | ICD-10-CM | POA: Diagnosis not present

## 2021-09-19 DIAGNOSIS — M6281 Muscle weakness (generalized): Secondary | ICD-10-CM | POA: Diagnosis not present

## 2021-09-19 DIAGNOSIS — Z96611 Presence of right artificial shoulder joint: Secondary | ICD-10-CM | POA: Diagnosis not present

## 2021-09-23 DIAGNOSIS — M25511 Pain in right shoulder: Secondary | ICD-10-CM | POA: Diagnosis not present

## 2021-09-23 DIAGNOSIS — M6281 Muscle weakness (generalized): Secondary | ICD-10-CM | POA: Diagnosis not present

## 2021-09-23 DIAGNOSIS — Z96611 Presence of right artificial shoulder joint: Secondary | ICD-10-CM | POA: Diagnosis not present

## 2021-09-23 DIAGNOSIS — M25611 Stiffness of right shoulder, not elsewhere classified: Secondary | ICD-10-CM | POA: Diagnosis not present

## 2021-09-23 DIAGNOSIS — Z471 Aftercare following joint replacement surgery: Secondary | ICD-10-CM | POA: Diagnosis not present

## 2021-09-25 DIAGNOSIS — Z4789 Encounter for other orthopedic aftercare: Secondary | ICD-10-CM | POA: Diagnosis not present

## 2021-09-26 DIAGNOSIS — M25611 Stiffness of right shoulder, not elsewhere classified: Secondary | ICD-10-CM | POA: Diagnosis not present

## 2021-09-26 DIAGNOSIS — M6281 Muscle weakness (generalized): Secondary | ICD-10-CM | POA: Diagnosis not present

## 2021-09-26 DIAGNOSIS — Z471 Aftercare following joint replacement surgery: Secondary | ICD-10-CM | POA: Diagnosis not present

## 2021-09-26 DIAGNOSIS — M25511 Pain in right shoulder: Secondary | ICD-10-CM | POA: Diagnosis not present

## 2021-09-26 DIAGNOSIS — Z96611 Presence of right artificial shoulder joint: Secondary | ICD-10-CM | POA: Diagnosis not present

## 2021-09-30 DIAGNOSIS — R5383 Other fatigue: Secondary | ICD-10-CM | POA: Diagnosis not present

## 2021-09-30 DIAGNOSIS — Z299 Encounter for prophylactic measures, unspecified: Secondary | ICD-10-CM | POA: Diagnosis not present

## 2021-09-30 DIAGNOSIS — Z2821 Immunization not carried out because of patient refusal: Secondary | ICD-10-CM | POA: Diagnosis not present

## 2021-09-30 DIAGNOSIS — I4891 Unspecified atrial fibrillation: Secondary | ICD-10-CM | POA: Diagnosis not present

## 2021-09-30 DIAGNOSIS — I10 Essential (primary) hypertension: Secondary | ICD-10-CM | POA: Diagnosis not present

## 2021-09-30 DIAGNOSIS — K625 Hemorrhage of anus and rectum: Secondary | ICD-10-CM | POA: Diagnosis not present

## 2021-10-01 DIAGNOSIS — M25511 Pain in right shoulder: Secondary | ICD-10-CM | POA: Diagnosis not present

## 2021-10-01 DIAGNOSIS — Z471 Aftercare following joint replacement surgery: Secondary | ICD-10-CM | POA: Diagnosis not present

## 2021-10-01 DIAGNOSIS — Z96611 Presence of right artificial shoulder joint: Secondary | ICD-10-CM | POA: Diagnosis not present

## 2021-10-01 DIAGNOSIS — M25611 Stiffness of right shoulder, not elsewhere classified: Secondary | ICD-10-CM | POA: Diagnosis not present

## 2021-10-01 DIAGNOSIS — M6281 Muscle weakness (generalized): Secondary | ICD-10-CM | POA: Diagnosis not present

## 2021-10-03 DIAGNOSIS — M6281 Muscle weakness (generalized): Secondary | ICD-10-CM | POA: Diagnosis not present

## 2021-10-03 DIAGNOSIS — M25611 Stiffness of right shoulder, not elsewhere classified: Secondary | ICD-10-CM | POA: Diagnosis not present

## 2021-10-03 DIAGNOSIS — Z471 Aftercare following joint replacement surgery: Secondary | ICD-10-CM | POA: Diagnosis not present

## 2021-10-03 DIAGNOSIS — M25511 Pain in right shoulder: Secondary | ICD-10-CM | POA: Diagnosis not present

## 2021-10-03 DIAGNOSIS — Z96611 Presence of right artificial shoulder joint: Secondary | ICD-10-CM | POA: Diagnosis not present

## 2021-10-08 DIAGNOSIS — M25511 Pain in right shoulder: Secondary | ICD-10-CM | POA: Diagnosis not present

## 2021-10-08 DIAGNOSIS — Z96611 Presence of right artificial shoulder joint: Secondary | ICD-10-CM | POA: Diagnosis not present

## 2021-10-08 DIAGNOSIS — Z471 Aftercare following joint replacement surgery: Secondary | ICD-10-CM | POA: Diagnosis not present

## 2021-10-08 DIAGNOSIS — M25611 Stiffness of right shoulder, not elsewhere classified: Secondary | ICD-10-CM | POA: Diagnosis not present

## 2021-10-08 DIAGNOSIS — M6281 Muscle weakness (generalized): Secondary | ICD-10-CM | POA: Diagnosis not present

## 2021-10-10 DIAGNOSIS — M25611 Stiffness of right shoulder, not elsewhere classified: Secondary | ICD-10-CM | POA: Diagnosis not present

## 2021-10-10 DIAGNOSIS — Z471 Aftercare following joint replacement surgery: Secondary | ICD-10-CM | POA: Diagnosis not present

## 2021-10-10 DIAGNOSIS — M25511 Pain in right shoulder: Secondary | ICD-10-CM | POA: Diagnosis not present

## 2021-10-10 DIAGNOSIS — M6281 Muscle weakness (generalized): Secondary | ICD-10-CM | POA: Diagnosis not present

## 2021-10-10 DIAGNOSIS — Z96611 Presence of right artificial shoulder joint: Secondary | ICD-10-CM | POA: Diagnosis not present

## 2021-10-11 ENCOUNTER — Telehealth: Payer: Self-pay | Admitting: Cardiology

## 2021-10-11 DIAGNOSIS — K922 Gastrointestinal hemorrhage, unspecified: Secondary | ICD-10-CM

## 2021-10-11 NOTE — Telephone Encounter (Signed)
Pt stated she is not sure if her bleeding is coming from her Eliqiuis. Pt has a hemorrhoid and states that it is fairly large. Pt would like to know if she still needs to continue taking Eliquis. Pt has appt with [name] on 10/14/21.  Please advise.

## 2021-10-11 NOTE — Telephone Encounter (Signed)
Pt stated that she had called her PCP who told her it would be best to seek Cardiology's advice. Pt also stated she just had lab work completed and was advised to bring that with her to her f/u appt Monday.   Pt see Dr. Laural Golden, GI- sent referral for assessment of GI Bleed for faster appt.   Pt agreeable to hold Eliquis until she is seen Monday.

## 2021-10-11 NOTE — Telephone Encounter (Signed)
Pt c/o medication issue:  1. Name of Medication:  apixaban (ELIQUIS) 5 MG TABS tablet  2. How are you currently taking this medication (dosage and times per day)? As prescribed   3. Are you having a reaction (difficulty breathing--STAT)? Yes   4. What is your medication issue? [name] is calling stating she has noticed blood in her urine for several months when using the bathroom that is coming from her rectum. She states she believes it is a hemorrhoid. This morning she woke up to a spot of blood in her bed half the size of her hand. She wanted to report this prior to coming in Monday due to her being on Eliquis. Please advise.

## 2021-10-13 NOTE — Progress Notes (Signed)
Cardiology Office Note  Date: 10/14/2021   ID: Roberts, Deborah May 24, 1946, MRN [id number]  PCP:  Deborah Chroman, MD  Cardiologist:  Rozann Lesches, MD Electrophysiologist:  None   Chief Complaint  Patient presents with   Cardiac follow-up    History of Present Illness: [name] is a 75 y.o. female last seen in February.  She presents for a follow-up visit. She reports recent rectal bleeding, also indicates that she has a fairly large hemorrhoid.  We had her stop Eliquis on Friday pending today's visit and also made referral to GI.  She has follow-up with Dr. Laural Roberts over time.  She tells me that she usually notices some blood in the toilet bowl when she has a bowel movement.  The episode recently was a spot of blood she noted on her sheet when she got up in the morning.  She also indicates an episode of palpitations a few weeks ago consistent with atrial fibrillation.  Heart rate is regular today.  I reviewed her recent lab work obtained by Dr. Woody Roberts.  Past Medical History:  Diagnosis Date   Anxiety    Arthritis    Atrial fibrillation [facility])    Documented December 2019   Depression    GERD (gastroesophageal reflux disease)    Heart murmur    Hyperlipidemia    Hypertension    Hypothyroidism     Past Surgical History:  Procedure Laterality Date   APPENDECTOMY     BUNIONECTOMY Bilateral    CATARACT EXTRACTION W/PHACO Right 10/09/2017   Procedure: CATARACT EXTRACTION PHACO AND INTRAOCULAR LENS PLACEMENT ([employer]);  Surgeon: Deborah Goldmann, MD;  [facility];  Service: Ophthalmology;  Laterality: Right;  CDE: 6.65   CATARACT EXTRACTION W/PHACO Left 12/11/2017   Procedure: CATARACT EXTRACTION PHACO AND INTRAOCULAR LENS PLACEMENT ([facility]);  Surgeon: Deborah Goldmann, MD;  Location: [facility];  Service: Ophthalmology;  Laterality: Left;  CDE: 5.28   COLONOSCOPY  06/18/2012   Procedure: COLONOSCOPY;  Surgeon: Deborah Houston, MD;  [facility];  Service:  Endoscopy;  Laterality: N/A;  730   COLONOSCOPY N/A 07/04/2019   Procedure: COLONOSCOPY;  Surgeon: Deborah Houston, MD;  [facility];  Service: Endoscopy;  Laterality: N/A;  1220   ESOPHAGOGASTRODUODENOSCOPY     Hammer toe repair Left    HEMORRHOID SURGERY     KNEE ARTHROSCOPY     left 06-2019   REVERSE SHOULDER ARTHROPLASTY Right 07/12/2021   Procedure: REVERSE SHOULDER ARTHROPLASTY;  Surgeon: Deborah Cedars, MD;  [facility];  Service: Orthopedics;  Laterality: Right;  with ISB   SHOULDER ARTHROSCOPY     right   THYROIDECTOMY     TONSILLECTOMY     TOTAL ABDOMINAL HYSTERECTOMY     TOTAL KNEE ARTHROPLASTY Right 06/29/2020   Procedure: TOTAL KNEE ARTHROPLASTY;  Surgeon: Deborah Cedars, MD;  [facility];  Service: Orthopedics;  Laterality: Right;  adductor canal   TOTAL SHOULDER ARTHROPLASTY Left 02/06/2017   TOTAL SHOULDER ARTHROPLASTY Left 02/06/2017   Procedure: TOTAL SHOULDER ARTHROPLASTY;  Surgeon: Deborah Cedars, MD;  Location: [place];  Service: Orthopedics;  Laterality: Left;    Current Outpatient Medications  Medication Sig Dispense Refill   acetaminophen (TYLENOL) 500 MG tablet Take 1,000 mg by mouth every 6 (six) hours as needed (pain.).      Calcium-Magnesium-Vitamin D (CALCIUM 1200+D3 PO) Take 1 tablet by mouth daily.     citalopram (CELEXA) 40 MG tablet Take 40 mg by mouth  daily.     diltiazem (CARDIZEM CD) 240 MG 24 hr capsule Take 240 mg by mouth daily.     HYDROcodone-acetaminophen (NORCO) 5-325 MG tablet Take 1-2 tablets by mouth every 6 (six) hours as needed for moderate pain or severe pain. 30 tablet 0   Lactobacillus-Inulin (PROBIOTIC DIGESTIVE SUPPORT) CAPS Take 2 capsules by mouth daily.     levothyroxine (SYNTHROID, LEVOTHROID) 75 MCG tablet Take 75 mcg by mouth daily before breakfast.      MAGNESIUM PO Take 266 mg by mouth daily.     methocarbamol (ROBAXIN) 500 MG tablet Take 1 tablet (500 mg total) by mouth every 8 (eight) hours as needed for muscle  spasms. 60 tablet 1   metoprolol tartrate (LOPRESSOR) 25 MG tablet Take 25 mg by mouth daily as needed.     Multiple Vitamins-Minerals (MULTIVITAMIN WITH MINERALS) tablet Take 1 tablet by mouth daily. One A Day for Women     ondansetron (ZOFRAN) 4 MG tablet Take 1 tablet (4 mg total) by mouth daily as needed for nausea or vomiting. 30 tablet 1   polyethylene glycol (MIRALAX / GLYCOLAX) packet Take 17 g by mouth daily as needed for mild constipation.      simvastatin (ZOCOR) 10 MG tablet Take 10 mg by mouth daily.     spironolactone (ALDACTONE) 25 MG tablet TAKE 1 TABLET BY MOUTH  DAILY 90 tablet 1   Vitamin D3 (VITAMIN D) 25 MCG tablet Take 2,000 Units by mouth daily.     apixaban (ELIQUIS) 5 MG TABS tablet Take 1 tablet (5 mg total) by mouth 2 (two) times daily. 56 tablet 0   No current facility-administered medications for this visit.   Allergies:  Other, Latex, Penicillins, Sulfa antibiotics, and Tramadol   ROS: No chest pain, dizziness, or syncope.  Physical Exam: VS:  BP (!) 144/88   Pulse 80   Ht 5' 2.5" (1.588 m)   Wt 179 lb (81.2 kg)   SpO2 97%   BMI 32.22 kg/m , BMI Body mass index is 32.22 kg/m.  Wt Readings from Last 3 Encounters:  10/14/21 179 lb (81.2 kg)  07/12/21 176 lb 12.8 oz (80.2 kg)  07/03/21 176 lb 12.8 oz (80.2 kg)    General: Patient appears comfortable at rest. HEENT: Conjunctiva and lids normal, wearing a mask. Neck: Supple, no elevated JVP or carotid bruits, no thyromegaly. Lungs: Clear to auscultation, nonlabored breathing at rest. Cardiac: Regular rate and rhythm, no S3 or significant systolic murmur, no pericardial rub. Extremities: No pitting edema.  ECG:  An ECG dated 01/07/2021 was personally reviewed today and demonstrated:  Sinus sinus rhythm with LVH and nonspecific T wave changes.  Recent Labwork: 07/03/2021: Platelets 286 07/13/2021: BUN 14; Creatinine, Ser 0.77; Hemoglobin 13.3; Potassium 4.5; Sodium 136     Component Value Date/Time    CHOL 117 11/15/2018 0413   TRIG 102 11/15/2018 0413   HDL 39 (L) 11/15/2018 0413   CHOLHDL 3.0 11/15/2018 0413   VLDL 20 11/15/2018 0413   LDLCALC 58 11/15/2018 0413  November 2022: Hemoglobin 16.2, platelets 315, TSH 1.57, potassium 4.7  Other Studies Reviewed Today:  Echocardiogram 11/14/2018: Study Conclusions   - Left ventricle: The cavity size was normal. Wall thickness was    increased in a pattern of moderate LVH. Systolic function was    normal. The estimated ejection fraction was in the range of 55%    to 60%. Wall motion was normal; there were no regional wall    motion  abnormalities. Features are consistent with a pseudonormal    left ventricular filling pattern, with concomitant abnormal    relaxation and increased filling pressure (grade 2 diastolic    dysfunction).   Impressions:   - Normal LV systolic function; moderate diastolic dysfunction;    moderate LVH.   Assessment and Plan:  1.  Recurrent hematochezia, presumably secondary to hemorrhoidal bleeding based on history.  She is on Eliquis for stroke prophylaxis with paroxysmal atrial fibrillation and a CHA2DS2-VASc score of 3.  I had her hold Eliquis over the weekend which she states was helpful.  Recent hemoglobin was 16.2 on evaluation by PCP.  Plan to get her referred back to GI, may be a situation where her hemorrhoids could be treated/banded.  For now we will have her go back on Eliquis.  2.  Paroxysmal atrial fibrillation with CHA2DS2-VASc score of 3.  Heart rate is regular today.  Did have an episode of palpitations approximately 2 weeks ago consistent with atrial fibrillation.  Continue Cardizem CD and as needed Lopressor.  Medication Adjustments/Labs and Tests Ordered: Current medicines are reviewed at length with the patient today.  Concerns regarding medicines are outlined above.   Tests Ordered: No orders of the defined types were placed in this encounter.   Medication Changes: No orders of the  defined types were placed in this encounter.   Disposition:  Follow up  3 months.  Signed, Satira Sark, MD, [facility] 10/14/2021 11:18 AM    [facility] [place], [place], [place] [address] Phone: [phone]; Fax: [phone]

## 2021-10-14 ENCOUNTER — Encounter: Payer: Self-pay | Admitting: Cardiology

## 2021-10-14 ENCOUNTER — Ambulatory Visit: Payer: Medicare Other | Admitting: Cardiology

## 2021-10-14 ENCOUNTER — Other Ambulatory Visit: Payer: Self-pay | Admitting: *Deleted

## 2021-10-14 VITALS — BP 144/88 | HR 80 | Ht 62.5 in | Wt 179.0 lb

## 2021-10-14 DIAGNOSIS — K921 Melena: Secondary | ICD-10-CM

## 2021-10-14 DIAGNOSIS — I480 Paroxysmal atrial fibrillation: Secondary | ICD-10-CM

## 2021-10-14 MED ORDER — APIXABAN 5 MG PO TABS: 5.0000 mg | ORAL_TABLET | Freq: Two times a day (BID) | ORAL | 0 refills | Status: DC

## 2021-10-14 NOTE — Patient Instructions (Signed)
Medication Instructions:  Your physician recommends that you continue on your current medications as directed. Please refer to the Current Medication list given to you today. Restart eliquis 5 mg twice daily  Labwork: none  Testing/Procedures: none  Follow-Up: Your physician recommends that you schedule a follow-up appointment in: 3 month  Any Other Special Instructions Will Be Listed Below (If Applicable).  If you need a refill on your cardiac medications before your next appointment, please call your pharmacy.

## 2021-10-14 NOTE — Telephone Encounter (Signed)
Per [name], give eliquis 5 mg samples

## 2021-10-15 DIAGNOSIS — Z96611 Presence of right artificial shoulder joint: Secondary | ICD-10-CM | POA: Diagnosis not present

## 2021-10-15 DIAGNOSIS — Z471 Aftercare following joint replacement surgery: Secondary | ICD-10-CM | POA: Diagnosis not present

## 2021-10-15 DIAGNOSIS — M25511 Pain in right shoulder: Secondary | ICD-10-CM | POA: Diagnosis not present

## 2021-10-15 DIAGNOSIS — M6281 Muscle weakness (generalized): Secondary | ICD-10-CM | POA: Diagnosis not present

## 2021-10-15 DIAGNOSIS — M25611 Stiffness of right shoulder, not elsewhere classified: Secondary | ICD-10-CM | POA: Diagnosis not present

## 2021-10-22 ENCOUNTER — Ambulatory Visit (INDEPENDENT_AMBULATORY_CARE_PROVIDER_SITE_OTHER): Payer: Medicare Other | Admitting: Gastroenterology

## 2021-10-22 ENCOUNTER — Encounter (INDEPENDENT_AMBULATORY_CARE_PROVIDER_SITE_OTHER): Payer: Self-pay | Admitting: Gastroenterology

## 2021-10-22 ENCOUNTER — Telehealth (INDEPENDENT_AMBULATORY_CARE_PROVIDER_SITE_OTHER): Payer: Self-pay | Admitting: Gastroenterology

## 2021-10-22 ENCOUNTER — Other Ambulatory Visit: Payer: Self-pay

## 2021-10-22 VITALS — BP 124/62 | HR 62 | Temp 97.3°F | Ht 62.5 in | Wt 181.0 lb

## 2021-10-22 DIAGNOSIS — Z96611 Presence of right artificial shoulder joint: Secondary | ICD-10-CM | POA: Diagnosis not present

## 2021-10-22 DIAGNOSIS — Z471 Aftercare following joint replacement surgery: Secondary | ICD-10-CM | POA: Diagnosis not present

## 2021-10-22 DIAGNOSIS — M25511 Pain in right shoulder: Secondary | ICD-10-CM | POA: Diagnosis not present

## 2021-10-22 DIAGNOSIS — K5900 Constipation, unspecified: Secondary | ICD-10-CM

## 2021-10-22 DIAGNOSIS — K649 Unspecified hemorrhoids: Secondary | ICD-10-CM | POA: Diagnosis not present

## 2021-10-22 DIAGNOSIS — M6281 Muscle weakness (generalized): Secondary | ICD-10-CM | POA: Diagnosis not present

## 2021-10-22 DIAGNOSIS — M25611 Stiffness of right shoulder, not elsewhere classified: Secondary | ICD-10-CM | POA: Diagnosis not present

## 2021-10-22 DIAGNOSIS — K625 Hemorrhage of anus and rectum: Secondary | ICD-10-CM | POA: Diagnosis not present

## 2021-10-22 MED ORDER — HYDROCORTISONE (PERIANAL) 2.5 % EX CREA: 1.0000 "application " | TOPICAL_CREAM | Freq: Two times a day (BID) | CUTANEOUS | 1 refills | Status: DC

## 2021-10-22 NOTE — Patient Instructions (Signed)
We will check some labs to evaluate your blood counts today. Please continue with current regimen for constipation, you can increase the miralax to 1 capful daily if needed to ensure that you are not having to strain to have a bowel movement. I am not entirely convinced that the episode of bleeding you had a few weeks ago was from your rectum, please follow up with your OB GYN for further evaluation to rule out vaginal bleeding. Please let me know if you develop any further episodes of large volume bleeding like this. I will send a cream for you to use for your hemorrhoids and refer you to Roseanne Kaufman, the nurse practitioner at [facility] down the road for possible hemorrhoid banding in the future.

## 2021-10-22 NOTE — Progress Notes (Signed)
Referring Provider: [name], MD Primary Care Physician:  [name], MD Primary GI Physician: Rehman  Chief Complaint  Patient presents with   GI Bleeding    Woke up Friday Morning covered in blood from her bottom, feels like she doesn't have any energy, having a little nausea at times. She is concerned about both hands starting to shake   HPI:   [name] is a 75 y.o. female with past medical history of anxiety, arthritis, A fib, Depression, GERD, HLD, HTN, hypothyroidism and significant history of constipation and hematochezia.   Patient presenting today for suspected rectal bleeding  Patient last seen in clinic April 2022 with hematochezia in presence of constipation and stool moving toward vaginal area. Rectovaginal fistula was not suspected at that time based on patient's presentation. She was advised to start metamucil 4g daily and continue miralax daily. She was to call back with report after having pelvic exam with GYN.   Today, she reports that upon waking up Friday morning 2 weeks ago, with a good amount of bright red blood noted in her underwear, however, there was no stool mixed in.  She denies any rectal pain.  She reports that she has continuous streaks of blood in her stools, this has been ongoing for a long time, thought secondary to hemorrhoids/constipation. She is having constipation off and on. She is doing magnesium, probiotic pills, and miralax 1 capful every other day. She is having a BM 4-5x/week. She reports that sometimes she has to strain to defecate but reports this is better than it used to be. She has also noticed some blood dripping into the commode on occasion after having a BM.  She has not had any abdominal pain, changes in appetite, or vomiting, has occasional nausea after drinking her coffee on an empty stomach, she denies any acid reflux or acid regurgitation. She reports that she has fatigue at baseline, she had an episode of a fib about 3  weeks ago. She does not have any shortness of breath, no new or worsening dizziness. She reports that she has not had followed up with her OB-GYN, she had a fall in May and had to have surgery so she was never able to follow up with them.  Last Colonoscopy:07/04/19- One 7 mm polyp in the proximal ascending colon, removed with a cold snare. Resected and retrieved. Clips (MR conditional) were placed to treat postpolypectomy bleed.(Tubular adenoma) - Diverticulosis in the sigmoid colon and at the hepatic flexure. - External hemorrhoids. Last Endoscopy:  Recommendations:  Repeat colonoscopy in 2025  Past Medical History:  Diagnosis Date   Anxiety    Arthritis    Atrial fibrillation [facility])    Documented December 2019   Depression    GERD (gastroesophageal reflux disease)    Heart murmur    Hyperlipidemia    Hypertension    Hypothyroidism     Past Surgical History:  Procedure Laterality Date   APPENDECTOMY     BUNIONECTOMY Bilateral    CATARACT EXTRACTION W/PHACO Right 10/09/2017   Procedure: CATARACT EXTRACTION PHACO AND INTRAOCULAR LENS PLACEMENT ([place]);  Surgeon: [name], MD;  [facility];  Service: Ophthalmology;  Laterality: Right;  CDE: 6.65   CATARACT EXTRACTION W/PHACO Left 12/11/2017   Procedure: CATARACT EXTRACTION PHACO AND INTRAOCULAR LENS PLACEMENT ([facility]);  Surgeon: [name], MD;  [facility];  Service: Ophthalmology;  Laterality: Left;  CDE: 5.28   COLONOSCOPY  06/18/2012   Procedure: COLONOSCOPY;  Surgeon: Bernadene Person  Gloriann Loan, MD;  [facility];  Service: Endoscopy;  Laterality: N/A;  730   COLONOSCOPY N/A 07/04/2019   Procedure: COLONOSCOPY;  Surgeon: [name], MD;  [facility];  Service: Endoscopy;  Laterality: N/A;  1220   ESOPHAGOGASTRODUODENOSCOPY     Hammer toe repair Left    HEMORRHOID SURGERY     KNEE ARTHROSCOPY     left 06-2019   REVERSE SHOULDER ARTHROPLASTY Right 07/12/2021   Procedure: REVERSE SHOULDER  ARTHROPLASTY;  Surgeon: [name], MD;  [facility];  Service: Orthopedics;  Laterality: Right;  with ISB   SHOULDER ARTHROSCOPY     right   THYROIDECTOMY     TONSILLECTOMY     TOTAL ABDOMINAL HYSTERECTOMY     TOTAL KNEE ARTHROPLASTY Right 06/29/2020   Procedure: TOTAL KNEE ARTHROPLASTY;  Surgeon: [name], MD;  [facility];  Service: Orthopedics;  Laterality: Right;  adductor canal   TOTAL SHOULDER ARTHROPLASTY Left 02/06/2017   TOTAL SHOULDER ARTHROPLASTY Left 02/06/2017   Procedure: TOTAL SHOULDER ARTHROPLASTY;  Surgeon: [name], MD;  Location: [place];  Service: Orthopedics;  Laterality: Left;    Current Outpatient Medications  Medication Sig Dispense Refill   acetaminophen (TYLENOL) 500 MG tablet Take 1,000 mg by mouth every 6 (six) hours as needed (pain.).      apixaban (ELIQUIS) 5 MG TABS tablet Take 1 tablet (5 mg total) by mouth 2 (two) times daily. 56 tablet 0   Calcium-Magnesium-Vitamin D (CALCIUM 1200+D3 PO) Take 1 tablet by mouth daily.     citalopram (CELEXA) 40 MG tablet Take 40 mg by mouth daily.     diltiazem (CARDIZEM CD) 240 MG 24 hr capsule Take 240 mg by mouth daily.     Lactobacillus-Inulin (PROBIOTIC DIGESTIVE SUPPORT) CAPS Take 2 capsules by mouth daily.     levothyroxine (SYNTHROID, LEVOTHROID) 75 MCG tablet Take 75 mcg by mouth daily before breakfast.      MAGNESIUM PO Take 266 mg by mouth daily.     metoprolol tartrate (LOPRESSOR) 25 MG tablet Take 25 mg by mouth daily as needed.     Multiple Vitamins-Minerals (MULTIVITAMIN WITH MINERALS) tablet Take 1 tablet by mouth daily. One A Day for Women     polyethylene glycol (MIRALAX / GLYCOLAX) packet Take 17 g by mouth daily as needed for mild constipation.      simvastatin (ZOCOR) 10 MG tablet Take 10 mg by mouth daily.     spironolactone (ALDACTONE) 25 MG tablet TAKE 1 TABLET BY MOUTH  DAILY 90 tablet 1   Vitamin D3 (VITAMIN D) 25 MCG tablet Take 2,000 Units by mouth daily.      HYDROcodone-acetaminophen (NORCO) 5-325 MG tablet Take 1-2 tablets by mouth every 6 (six) hours as needed for moderate pain or severe pain. (Patient not taking: Reported on 10/22/2021) 30 tablet 0   methocarbamol (ROBAXIN) 500 MG tablet Take 1 tablet (500 mg total) by mouth every 8 (eight) hours as needed for muscle spasms. (Patient not taking: Reported on 10/22/2021) 60 tablet 1   ondansetron (ZOFRAN) 4 MG tablet Take 1 tablet (4 mg total) by mouth daily as needed for nausea or vomiting. (Patient not taking: Reported on 10/22/2021) 30 tablet 1   No current facility-administered medications for this visit.    Allergies as of 10/22/2021 - Review Complete 10/22/2021  Allergen Reaction Noted   Other Nausea And Vomiting 07/02/2021   Latex Rash 01/27/2017   Penicillins Rash and Other (See Comments) 06/15/2012  Sulfa antibiotics Rash 06/15/2012   Tramadol Itching 10/05/2018    Family History  Problem Relation Age of Onset   Atrial fibrillation Sister    Colon cancer Neg Hx     Social History   Socioeconomic History   Marital status: Widowed    Spouse name: Not on file   Number of children: Not on file   Years of education: Not on file   Highest education level: Not on file  Occupational History   Not on file  Tobacco Use   Smoking status: Never   Smokeless tobacco: Never  Vaping Use   Vaping Use: Never used  Substance and Sexual Activity   Alcohol use: No    Alcohol/week: 0.0 standard drinks   Drug use: No   Sexual activity: Not Currently    Birth control/protection: Surgical  Other Topics Concern   Not on file  Social History Narrative   Not on file   Social Determinants of Health   Financial Resource Strain: Not on file  Food Insecurity: Not on file  Transportation Needs: Not on file  Physical Activity: Not on file  Stress: Not on file  Social Connections: Not on file   Review of systems General: negative for malaise, night sweats, fever, chills, weight  loss Neck: Negative for lumps, goiter, pain and significant neck swelling Resp: Negative for cough, wheezing, dyspnea at rest CV: Negative for chest pain, leg swelling, palpitations, orthopnea GI: denies melena, nausea, vomiting, diarrhea, dysphagia, odyonophagia, early satiety or unintentional weight loss. +hematochezia MSK: Negative for joint pain or swelling, back pain, and muscle pain. Derm: Negative for itching or rash Psych: Denies depression, anxiety, memory loss, confusion. No homicidal or suicidal ideation.  Heme: Negative for prolonged bleeding, bruising easily, and swollen nodes. Endocrine: Negative for cold or heat intolerance, polyuria, polydipsia and goiter. Neuro: negative for tremor, gait imbalance, syncope and seizures. The remainder of the review of systems is noncontributory.  Physical Exam: BP 124/62 (BP Location: Left Arm, Patient Position: Sitting, Cuff Size: Normal)   Pulse 62   Temp (!) 97.3 F (36.3 C) (Oral)   Ht 5' 2.5" (1.588 m)   Wt 181 lb (82.1 kg)   BMI 32.58 kg/m  General:   Alert and oriented. No distress noted. Pleasant and cooperative.  Head:  Normocephalic and atraumatic. Eyes:  Conjuctiva clear without scleral icterus. Mouth:  Oral mucosa pink and moist. Good dentition. No lesions. Heart: Normal rate and rhythm, s1 and s2 heart sounds present.  Lungs: Clear lung sounds in all lobes. Respirations equal and unlabored. Abdomen:  +BS, soft, non-tender and non-distended. No rebound or guarding. No HSM or masses noted. Rectal:small, non thrombosed external hemorrhoids and internal hemorrhoids present, normal sphincter tone. Abigail Butts, LPN present as witness during rectal exam. Occult stool card negative. Derm: No palmar erythema or jaundice Msk:  Symmetrical without gross deformities. Normal posture. Extremities:  Without edema. Neurologic:  Alert and  oriented x4 Psych:  Alert and cooperative. Normal mood and affect.  Invalid input(s): 6 MONTHS    ASSESSMENT: LAKEISHIA TRULUCK is a 75 y.o. female presenting today after waking up with bright red blood in her underwear two weeks ago.  She reports that she woke up with blood in her underwear 2 weeks ago. She has ongoing hematochezia with streaks of bright red blood noted on her stools, this has been a chronic issue, suspected secondary to presence of hemorrhoids as last colonoscopy 2 years ago was without other findings. She reports no stool  mixed in with the blood that she noticed 2 weeks ago, she suspects it was from her rectum but cannot be sure it was not from her vaginal area as she has had some issues with stool coming from her vagina in the past and has never followed up with an OB GYN, she had an appt in May but fell and broke her shoulder and was unable to keep her appt at that time. She is having 4-5 BMs per day with some straining, however, constipation is much better than it previously was, on her current regimen of probiotics, miralax and magnesium. I am not convinced the bleeding was from her rectum instead of her vaginal area, however, it is likely that bleeding came from her hemorrhoids as she has multiple internal and external on rectal exam today. She has had no further episodes of significant bleeding. FOBT negative in office today. We will check CBC to ensure no signs of anemia, she should continue current constipation regimen, can increase miralax to once daily instead of every other day, however, she needs to follow up with OB GYN for pelvic exam given her ongoing vaginal issues/to rule out vagina as source of bleeding. If vaginal bleeding is ruled out, we will consider repeating Colonoscopy prior to her next routine surveillance due in 2025 as she is on eliquis, putting her at a higher risk for GI bleeding.   Marisa Cyphers, LPN present as witness during time of rectal exam.  PLAN:  1.continue probiotics and magnesium with good result 2. Continue miralax, can increase to 1  capful everyday 3. CBC 4. Follow up with OB GYN for Pelvic exam to r/o vaginal bleeding as source 5. Anusol cream BID x2 weeks then PRN therafter   Follow Up: 1 year  Xzayvion Vaeth L. Alver Sorrow, MSN, APRN, AGNP-C Adult-Gerontology Nurse Practitioner [facility]

## 2021-10-23 ENCOUNTER — Encounter: Payer: Self-pay | Admitting: Gastroenterology

## 2021-10-23 DIAGNOSIS — M25562 Pain in left knee: Secondary | ICD-10-CM | POA: Diagnosis not present

## 2021-10-23 DIAGNOSIS — Z4789 Encounter for other orthopedic aftercare: Secondary | ICD-10-CM | POA: Diagnosis not present

## 2021-10-24 DIAGNOSIS — K625 Hemorrhage of anus and rectum: Secondary | ICD-10-CM | POA: Diagnosis not present

## 2021-10-24 LAB — CBC
HCT: 48 % — ABNORMAL HIGH (ref 35.0–45.0)
Hemoglobin: 15.9 g/dL — ABNORMAL HIGH (ref 11.7–15.5)
MCH: 30.3 pg (ref 27.0–33.0)
MCHC: 33.1 g/dL (ref 32.0–36.0)
MCV: 91.6 fL (ref 80.0–100.0)
MPV: 10.7 fL (ref 7.5–12.5)
Platelets: 321 10*3/uL (ref 140–400)
RBC: 5.24 10*6/uL — ABNORMAL HIGH (ref 3.80–5.10)
RDW: 11.8 % (ref 11.0–15.0)
WBC: 16.7 10*3/uL — ABNORMAL HIGH (ref 3.8–10.8)

## 2021-10-25 ENCOUNTER — Telehealth (INDEPENDENT_AMBULATORY_CARE_PROVIDER_SITE_OTHER): Payer: Self-pay | Admitting: *Deleted

## 2021-10-25 NOTE — Telephone Encounter (Signed)
Pt wanted to let Vikki Ports know she saw her bloodwork results on eic and noticed some of her levels was elevated. She wanted to let her know her ortho did give a cortisone shot the day before she did bloodwork.   [phone]

## 2021-11-07 ENCOUNTER — Ambulatory Visit (INDEPENDENT_AMBULATORY_CARE_PROVIDER_SITE_OTHER): Payer: Medicare Other | Admitting: Gastroenterology

## 2021-11-11 DIAGNOSIS — R1032 Left lower quadrant pain: Secondary | ICD-10-CM | POA: Diagnosis not present

## 2021-11-27 DIAGNOSIS — M79672 Pain in left foot: Secondary | ICD-10-CM | POA: Diagnosis not present

## 2021-11-27 DIAGNOSIS — M79675 Pain in left toe(s): Secondary | ICD-10-CM | POA: Diagnosis not present

## 2021-11-27 DIAGNOSIS — S93332D Other subluxation of left foot, subsequent encounter: Secondary | ICD-10-CM | POA: Diagnosis not present

## 2021-11-29 DIAGNOSIS — I4891 Unspecified atrial fibrillation: Secondary | ICD-10-CM | POA: Diagnosis not present

## 2021-11-29 DIAGNOSIS — Z299 Encounter for prophylactic measures, unspecified: Secondary | ICD-10-CM | POA: Diagnosis not present

## 2021-11-29 DIAGNOSIS — D692 Other nonthrombocytopenic purpura: Secondary | ICD-10-CM | POA: Diagnosis not present

## 2021-11-29 DIAGNOSIS — I10 Essential (primary) hypertension: Secondary | ICD-10-CM | POA: Diagnosis not present

## 2021-11-29 DIAGNOSIS — D582 Other hemoglobinopathies: Secondary | ICD-10-CM | POA: Diagnosis not present

## 2021-12-04 DIAGNOSIS — Z8639 Personal history of other endocrine, nutritional and metabolic disease: Secondary | ICD-10-CM | POA: Diagnosis not present

## 2021-12-04 DIAGNOSIS — Z299 Encounter for prophylactic measures, unspecified: Secondary | ICD-10-CM | POA: Diagnosis not present

## 2021-12-04 DIAGNOSIS — R110 Nausea: Secondary | ICD-10-CM | POA: Diagnosis not present

## 2021-12-10 ENCOUNTER — Telehealth (INDEPENDENT_AMBULATORY_CARE_PROVIDER_SITE_OTHER): Payer: Self-pay | Admitting: Internal Medicine

## 2021-12-10 ENCOUNTER — Telehealth: Payer: Self-pay | Admitting: Internal Medicine

## 2021-12-10 NOTE — Telephone Encounter (Signed)
Pt called to give an update. Please call [phone]

## 2021-12-10 NOTE — Telephone Encounter (Signed)
Saw chelsea in November. Patient states she saw gyn and everything checked out ok. Patient states she keeps nausea and abdominal pain. Started about 2 weeks ago. Went into a fib a week ago last Thursday. Called dr Woody Seller and he done ekg. Pt states everything was back in rhythm. Pt states she had a virtual appt with dr vyas for abdominal pain and he told her it was the way she took her vitamins and to space them out and she states she tried that and has been ok today and no pain yesterday. She did vomit one time yesterday. No fever. Eats a cracker before taking meds so she dont get sick. Not able to drink coffee anymore due to it causing nausea. Started spacing out meds first part of week. States no nausea and no pain. Would like to make her routine follow up here. Advised patient I would send chelsea update and she would get a call back for her follow up appt. How soon should patient be seen?

## 2021-12-10 NOTE — Telephone Encounter (Signed)
° °  Call received this evening from [name] regarding atrial fibrillation.  Briefly, she has a history of paroxysmal atrial fibrillation, on diltiazem and as-needed Lopressor 25mg . By her symptom history, seems to have a relatively low burden of AF. Echo significant for LVH with diastolic dysfunction, but otherwise normal biventricular and valvular function. Takes Eliquis, which was last interrupted in November due to non-severe hemorrhoidal bleeding that has improved.  Tonight, noted a sudden onset of palpitations earlier this evening while at rest; reports being sedentary overall. Checked her BP which was 124 systolic and HR in 580D, so she took Lopressor about 1.5 hrs ago. BP readings have been variable and HR has ranged from 130-160 at rest. She initially has some associated chest tightness with the onset of the palpitations/AF, which is somewhat improved now. No dyspnea or nausea. No dizziness at rest or with standing. She sounds alert, calm, and comfortable on the phone, speaking normally. She would like to avoid coming in tonight.   I advised that she can try taking an additional dose of metoprolol 25mg  with the goal of symptom resolution and improvement in her HR within the next 1-2 hrs. Otherwise, I advised she should be evaluated in the ED this evening given the inadequate rate control with associated chest tightness. If she does not require ED evaluation tonight, she will follow-up with [place] again in the morning.   Caller verbalized understanding and was grateful for the call back.  Message routed to [name], [place].   Marykay Lex, MD 12/10/2021, 8:30 PM

## 2021-12-11 ENCOUNTER — Telehealth: Payer: Self-pay | Admitting: *Deleted

## 2021-12-11 NOTE — Telephone Encounter (Signed)
Patient states no more rectal bleeding since when she came in office. She is aware of her appt with [name] for hemorrhoid banding. She would like to make her follow up appt sooner. States she usually comes once a year in April. Pt notified scheduler would call her to schedule and to let us know if problems and we can try to see her sooner.

## 2021-12-11 NOTE — Telephone Encounter (Signed)
° °  Pt is calling back to f/u about her call last night regarding her Afib

## 2021-12-11 NOTE — Telephone Encounter (Signed)
Says she feels okay today and is not having any symptoms Has not checked BP or HR today. Request she check them while on phone Now BP 121/74 & HR 62 Advised to continue same medications and continue monitoring symptoms Advised to use lopressor 25 mg as needed for breakthrough palpitations and elevated HR Advised to keep up coming appointment scheduled on 01/21/2022 with [name] Advised if she develops worsening symptoms, to go to the ED for an evaluation Verbalized understanding of plan

## 2021-12-24 DIAGNOSIS — Z4789 Encounter for other orthopedic aftercare: Secondary | ICD-10-CM | POA: Diagnosis not present

## 2022-01-07 DIAGNOSIS — Z0000 Encounter for general adult medical examination without abnormal findings: Secondary | ICD-10-CM | POA: Diagnosis not present

## 2022-01-07 DIAGNOSIS — Z299 Encounter for prophylactic measures, unspecified: Secondary | ICD-10-CM | POA: Diagnosis not present

## 2022-01-07 DIAGNOSIS — Z7189 Other specified counseling: Secondary | ICD-10-CM | POA: Diagnosis not present

## 2022-01-07 DIAGNOSIS — I10 Essential (primary) hypertension: Secondary | ICD-10-CM | POA: Diagnosis not present

## 2022-01-07 DIAGNOSIS — I4891 Unspecified atrial fibrillation: Secondary | ICD-10-CM | POA: Diagnosis not present

## 2022-01-07 DIAGNOSIS — R5383 Other fatigue: Secondary | ICD-10-CM | POA: Diagnosis not present

## 2022-01-07 DIAGNOSIS — E261 Secondary hyperaldosteronism: Secondary | ICD-10-CM | POA: Diagnosis not present

## 2022-01-07 DIAGNOSIS — Z79899 Other long term (current) drug therapy: Secondary | ICD-10-CM | POA: Diagnosis not present

## 2022-01-07 DIAGNOSIS — E7800 Pure hypercholesterolemia, unspecified: Secondary | ICD-10-CM | POA: Diagnosis not present

## 2022-01-07 DIAGNOSIS — I272 Pulmonary hypertension, unspecified: Secondary | ICD-10-CM | POA: Diagnosis not present

## 2022-01-21 ENCOUNTER — Ambulatory Visit: Payer: Medicare Other | Admitting: Cardiology

## 2022-01-21 ENCOUNTER — Encounter: Payer: Self-pay | Admitting: Cardiology

## 2022-01-21 ENCOUNTER — Other Ambulatory Visit: Payer: Self-pay | Admitting: Internal Medicine

## 2022-01-21 VITALS — BP 142/84 | HR 82 | Ht 62.5 in | Wt 177.4 lb

## 2022-01-21 DIAGNOSIS — I480 Paroxysmal atrial fibrillation: Secondary | ICD-10-CM

## 2022-01-21 DIAGNOSIS — R251 Tremor, unspecified: Secondary | ICD-10-CM | POA: Diagnosis not present

## 2022-01-21 DIAGNOSIS — Z1231 Encounter for screening mammogram for malignant neoplasm of breast: Secondary | ICD-10-CM

## 2022-01-21 NOTE — Progress Notes (Signed)
Cardiology Office Note  Date: 01/21/2022   ID: Deborah, Roberts 10-05-1946, MRN [id number]  PCP:  [name], MD  Cardiologist:  Rozann Lesches, MD Electrophysiologist:  None   Chief Complaint  Patient presents with   Cardiac follow-up    History of Present Illness: [name] is a 76 y.o. female last seen in November 2022.  She is here for a follow-up visit.  Reports no obvious recurrent GI bleeding/hematochezia.  Her most recent hemoglobin was 16.3.  She did have a few episodes of atrial fibrillation and January, nothing in February.  She reports compliance with her medications as noted below.  I personally reviewed her ECG today which shows normal sinus rhythm.  She reports progressive tremors, noticeable with hands and arms at rest but worse with intention.  She asked about evaluation by neurologist.  No obvious falls or change in gait.  Past Medical History:  Diagnosis Date   Anxiety    Arthritis    Atrial fibrillation [facility])    Documented December 2019   Depression    GERD (gastroesophageal reflux disease)    Heart murmur    Hyperlipidemia    Hypertension    Hypothyroidism     Past Surgical History:  Procedure Laterality Date   APPENDECTOMY     BUNIONECTOMY Bilateral    CATARACT EXTRACTION W/PHACO Right 10/09/2017   Procedure: CATARACT EXTRACTION PHACO AND INTRAOCULAR LENS PLACEMENT ([place]);  Surgeon: [name], MD;  Location: [facility];  Service: Ophthalmology;  Laterality: Right;  CDE: 6.65   CATARACT EXTRACTION W/PHACO Left 12/11/2017   Procedure: CATARACT EXTRACTION PHACO AND INTRAOCULAR LENS PLACEMENT ([facility]);  Surgeon: [name], MD;  Location: [facility];  Service: Ophthalmology;  Laterality: Left;  CDE: 5.28   COLONOSCOPY  06/18/2012   Procedure: COLONOSCOPY;  Surgeon: [name], MD;  [facility];  Service: Endoscopy;  Laterality: N/A;  730   COLONOSCOPY N/A 07/04/2019   Rehman: one 7 mm polyp in proximal ascending colon,  removed, clips placed to treat postpolypectomy bleed (tubular adenoma), diverticulosis in sigmoid colon, hepatic flexure, external hemorrhoids   ESOPHAGOGASTRODUODENOSCOPY     Hammer toe repair Left    HEMORRHOID SURGERY     KNEE ARTHROSCOPY     left 06-2019   REVERSE SHOULDER ARTHROPLASTY Right 07/12/2021   Procedure: REVERSE SHOULDER ARTHROPLASTY;  Surgeon: [name], MD;  Location: [facility];  Service: Orthopedics;  Laterality: Right;  with ISB   SHOULDER ARTHROSCOPY     right   THYROIDECTOMY     TONSILLECTOMY     TOTAL ABDOMINAL HYSTERECTOMY     TOTAL KNEE ARTHROPLASTY Right 06/29/2020   Procedure: TOTAL KNEE ARTHROPLASTY;  Surgeon: [name], MD;  [facility];  Service: Orthopedics;  Laterality: Right;  adductor canal   TOTAL SHOULDER ARTHROPLASTY Left 02/06/2017   TOTAL SHOULDER ARTHROPLASTY Left 02/06/2017   Procedure: TOTAL SHOULDER ARTHROPLASTY;  Surgeon: [name], MD;  Location: [place];  Service: Orthopedics;  Laterality: Left;    Current Outpatient Medications  Medication Sig Dispense Refill   acetaminophen (TYLENOL) 500 MG tablet Take 1,000 mg by mouth every 6 (six) hours as needed (pain.).      apixaban (ELIQUIS) 5 MG TABS tablet Take 1 tablet (5 mg total) by mouth 2 (two) times daily. 56 tablet 0   citalopram (CELEXA) 20 MG tablet Take 20 mg by mouth daily.     diltiazem (CARDIZEM CD) 240 MG 24 hr capsule Take 240 mg by  mouth daily.     hydrocortisone (ANUSOL-HC) 2.5 % rectal cream Place 1 application rectally 2 (two) times daily. Please apply cream to rectum twice daily x2 weeks then use as needed. 28 g 1   Lactobacillus-Inulin (PROBIOTIC DIGESTIVE SUPPORT) CAPS Take 2 capsules by mouth daily.     levothyroxine (SYNTHROID, LEVOTHROID) 75 MCG tablet Take 75 mcg by mouth daily before breakfast.      MAGNESIUM PO Take 266 mg by mouth daily.     metoprolol tartrate (LOPRESSOR) 25 MG tablet Take 25 mg by mouth daily as needed.     Multiple Vitamins-Minerals  (MULTIVITAMIN WITH MINERALS) tablet Take 1 tablet by mouth daily. One A Day for Women     polyethylene glycol (MIRALAX / GLYCOLAX) packet Take 17 g by mouth daily as needed for mild constipation.      simvastatin (ZOCOR) 10 MG tablet Take 10 mg by mouth daily.     spironolactone (ALDACTONE) 25 MG tablet TAKE 1 TABLET BY MOUTH  DAILY 90 tablet 1   Vitamin D3 (VITAMIN D) 25 MCG tablet Take 2,000 Units by mouth daily.     No current facility-administered medications for this visit.   Allergies:  Other, Latex, Penicillins, Sulfa antibiotics, and Tramadol   ROS: No orthopnea or PND.  No syncope.  Physical Exam: VS:  BP (!) 142/84    Pulse 82    Ht 5' 2.5" (1.588 m)    Wt 177 lb 6.4 oz (80.5 kg)    SpO2 96%    BMI 31.93 kg/m , BMI Body mass index is 31.93 kg/m.  Wt Readings from Last 3 Encounters:  01/21/22 177 lb 6.4 oz (80.5 kg)  10/22/21 181 lb (82.1 kg)  10/14/21 179 lb (81.2 kg)    General: Patient appears comfortable at rest. HEENT: Conjunctiva and lids normal, wearing a mask. Neck: Supple, no elevated JVP or carotid bruits, no thyromegaly. Lungs: Clear to auscultation, nonlabored breathing at rest. Cardiac: Regular rate and rhythm, no S3 or significant systolic murmur, no pericardial rub. Extremities: No pitting edema.  ECG:  An ECG dated 01/07/2021 was personally reviewed today and demonstrated:  Sinus rhythm with LVH and nonspecific T wave changes.  Recent Labwork: 07/13/2021: BUN 14; Creatinine, Ser 0.77; Potassium 4.5; Sodium 136 10/24/2021: Hemoglobin 15.9; Platelets 321     Component Value Date/Time   CHOL 117 11/15/2018 0413   TRIG 102 11/15/2018 0413   HDL 39 (L) 11/15/2018 0413   CHOLHDL 3.0 11/15/2018 0413   VLDL 20 11/15/2018 0413   LDLCALC 58 11/15/2018 0413  November 2022: Hemoglobin 16.2, platelets 315, TSH 1.57, potassium 4.31 December 2021: Hemoglobin 16.3, platelets 317, BUN 12, creatinine 0.92, potassium 4.4, AST 22, ALT 17, TSH 0.68, cholesterol 180,  triglycerides 107, HDL 62, LDL 99  Other Studies Reviewed Today:  Echocardiogram 11/14/2018: Study Conclusions   - Left ventricle: The cavity size was normal. Wall thickness was    increased in a pattern of moderate LVH. Systolic function was    normal. The estimated ejection fraction was in the range of 55%    to 60%. Wall motion was normal; there were no regional wall    motion abnormalities. Features are consistent with a pseudonormal    left ventricular filling pattern, with concomitant abnormal    relaxation and increased filling pressure (grade 2 diastolic    dysfunction).   Impressions:   - Normal LV systolic function; moderate diastolic dysfunction;    moderate LVH.   Assessment and Plan:  1.  Paroxysmal atrial fibrillation with CHA2DS2-VASc score of 3.  She is in sinus rhythm today, no obvious breakthrough palpitations in February.  Plan to continue current medications including Eliquis for stroke prophylaxis, Cardizem CD and as needed Lopressor.  I reviewed her recent lab work.  2.  History of hematochezia due to hemorrhoidal bleeding.  This has been quiescent recently.  3.  Patient reported progressive tremors as discussed above.  Will make referral to [facility] neurology at her convenience.  Medication Adjustments/Labs and Tests Ordered: Current medicines are reviewed at length with the patient today.  Concerns regarding medicines are outlined above.   Tests Ordered: Orders Placed This Encounter  Procedures   Ambulatory referral to Neurology    Medication Changes: No orders of the defined types were placed in this encounter.   Disposition:  Follow up  6 months.  Signed, Satira Sark, MD, [facility] 01/21/2022 12:06 PM    [facility] [place], [place], [place] [address] Phone: [phone]; Fax: [phone]

## 2022-01-21 NOTE — Addendum Note (Signed)
Addended by: Merlene Laughter on: 01/21/2022 02:02 PM   Modules accepted: Orders

## 2022-01-21 NOTE — Patient Instructions (Signed)
Medication Instructions:  Your physician recommends that you continue on your current medications as directed. Please refer to the Current Medication list given to you today.   Labwork: none  Testing/Procedures: none  Follow-Up: Your physician recommends that you schedule a follow-up appointment in: 6 months  Any Other Special Instructions Will Be Listed Below (If Applicable). Referral to [facility]  If you need a refill on your cardiac medications before your next appointment, please call your pharmacy.

## 2022-02-08 DIAGNOSIS — L03213 Periorbital cellulitis: Secondary | ICD-10-CM | POA: Diagnosis not present

## 2022-02-08 DIAGNOSIS — H1031 Unspecified acute conjunctivitis, right eye: Secondary | ICD-10-CM | POA: Diagnosis not present

## 2022-02-13 DIAGNOSIS — H02886 Meibomian gland dysfunction of left eye, unspecified eyelid: Secondary | ICD-10-CM | POA: Diagnosis not present

## 2022-02-13 DIAGNOSIS — H524 Presbyopia: Secondary | ICD-10-CM | POA: Diagnosis not present

## 2022-02-13 DIAGNOSIS — H02883 Meibomian gland dysfunction of right eye, unspecified eyelid: Secondary | ICD-10-CM | POA: Diagnosis not present

## 2022-02-13 DIAGNOSIS — H1013 Acute atopic conjunctivitis, bilateral: Secondary | ICD-10-CM | POA: Diagnosis not present

## 2022-02-13 DIAGNOSIS — Z961 Presence of intraocular lens: Secondary | ICD-10-CM | POA: Diagnosis not present

## 2022-02-25 ENCOUNTER — Encounter: Payer: Medicare Other | Admitting: Gastroenterology

## 2022-02-26 DIAGNOSIS — M79675 Pain in left toe(s): Secondary | ICD-10-CM | POA: Diagnosis not present

## 2022-02-26 DIAGNOSIS — M79672 Pain in left foot: Secondary | ICD-10-CM | POA: Diagnosis not present

## 2022-02-26 DIAGNOSIS — S93331D Other subluxation of right foot, subsequent encounter: Secondary | ICD-10-CM | POA: Diagnosis not present

## 2022-02-27 ENCOUNTER — Ambulatory Visit: Payer: Medicare Other | Admitting: Gastroenterology

## 2022-02-27 ENCOUNTER — Encounter: Payer: Self-pay | Admitting: Gastroenterology

## 2022-02-27 VITALS — BP 118/76 | HR 75 | Temp 97.7°F | Ht 62.5 in | Wt 178.6 lb

## 2022-02-27 DIAGNOSIS — K602 Anal fissure, unspecified: Secondary | ICD-10-CM

## 2022-02-27 MED ORDER — BENEFIBER PO POWD: ORAL | 3 refills | Status: DC

## 2022-02-27 NOTE — Patient Instructions (Signed)
I would like to see you back in 2 months. ? ?I believe we are dealing with mainly the anal fissure that is causing your symptoms.  ? ?I will keep trying to call this compounded cream into [facility]. It might not be able to be called in until Monday. We will let you know when we have called it in! You will use it three times a day for 2-4 weeks.  ? ?I recommend taking Benefiber 2 teaspoons each morning with the beverage of your choice.  ? ?It's important to avoid straining, limit toilet time to 2-3 minutes.  ? ? ?

## 2022-02-27 NOTE — Progress Notes (Signed)
? ? ?Gastroenterology Office Note   ? ?Referring Provider: Scherrie Gerlach, MSN, APRN, AGNP-C/Dr Rehman  ?Primary Care Physician:  [name], MD  ?Primary GI: Dr. Laural Golden  ? ? ? ?Chief Complaint  ? ?Chief Complaint  ?Patient presents with  ? Hemorrhoids  ? ? ? ?History of Present Illness  ? ?[name] is a 76 y.o. female presenting today at the request of Scherrie Gerlach, MSN, APRN, AGNP-C due to consideration for hemorrhoid banding. Last colonoscopy in 2020 with tubular adenoma, diverticulosis.  ? ? ?Had bleeding in Nov 2022. Woke up with blood in bed and paper hematochezia. Feels like a piece of glass cutting her at time when using restroom. Will see blood only when she has the discomfort. Has strained in the past. Stopped calcium due to constipation. No fiber supplementally. Miralax as needed. Stool softener as needed.  ? ?Chronically anticoagulated with history of afib.  ? ? ? ? ?Past Medical History:  ?Diagnosis Date  ? Anxiety   ? Arthritis   ? Atrial fibrillation ([place])   ? Documented December 2019  ? Depression   ? GERD (gastroesophageal reflux disease)   ? Heart murmur   ? Hyperlipidemia   ? Hypertension   ? Hypothyroidism   ? ? ?Past Surgical History:  ?Procedure Laterality Date  ? APPENDECTOMY    ? BUNIONECTOMY Bilateral   ? CATARACT EXTRACTION W/PHACO Right 10/09/2017  ? Procedure: CATARACT EXTRACTION PHACO AND INTRAOCULAR LENS PLACEMENT ([facility]);  Surgeon: [name], MD;  [facility]: [facility];  Service: Ophthalmology;  Laterality: Right;  CDE: 6.65  ? CATARACT EXTRACTION W/PHACO Left 12/11/2017  ? Procedure: CATARACT EXTRACTION PHACO AND INTRAOCULAR LENS PLACEMENT ([facility]);  Surgeon: [name], MD;  Location: [facility];  Service: Ophthalmology;  Laterality: Left;  CDE: 5.28  ? COLONOSCOPY  06/18/2012  ? Procedure: COLONOSCOPY;  Surgeon: [name], MD;  [facility];  Service: Endoscopy;  Laterality: N/A;  730  ? COLONOSCOPY N/A 07/04/2019  ? Rehman: one 7 mm polyp in proximal  ascending colon, removed, clips placed to treat postpolypectomy bleed (tubular adenoma), diverticulosis in sigmoid colon, hepatic flexure, external hemorrhoids  ? ESOPHAGOGASTRODUODENOSCOPY    ? Hammer toe repair Left   ? HEMORRHOID SURGERY    ? KNEE ARTHROSCOPY    ? left 06-2019  ? REVERSE SHOULDER ARTHROPLASTY Right 07/12/2021  ? Procedure: REVERSE SHOULDER ARTHROPLASTY;  Surgeon: [name], MD;  [facility];  Service: Orthopedics;  Laterality: Right;  with ISB  ? SHOULDER ARTHROSCOPY    ? right  ? THYROIDECTOMY    ? TONSILLECTOMY    ? TOTAL ABDOMINAL HYSTERECTOMY    ? TOTAL KNEE ARTHROPLASTY Right 06/29/2020  ? Procedure: TOTAL KNEE ARTHROPLASTY;  Surgeon: [name], MD;  [facility];  Service: Orthopedics;  Laterality: Right;  adductor canal  ? TOTAL SHOULDER ARTHROPLASTY Left 02/06/2017  ? TOTAL SHOULDER ARTHROPLASTY Left 02/06/2017  ? Procedure: TOTAL SHOULDER ARTHROPLASTY;  Surgeon: [name], MD;  Location: [place];  Service: Orthopedics;  Laterality: Left;  ? ? ?Current Outpatient Medications  ?Medication Sig Dispense Refill  ? acetaminophen (TYLENOL) 500 MG tablet Take 1,000 mg by mouth every 6 (six) hours as needed (pain.).     ? apixaban (ELIQUIS) 5 MG TABS tablet Take 1 tablet (5 mg total) by mouth 2 (two) times daily. 56 tablet 0  ? citalopram (CELEXA) 20 MG tablet Take 20 mg by mouth daily.    ? diltiazem (CARDIZEM CD) 240 MG 24 hr  capsule Take 240 mg by mouth daily.    ? hydrocortisone (ANUSOL-HC) 2.5 % rectal cream Place 1 application rectally 2 (two) times daily. Please apply cream to rectum twice daily x2 weeks then use as needed. 28 g 1  ? Lactobacillus-Inulin (PROBIOTIC DIGESTIVE SUPPORT) CAPS Take 2 capsules by mouth daily.    ? levothyroxine (SYNTHROID, LEVOTHROID) 75 MCG tablet Take 75 mcg by mouth daily before breakfast.     ? MAGNESIUM PO Take 266 mg by mouth daily.    ? metoprolol tartrate (LOPRESSOR) 25 MG tablet Take 25 mg by mouth daily as needed.    ? Multiple  Vitamins-Minerals (MULTIVITAMIN WITH MINERALS) tablet Take 1 tablet by mouth daily. One A Day for Women    ? polyethylene glycol (MIRALAX / GLYCOLAX) packet Take 17 g by mouth daily as needed for mild constipation.     ? simvastatin (ZOCOR) 10 MG tablet Take 10 mg by mouth daily.    ? spironolactone (ALDACTONE) 25 MG tablet TAKE 1 TABLET BY MOUTH  DAILY 90 tablet 1  ? Vitamin D3 (VITAMIN D) 25 MCG tablet Take 2,000 Units by mouth daily.    ? ?No current facility-administered medications for this visit.  ? ? ?Allergies as of 02/27/2022 - Review Complete 02/27/2022  ?Allergen Reaction Noted  ? Other Nausea And Vomiting 07/02/2021  ? Latex Rash 01/27/2017  ? Penicillins Rash and Other (See Comments) 06/15/2012  ? Sulfa antibiotics Rash 06/15/2012  ? Tramadol Itching 10/05/2018  ? ? ?Family History  ?Problem Relation Age of Onset  ? Atrial fibrillation Sister   ? Colon cancer Neg Hx   ? ? ?Social History  ? ?Socioeconomic History  ? Marital status: Widowed  ?  Spouse name: Not on file  ? Number of children: Not on file  ? Years of education: Not on file  ? Highest education level: Not on file  ?Occupational History  ? Not on file  ?Tobacco Use  ? Smoking status: Never  ? Smokeless tobacco: Never  ?Vaping Use  ? Vaping Use: Never used  ?Substance and Sexual Activity  ? Alcohol use: No  ?  Alcohol/week: 0.0 standard drinks  ? Drug use: No  ? Sexual activity: Not Currently  ?  Birth control/protection: Surgical  ?Other Topics Concern  ? Not on file  ?Social History Narrative  ? Not on file  ? ?Social Determinants of Health  ? ?Financial Resource Strain: Not on file  ?Food Insecurity: Not on file  ?Transportation Needs: Not on file  ?Physical Activity: Not on file  ?Stress: Not on file  ?Social Connections: Not on file  ?Intimate Partner Violence: Not on file  ? ? ? ?Review of Systems  ? ?Gen: Denies any fever, chills, fatigue, weight loss, lack of appetite.  ?CV: Denies chest pain, heart palpitations, peripheral edema,  syncope.  ?Resp: Denies shortness of breath at rest or with exertion. Denies wheezing or cough.  ?GI: see HPI ?GU : Denies urinary burning, urinary frequency, urinary hesitancy ?MS: Denies joint pain, muscle weakness, cramps, or limitation of movement.  ?Derm: Denies rash, itching, dry skin ?Psych: Denies depression, anxiety, memory loss, and confusion ?Heme: Denies bruising, bleeding, and enlarged lymph nodes. ? ? ?Physical Exam  ? ?BP 118/76 (BP Location: Right Arm, Patient Position: Sitting, Cuff Size: Normal)   Pulse 75   Temp 97.7 ?F (36.5 ?C) (Temporal)   Ht 5' 2.5" (1.588 m)   Wt 178 lb 9.6 oz (81 kg)   SpO2 96%  BMI 32.15 kg/m?  ?General:   Alert and oriented. Pleasant and cooperative. Well-nourished and well-developed.  ?Head:  Normocephalic and atraumatic. ?Eyes:  Without icterus ?Rectal:  external hemorrhoids. Anoscopy view limited by stool. Very small grade 1 hemorrhoids left lateral and right anterior. Unable to view right posterior. No obvious fissure  ?Msk:  Symmetrical without gross deformities. Normal posture. ?Extremities:  Without edema. ?Neurologic:  Alert and  oriented x4;  grossly normal neurologically. ?Skin:  Intact without significant lesions or rashes. ?Psych:  Alert and cooperative. Normal mood and affect. ? ? ?Assessment  ? ?[name] is a 76 y.o. female presenting today with clinical signs and symptoms of predominantly anal fissure, +/- internal hemorrhoid contributing. The majority of her symptoms seem to be classic for a fissure. Anoscopy completed today with very small internal hemorrhoids. ? ?She has had no bleeding in several months but does note pain with BMs at times. I discussed that I would not pursue or recommend banding right now in light of chronic anticoagulation. Instead, we need to treat fissure supportively. If she were to have pronounced bleeding in future that caused blood loss, we could certainly consider banding at that time as benefits would outweigh  risks. Currently, benefits do not outweigh the risks. She is agreeable to this.  ? ? ?PLAN  ? ?Will call compounded cream into [facility]. I spoke with Laurey Arrow and requested 0.2% Nitro based cream to use 4 times a day for 2

## 2022-03-05 ENCOUNTER — Ambulatory Visit
Admission: RE | Admit: 2022-03-05 | Discharge: 2022-03-05 | Disposition: A | Discharge status: Home / Self Care | Payer: Medicare Other | Source: Ambulatory Visit | Attending: Internal Medicine | Admitting: Internal Medicine

## 2022-03-05 DIAGNOSIS — Z1231 Encounter for screening mammogram for malignant neoplasm of breast: Secondary | ICD-10-CM

## 2022-03-11 ENCOUNTER — Ambulatory Visit: Payer: Medicare Other | Admitting: Neurology

## 2022-03-13 ENCOUNTER — Ambulatory Visit: Payer: Medicare Other | Admitting: Neurology

## 2022-03-13 ENCOUNTER — Encounter: Payer: Self-pay | Admitting: Neurology

## 2022-03-13 VITALS — BP 142/74 | HR 66 | Ht 62.0 in | Wt 180.0 lb

## 2022-03-13 DIAGNOSIS — R251 Tremor, unspecified: Secondary | ICD-10-CM | POA: Diagnosis not present

## 2022-03-13 NOTE — Patient Instructions (Signed)
You have a rather mild and intermittent tremor of both hands.  ?I do not see any signs or symptoms of parkinson's like disease or what we call parkinsonism.  ? ?For your tremor, I would not recommend any new medication for fear of side effects (especially sleepiness) or medication interactions, especially in light of you already having a beta-blocker on your list of medications and Eliquis may not be compatible with a medication we try for tremor control called Mysoline.   ? ?Please remember, that any kind of tremor may be exacerbated by anxiety, anger, nervousness, excitement, dehydration, sleep deprivation, by caffeine, and low blood sugar values or blood sugar fluctuations. Some medications can exacerbate tremors, this includes antidepressant medication such as Celexa. ? ?I would be happy to see you back as needed. ? ?

## 2022-03-13 NOTE — Progress Notes (Signed)
Subjective:  ?  ?Patient ID: ROBERTA Roberts is a 76 y.o. female. ? ?HPI ? ? ? ?Star Age, MD, PhD ?[facility] ?[address], [address] ?[phone] ?[place], [place] [address] ? ?Dear [name], ? ?I saw your patient, [name], upon your kind request in my  neurology clinic today for initial consultation of her tremors.  The patient is accompanied by a friend today.  As you know, [name] is a 87 year old right-handed woman with an underlying medical history of paroxysmal atrial fibrillation, reflux disease, hypertension, hypothyroidism, hyperlipidemia, depression, anxiety, arthritis, and mild obesity, who reports an upper extremity tremor for the past approximately 1 year.  She has had trembling particularly in her left hand when holding something like a book.  She has not had much in the way of tremor in the lower extremities or head and neck area or voice.  She is not aware of any family history of tremors.  I reviewed your office note from 01/21/2022. ?She has a checkup with her primary care on a regular basis and typically has blood work every 6 months.  She is on thyroid medication, she has mentioned the tremor to her primary care about a year ago.  She is widowed and lives alone.  She does not smoke or drink alcohol, she drinks caffeine in the form of coffee, up to 2 cups/day.  She has been on Celexa for years, about 10 years.  She tries to hydrate well with water but is not sure how much water she drinks.  She had both shoulders replaced and right knee replacement, does not actually have any balance issues other than joint issues which are chronic.  Of note, she has a prescription for metoprolol but uses it as needed only.   ? ?Her Past Medical History Is Significant For: ?Past Medical History:  ?Diagnosis Date  ? Anxiety   ? Arthritis   ? Atrial fibrillation ([place])   ? Documented December 2019  ? Depression   ? GERD (gastroesophageal reflux disease)   ? Heart murmur   ?  Hyperlipidemia   ? Hypertension   ? Hypothyroidism   ? ? ?Her Past Surgical History Is Significant For: ?Past Surgical History:  ?Procedure Laterality Date  ? APPENDECTOMY    ? BUNIONECTOMY Bilateral   ? CATARACT EXTRACTION W/PHACO Right 10/09/2017  ? Procedure: CATARACT EXTRACTION PHACO AND INTRAOCULAR LENS PLACEMENT (IOC);  Surgeon: [name], MD;  [facility];  Service: Ophthalmology;  Laterality: Right;  CDE: 6.65  ? CATARACT EXTRACTION W/PHACO Left 12/11/2017  ? Procedure: CATARACT EXTRACTION PHACO AND INTRAOCULAR LENS PLACEMENT ([facility]);  Surgeon: [name], MD;  Location: [facility];  Service: Ophthalmology;  Laterality: Left;  CDE: 5.28  ? COLONOSCOPY  06/18/2012  ? Procedure: COLONOSCOPY;  Surgeon: [name], MD;  [facility];  Service: Endoscopy;  Laterality: N/A;  730  ? COLONOSCOPY N/A 07/04/2019  ? Rehman: one 7 mm polyp in proximal ascending colon, removed, clips placed to treat postpolypectomy bleed (tubular adenoma), diverticulosis in sigmoid colon, hepatic flexure, external hemorrhoids  ? ESOPHAGOGASTRODUODENOSCOPY    ? Hammer toe repair Left   ? HEMORRHOID SURGERY    ? KNEE ARTHROSCOPY    ? left 06-2019  ? REVERSE SHOULDER ARTHROPLASTY Right 07/12/2021  ? Procedure: REVERSE SHOULDER ARTHROPLASTY;  Surgeon: [name], MD;  Location: [facility];  Service: Orthopedics;  Laterality: Right;  with ISB  ? SHOULDER ARTHROSCOPY    ? right  ?  THYROIDECTOMY    ? TONSILLECTOMY    ? TOTAL ABDOMINAL HYSTERECTOMY    ? TOTAL KNEE ARTHROPLASTY Right 06/29/2020  ? Procedure: TOTAL KNEE ARTHROPLASTY;  Surgeon: [name], MD;  [facility];  Service: Orthopedics;  Laterality: Right;  adductor canal  ? TOTAL SHOULDER ARTHROPLASTY Left 02/06/2017  ? TOTAL SHOULDER ARTHROPLASTY Left 02/06/2017  ? Procedure: TOTAL SHOULDER ARTHROPLASTY;  Surgeon: [name], MD;  Location: [facility];  Service: Orthopedics;  Laterality: Left;  ? ? ?Her Family History Is Significant For: ?Family History   ?Problem Relation Age of Onset  ? Atrial fibrillation Sister   ? Colon cancer Neg Hx   ? Breast cancer Neg Hx   ? Parkinson's disease Neg Hx   ? Tremor Neg Hx   ? ? ?Her Social History Is Significant For: ?Social History  ? ?Socioeconomic History  ? Marital status: Widowed  ?  Spouse name: Not on file  ? Number of children: Not on file  ? Years of education: Not on file  ? Highest education level: Not on file  ?Occupational History  ? Not on file  ?Tobacco Use  ? Smoking status: Never  ? Smokeless tobacco: Never  ?Vaping Use  ? Vaping Use: Never used  ?Substance and Sexual Activity  ? Alcohol use: No  ?  Alcohol/week: 0.0 standard drinks  ? Drug use: No  ? Sexual activity: Not Currently  ?  Birth control/protection: Surgical  ?Other Topics Concern  ? Not on file  ?Social History Narrative  ? Not on file  ? ?Social Determinants of Health  ? ?Financial Resource Strain: Not on file  ?Food Insecurity: Not on file  ?Transportation Needs: Not on file  ?Physical Activity: Not on file  ?Stress: Not on file  ?Social Connections: Not on file  ? ? ?Her Allergies Are:  ?Allergies  ?Allergen Reactions  ? Other Nausea And Vomiting  ?  Opiate based pain medicines   ? Latex Rash  ? Penicillins Rash and Other (See Comments)  ?  Has patient had a PCN reaction causing immediate rash, facial/tongue/throat swelling, SOB or lightheadedness with hypotension: #  #  #  YES  #  #  #  ?Has patient had a PCN reaction causing severe rash involving mucus membranes or skin necrosis: No ?Has patient had a PCN reaction that required hospitalization Yes ?Has patient had a PCN reaction occurring within the last 10 years: No ?If all of the above answers are "NO", then may proceed with Cephalosporin use. ?  ? Sulfa Antibiotics Rash  ? Tramadol Itching  ?:  ? ?Her Current Medications Are:  ?Outpatient Encounter Medications as of 03/13/2022  ?Medication Sig  ? acetaminophen (TYLENOL) 500 MG tablet Take 1,000 mg by mouth every 6 (six) hours as needed  (pain.).   ? apixaban (ELIQUIS) 5 MG TABS tablet Take 1 tablet (5 mg total) by mouth 2 (two) times daily.  ? citalopram (CELEXA) 20 MG tablet Take 20 mg by mouth daily.  ? diltiazem (CARDIZEM CD) 240 MG 24 hr capsule Take 240 mg by mouth daily.  ? hydrocortisone (ANUSOL-HC) 2.5 % rectal cream Place 1 application rectally 2 (two) times daily. Please apply cream to rectum twice daily x2 weeks then use as needed.  ? Lactobacillus-Inulin (PROBIOTIC DIGESTIVE SUPPORT) CAPS Take 2 capsules by mouth daily.  ? levothyroxine (SYNTHROID, LEVOTHROID) 75 MCG tablet Take 75 mcg by mouth daily before breakfast.   ? MAGNESIUM PO Take 266 mg by mouth daily.  ?  metoprolol tartrate (LOPRESSOR) 25 MG tablet Take 25 mg by mouth daily as needed.  ? Multiple Vitamins-Minerals (MULTIVITAMIN WITH MINERALS) tablet Take 1 tablet by mouth daily. One A Day for Women  ? polyethylene glycol (MIRALAX / GLYCOLAX) packet Take 17 g by mouth daily as needed for mild constipation.   ? simvastatin (ZOCOR) 10 MG tablet Take 10 mg by mouth daily.  ? spironolactone (ALDACTONE) 25 MG tablet TAKE 1 TABLET BY MOUTH  DAILY  ? Vitamin D3 (VITAMIN D) 25 MCG tablet Take 2,000 Units by mouth daily.  ? Wheat Dextrin (BENEFIBER) POWD Take 2 teaspoons daily mixed in beverage of choice.  ? ?No facility-administered encounter medications on file as of 03/13/2022.  ?: ? ? ?Review of Systems:  ?Out of a complete 14 point review of systems, all are reviewed and negative with the exception of these symptoms as listed below: ? ? ?Review of Systems  ?Neurological:   ?     Pt is here for increased tremors.Pt states she has tremors in both hands . Pt states her left is worse. Pt states she cant hardly write because of tremors   ? ?Objective:  ?Neurological Exam ? ?Physical Exam ?Physical Examination:  ? ?Vitals:  ? 03/13/22 1454  ?BP: (!) 142/74  ?Pulse: 66  ? ? ?General Examination: The patient is a very pleasant 76 y.o. female in no acute distress. She appears well-developed  and well-nourished and well groomed.  ? ?HEENT: Normocephalic, atraumatic, pupils are equal, round and reactive to light, extraocular tracking is good without limitation to gaze excursion or nystagmus noted.  C

## 2022-03-25 DIAGNOSIS — Z789 Other specified health status: Secondary | ICD-10-CM | POA: Diagnosis not present

## 2022-03-25 DIAGNOSIS — I10 Essential (primary) hypertension: Secondary | ICD-10-CM | POA: Diagnosis not present

## 2022-03-25 DIAGNOSIS — Z299 Encounter for prophylactic measures, unspecified: Secondary | ICD-10-CM | POA: Diagnosis not present

## 2022-03-25 DIAGNOSIS — M25552 Pain in left hip: Secondary | ICD-10-CM | POA: Diagnosis not present

## 2022-05-21 ENCOUNTER — Ambulatory Visit: Payer: Medicare Other | Admitting: Gastroenterology

## 2022-05-22 DIAGNOSIS — Z79899 Other long term (current) drug therapy: Secondary | ICD-10-CM | POA: Diagnosis not present

## 2022-05-22 DIAGNOSIS — Z299 Encounter for prophylactic measures, unspecified: Secondary | ICD-10-CM | POA: Diagnosis not present

## 2022-05-22 DIAGNOSIS — Z789 Other specified health status: Secondary | ICD-10-CM | POA: Diagnosis not present

## 2022-05-22 DIAGNOSIS — E7800 Pure hypercholesterolemia, unspecified: Secondary | ICD-10-CM | POA: Diagnosis not present

## 2022-05-22 DIAGNOSIS — D582 Other hemoglobinopathies: Secondary | ICD-10-CM | POA: Diagnosis not present

## 2022-05-22 DIAGNOSIS — Z0000 Encounter for general adult medical examination without abnormal findings: Secondary | ICD-10-CM | POA: Diagnosis not present

## 2022-05-22 DIAGNOSIS — I10 Essential (primary) hypertension: Secondary | ICD-10-CM | POA: Diagnosis not present

## 2022-05-23 DIAGNOSIS — M159 Polyosteoarthritis, unspecified: Secondary | ICD-10-CM | POA: Diagnosis not present

## 2022-05-23 DIAGNOSIS — I10 Essential (primary) hypertension: Secondary | ICD-10-CM | POA: Diagnosis not present

## 2022-05-23 DIAGNOSIS — E7800 Pure hypercholesterolemia, unspecified: Secondary | ICD-10-CM | POA: Diagnosis not present

## 2022-05-28 ENCOUNTER — Ambulatory Visit: Payer: Medicare Other | Admitting: Gastroenterology

## 2022-06-04 DIAGNOSIS — M79675 Pain in left toe(s): Secondary | ICD-10-CM | POA: Diagnosis not present

## 2022-06-04 DIAGNOSIS — M79671 Pain in right foot: Secondary | ICD-10-CM | POA: Diagnosis not present

## 2022-06-04 DIAGNOSIS — S93332D Other subluxation of left foot, subsequent encounter: Secondary | ICD-10-CM | POA: Diagnosis not present

## 2022-06-04 DIAGNOSIS — S93331D Other subluxation of right foot, subsequent encounter: Secondary | ICD-10-CM | POA: Diagnosis not present

## 2022-06-04 DIAGNOSIS — M779 Enthesopathy, unspecified: Secondary | ICD-10-CM | POA: Diagnosis not present

## 2022-06-04 DIAGNOSIS — M79672 Pain in left foot: Secondary | ICD-10-CM | POA: Diagnosis not present

## 2022-06-04 DIAGNOSIS — M79674 Pain in right toe(s): Secondary | ICD-10-CM | POA: Diagnosis not present

## 2022-06-24 DIAGNOSIS — I10 Essential (primary) hypertension: Secondary | ICD-10-CM | POA: Diagnosis not present

## 2022-06-24 DIAGNOSIS — M810 Age-related osteoporosis without current pathological fracture: Secondary | ICD-10-CM | POA: Diagnosis not present

## 2022-06-24 DIAGNOSIS — Z299 Encounter for prophylactic measures, unspecified: Secondary | ICD-10-CM | POA: Diagnosis not present

## 2022-06-24 DIAGNOSIS — Z713 Dietary counseling and surveillance: Secondary | ICD-10-CM | POA: Diagnosis not present

## 2022-07-07 DIAGNOSIS — D582 Other hemoglobinopathies: Secondary | ICD-10-CM | POA: Diagnosis not present

## 2022-07-07 DIAGNOSIS — R5383 Other fatigue: Secondary | ICD-10-CM | POA: Diagnosis not present

## 2022-07-07 DIAGNOSIS — H6690 Otitis media, unspecified, unspecified ear: Secondary | ICD-10-CM | POA: Diagnosis not present

## 2022-07-07 DIAGNOSIS — I10 Essential (primary) hypertension: Secondary | ICD-10-CM | POA: Diagnosis not present

## 2022-07-07 DIAGNOSIS — Z79899 Other long term (current) drug therapy: Secondary | ICD-10-CM | POA: Diagnosis not present

## 2022-07-07 DIAGNOSIS — E7800 Pure hypercholesterolemia, unspecified: Secondary | ICD-10-CM | POA: Diagnosis not present

## 2022-07-07 DIAGNOSIS — Z299 Encounter for prophylactic measures, unspecified: Secondary | ICD-10-CM | POA: Diagnosis not present

## 2022-07-14 DIAGNOSIS — D369 Benign neoplasm, unspecified site: Secondary | ICD-10-CM | POA: Diagnosis not present

## 2022-07-14 DIAGNOSIS — I10 Essential (primary) hypertension: Secondary | ICD-10-CM | POA: Diagnosis not present

## 2022-07-14 DIAGNOSIS — D692 Other nonthrombocytopenic purpura: Secondary | ICD-10-CM | POA: Diagnosis not present

## 2022-07-14 DIAGNOSIS — L570 Actinic keratosis: Secondary | ICD-10-CM | POA: Diagnosis not present

## 2022-07-14 DIAGNOSIS — Z299 Encounter for prophylactic measures, unspecified: Secondary | ICD-10-CM | POA: Diagnosis not present

## 2022-07-22 DIAGNOSIS — M545 Low back pain, unspecified: Secondary | ICD-10-CM | POA: Diagnosis not present

## 2022-07-22 DIAGNOSIS — M25562 Pain in left knee: Secondary | ICD-10-CM | POA: Diagnosis not present

## 2022-07-22 DIAGNOSIS — M1711 Unilateral primary osteoarthritis, right knee: Secondary | ICD-10-CM | POA: Diagnosis not present

## 2022-07-22 DIAGNOSIS — M5136 Other intervertebral disc degeneration, lumbar region: Secondary | ICD-10-CM | POA: Diagnosis not present

## 2022-07-29 NOTE — Progress Notes (Unsigned)
Cardiology Office Note  Date: 07/30/2022   ID: Roberts, Deborah [birth year], MRN [id number]  PCP:  [name], MD  Cardiologist:  Rozann Lesches, MD Electrophysiologist:  None   Chief Complaint  Patient presents with   Cardiac follow-up    History of Present Illness: [name] is a 76 y.o. female last seen in February.  She is here for a follow-up visit.  Reports an overnight episode of atrial fibrillation back in July, improved with as needed dose of metoprolol.  Otherwise no progressive symptoms.  She just had lab work obtained by PCP, results noted below.  She does not describe any spontaneous bleeding problems on Eliquis and also continues on Cardizem CD.  Past Medical History:  Diagnosis Date   Anxiety    Arthritis    Atrial fibrillation [facility])    Documented December 2019   Depression    GERD (gastroesophageal reflux disease)    Heart murmur    Hyperlipidemia    Hypertension    Hypothyroidism     Past Surgical History:  Procedure Laterality Date   APPENDECTOMY     BUNIONECTOMY Bilateral    CATARACT EXTRACTION W/PHACO Right 10/09/2017   Procedure: CATARACT EXTRACTION PHACO AND INTRAOCULAR LENS PLACEMENT ([place]);  Surgeon: [name], MD;  [facility];  Service: Ophthalmology;  Laterality: Right;  CDE: 6.65   CATARACT EXTRACTION W/PHACO Left 12/11/2017   Procedure: CATARACT EXTRACTION PHACO AND INTRAOCULAR LENS PLACEMENT (IOC);  Surgeon: [name], MD;  [facility];  Service: Ophthalmology;  Laterality: Left;  CDE: 5.28   COLONOSCOPY  06/18/2012   Procedure: COLONOSCOPY;  Surgeon: [name], MD;  [facility];  Service: Endoscopy;  Laterality: N/A;  730   COLONOSCOPY N/A 07/04/2019   Rehman: one 7 mm polyp in proximal ascending colon, removed, clips placed to treat postpolypectomy bleed (tubular adenoma), diverticulosis in sigmoid colon, hepatic flexure, external hemorrhoids   ESOPHAGOGASTRODUODENOSCOPY     Hammer  toe repair Left    HEMORRHOID SURGERY     KNEE ARTHROSCOPY     left 06-2019   REVERSE SHOULDER ARTHROPLASTY Right 07/12/2021   Procedure: REVERSE SHOULDER ARTHROPLASTY;  Surgeon: [name], MD;  [facility];  Service: Orthopedics;  Laterality: Right;  with ISB   SHOULDER ARTHROSCOPY     right   THYROIDECTOMY     TONSILLECTOMY     TOTAL ABDOMINAL HYSTERECTOMY     TOTAL KNEE ARTHROPLASTY Right 06/29/2020   Procedure: TOTAL KNEE ARTHROPLASTY;  Surgeon: [name], MD;  [facility];  Service: Orthopedics;  Laterality: Right;  adductor canal   TOTAL SHOULDER ARTHROPLASTY Left 02/06/2017   TOTAL SHOULDER ARTHROPLASTY Left 02/06/2017   Procedure: TOTAL SHOULDER ARTHROPLASTY;  Surgeon: [name], MD;  Location: [facility];  Service: Orthopedics;  Laterality: Left;    Current Outpatient Medications  Medication Sig Dispense Refill   acetaminophen (TYLENOL) 500 MG tablet Take 1,000 mg by mouth every 6 (six) hours as needed (pain.).      apixaban (ELIQUIS) 5 MG TABS tablet Take 1 tablet (5 mg total) by mouth 2 (two) times daily. 56 tablet 0   Calcium Carbonate (CALCIUM 600 PO) Take 2 tablets by mouth daily.     citalopram (CELEXA) 20 MG tablet Take 20 mg by mouth daily.     diltiazem (CARDIZEM CD) 240 MG 24 hr capsule Take 240 mg by mouth daily.     Lactobacillus-Inulin (PROBIOTIC DIGESTIVE SUPPORT) CAPS Take 2 capsules by mouth  daily.     levothyroxine (SYNTHROID, LEVOTHROID) 75 MCG tablet Take 75 mcg by mouth daily before breakfast.      MAGNESIUM PO Take 266 mg by mouth daily.     metoprolol tartrate (LOPRESSOR) 25 MG tablet Take 25 mg by mouth daily as needed.     Multiple Vitamins-Minerals (MULTIVITAMIN WITH MINERALS) tablet Take 1 tablet by mouth daily. One A Day for Women     simvastatin (ZOCOR) 10 MG tablet Take 10 mg by mouth daily.     spironolactone (ALDACTONE) 25 MG tablet TAKE 1 TABLET BY MOUTH  DAILY 90 tablet 1   Vitamin D3 (VITAMIN D) 25 MCG tablet Take 2,000 Units  by mouth daily.     Wheat Dextrin (BENEFIBER) POWD Take 2 teaspoons daily mixed in beverage of choice. 350 g 3   hydrocortisone (ANUSOL-HC) 2.5 % rectal cream Place 1 application rectally 2 (two) times daily. Please apply cream to rectum twice daily x2 weeks then use as needed. 28 g 1   No current facility-administered medications for this visit.   Allergies:  Other, Latex, Penicillins, Sulfa antibiotics, and Tramadol   ROS: Right knee pain.  No syncope.  Physical Exam: VS:  BP 110/76 (BP Location: Right Arm, Patient Position: Sitting, Cuff Size: Large)   Pulse 67   Ht 5' 2.5" (1.588 m)   Wt 179 lb (81.2 kg)   SpO2 97%   BMI 32.22 kg/m , BMI Body mass index is 32.22 kg/m.  Wt Readings from Last 3 Encounters:  07/30/22 179 lb (81.2 kg)  03/13/22 180 lb (81.6 kg)  02/27/22 178 lb 9.6 oz (81 kg)    General: Patient appears comfortable at rest. HEENT: Conjunctiva and lids normal. Neck: Supple, no elevated JVP or carotid bruits, no thyromegaly. Lungs: Clear to auscultation, nonlabored breathing at rest. Cardiac: Regular rate and rhythm, no S3 or significant systolic murmur. Extremities: No pitting edema.  ECG:  An ECG dated 01/21/2022 was personally reviewed today and demonstrated:  Sinus rhythm.  Recent Labwork: 10/24/2021: Hemoglobin 15.9; Platelets 321     Component Value Date/Time   CHOL 117 11/15/2018 0413   TRIG 102 11/15/2018 0413   HDL 39 (L) 11/15/2018 0413   CHOLHDL 3.0 11/15/2018 0413   VLDL 20 11/15/2018 0413   LDLCALC 58 11/15/2018 0413  August 2023: Cholesterol 164, triglycerides 146, HDL 53, LDL 86, BUN 18, creatinine 0.83, potassium 4.4, AST 24, ALT 23, hemoglobin 14.6, platelets 303  Other Studies Reviewed Today:  Echocardiogram 11/14/2018: Study Conclusions   - Left ventricle: The cavity size was normal. Wall thickness was    increased in a pattern of moderate LVH. Systolic function was    normal. The estimated ejection fraction was in the range of 55%     to 60%. Wall motion was normal; there were no regional wall    motion abnormalities. Features are consistent with a pseudonormal    left ventricular filling pattern, with concomitant abnormal    relaxation and increased filling pressure (grade 2 diastolic    dysfunction).   Impressions:   - Normal LV systolic function; moderate diastolic dysfunction;    moderate LVH.   Assessment and Plan:  1.  Paroxysmal atrial fibrillation with CHA2DS2-VASc score of 3.  She is doing well since breakthrough episode in July.  Continues on Cardizem CD with as needed use of Lopressor.  Tolerating Eliquis without spontaneous bleeding problems.  I reviewed her interval lab work.  2.  Essential hypertension, blood pressure is well  controlled today on current regimen.  No changes were made.  Medication Adjustments/Labs and Tests Ordered: Current medicines are reviewed at length with the patient today.  Concerns regarding medicines are outlined above.   Tests Ordered: No orders of the defined types were placed in this encounter.   Medication Changes: No orders of the defined types were placed in this encounter.   Disposition:  Follow up  6 months.  Signed, Satira Sark, MD, [facility] 07/30/2022 11:47 AM    [facility] [place], [place], [place] [address] Phone: [phone]; Fax: [phone]

## 2022-07-30 ENCOUNTER — Other Ambulatory Visit: Payer: Self-pay | Admitting: *Deleted

## 2022-07-30 ENCOUNTER — Telehealth: Payer: Self-pay | Admitting: Cardiology

## 2022-07-30 ENCOUNTER — Encounter: Payer: Self-pay | Admitting: Cardiology

## 2022-07-30 ENCOUNTER — Ambulatory Visit: Payer: Medicare Other | Attending: Cardiology | Admitting: Cardiology

## 2022-07-30 VITALS — BP 110/76 | HR 67 | Ht 62.5 in | Wt 179.0 lb

## 2022-07-30 DIAGNOSIS — I10 Essential (primary) hypertension: Secondary | ICD-10-CM | POA: Diagnosis not present

## 2022-07-30 DIAGNOSIS — I480 Paroxysmal atrial fibrillation: Secondary | ICD-10-CM

## 2022-07-30 MED ORDER — APIXABAN 5 MG PO TABS: 5.0000 mg | ORAL_TABLET | Freq: Two times a day (BID) | ORAL | 0 refills | Status: AC

## 2022-07-30 MED ORDER — APIXABAN 5 MG PO TABS: 5.0000 mg | ORAL_TABLET | Freq: Two times a day (BID) | ORAL | 3 refills | Status: DC

## 2022-07-30 NOTE — Telephone Encounter (Signed)
   Pre-operative Risk Assessment    Patient Name: [name]  DOB: Apr 07, 1946 MRN: [id number]      Request for Surgical Clearance    Procedure:   left TKA  Date of Surgery:  Clearance TBD                                 Surgeon:  Dr. Esmond Plants Surgeon's Group or Practice Name:  [facility] Phone number:  [phone] Fax number:  [phone]   Type of Clearance Requested:   - Medical    Type of Anesthesia:   Spinal and General   Additional requests/questions:    SignedHipolito Bayley   07/30/2022, 4:40 PM

## 2022-07-30 NOTE — Patient Instructions (Addendum)

## 2022-07-31 NOTE — Telephone Encounter (Signed)
   Patient Name: [name]  DOB: Jul 11, 1946 MRN: [id number]  Primary Cardiologist: Rozann Lesches, MD  Chart reviewed as part of pre-operative protocol coverage. Pre-op clearance already addressed by colleagues in earlier phone notes. To summarize recommendations:  -No history of ischemic heart disease or heart failure.  Being considered for left TKR with either spinal or general anesthesia.  RCRI cardiac index is class I, low risk and 0.4% chance of major adverse cardiac event.  No major change in her stamina to suggest any need for further ischemic work-up.  She should be able to proceed without further cardiac testing and would generally be able to hold Eliquis 48 hours prior.  -[name]  Will route this bundled recommendation to requesting provider via Epic fax function and remove from pre-op pool. Please call with questions.  Elgie Collard, PA-C 07/31/2022, 8:31 AM

## 2022-08-08 DIAGNOSIS — Z299 Encounter for prophylactic measures, unspecified: Secondary | ICD-10-CM | POA: Diagnosis not present

## 2022-08-08 DIAGNOSIS — K5900 Constipation, unspecified: Secondary | ICD-10-CM | POA: Diagnosis not present

## 2022-08-08 DIAGNOSIS — I10 Essential (primary) hypertension: Secondary | ICD-10-CM | POA: Diagnosis not present

## 2022-08-08 DIAGNOSIS — Z789 Other specified health status: Secondary | ICD-10-CM | POA: Diagnosis not present

## 2022-08-08 DIAGNOSIS — M810 Age-related osteoporosis without current pathological fracture: Secondary | ICD-10-CM | POA: Diagnosis not present

## 2022-08-08 DIAGNOSIS — I4891 Unspecified atrial fibrillation: Secondary | ICD-10-CM | POA: Diagnosis not present

## 2022-08-21 ENCOUNTER — Telehealth: Payer: Self-pay | Admitting: Cardiology

## 2022-08-21 NOTE — Telephone Encounter (Signed)
Patient states that she was informed that her Eliquis application for patient assistance was not completed and that there is a portion that she would need to sign and return back. States that she was informed by the company that someone with patient assistance reached out to the office but was unable to speak with someone.  Informed the patient that I am currently in the [place] office today and that I will forward message to the [place] office for clarification.  I asked the patient if she would need samples of Eliquis. States that she would only need about 2 months worth of refills.

## 2022-08-21 NOTE — Telephone Encounter (Signed)
Pt c/o medication issue:  1. Name of Medication: apixaban (ELIQUIS) 5 MG TABS tablet  2. How are you currently taking this medication (dosage and times per day)? 1 tablet twice a day  3. Are you having a reaction (difficulty breathing--STAT)? no  4. What is your medication issue? Patient states she was supposed to be trying to get patient assistance for eliquis. She says the company told her they have not received the completed signed form. She says they told her reached out yesterday and have not heard anything back.

## 2022-08-22 NOTE — Telephone Encounter (Signed)
Advised that [employer] request physician form and it has been faxed. Advised to contact office next week for samples if needed Verbalized understanding of plan

## 2022-09-02 NOTE — H&P (Signed)
Patient's anticipated LOS is less than 2 midnights, meeting these requirements: - Younger than 104 - Lives within 1 hour of care - Has a competent adult at home to recover with post-op recover - NO history of  - Chronic pain requiring opiods  - Diabetes  - Coronary Artery Disease  - Heart failure  - Heart attack  - Stroke  - DVT/VTE  - Cardiac arrhythmia  - Respiratory Failure/COPD  - Renal failure  - Anemia  - Advanced Liver disease     [name] is an 76 y.o. female.    Chief Complaint: left knee pain  HPI: Pt is a 76 y.o. female complaining of left knee pain for multiple years. Pain had continually increased since the beginning. X-rays in the clinic show end-stage arthritic changes of the left knee. Pt has tried various conservative treatments which have failed to alleviate their symptoms, including injections and therapy. Various options are discussed with the patient. Risks, benefits and expectations were discussed with the patient. Patient understand the risks, benefits and expectations and wishes to proceed with surgery.   PCP:  [name], MD  D/C Plans: Home  PMH: Past Medical History:  Diagnosis Date   Anxiety    Arthritis    Atrial fibrillation [facility])    Documented December 2019   Depression    GERD (gastroesophageal reflux disease)    Heart murmur    Hyperlipidemia    Hypertension    Hypothyroidism     PSH: Past Surgical History:  Procedure Laterality Date   APPENDECTOMY     BUNIONECTOMY Bilateral    CATARACT EXTRACTION W/PHACO Right 10/09/2017   Procedure: CATARACT EXTRACTION PHACO AND INTRAOCULAR LENS PLACEMENT ([place]);  Surgeon: [name], MD;  [facility];  Service: Ophthalmology;  Laterality: Right;  CDE: 6.65   CATARACT EXTRACTION W/PHACO Left 12/11/2017   Procedure: CATARACT EXTRACTION PHACO AND INTRAOCULAR LENS PLACEMENT ([facility]);  Surgeon: [name], MD;  Location: [facility];  Service: Ophthalmology;  Laterality: Left;  CDE:  5.28   COLONOSCOPY  06/18/2012   Procedure: COLONOSCOPY;  Surgeon: [name], MD;  [facility];  Service: Endoscopy;  Laterality: N/A;  730   COLONOSCOPY N/A 07/04/2019   Rehman: one 7 mm polyp in proximal ascending colon, removed, clips placed to treat postpolypectomy bleed (tubular adenoma), diverticulosis in sigmoid colon, hepatic flexure, external hemorrhoids   ESOPHAGOGASTRODUODENOSCOPY     Hammer toe repair Left    HEMORRHOID SURGERY     KNEE ARTHROSCOPY     left 06-2019   REVERSE SHOULDER ARTHROPLASTY Right 07/12/2021   Procedure: REVERSE SHOULDER ARTHROPLASTY;  Surgeon: [name], MD;  [facility];  Service: Orthopedics;  Laterality: Right;  with ISB   SHOULDER ARTHROSCOPY     right   THYROIDECTOMY     TONSILLECTOMY     TOTAL ABDOMINAL HYSTERECTOMY     TOTAL KNEE ARTHROPLASTY Right 06/29/2020   Procedure: TOTAL KNEE ARTHROPLASTY;  Surgeon: [name], MD;  [facility];  Service: Orthopedics;  Laterality: Right;  adductor canal   TOTAL SHOULDER ARTHROPLASTY Left 02/06/2017   TOTAL SHOULDER ARTHROPLASTY Left 02/06/2017   Procedure: TOTAL SHOULDER ARTHROPLASTY;  Surgeon: [name], MD;  Location: [place];  Service: Orthopedics;  Laterality: Left;    Social History:  reports that she has never smoked. She has never been exposed to tobacco smoke. She has never used smokeless tobacco. She reports that she does not drink alcohol and does not use drugs. BMI:  Estimated body mass index is 32.22 kg/m as calculated from the following:   Height as of 07/30/22: 5' 2.5" (1.588 m).   Weight as of 07/30/22: 81.2 kg.  Lab Results  Component Value Date   ALBUMIN 3.9 11/13/2018   Diabetes: Patient does not have a diagnosis of diabetes.     Smoking Status:   reports that she has never smoked. She has never been exposed to tobacco smoke. She has never used smokeless tobacco.    Allergies:  Allergies  Allergen Reactions   Other Nausea And Vomiting     Opiate based pain medicines    Latex Rash   Penicillins Rash and Other (See Comments)    Has patient had a PCN reaction causing immediate rash, facial/tongue/throat swelling, SOB or lightheadedness with hypotension: #  #  #  YES  #  #  #  Has patient had a PCN reaction causing severe rash involving mucus membranes or skin necrosis: No Has patient had a PCN reaction that required hospitalization Yes Has patient had a PCN reaction occurring within the last 10 years: No If all of the above answers are "NO", then may proceed with Cephalosporin use.    Sulfa Antibiotics Rash   Tramadol Itching    Medications: No current facility-administered medications for this encounter.   Current Outpatient Medications  Medication Sig Dispense Refill   acetaminophen (TYLENOL) 500 MG tablet Take 1,000 mg by mouth every 6 (six) hours as needed (pain.).      apixaban (ELIQUIS) 5 MG TABS tablet Take 1 tablet (5 mg total) by mouth 2 (two) times daily. 28 tablet 0   Calcium Carbonate (CALCIUM 600 PO) Take 2 tablets by mouth daily.     citalopram (CELEXA) 20 MG tablet Take 20 mg by mouth daily.     diltiazem (CARDIZEM CD) 240 MG 24 hr capsule Take 240 mg by mouth daily.     hydrocortisone (ANUSOL-HC) 2.5 % rectal cream Place 1 application rectally 2 (two) times daily. Please apply cream to rectum twice daily x2 weeks then use as needed. 28 g 1   Lactobacillus-Inulin (PROBIOTIC DIGESTIVE SUPPORT) CAPS Take 2 capsules by mouth daily.     levothyroxine (SYNTHROID, LEVOTHROID) 75 MCG tablet Take 75 mcg by mouth daily before breakfast.      MAGNESIUM PO Take 266 mg by mouth daily.     metoprolol tartrate (LOPRESSOR) 25 MG tablet Take 25 mg by mouth daily as needed.     Multiple Vitamins-Minerals (MULTIVITAMIN WITH MINERALS) tablet Take 1 tablet by mouth daily. One A Day for Women     simvastatin (ZOCOR) 10 MG tablet Take 10 mg by mouth daily.     spironolactone (ALDACTONE) 25 MG tablet TAKE 1 TABLET BY MOUTH  DAILY  90 tablet 1   Vitamin D3 (VITAMIN D) 25 MCG tablet Take 2,000 Units by mouth daily.     Wheat Dextrin (BENEFIBER) POWD Take 2 teaspoons daily mixed in beverage of choice. 350 g 3    No results found for this or any previous visit (from the past 48 hour(s)). No results found.  ROS: Pain with rom of the left lower extremity  Physical Exam: Alert and oriented 76 y.o. female in no acute distress Cranial nerves 2-12 intact Cervical spine: full rom with no tenderness, nv intact distally Chest: active breath sounds bilaterally, no wheeze rhonchi or rales Heart: regular rate and rhythm, no murmur Abd: non tender non distended with active bowel sounds Hip is stable with rom  Left knee painful rom Nv intact distally Antalgic gait  Assessment/Plan Assessment: left knee end stage osteoarthritis  Plan:  Patient will undergo a left total knee by Dr. Veverly Fells at [place] Risks benefits and expectations were discussed with the patient. Patient understand risks, benefits and expectations and wishes to proceed. Preoperative templating of the joint replacement has been completed, documented, and submitted to the Operating Room personnel in order to optimize intra-operative equipment management.   Merla Riches PA-C, MPAS [facility] is now [employer] [address], [place], [place], [place] [address] Phone: [phone] [email or web address] Facebook  [employer]

## 2022-09-03 ENCOUNTER — Telehealth: Payer: Self-pay | Admitting: *Deleted

## 2022-09-03 NOTE — Telephone Encounter (Signed)
Contacted patient to see if she filed taxes and she confirmed that she does file taxes each year but didn't feel comfortable allowing others to see that information. Advised she has the option to initial page 3 of the application which is equal to her tax return. Says she is no longer interested in BMS-PAF eliquis application.  Advised that application will be filed in her chart if she changes her mind.

## 2022-09-16 ENCOUNTER — Encounter (HOSPITAL_COMMUNITY): Payer: Medicare Other

## 2022-09-22 ENCOUNTER — Ambulatory Visit: Admit: 2022-09-22 | Payer: Medicare Other | Admitting: Orthopedic Surgery

## 2022-09-22 SURGERY — ARTHROPLASTY, KNEE, TOTAL
Anesthesia: General | Site: Knee | Laterality: Left

## 2022-10-01 DIAGNOSIS — M79675 Pain in left toe(s): Secondary | ICD-10-CM | POA: Diagnosis not present

## 2022-10-01 DIAGNOSIS — M779 Enthesopathy, unspecified: Secondary | ICD-10-CM | POA: Diagnosis not present

## 2022-10-01 DIAGNOSIS — S93332D Other subluxation of left foot, subsequent encounter: Secondary | ICD-10-CM | POA: Diagnosis not present

## 2022-10-01 DIAGNOSIS — S93331D Other subluxation of right foot, subsequent encounter: Secondary | ICD-10-CM | POA: Diagnosis not present

## 2022-10-01 DIAGNOSIS — M79672 Pain in left foot: Secondary | ICD-10-CM | POA: Diagnosis not present

## 2022-10-01 DIAGNOSIS — M79674 Pain in right toe(s): Secondary | ICD-10-CM | POA: Diagnosis not present

## 2022-10-01 DIAGNOSIS — M79671 Pain in right foot: Secondary | ICD-10-CM | POA: Diagnosis not present

## 2022-10-08 DIAGNOSIS — M1990 Unspecified osteoarthritis, unspecified site: Secondary | ICD-10-CM | POA: Diagnosis not present

## 2022-10-24 DIAGNOSIS — R070 Pain in throat: Secondary | ICD-10-CM | POA: Diagnosis not present

## 2022-10-24 DIAGNOSIS — U071 COVID-19: Secondary | ICD-10-CM | POA: Diagnosis not present

## 2022-10-24 DIAGNOSIS — Z299 Encounter for prophylactic measures, unspecified: Secondary | ICD-10-CM | POA: Diagnosis not present

## 2022-11-03 DIAGNOSIS — R0981 Nasal congestion: Secondary | ICD-10-CM | POA: Diagnosis not present

## 2022-11-03 DIAGNOSIS — Z299 Encounter for prophylactic measures, unspecified: Secondary | ICD-10-CM | POA: Diagnosis not present

## 2022-11-03 DIAGNOSIS — J069 Acute upper respiratory infection, unspecified: Secondary | ICD-10-CM | POA: Diagnosis not present

## 2022-12-23 ENCOUNTER — Telehealth: Payer: Self-pay | Admitting: Cardiology

## 2022-12-23 NOTE — Telephone Encounter (Signed)
Patient notified and verbalized understanding.  Declines going to the ED.  Appointment scheduled for Thursday, 12/25/22 with Finis Bud, NP in [place] office.  States she is a widow and lives alone, children are in [place], but does have 2 bonus daughters here.  Advised if symptoms worsen to call 911 or have one of her bonus daughters to take her to ED.  She verbalized understanding.

## 2022-12-23 NOTE — Telephone Encounter (Signed)
Patient c/o Palpitations:  High priority if patient c/o lightheadedness, shortness of breath, or chest pain  How long have you had palpitations/irregular HR/ Afib? Are you having the symptoms now?   No  Are you currently experiencing lightheadedness, SOB or CP?   Lightheaded, no SOB  Do you have a history of afib (atrial fibrillation) or irregular heart rhythm?   Yes  Have you checked your BP or HR? (document readings if available):   BP 99/69  HR 89  Are you experiencing any other symptoms? Headache   Patient stated her chest feels strange and she feels faint-like.

## 2022-12-23 NOTE — Telephone Encounter (Signed)
Spoke with patient - states that she feels like her chest is tight, fatigue and faint feeling.  No c/o SOB.   States that she had afib episode last evening - she did take her prn Lopressor which brought her HR back down.    She does not feel like she is in afib now.  Did wake up with headache this morning and just generally not feeling good.   95/55 87/68  121 92/68  81

## 2022-12-25 ENCOUNTER — Encounter: Payer: Self-pay | Admitting: Nurse Practitioner

## 2022-12-25 ENCOUNTER — Ambulatory Visit: Payer: Medicare Other | Attending: Nurse Practitioner | Admitting: Nurse Practitioner

## 2022-12-25 VITALS — BP 130/90 | HR 68 | Ht 62.5 in | Wt 181.2 lb

## 2022-12-25 DIAGNOSIS — R5383 Other fatigue: Secondary | ICD-10-CM

## 2022-12-25 DIAGNOSIS — I480 Paroxysmal atrial fibrillation: Secondary | ICD-10-CM | POA: Diagnosis not present

## 2022-12-25 DIAGNOSIS — Z79899 Other long term (current) drug therapy: Secondary | ICD-10-CM

## 2022-12-25 DIAGNOSIS — R002 Palpitations: Secondary | ICD-10-CM | POA: Diagnosis not present

## 2022-12-25 DIAGNOSIS — I10 Essential (primary) hypertension: Secondary | ICD-10-CM

## 2022-12-25 DIAGNOSIS — E039 Hypothyroidism, unspecified: Secondary | ICD-10-CM | POA: Diagnosis not present

## 2022-12-25 DIAGNOSIS — E785 Hyperlipidemia, unspecified: Secondary | ICD-10-CM

## 2022-12-25 MED ORDER — METOPROLOL TARTRATE 25 MG PO TABS: 25.0000 mg | ORAL_TABLET | Freq: Two times a day (BID) | ORAL | 2 refills | Status: DC | PRN

## 2022-12-25 NOTE — Patient Instructions (Addendum)
Medication Instructions:  Your physician recommends that you continue on your current medications as directed. Please refer to the Current Medication list given to you today. You may take an additional metoprolol tablet as needed for palpitations  Labwork: CMET, CBC, Thyroid Panel and Magnesium Level  Non-fasting [facility]   Testing/Procedures: none  Follow-Up: Your physician recommends that you schedule a follow-up appointment in: as planned  Any Other Special Instructions Will Be Listed Below (If Applicable).  If you need a refill on your cardiac medications before your next appointment, please call your pharmacy.

## 2022-12-25 NOTE — Progress Notes (Signed)
Cardiology Office Note:    Date:  12/25/2022  ID:  [name], DOB 1946-01-06, MRN [id number]  PCP:  Deborah Chroman, MD   [place] [facility] Cardiologist:  Deborah Lesches, MD     Referring MD: Deborah Chroman, MD   CC: A-fib/ palpitations  History of Present Illness:    [name] is a 77 y.o. female with a hx of the following:  PAF Hypertension Hypothyroidism Hyperlipidemia Depression, anxiety  Patient is a delightful 77 year old female with past medical history as mentioned above.  Last seen by [name] on July 30, 2022.  She was doing overall very well.  She had some episodes of A-fib noted in July 2023 that improved with her as needed dose of metoprolol.  Was tolerating Eliquis well, denied any bleeding issues.  No medication changes were made.  Was told to follow-up in 6 months.  She contacted our office this week noting irregular heart rhythm with blood pressure of 99/69, heart rate was 89.  She told the office nurse that chest was feeling tight, felt fatigued and feeling faint like, denied any shortness of breath.  Patient noted that she had A-fib episode and took her as needed Lopressor which brought heart rate back down.  [name] recommended to be seen in the ER via EMS to assess for more acute cardiac process.  Patient declined to go to the ED and requested office appointment.  Today she presents for evaluation.  She states she has episodes of A-fib about once per month. Monday evening, HR was noted to be around 130 with max of 150's. During these episodes, chest becomes sore. Said Lopressor helped her symptoms and resolved symptoms, denies any recurrence in symptoms. Today, she says she feels well. Denies any anginal chest pain, shortness of breath, syncope, presyncope, dizziness, orthopnea, PND, swelling or significant weight changes, acute bleeding, or claudication. Admits to fatigue.  SH: In her free time, she enjoys crossword puzzles,  reading, and Bible studies.    Past Medical History:  Diagnosis Date   Anxiety    Arthritis    Atrial fibrillation [facility])    Documented December 2019   Depression    GERD (gastroesophageal reflux disease)    Heart murmur    Hyperlipidemia    Hypertension    Hypothyroidism     Past Surgical History:  Procedure Laterality Date   APPENDECTOMY     BUNIONECTOMY Bilateral    CATARACT EXTRACTION W/PHACO Right 10/09/2017   Procedure: CATARACT EXTRACTION PHACO AND INTRAOCULAR LENS PLACEMENT ([place]);  Surgeon: [name], MD;  [facility];  Service: Ophthalmology;  Laterality: Right;  CDE: 6.65   CATARACT EXTRACTION W/PHACO Left 12/11/2017   Procedure: CATARACT EXTRACTION PHACO AND INTRAOCULAR LENS PLACEMENT ([facility]);  Surgeon: [name], MD;  Location[facility];  Service: Ophthalmology;  Laterality: Left;  CDE: 5.28   COLONOSCOPY  06/18/2012   Procedure: COLONOSCOPY;  Surgeon: [name], MD;  [facility];  Service: Endoscopy;  Laterality: N/A;  730   COLONOSCOPY N/A 07/04/2019   Rehman: one 7 mm polyp in proximal ascending colon, removed, clips placed to treat postpolypectomy bleed (tubular adenoma), diverticulosis in sigmoid colon, hepatic flexure, external hemorrhoids   ESOPHAGOGASTRODUODENOSCOPY     Hammer toe repair Left    HEMORRHOID SURGERY     KNEE ARTHROSCOPY     left 06-2019   REVERSE SHOULDER ARTHROPLASTY Right 07/12/2021   Procedure: REVERSE SHOULDER ARTHROPLASTY;  Surgeon: [name], MD;  [facility];  Service: Orthopedics;  Laterality: Right;  with ISB   SHOULDER ARTHROSCOPY     right   THYROIDECTOMY     TONSILLECTOMY     TOTAL ABDOMINAL HYSTERECTOMY     TOTAL KNEE ARTHROPLASTY Right 06/29/2020   Procedure: TOTAL KNEE ARTHROPLASTY;  Surgeon: [name], MD;  [facility];  Service: Orthopedics;  Laterality: Right;  adductor canal   TOTAL SHOULDER ARTHROPLASTY Left 02/06/2017   TOTAL SHOULDER ARTHROPLASTY Left 02/06/2017    Procedure: TOTAL SHOULDER ARTHROPLASTY;  Surgeon: [name], MD;  Location: [place];  Service: Orthopedics;  Laterality: Left;    Current Medications: Current Meds  Medication Sig   acetaminophen (TYLENOL) 500 MG tablet Take 1,000 mg by mouth every 6 (six) hours as needed (pain.).    apixaban (ELIQUIS) 5 MG TABS tablet Take 1 tablet (5 mg total) by mouth 2 (two) times daily.   Calcium Carbonate (CALCIUM 600 PO) Take 2 tablets by mouth daily.   citalopram (CELEXA) 20 MG tablet Take 20 mg by mouth daily.   diltiazem (CARDIZEM CD) 240 MG 24 hr capsule Take 240 mg by mouth daily.   Lactobacillus-Inulin (PROBIOTIC DIGESTIVE SUPPORT) CAPS Take 2 capsules by mouth daily.   levothyroxine (SYNTHROID, LEVOTHROID) 75 MCG tablet Take 75 mcg by mouth daily before breakfast.    MAGNESIUM PO Take 266 mg by mouth daily.   Multiple Vitamins-Minerals (MULTIVITAMIN WITH MINERALS) tablet Take 1 tablet by mouth daily. One A Day for Women   simvastatin (ZOCOR) 10 MG tablet Take 10 mg by mouth daily.   spironolactone (ALDACTONE) 25 MG tablet TAKE 1 TABLET BY MOUTH  DAILY   Vitamin D3 (VITAMIN D) 25 MCG tablet Take 2,000 Units by mouth daily.   Wheat Dextrin (BENEFIBER) POWD Take 2 teaspoons daily mixed in beverage of choice.   metoprolol tartrate (LOPRESSOR) 25 MG tablet Take 25 mg by mouth daily as needed.     Allergies:   Other, Latex, Penicillins, Sulfa antibiotics, and Tramadol   Social History   Socioeconomic History   Marital status: Widowed    Spouse name: Not on file   Number of children: Not on file   Years of education: Not on file   Highest education level: Not on file  Occupational History   Not on file  Tobacco Use   Smoking status: Never    Passive exposure: Never   Smokeless tobacco: Never  Vaping Use   Vaping Use: Never used  Substance and Sexual Activity   Alcohol use: No    Alcohol/week: 0.0 standard drinks of alcohol   Drug use: No   Sexual activity: Not Currently    Birth  control/protection: Surgical  Other Topics Concern   Not on file  Social History Narrative   Not on file   Social Determinants of Health   Financial Resource Strain: Not on file  Food Insecurity: Not on file  Transportation Needs: Not on file  Physical Activity: Not on file  Stress: Not on file  Social Connections: Not on file     Family History: The patient's family history includes Atrial fibrillation in her sister. There is no history of Colon cancer, Breast cancer, Parkinson's disease, or Tremor.  ROS:   Review of Systems  Constitutional: Negative.   HENT: Negative.    Eyes: Negative.   Respiratory: Negative.    Cardiovascular:  Positive for palpitations. Negative for chest pain, orthopnea, claudication, leg swelling and PND.       See HPI.  Gastrointestinal: Negative.   Genitourinary: Negative.   Musculoskeletal: Negative.   Skin: Negative.   Neurological: Negative.   Endo/Heme/Allergies: Negative.   Psychiatric/Behavioral: Negative.      Please see the history of present illness.    All other systems reviewed and are negative.  EKGs/Labs/Other Studies Reviewed:    The following studies were reviewed today:   EKG:  EKG is ordered today.  The ekg ordered today demonstrates normal sinus rhythm, 70 bpm, nonspecific ST segment changes, otherwise nothing acute.  Echocardiogram on 11/14/2018: Study Conclusions   - Left ventricle: The cavity size was normal. Wall thickness was    increased in a pattern of moderate LVH. Systolic function was    normal. The estimated ejection fraction was in the range of 55%    to 60%. Wall motion was normal; there were no regional wall    motion abnormalities. Features are consistent with a pseudonormal    left ventricular filling pattern, with concomitant abnormal    relaxation and increased filling pressure (grade 2 diastolic    dysfunction).   Impressions:   - Normal LV systolic function; moderate diastolic dysfunction;     moderate LVH.   Recent Labs: 12/26/2022: ALT 16; BUN 10; Creatinine, Ser 0.96; Hemoglobin 16.0; Magnesium 2.2; Platelets 290; Potassium 4.9; Sodium 138; TSH 1.620  Recent Lipid Panel    Component Value Date/Time   CHOL 117 11/15/2018 0413   TRIG 102 11/15/2018 0413   HDL 39 (L) 11/15/2018 0413   CHOLHDL 3.0 11/15/2018 0413   VLDL 20 11/15/2018 0413   LDLCALC 58 11/15/2018 0413     Risk Assessment/Calculations:    CHA2DS2-VASc Score = 4  This indicates a 4.8% annual risk of stroke. The patient's score is based upon: CHF History: 0 HTN History: 1 Diabetes History: 0 Stroke History: 0 Vascular Disease History: 0 Age Score: 2 Gender Score: 1    STOP-Bang Score:  3  {   Physical Exam:    VS:  BP (!) 130/90   Pulse 68   Ht 5' 2.5" (1.588 m)   Wt 181 lb 3.2 oz (82.2 kg)   SpO2 98%   BMI 32.61 kg/m     Wt Readings from Last 3 Encounters:  12/25/22 181 lb 3.2 oz (82.2 kg)  07/30/22 179 lb (81.2 kg)  03/13/22 180 lb (81.6 kg)     GEN: Obese, 77 y.o. female in no acute distress HEENT: Normal NECK: No JVD; No carotid bruits CARDIAC: S1/S2, RRR, no murmurs, rubs, gallops; 2+ pulses RESPIRATORY:  Clear to auscultation without rales, wheezing or rhonchi  MUSCULOSKELETAL:  No edema; No deformity  SKIN: Warm and dry NEUROLOGIC:  Alert and oriented x 3 PSYCHIATRIC:  Normal affect   ASSESSMENT:    1. Paroxysmal atrial fibrillation (HCC)   2. Palpitations   3. Medication management   4. Hypertension, unspecified type   5. Hyperlipidemia, unspecified hyperlipidemia type   6. Hypothyroidism, unspecified type   7. Fatigue, unspecified type    PLAN:    In order of problems listed above:  PAF, palpitations Admits to episode occurring about once per month.  Recent episode on Monday evening, was resolved after taking Lopressor.  Admits to some chest tightness during these episodes.  Denies any anginal symptoms.  Recommended titrating diltiazem to 300 mg daily, however  she politely declined to do this.  Discussed that she may take Lopressor 25 mg BID PRN for palpitations.  Discussed avoiding triggers to A-fib and recommended [facility] mobile app.  Will obtain the following labs: CBC, CMET, Magnesium, and thyroid panel. Heart healthy diet and regular cardiovascular exercise encouraged.  Continue current medication regimen.  ED precautions discussed.   HTN BP stable.  Continue current medication regimen. Discussed to monitor BP at home at least 2 hours after medications and sitting for 5-10 minutes. Heart healthy diet and regular cardiovascular exercise encouraged.   HLD LDL 86 in 2023.  Being managed by PCP.  Continue simvastatin. Heart healthy diet and regular cardiovascular exercise encouraged.   Hypothyroidism No recent labs on profile.  Will obtain thyroid panel.  Continue levothyroxine. Continue to follow with PCP.  5. Fatigue Etiology multifactorial. Stop-Bang score is 3. Politely declines sleep study at this time. Will obtain the lab work as mentioned above.    6. Disposition: Follow-up with me or APP in 4 to 6 weeks or sooner if anything changes.  Medication Adjustments/Labs and Tests Ordered: Current medicines are reviewed at length with the patient today.  Concerns regarding medicines are outlined above.  Orders Placed This Encounter  Procedures   Comprehensive metabolic panel   CBC   Magnesium   Thyroid Panel With TSH   Comprehensive metabolic panel   CBC   Magnesium   EKG 12-Lead   Meds ordered this encounter  Medications   metoprolol tartrate (LOPRESSOR) 25 MG tablet    Sig: Take 1 tablet (25 mg total) by mouth 2 (two) times daily as needed (palpitations).    Dispense:  60 tablet    Refill:  2    Patient Instructions  Medication Instructions:  Your physician recommends that you continue on your current medications as directed. Please refer to the Current Medication list given to you today. You may take an additional metoprolol tablet  as needed for palpitations  Labwork: CMET, CBC, Thyroid Panel and Magnesium Level  Non-fasting [facility]   Testing/Procedures: none  Follow-Up: Your physician recommends that you schedule a follow-up appointment in: as planned  Any Other Special Instructions Will Be Listed Below (If Applicable).  If you need a refill on your cardiac medications before your next appointment, please call your pharmacy.   Signed, Finis Bud, NP  12/27/2022 1:21 PM    [place]

## 2022-12-26 DIAGNOSIS — R002 Palpitations: Secondary | ICD-10-CM | POA: Diagnosis not present

## 2022-12-26 DIAGNOSIS — I480 Paroxysmal atrial fibrillation: Secondary | ICD-10-CM | POA: Diagnosis not present

## 2022-12-26 DIAGNOSIS — Z79899 Other long term (current) drug therapy: Secondary | ICD-10-CM | POA: Diagnosis not present

## 2022-12-27 LAB — CBC
Hematocrit: 48.8 % — ABNORMAL HIGH (ref 34.0–46.6)
Hemoglobin: 16 g/dL — ABNORMAL HIGH (ref 11.1–15.9)
MCH: 30.7 pg (ref 26.6–33.0)
MCHC: 32.8 g/dL (ref 31.5–35.7)
MCV: 94 fL (ref 79–97)
Platelets: 290 10*3/uL (ref 150–450)
RBC: 5.22 x10E6/uL (ref 3.77–5.28)
RDW: 12.3 % (ref 11.7–15.4)
WBC: 7.4 10*3/uL (ref 3.4–10.8)

## 2022-12-27 LAB — COMPREHENSIVE METABOLIC PANEL
ALT: 16 IU/L (ref 0–32)
AST: 20 IU/L (ref 0–40)
Albumin/Globulin Ratio: 2.3 — ABNORMAL HIGH (ref 1.2–2.2)
Albumin: 4.6 g/dL (ref 3.8–4.8)
Alkaline Phosphatase: 78 IU/L (ref 44–121)
BUN/Creatinine Ratio: 10 — ABNORMAL LOW (ref 12–28)
BUN: 10 mg/dL (ref 8–27)
Bilirubin Total: 0.9 mg/dL (ref 0.0–1.2)
CO2: 22 mmol/L (ref 20–29)
Calcium: 10.6 mg/dL — ABNORMAL HIGH (ref 8.7–10.3)
Chloride: 99 mmol/L (ref 96–106)
Creatinine, Ser: 0.96 mg/dL (ref 0.57–1.00)
Globulin, Total: 2 g/dL (ref 1.5–4.5)
Glucose: 93 mg/dL (ref 70–99)
Potassium: 4.9 mmol/L (ref 3.5–5.2)
Sodium: 138 mmol/L (ref 134–144)
Total Protein: 6.6 g/dL (ref 6.0–8.5)
eGFR: 61 mL/min/{1.73_m2} (ref >59)

## 2022-12-27 LAB — THYROID PANEL WITH TSH
Free Thyroxine Index: 2.6 (ref 1.2–4.9)
T3 Uptake Ratio: 28 % (ref 24–39)
T4, Total: 9.3 ug/dL (ref 4.5–12.0)
TSH: 1.62 u[IU]/mL (ref 0.450–4.500)

## 2022-12-27 LAB — MAGNESIUM: Magnesium: 2.2 mg/dL (ref 1.6–2.3)

## 2023-01-08 ENCOUNTER — Ambulatory Visit (INDEPENDENT_AMBULATORY_CARE_PROVIDER_SITE_OTHER): Payer: Medicare Other | Admitting: Gastroenterology

## 2023-01-08 ENCOUNTER — Encounter: Payer: Self-pay | Admitting: Gastroenterology

## 2023-01-08 VITALS — BP 134/84 | HR 69 | Temp 98.1°F | Ht 62.5 in | Wt 179.2 lb

## 2023-01-08 DIAGNOSIS — K921 Melena: Secondary | ICD-10-CM | POA: Diagnosis not present

## 2023-01-08 DIAGNOSIS — K5900 Constipation, unspecified: Secondary | ICD-10-CM

## 2023-01-08 NOTE — Patient Instructions (Signed)
You can continue that cream from [facility] as needed.  I recommend taking Miralax every day. Please call if this is not helpful!  Avoid straining, and make sure to limit toilet time to 2-3 minutes on the toilet.  We will see you in 3 months!  I enjoyed seeing you again today! At our first visit, I mentioned how I value our relationship and want to provide genuine, compassionate, and quality care. You may receive a survey regarding your visit with me, and I welcome your feedback! Thanks so much for taking the time to complete this. I look forward to seeing you again.   Annitta Needs, PhD, ANP-BC [facility]

## 2023-01-08 NOTE — Progress Notes (Addendum)
Gastroenterology Office Note     Primary Care Physician:  [name], MD  Primary Gastroenterologist: previously Dr. Laural Golden, now Dr. Jenetta Downer    Chief Complaint   Chief Complaint  Patient presents with   Constipation    Having issues with constipation and is having to strain. Occasionally sees blood when she wipes.      History of Present Illness   [name] is a 77 y.o. female presenting today in follow-up with a history of chronic constipation, anal fissure, internal hemorrhoids, returning with constipation and blood with wiping.  Last seen in April 2023 for consideration of banding; she was not a candidate at that time as she was dealing with an anal fissure. Chronically anticoagulated. We discussed holding off banding unless significant bleeding.   Will feel BMs moving down then can't get it through. Will see blood at times. Taking Benefiber, extra fiber, probiotic, stool softeners. Will try Miralax but doesn't help for several days. Doesn't want another medication to take every day. Straining.    Has a boil on one part of vagina.     Colonoscopy 2020: tubular adenoma, diverticulosis     Past Medical History:  Diagnosis Date   Anxiety    Arthritis    Atrial fibrillation [facility])    Documented December 2019   Depression    GERD (gastroesophageal reflux disease)    Heart murmur    Hyperlipidemia    Hypertension    Hypothyroidism     Past Surgical History:  Procedure Laterality Date   APPENDECTOMY     BUNIONECTOMY Bilateral    CATARACT EXTRACTION W/PHACO Right 10/09/2017   Procedure: CATARACT EXTRACTION PHACO AND INTRAOCULAR LENS PLACEMENT ([place]);  Surgeon: [name], MD;  Location: [facility];  Service: Ophthalmology;  Laterality: Right;  CDE: 6.65   CATARACT EXTRACTION W/PHACO Left 12/11/2017   Procedure: CATARACT EXTRACTION PHACO AND INTRAOCULAR LENS PLACEMENT ([facility]);  Surgeon: [name], MD;  Location: [facility];  Service: Ophthalmology;   Laterality: Left;  CDE: 5.28   COLONOSCOPY  06/18/2012   Procedure: COLONOSCOPY;  Surgeon: [name], MD;  [facility];  Service: Endoscopy;  Laterality: N/A;  730   COLONOSCOPY N/A 07/04/2019   Rehman: one 7 mm polyp in proximal ascending colon, removed, clips placed to treat postpolypectomy bleed (tubular adenoma), diverticulosis in sigmoid colon, hepatic flexure, external hemorrhoids   ESOPHAGOGASTRODUODENOSCOPY     Hammer toe repair Left    HEMORRHOID SURGERY     KNEE ARTHROSCOPY     left 06-2019   REVERSE SHOULDER ARTHROPLASTY Right 07/12/2021   Procedure: REVERSE SHOULDER ARTHROPLASTY;  Surgeon: [name], MD;  Location: [facility];  Service: Orthopedics;  Laterality: Right;  with ISB   SHOULDER ARTHROSCOPY     right   THYROIDECTOMY     TONSILLECTOMY     TOTAL ABDOMINAL HYSTERECTOMY     TOTAL KNEE ARTHROPLASTY Right 06/29/2020   Procedure: TOTAL KNEE ARTHROPLASTY;  Surgeon: [name], MD;  [facility];  Service: Orthopedics;  Laterality: Right;  adductor canal   TOTAL SHOULDER ARTHROPLASTY Left 02/06/2017   TOTAL SHOULDER ARTHROPLASTY Left 02/06/2017   Procedure: TOTAL SHOULDER ARTHROPLASTY;  Surgeon: [name], MD;  Location: [place];  Service: Orthopedics;  Laterality: Left;    Current Outpatient Medications  Medication Sig Dispense Refill   acetaminophen (TYLENOL) 500 MG tablet Take 1,000 mg by mouth every 6 (six) hours as needed (pain.).      apixaban (ELIQUIS) 5 MG TABS  tablet Take 1 tablet (5 mg total) by mouth 2 (two) times daily. 28 tablet 0   Calcium Carbonate (CALCIUM 600 PO) Take 2 tablets by mouth daily.     citalopram (CELEXA) 20 MG tablet Take 20 mg by mouth daily.     diltiazem (CARDIZEM CD) 240 MG 24 hr capsule Take 240 mg by mouth daily.     Lactobacillus-Inulin (PROBIOTIC DIGESTIVE SUPPORT) CAPS Take 2 capsules by mouth daily.     levothyroxine (SYNTHROID, LEVOTHROID) 75 MCG tablet Take 75 mcg by mouth daily before breakfast.       MAGNESIUM PO Take 266 mg by mouth daily.     metoprolol tartrate (LOPRESSOR) 25 MG tablet Take 1 tablet (25 mg total) by mouth 2 (two) times daily as needed (palpitations). 60 tablet 2   Multiple Vitamins-Minerals (MULTIVITAMIN WITH MINERALS) tablet Take 1 tablet by mouth daily. One A Day for Women     simvastatin (ZOCOR) 10 MG tablet Take 10 mg by mouth daily.     spironolactone (ALDACTONE) 25 MG tablet TAKE 1 TABLET BY MOUTH  DAILY 90 tablet 1   Vitamin D3 (VITAMIN D) 25 MCG tablet Take 2,000 Units by mouth daily.     Wheat Dextrin (BENEFIBER) POWD Take 2 teaspoons daily mixed in beverage of choice. 350 g 3   No current facility-administered medications for this visit.    Allergies as of 01/08/2023 - Review Complete 01/08/2023  Allergen Reaction Noted   Other Nausea And Vomiting 07/02/2021   Latex Rash 01/27/2017   Penicillins Rash and Other (See Comments) 06/15/2012   Sulfa antibiotics Rash 06/15/2012   Tramadol Itching 10/05/2018    Family History  Problem Relation Age of Onset   Atrial fibrillation Sister    Colon cancer Neg Hx    Breast cancer Neg Hx    Parkinson's disease Neg Hx    Tremor Neg Hx     Social History   Socioeconomic History   Marital status: Widowed    Spouse name: Not on file   Number of children: Not on file   Years of education: Not on file   Highest education level: Not on file  Occupational History   Not on file  Tobacco Use   Smoking status: Never    Passive exposure: Never   Smokeless tobacco: Never  Vaping Use   Vaping Use: Never used  Substance and Sexual Activity   Alcohol use: No    Alcohol/week: 0.0 standard drinks of alcohol   Drug use: No   Sexual activity: Not Currently    Birth control/protection: Surgical  Other Topics Concern   Not on file  Social History Narrative   Not on file   Social Determinants of Health   Financial Resource Strain: Not on file  Food Insecurity: Not on file  Transportation Needs: Not on file   Physical Activity: Not on file  Stress: Not on file  Social Connections: Not on file  Intimate Partner Violence: Not on file     Review of Systems   Gen: Denies any fever, chills, fatigue, weight loss, lack of appetite.  CV: Denies chest pain, heart palpitations, peripheral edema, syncope.  Resp: Denies shortness of breath at rest or with exertion. Denies wheezing or cough.  GI: Denies dysphagia or odynophagia. Denies jaundice, hematemesis, fecal incontinence. GU : Denies urinary burning, urinary frequency, urinary hesitancy MS: Denies joint pain, muscle weakness, cramps, or limitation of movement.  Derm: Denies rash, itching, dry skin Psych: Denies depression, anxiety, memory  loss, and confusion Heme: Denies bruising, bleeding, and enlarged lymph nodes.   Physical Exam   BP 134/84 (BP Location: Right Arm, Patient Position: Sitting, Cuff Size: Large)   Pulse 69   Temp 98.1 F (36.7 C) (Oral)   Ht 5' 2.5" (1.588 m)   Wt 179 lb 3.2 oz (81.3 kg)   SpO2 96%   BMI 32.25 kg/m  General:   Alert and oriented. Pleasant and cooperative. Well-nourished and well-developed.  Head:  Normocephalic and atraumatic. Eyes:  Without icterus Rectal:  small external hemorrhoid tags, DRE without mass, no pain  Msk:  Symmetrical without gross deformities. Normal posture. Extremities:  Without edema. Neurologic:  Alert and  oriented x4;  grossly normal neurologically. Skin:  Intact without significant lesions or rashes. Psych:  Alert and cooperative. Normal mood and affect.   Assessment   [name] is a 77 y.o. female presenting today in follow-up with a history of chronic constipation, anal fissure, internal hemorrhoids, returning with constipation and blood with wiping.  Constipation: not ideally managed with fiber and stool softeners. She does not want to try prescriptive agents at this time. Will add Miralax to take daily.  Rectal bleeding: due to hemorrhoids. Clinically, fissure  is healed. No obvious fissure on exam. She still has cream from [facility] she may use. Would hold off on banding as she is on anticoagulation unless she has significant bleeding despite changing bowel regimen and avoidance of straining/constipation. Colonoscopy 2020: tubular adenoma, diverticulosis    PLAN   Compounded cream as needed from [facility] daily to regimen. Continue stool softeners and fiber Return in 3 months   Annitta Needs, PhD, ANP-BC [facility]   I have reviewed the note and agree with the APP's assessment as described in this progress note  Maylon Peppers, MD Gastroenterology and [facility]

## 2023-01-21 DIAGNOSIS — M25511 Pain in right shoulder: Secondary | ICD-10-CM | POA: Diagnosis not present

## 2023-01-21 DIAGNOSIS — I10 Essential (primary) hypertension: Secondary | ICD-10-CM | POA: Diagnosis not present

## 2023-01-21 DIAGNOSIS — Z299 Encounter for prophylactic measures, unspecified: Secondary | ICD-10-CM | POA: Diagnosis not present

## 2023-02-10 ENCOUNTER — Ambulatory Visit: Payer: Medicare Other | Admitting: Cardiology

## 2023-02-10 ENCOUNTER — Ambulatory Visit: Payer: Medicare Other | Attending: Cardiology | Admitting: Nurse Practitioner

## 2023-02-10 ENCOUNTER — Encounter: Payer: Self-pay | Admitting: Nurse Practitioner

## 2023-02-10 VITALS — BP 126/84 | HR 76 | Ht 62.5 in | Wt 178.6 lb

## 2023-02-10 DIAGNOSIS — I10 Essential (primary) hypertension: Secondary | ICD-10-CM

## 2023-02-10 DIAGNOSIS — I480 Paroxysmal atrial fibrillation: Secondary | ICD-10-CM | POA: Diagnosis not present

## 2023-02-10 DIAGNOSIS — E039 Hypothyroidism, unspecified: Secondary | ICD-10-CM

## 2023-02-10 DIAGNOSIS — E785 Hyperlipidemia, unspecified: Secondary | ICD-10-CM

## 2023-02-10 DIAGNOSIS — R5383 Other fatigue: Secondary | ICD-10-CM | POA: Diagnosis not present

## 2023-02-10 DIAGNOSIS — R002 Palpitations: Secondary | ICD-10-CM

## 2023-02-10 MED ORDER — DILTIAZEM HCL ER COATED BEADS 240 MG PO CP24: 240.0000 mg | ORAL_CAPSULE | Freq: Every day | ORAL | 1 refills | Status: DC

## 2023-02-10 NOTE — Patient Instructions (Addendum)
Medication Instructions:  Your physician recommends that you continue on your current medications as directed. Please refer to the Current Medication list given to you today.   Labwork: none  Testing/Procedures: Your physician has recommended that you wear an event monitor. Event monitors are medical devices that record the heart's electrical activity. Doctors most often [place] these monitors to diagnose arrhythmias. Arrhythmias are problems with the speed or rhythm of the heartbeat. The monitor is a small, portable device. You can wear one while you do your normal daily activities. This is usually used to diagnose what is causing palpitations/syncope (passing out).   Follow-Up:  Your physician recommends that you schedule a follow-up appointment in: 2-3 months  Any Other Special Instructions Will Be Listed Below (If Applicable).  If you need a refill on your cardiac medications before your next appointment, please call your pharmacy.

## 2023-02-10 NOTE — Progress Notes (Addendum)
Cardiology Office Note:    Date:  02/10/2023  ID:  [name], DOB [birth year], MRN [id number]  PCP:  [name], MD   [place] [facility] Cardiologist:  Rozann Lesches, MD     Referring MD: [name], MD   CC: A-fib/ palpitations  History of Present Illness:    [name] is a delightful 77 y.o. female with a hx of the following:  PAF Hypertension Hypothyroidism Hyperlipidemia Depression, anxiety  Last seen by [name] on July 30, 2022.  She was doing overall very well.    Saw her 12/2022 for evaluation for A-fib, episodes about once per month. During these episodes, chest became sore.   Today she presents for follow-up. Has had 2 episodes of A-fib since I last saw her. Longest episode lasted 4 hours. Does admit to difficulty getting her current dose of Diltiazem from mail order. Denies any chest pain, shortness of breath, syncope, presyncope, dizziness, orthopnea, PND, swelling or significant weight changes, acute bleeding, or claudication.  SH: In her free time, she enjoys crossword puzzles, reading, and Bible studies.  Past Medical History:  Diagnosis Date   Anxiety    Arthritis    Atrial fibrillation [facility])    Documented December 2019   Depression    GERD (gastroesophageal reflux disease)    Heart murmur    Hyperlipidemia    Hypertension    Hypothyroidism     Past Surgical History:  Procedure Laterality Date   APPENDECTOMY     BUNIONECTOMY Bilateral    CATARACT EXTRACTION W/PHACO Right 10/09/2017   Procedure: CATARACT EXTRACTION PHACO AND INTRAOCULAR LENS PLACEMENT ([place]);  Surgeon: [name], MD;  [facility]: [facility];  Service: Ophthalmology;  Laterality: Right;  CDE: 6.65   CATARACT EXTRACTION W/PHACO Left 12/11/2017   Procedure: CATARACT EXTRACTION PHACO AND INTRAOCULAR LENS PLACEMENT ([facility]);  Surgeon: [name], MD;  Location: [facility];  Service: Ophthalmology;  Laterality: Left;  CDE: 5.28   COLONOSCOPY  06/18/2012    Procedure: COLONOSCOPY;  Surgeon: [name], MD;  [facility];  Service: Endoscopy;  Laterality: N/A;  730   COLONOSCOPY N/A 07/04/2019   Rehman: one 7 mm polyp in proximal ascending colon, removed, clips placed to treat postpolypectomy bleed (tubular adenoma), diverticulosis in sigmoid colon, hepatic flexure, external hemorrhoids   ESOPHAGOGASTRODUODENOSCOPY     Hammer toe repair Left    HEMORRHOID SURGERY     KNEE ARTHROSCOPY     left 06-2019   REVERSE SHOULDER ARTHROPLASTY Right 07/12/2021   Procedure: REVERSE SHOULDER ARTHROPLASTY;  Surgeon: [name], MD;  [facility];  Service: Orthopedics;  Laterality: Right;  with ISB   SHOULDER ARTHROSCOPY     right   THYROIDECTOMY     TONSILLECTOMY     TOTAL ABDOMINAL HYSTERECTOMY     TOTAL KNEE ARTHROPLASTY Right 06/29/2020   Procedure: TOTAL KNEE ARTHROPLASTY;  Surgeon: [name], MD;  [facility];  Service: Orthopedics;  Laterality: Right;  adductor canal   TOTAL SHOULDER ARTHROPLASTY Left 02/06/2017   TOTAL SHOULDER ARTHROPLASTY Left 02/06/2017   Procedure: TOTAL SHOULDER ARTHROPLASTY;  Surgeon: [name], MD;  Location: [place];  Service: Orthopedics;  Laterality: Left;   Current Meds  Medication Sig   acetaminophen (TYLENOL) 500 MG tablet Take 1,000 mg by mouth every 6 (six) hours as needed (pain.).    apixaban (ELIQUIS) 5 MG TABS tablet Take 1 tablet (5 mg total) by mouth 2 (two) times daily.   Calcium  Carbonate (CALCIUM 600 PO) Take 2 tablets by mouth daily.   citalopram (CELEXA) 20 MG tablet Take 20 mg by mouth daily.   Lactobacillus-Inulin (PROBIOTIC DIGESTIVE SUPPORT) CAPS Take 2 capsules by mouth daily.   levothyroxine (SYNTHROID, LEVOTHROID) 75 MCG tablet Take 75 mcg by mouth daily before breakfast.    MAGNESIUM PO Take 266 mg by mouth daily.   metoprolol tartrate (LOPRESSOR) 25 MG tablet Take 1 tablet (25 mg total) by mouth 2 (two) times daily as needed (palpitations).   Multiple  Vitamins-Minerals (MULTIVITAMIN WITH MINERALS) tablet Take 1 tablet by mouth daily. One A Day for Women   simvastatin (ZOCOR) 10 MG tablet Take 10 mg by mouth daily.   spironolactone (ALDACTONE) 25 MG tablet TAKE 1 TABLET BY MOUTH  DAILY   Vitamin D3 (VITAMIN D) 25 MCG tablet Take 2,000 Units by mouth daily.   Wheat Dextrin (BENEFIBER) POWD Take 2 teaspoons daily mixed in beverage of choice.   diltiazem (CARDIZEM CD) 240 MG 24 hr capsule Take 240 mg by mouth daily.    Allergies:   Other, Latex, Penicillins, Sulfa antibiotics, and Tramadol   Social History   Socioeconomic History   Marital status: Widowed    Spouse name: Not on file   Number of children: Not on file   Years of education: Not on file   Highest education level: Not on file  Occupational History   Not on file  Tobacco Use   Smoking status: Never    Passive exposure: Never   Smokeless tobacco: Never  Vaping Use   Vaping Use: Never used  Substance and Sexual Activity   Alcohol use: No    Alcohol/week: 0.0 standard drinks of alcohol   Drug use: No   Sexual activity: Not Currently    Birth control/protection: Surgical  Other Topics Concern   Not on file  Social History Narrative   Not on file   Social Determinants of Health   Financial Resource Strain: Not on file  Food Insecurity: Not on file  Transportation Needs: Not on file  Physical Activity: Not on file  Stress: Not on file  Social Connections: Not on file     Family History: The patient's family history includes Atrial fibrillation in her sister. There is no history of Colon cancer, Breast cancer, Parkinson's disease, or Tremor.  ROS:     Please see the history of present illness.    All other systems reviewed and are negative.  EKGs/Labs/Other Studies Reviewed:    The following studies were reviewed today:   EKG:  EKG is not ordered today.   Echocardiogram on 11/14/2018: Study Conclusions   - Left ventricle: The cavity size was  normal. Wall thickness was    increased in a pattern of moderate LVH. Systolic function was    normal. The estimated ejection fraction was in the range of 55%    to 60%. Wall motion was normal; there were no regional wall    motion abnormalities. Features are consistent with a pseudonormal    left ventricular filling pattern, with concomitant abnormal    relaxation and increased filling pressure (grade 2 diastolic    dysfunction).   Impressions:   - Normal LV systolic function; moderate diastolic dysfunction;    moderate LVH.   Recent Labs: 12/26/2022: ALT 16; BUN 10; Creatinine, Ser 0.96; Hemoglobin 16.0; Magnesium 2.2; Platelets 290; Potassium 4.9; Sodium 138; TSH 1.620  Recent Lipid Panel    Component Value Date/Time   CHOL 117 11/15/2018 0413  TRIG 102 11/15/2018 0413   HDL 39 (L) 11/15/2018 0413   CHOLHDL 3.0 11/15/2018 0413   VLDL 20 11/15/2018 0413   LDLCALC 58 11/15/2018 0413     Risk Assessment/Calculations:    CHA2DS2-VASc Score = 4  This indicates a 4.8% annual risk of stroke. The patient's score is based upon: CHF History: 0 HTN History: 1 Diabetes History: 0 Stroke History: 0 Vascular Disease History: 0 Age Score: 2 Gender Score: 1    STOP-Bang Score:  3  {   Physical Exam:    VS:  BP 126/84 (BP Location: Left Arm, Patient Position: Sitting, Cuff Size: Large)   Pulse 76   Ht 5' 2.5" (1.588 m)   Wt 178 lb 9.6 oz (81 kg)   SpO2 96%   BMI 32.15 kg/m     Wt Readings from Last 3 Encounters:  02/10/23 178 lb 9.6 oz (81 kg)  01/08/23 179 lb 3.2 oz (81.3 kg)  12/25/22 181 lb 3.2 oz (82.2 kg)     GEN: Obese, 77 y.o. female in no acute distress HEENT: Normal NECK: No JVD; No carotid bruits CARDIAC: S1/S2, RRR, no murmurs, rubs, gallops; 2+ pulses RESPIRATORY:  Clear to auscultation without rales, wheezing or rhonchi  MUSCULOSKELETAL:  No edema; No deformity  SKIN: Warm and dry NEUROLOGIC:  Alert and oriented x 3 PSYCHIATRIC:  Normal affect    ASSESSMENT:    1. PAF (paroxysmal atrial fibrillation) (HCC)   2. Palpitations   3. Essential hypertension, benign   4. Hyperlipidemia, unspecified hyperlipidemia type   5. Hypothyroidism, unspecified type   6. Fatigue, unspecified type    PLAN:    In order of problems listed above:  PAF, palpitations [place] to admit two episodes of A-fib, longest recent episode lasted around 4 hours. Admits to some chest tightness during these episodes.  Denies any anginal symptoms. Case d/w [name] who recommended to continue current medication regimen, pt agreeable with refilling Diltiazem at different pharmacy. Will arrange 30 day event monitor to evaluate episodes of tachycardia/A-fib. Discussed that she may take Lopressor 25 mg BID PRN for palpitations.  Discussed avoiding triggers to A-fib and previously recommended [employer] mobile app. Recent labs unremarkable. Heart healthy diet and regular cardiovascular exercise encouraged.  Continue current medication regimen.  ED precautions discussed. At next visit, consider A-fib clinic referral/EP referral if needed.   HTN BP stable.  Continue current medication regimen. Discussed to monitor BP at home at least 2 hours after medications and sitting for 5-10 minutes. Heart healthy diet and regular cardiovascular exercise encouraged.   HLD LDL 86 in 2023.  Being managed by PCP.  Continue simvastatin. Heart healthy diet and regular cardiovascular exercise encouraged.   Hypothyroidism Recent labs stable.  Continue levothyroxine. Continue to follow with PCP.  5. Fatigue Etiology multifactorial. Stop-Bang score is 3. Continues to politely decline sleep study at this time. Will revisit this at next OV.    6. Disposition: Follow-up with me or APP in 2-3 months or sooner if anything changes.  Medication Adjustments/Labs and Tests Ordered: Current medicines are reviewed at length with the patient today.  Concerns regarding medicines are outlined above.   Orders Placed This Encounter  Procedures   CARDIAC EVENT MONITOR   Meds ordered this encounter  Medications   diltiazem (CARDIZEM CD) 240 MG 24 hr capsule    Sig: Take 1 capsule (240 mg total) by mouth daily.    Dispense:  90 capsule    Refill:  1  Patient Instructions  Medication Instructions:  Your physician recommends that you continue on your current medications as directed. Please refer to the Current Medication list given to you today.   Labwork: none  Testing/Procedures: Your physician has recommended that you wear an event monitor. Event monitors are medical devices that record the heart's electrical activity. Doctors most often [place] these monitors to diagnose arrhythmias. Arrhythmias are problems with the speed or rhythm of the heartbeat. The monitor is a small, portable device. You can wear one while you do your normal daily activities. This is usually used to diagnose what is causing palpitations/syncope (passing out).   Follow-Up:  Your physician recommends that you schedule a follow-up appointment in: 2-3 months  Any Other Special Instructions Will Be Listed Below (If Applicable).  If you need a refill on your cardiac medications before your next appointment, please call your pharmacy.    Signed, Finis Bud, NP  02/10/2023 2:50 PM    [facility]

## 2023-02-16 DIAGNOSIS — H10013 Acute follicular conjunctivitis, bilateral: Secondary | ICD-10-CM | POA: Diagnosis not present

## 2023-02-17 ENCOUNTER — Ambulatory Visit: Payer: Medicare Other

## 2023-02-17 ENCOUNTER — Telehealth: Payer: Self-pay | Admitting: Nurse Practitioner

## 2023-02-17 NOTE — Telephone Encounter (Signed)
FYI-patient states that she received her monitor in the mail and needs assistance putting the monitor on. States that she is going out of town and will be back on 4/8. Request to come in once she returns to get assistance. Nurse visit scheduled for Tuesday, 4/9 @ 10:00 am.

## 2023-02-17 NOTE — Telephone Encounter (Signed)
Pt states she she would feel more comfortable if RN put the heart monitor on her. She's tates she is going out of town next week, but she would like to set up an appt to come in after that to have it put on.

## 2023-02-25 ENCOUNTER — Other Ambulatory Visit: Payer: Self-pay | Admitting: Nurse Practitioner

## 2023-02-25 DIAGNOSIS — I480 Paroxysmal atrial fibrillation: Secondary | ICD-10-CM

## 2023-03-03 ENCOUNTER — Telehealth: Payer: Self-pay | Admitting: Nurse Practitioner

## 2023-03-03 ENCOUNTER — Ambulatory Visit: Payer: Medicare Other | Attending: Nurse Practitioner

## 2023-03-03 ENCOUNTER — Ambulatory Visit: Payer: Medicare Other

## 2023-03-03 DIAGNOSIS — I480 Paroxysmal atrial fibrillation: Secondary | ICD-10-CM | POA: Diagnosis not present

## 2023-03-03 DIAGNOSIS — I4891 Unspecified atrial fibrillation: Secondary | ICD-10-CM | POA: Diagnosis not present

## 2023-03-03 NOTE — Telephone Encounter (Signed)
30 day [employer], placed and enrolled

## 2023-03-12 NOTE — Telephone Encounter (Signed)
After speaking with Sharlene Dory NP, patient instructed to continue with [employer] 30 day monitor for 2 weeks (if she can tolerate) then send back to the company. Patient states that Tuesday of next week will be 2 weeks of wearing the monitor.

## 2023-03-12 NOTE — Telephone Encounter (Signed)
Will she need to send the 30 day monitor back first before having another monitor placed?

## 2023-03-27 ENCOUNTER — Encounter: Payer: Self-pay | Admitting: *Deleted

## 2023-04-01 DIAGNOSIS — M810 Age-related osteoporosis without current pathological fracture: Secondary | ICD-10-CM | POA: Diagnosis not present

## 2023-04-06 DIAGNOSIS — M1990 Unspecified osteoarthritis, unspecified site: Secondary | ICD-10-CM | POA: Diagnosis not present

## 2023-04-14 ENCOUNTER — Ambulatory Visit: Payer: Medicare Other | Admitting: Gastroenterology

## 2023-04-21 ENCOUNTER — Other Ambulatory Visit: Payer: Self-pay | Admitting: Internal Medicine

## 2023-04-21 DIAGNOSIS — Z1231 Encounter for screening mammogram for malignant neoplasm of breast: Secondary | ICD-10-CM

## 2023-04-23 ENCOUNTER — Ambulatory Visit: Payer: Medicare Other | Admitting: Nurse Practitioner

## 2023-04-30 ENCOUNTER — Encounter: Payer: Self-pay | Admitting: Nurse Practitioner

## 2023-04-30 ENCOUNTER — Ambulatory Visit: Payer: Medicare Other | Attending: Nurse Practitioner | Admitting: Nurse Practitioner

## 2023-04-30 VITALS — BP 138/70 | HR 68 | Ht 63.0 in | Wt 176.0 lb

## 2023-04-30 DIAGNOSIS — E785 Hyperlipidemia, unspecified: Secondary | ICD-10-CM | POA: Diagnosis not present

## 2023-04-30 DIAGNOSIS — E039 Hypothyroidism, unspecified: Secondary | ICD-10-CM

## 2023-04-30 DIAGNOSIS — I10 Essential (primary) hypertension: Secondary | ICD-10-CM | POA: Diagnosis not present

## 2023-04-30 DIAGNOSIS — R002 Palpitations: Secondary | ICD-10-CM | POA: Diagnosis not present

## 2023-04-30 DIAGNOSIS — I480 Paroxysmal atrial fibrillation: Secondary | ICD-10-CM

## 2023-04-30 MED ORDER — METOPROLOL TARTRATE 25 MG PO TABS: 25.0000 mg | ORAL_TABLET | Freq: Three times a day (TID) | ORAL | 1 refills | Status: DC | PRN

## 2023-04-30 NOTE — Progress Notes (Signed)
Cardiology Office Note:    Date:  04/30/2023  ID:  [name], DOB 1946/06/12, MRN [id number]  PCP:  Ignatius Specking, MD   [facility] Providers Cardiologist:  Nona Dell, MD     Referring MD: Ignatius Specking, MD   CC: A-fib/ palpitations follow-up  History of Present Illness:    [name] is a delightful 77 y.o. female with a hx of the following:  PAF Hypertension Hypothyroidism Hyperlipidemia Depression, anxiety  Last seen by Dr. Diona Browner on July 30, 2022.  She was doing overall very well.    Saw her 12/2022 for evaluation for A-fib, episodes about once per month. During these episodes, chest became sore.   Today she presents for follow-up. Was not able to wear monitor the full amount of time d/t reaction of monitor. Has rare episodes of A-fib since I last saw her. Denies any recent episodes.  Denies any chest pain, shortness of breath, syncope, presyncope, dizziness, orthopnea, PND, swelling or significant weight changes, acute bleeding, or claudication.  SH: In her free time, she enjoys crossword puzzles, reading, and Bible studies.   Past Medical History:  Diagnosis Date   Anxiety    Arthritis    Atrial fibrillation [facility])    Documented December 2019   Depression    GERD (gastroesophageal reflux disease)    Heart murmur    Hyperlipidemia    Hypertension    Hypothyroidism     Past Surgical History:  Procedure Laterality Date   APPENDECTOMY     BUNIONECTOMY Bilateral    CATARACT EXTRACTION W/PHACO Right 10/09/2017   Procedure: CATARACT EXTRACTION PHACO AND INTRAOCULAR LENS PLACEMENT (IOC);  Surgeon: Fabio Pierce, MD;  Location: [facility];  Service: Ophthalmology;  Laterality: Right;  CDE: 6.65   CATARACT EXTRACTION W/PHACO Left 12/11/2017   Procedure: CATARACT EXTRACTION PHACO AND INTRAOCULAR LENS PLACEMENT (IOC);  Surgeon: Fabio Pierce, MD;  Location: [facility];  Service: Ophthalmology;  Laterality: Left;  CDE: 5.28   COLONOSCOPY   06/18/2012   Procedure: COLONOSCOPY;  Surgeon: Malissa Hippo, MD;  Location: [facility];  Service: Endoscopy;  Laterality: N/A;  730   COLONOSCOPY N/A 07/04/2019   Rehman: one 7 mm polyp in proximal ascending colon, removed, clips placed to treat postpolypectomy bleed (tubular adenoma), diverticulosis in sigmoid colon, hepatic flexure, external hemorrhoids   ESOPHAGOGASTRODUODENOSCOPY     Hammer toe repair Left    HEMORRHOID SURGERY     KNEE ARTHROSCOPY     left 06-2019   REVERSE SHOULDER ARTHROPLASTY Right 07/12/2021   Procedure: REVERSE SHOULDER ARTHROPLASTY;  Surgeon: Beverely Low, MD;  Location: [facility];  Service: Orthopedics;  Laterality: Right;  with ISB   SHOULDER ARTHROSCOPY     right   THYROIDECTOMY     TONSILLECTOMY     TOTAL ABDOMINAL HYSTERECTOMY     TOTAL KNEE ARTHROPLASTY Right 06/29/2020   Procedure: TOTAL KNEE ARTHROPLASTY;  Surgeon: Beverely Low, MD;  Location: [facility];  Service: Orthopedics;  Laterality: Right;  adductor canal   TOTAL SHOULDER ARTHROPLASTY Left 02/06/2017   TOTAL SHOULDER ARTHROPLASTY Left 02/06/2017   Procedure: TOTAL SHOULDER ARTHROPLASTY;  Surgeon: Beverely Low, MD;  Location: [facility] OR;  Service: Orthopedics;  Laterality: Left;   Current Meds  Medication Sig   acetaminophen (TYLENOL) 500 MG tablet Take 1,000 mg by mouth every 6 (six) hours as needed (pain.).    ALPRAZolam (XANAX) 0.25 MG tablet TAKE 1 OR 2 TABLETS BY MOUTH AT BEDTIME AS NEEDED FOR insomnia  apixaban (ELIQUIS) 5 MG TABS tablet Take 1 tablet (5 mg total) by mouth 2 (two) times daily.   Calcium Carbonate (CALCIUM 600 PO) Take 2 tablets by mouth daily.   citalopram (CELEXA) 20 MG tablet Take 20 mg by mouth daily.   diltiazem (CARDIZEM CD) 240 MG 24 hr capsule Take 1 capsule (240 mg total) by mouth daily.   Lactobacillus-Inulin (PROBIOTIC DIGESTIVE SUPPORT) CAPS Take 2 capsules by mouth daily.   levothyroxine (SYNTHROID, LEVOTHROID) 75 MCG tablet Take 75 mcg by mouth daily before  breakfast.    MAGNESIUM PO Take 266 mg by mouth daily.   Multiple Vitamins-Minerals (MULTIVITAMIN WITH MINERALS) tablet Take 1 tablet by mouth daily. One A Day for Women   simvastatin (ZOCOR) 10 MG tablet Take 10 mg by mouth daily.   spironolactone (ALDACTONE) 25 MG tablet TAKE 1 TABLET BY MOUTH  DAILY   Vitamin D3 (VITAMIN D) 25 MCG tablet Take 2,000 Units by mouth daily.   Wheat Dextrin (BENEFIBER) POWD Take 2 teaspoons daily mixed in beverage of choice.   metoprolol tartrate (LOPRESSOR) 25 MG tablet TAKE 1 TABLET BY MOUTH TWICE  DAILY AS NEEDED (PALPITATIONS)    Allergies:   Other, Latex, Penicillins, Sulfa antibiotics, and Tramadol   Social History   Socioeconomic History   Marital status: Widowed    Spouse name: Not on file   Number of children: Not on file   Years of education: Not on file   Highest education level: Not on file  Occupational History   Not on file  Tobacco Use   Smoking status: Never    Passive exposure: Never   Smokeless tobacco: Never  Vaping Use   Vaping Use: Never used  Substance and Sexual Activity   Alcohol use: No    Alcohol/week: 0.0 standard drinks of alcohol   Drug use: No   Sexual activity: Not Currently    Birth control/protection: Surgical  Other Topics Concern   Not on file  Social History Narrative   Not on file   Social Determinants of Health   Financial Resource Strain: Not on file  Food Insecurity: Not on file  Transportation Needs: Not on file  Physical Activity: Not on file  Stress: Not on file  Social Connections: Not on file     Family History: The patient's family history includes Atrial fibrillation in her sister. There is no history of Colon cancer, Breast cancer, Parkinson's disease, or Tremor.  ROS:     Please see the history of present illness.    All other systems reviewed and are negative.  EKGs/Labs/Other Studies Reviewed:    The following studies were reviewed today:   EKG:  EKG is not ordered today.    Cardiac monitor 02/2023: Predominant rhythm is sinus with heart rate ranging from 53 bpm up to 138 bpm and average heart rate 71 bpm. PR prolongation noted, no pauses or high degree heart block. There were rare PACs and PVCs representing less than 1% total beats. No sustained arrhythmias.  Echocardiogram on 11/14/2018: Study Conclusions   - Left ventricle: The cavity size was normal. Wall thickness was    increased in a pattern of moderate LVH. Systolic function was    normal. The estimated ejection fraction was in the range of 55%    to 60%. Wall motion was normal; there were no regional wall    motion abnormalities. Features are consistent with a pseudonormal    left ventricular filling pattern, with concomitant abnormal  relaxation and increased filling pressure (grade 2 diastolic    dysfunction).   Impressions:   - Normal LV systolic function; moderate diastolic dysfunction;    moderate LVH.   Recent Labs: 12/26/2022: ALT 16; BUN 10; Creatinine, Ser 0.96; Hemoglobin 16.0; Magnesium 2.2; Platelets 290; Potassium 4.9; Sodium 138; TSH 1.620  Recent Lipid Panel    Component Value Date/Time   CHOL 117 11/15/2018 0413   TRIG 102 11/15/2018 0413   HDL 39 (L) 11/15/2018 0413   CHOLHDL 3.0 11/15/2018 0413   VLDL 20 11/15/2018 0413   LDLCALC 58 11/15/2018 0413     Risk Assessment/Calculations:    CHA2DS2-VASc Score = 4  This indicates a 4.8% annual risk of stroke. The patient's score is based upon: CHF History: 0 HTN History: 1 Diabetes History: 0 Stroke History: 0 Vascular Disease History: 0 Age Score: 2 Gender Score: 1    STOP-Bang Score:  3  {   Physical Exam:    VS:  BP 138/70   Pulse 68   Ht 5\' 3"  (1.6 m)   Wt 176 lb (79.8 kg)   SpO2 95%   BMI 31.18 kg/m     Wt Readings from Last 3 Encounters:  04/30/23 176 lb (79.8 kg)  02/10/23 178 lb 9.6 oz (81 kg)  01/08/23 179 lb 3.2 oz (81.3 kg)     GEN: Obese, 77 y.o. female in no acute distress HEENT:  Normal NECK: No JVD; No carotid bruits CARDIAC: S1/S2, RRR, no murmurs, rubs, gallops; 2+ pulses RESPIRATORY:  Clear to auscultation without rales, wheezing or rhonchi  MUSCULOSKELETAL:  No edema; No deformity  SKIN: Warm and dry, thin skin NEUROLOGIC:  Alert and oriented x 3 PSYCHIATRIC:  Normal affect   ASSESSMENT:    1. Palpitations   2. Paroxysmal atrial fibrillation (HCC)   3. Essential hypertension, benign   4. Hyperlipidemia, unspecified hyperlipidemia type   5. Hypothyroidism, unspecified type     PLAN:    In order of problems listed above:  PAF, palpitations Rare episodes of A-fib. Monitor was reassuring. Denies any anginal symptoms. Continue current medication regimen. Discussed that she may take Lopressor 25 mg TID PRN for palpitations.  Discussed avoiding triggers to A-fib and previously recommended Kardia mobile app. Past labs unremarkable. Heart healthy diet and regular cardiovascular exercise encouraged.  Continue current medication regimen.  ED precautions discussed. At next visit, consider A-fib clinic referral/EP referral if needed.   HTN BP stable.  Continue current medication regimen. Discussed to monitor BP at home at least 2 hours after medications and sitting for 5-10 minutes. Heart healthy diet and regular cardiovascular exercise encouraged.   HLD LDL 86 in 2023.  Being managed by PCP.  Continue simvastatin. Heart healthy diet and regular cardiovascular exercise encouraged.   Hypothyroidism Past labs stable.  Continue levothyroxine. Continue to follow with PCP.     5. Disposition: Follow-up with Dr. Diona Browner or APP in 6 months or sooner if anything changes.  Medication Adjustments/Labs and Tests Ordered: Current medicines are reviewed at length with the patient today.  Concerns regarding medicines are outlined above.  No orders of the defined types were placed in this encounter.  Meds ordered this encounter  Medications   metoprolol tartrate  (LOPRESSOR) 25 MG tablet    Sig: Take 1 tablet (25 mg total) by mouth every 8 (eight) hours as needed (Palpitations).    Dispense:  180 tablet    Refill:  1    Please send a replace/new response with  100-Day Supply if appropriate to maximize member benefit. Requesting 1 year supply.    Patient Instructions  Medication Instructions:  Your physician has recommended you make the following change in your medication:  Take Metoprolol Tartrate 25 mg every 8 hours as needed for palpitations Continue all other medications the same  Labwork: None  Testing/Procedures: None  Follow-Up: Your physician recommends that you schedule a follow-up appointment in: 6 months  Any Other Special Instructions Will Be Listed Below (If Applicable).  If you need a refill on your cardiac medications before your next appointment, please call your pharmacy.    Signed, Sharlene Dory, NP  05/03/2023 6:49 PM    [facility]

## 2023-04-30 NOTE — Patient Instructions (Addendum)
Medication Instructions:  Your physician has recommended you make the following change in your medication:  Take Metoprolol Tartrate 25 mg every 8 hours as needed for palpitations Continue all other medications the same  Labwork: None  Testing/Procedures: None  Follow-Up: Your physician recommends that you schedule a follow-up appointment in: 6 months  Any Other Special Instructions Will Be Listed Below (If Applicable).  If you need a refill on your cardiac medications before your next appointment, please call your pharmacy.

## 2023-05-04 DIAGNOSIS — B351 Tinea unguium: Secondary | ICD-10-CM | POA: Diagnosis not present

## 2023-05-04 DIAGNOSIS — L110 Acquired keratosis follicularis: Secondary | ICD-10-CM | POA: Diagnosis not present

## 2023-05-04 DIAGNOSIS — M779 Enthesopathy, unspecified: Secondary | ICD-10-CM | POA: Diagnosis not present

## 2023-05-04 DIAGNOSIS — M79671 Pain in right foot: Secondary | ICD-10-CM | POA: Diagnosis not present

## 2023-05-04 DIAGNOSIS — M79674 Pain in right toe(s): Secondary | ICD-10-CM | POA: Diagnosis not present

## 2023-05-04 DIAGNOSIS — M79675 Pain in left toe(s): Secondary | ICD-10-CM | POA: Diagnosis not present

## 2023-05-04 DIAGNOSIS — M79672 Pain in left foot: Secondary | ICD-10-CM | POA: Diagnosis not present

## 2023-05-05 ENCOUNTER — Ambulatory Visit
Admission: RE | Admit: 2023-05-05 | Discharge: 2023-05-05 | Disposition: A | Discharge status: Home / Self Care | Payer: Medicare Other | Source: Ambulatory Visit | Attending: Internal Medicine | Admitting: Internal Medicine

## 2023-05-05 DIAGNOSIS — L299 Pruritus, unspecified: Secondary | ICD-10-CM | POA: Diagnosis not present

## 2023-05-05 DIAGNOSIS — Z1231 Encounter for screening mammogram for malignant neoplasm of breast: Secondary | ICD-10-CM

## 2023-05-05 DIAGNOSIS — D6869 Other thrombophilia: Secondary | ICD-10-CM | POA: Diagnosis not present

## 2023-05-05 DIAGNOSIS — I10 Essential (primary) hypertension: Secondary | ICD-10-CM | POA: Diagnosis not present

## 2023-05-05 DIAGNOSIS — Z299 Encounter for prophylactic measures, unspecified: Secondary | ICD-10-CM | POA: Diagnosis not present

## 2023-05-05 DIAGNOSIS — I272 Pulmonary hypertension, unspecified: Secondary | ICD-10-CM | POA: Diagnosis not present

## 2023-06-09 DIAGNOSIS — R5383 Other fatigue: Secondary | ICD-10-CM | POA: Diagnosis not present

## 2023-06-09 DIAGNOSIS — E261 Secondary hyperaldosteronism: Secondary | ICD-10-CM | POA: Diagnosis not present

## 2023-06-09 DIAGNOSIS — Z7189 Other specified counseling: Secondary | ICD-10-CM | POA: Diagnosis not present

## 2023-06-09 DIAGNOSIS — D582 Other hemoglobinopathies: Secondary | ICD-10-CM | POA: Diagnosis not present

## 2023-06-09 DIAGNOSIS — Z0000 Encounter for general adult medical examination without abnormal findings: Secondary | ICD-10-CM | POA: Diagnosis not present

## 2023-06-09 DIAGNOSIS — I10 Essential (primary) hypertension: Secondary | ICD-10-CM | POA: Diagnosis not present

## 2023-06-09 DIAGNOSIS — I4891 Unspecified atrial fibrillation: Secondary | ICD-10-CM | POA: Diagnosis not present

## 2023-06-09 DIAGNOSIS — Z79899 Other long term (current) drug therapy: Secondary | ICD-10-CM | POA: Diagnosis not present

## 2023-06-09 DIAGNOSIS — Z299 Encounter for prophylactic measures, unspecified: Secondary | ICD-10-CM | POA: Diagnosis not present

## 2023-06-09 DIAGNOSIS — E7800 Pure hypercholesterolemia, unspecified: Secondary | ICD-10-CM | POA: Diagnosis not present

## 2023-06-09 DIAGNOSIS — E559 Vitamin D deficiency, unspecified: Secondary | ICD-10-CM | POA: Diagnosis not present

## 2023-07-06 DIAGNOSIS — I10 Essential (primary) hypertension: Secondary | ICD-10-CM | POA: Diagnosis not present

## 2023-07-06 DIAGNOSIS — Z299 Encounter for prophylactic measures, unspecified: Secondary | ICD-10-CM | POA: Diagnosis not present

## 2023-07-06 DIAGNOSIS — D485 Neoplasm of uncertain behavior of skin: Secondary | ICD-10-CM | POA: Diagnosis not present

## 2023-07-06 DIAGNOSIS — L820 Inflamed seborrheic keratosis: Secondary | ICD-10-CM | POA: Diagnosis not present

## 2023-07-08 ENCOUNTER — Other Ambulatory Visit: Payer: Self-pay | Admitting: Nurse Practitioner

## 2023-07-08 DIAGNOSIS — I480 Paroxysmal atrial fibrillation: Secondary | ICD-10-CM

## 2023-07-30 DIAGNOSIS — M1712 Unilateral primary osteoarthritis, left knee: Secondary | ICD-10-CM | POA: Diagnosis not present

## 2023-07-31 ENCOUNTER — Telehealth: Payer: Self-pay

## 2023-07-31 NOTE — Telephone Encounter (Signed)
Pharmacy please advise on holding Eliquis prior to Total Knee Replacement scheduled for TBD. The pre-op request does not mention request for recommendations on Eliquis but on review of medication list, this was noted. . Thank you.

## 2023-07-31 NOTE — Telephone Encounter (Signed)
Patient with diagnosis of atrial fibrillation on Eliquis for anticoagulation.    Procedure:   Left TKA   Date of Surgery:  Clearance TBD        CHA2DS2-VASc Score = 4   This indicates a 4.8% annual risk of stroke. The patient's score is based upon: CHF History: 0 HTN History: 1 Diabetes History: 0 Stroke History: 0 Vascular Disease History: 0 Age Score: 2 Gender Score: 1    CrCl 62 Platelet count 290  Per office protocol, patient can hold Eliquis for 3 days prior to procedure.   Patient will not need bridging with Lovenox (enoxaparin) around procedure.  **This guidance is not considered finalized until pre-operative APP has relayed final recommendations.**

## 2023-07-31 NOTE — Telephone Encounter (Signed)
    Pre-operative Risk Assessment    Patient Name: [name]  DOB: 02-03-1946 MRN: [id number]      Request for Surgical Clearance    Procedure:   Left TKA  Date of Surgery:  Clearance TBD                                 Surgeon:  Dr. Malon Kindle   Surgeon's Group or Practice Name:  [facility]  Phone number:  [phone] Fax number:  [phone]   Type of Clearance Requested:   - Medical    Type of Anesthesia:  Spinal   Additional requests/questions:    Lajuana Matte   07/31/2023, 3:52 PM

## 2023-08-03 ENCOUNTER — Telehealth: Payer: Self-pay

## 2023-08-03 DIAGNOSIS — S93332D Other subluxation of left foot, subsequent encounter: Secondary | ICD-10-CM | POA: Diagnosis not present

## 2023-08-03 DIAGNOSIS — M79675 Pain in left toe(s): Secondary | ICD-10-CM | POA: Diagnosis not present

## 2023-08-03 DIAGNOSIS — I272 Pulmonary hypertension, unspecified: Secondary | ICD-10-CM | POA: Diagnosis not present

## 2023-08-03 DIAGNOSIS — I10 Essential (primary) hypertension: Secondary | ICD-10-CM | POA: Diagnosis not present

## 2023-08-03 DIAGNOSIS — M779 Enthesopathy, unspecified: Secondary | ICD-10-CM | POA: Diagnosis not present

## 2023-08-03 DIAGNOSIS — S93331D Other subluxation of right foot, subsequent encounter: Secondary | ICD-10-CM | POA: Diagnosis not present

## 2023-08-03 DIAGNOSIS — I4891 Unspecified atrial fibrillation: Secondary | ICD-10-CM | POA: Diagnosis not present

## 2023-08-03 DIAGNOSIS — Z299 Encounter for prophylactic measures, unspecified: Secondary | ICD-10-CM | POA: Diagnosis not present

## 2023-08-03 DIAGNOSIS — M79671 Pain in right foot: Secondary | ICD-10-CM | POA: Diagnosis not present

## 2023-08-03 DIAGNOSIS — M79674 Pain in right toe(s): Secondary | ICD-10-CM | POA: Diagnosis not present

## 2023-08-03 DIAGNOSIS — R5383 Other fatigue: Secondary | ICD-10-CM | POA: Diagnosis not present

## 2023-08-03 DIAGNOSIS — M79672 Pain in left foot: Secondary | ICD-10-CM | POA: Diagnosis not present

## 2023-08-03 NOTE — Telephone Encounter (Signed)
   Name: [name]  DOB: 1946/09/06  MRN: [id number]  Primary Cardiologist: Nona Dell, MD   Preoperative team, please contact this patient and set up a phone call appointment for further preoperative risk assessment. Please obtain consent and complete medication review. Thank you for your help.  I confirm that guidance regarding antiplatelet and oral anticoagulation therapy has been completed and, if necessary, noted below.   Per office protocol, patient can hold Eliquis for 3 days prior to procedure.   Patient will not need bridging with Lovenox (enoxaparin) around procedure.   Joni Reining, NP 08/03/2023, 7:44 AM [facility]

## 2023-08-03 NOTE — Telephone Encounter (Signed)
I made contact with pt to scheduled. While going over medication list pt stated that she has had about 4 episodes of Afib needing her to take the PRN metoprolol.   I secure chatted KL to ask if pt needs to be an in-office visit or could remain a tele.

## 2023-08-03 NOTE — Telephone Encounter (Signed)
Pt will need in-office appt due to AFIB episodes. A staff message has been sent to [facility] scheduling.

## 2023-08-05 ENCOUNTER — Ambulatory Visit: Payer: Medicare Other | Attending: Cardiology | Admitting: Nurse Practitioner

## 2023-08-05 ENCOUNTER — Encounter: Payer: Self-pay | Admitting: Nurse Practitioner

## 2023-08-05 VITALS — BP 114/82 | HR 60 | Ht 62.0 in | Wt 177.2 lb

## 2023-08-05 DIAGNOSIS — I480 Paroxysmal atrial fibrillation: Secondary | ICD-10-CM

## 2023-08-05 DIAGNOSIS — R002 Palpitations: Secondary | ICD-10-CM

## 2023-08-05 DIAGNOSIS — I10 Essential (primary) hypertension: Secondary | ICD-10-CM

## 2023-08-05 DIAGNOSIS — E039 Hypothyroidism, unspecified: Secondary | ICD-10-CM

## 2023-08-05 DIAGNOSIS — E785 Hyperlipidemia, unspecified: Secondary | ICD-10-CM

## 2023-08-05 DIAGNOSIS — R0789 Other chest pain: Secondary | ICD-10-CM

## 2023-08-05 DIAGNOSIS — Z01810 Encounter for preprocedural cardiovascular examination: Secondary | ICD-10-CM | POA: Diagnosis not present

## 2023-08-05 NOTE — Patient Instructions (Addendum)
Medication Instructions:  Your physician recommends that you continue on your current medications as directed. Please refer to the Current Medication list given to you today.  Labwork: None  Testing/Procedures: None  Follow-Up: Your physician recommends that you schedule a follow-up appointment in: 2 Month Tele visit   Any Other Special Instructions Will Be Listed Below (If Applicable).  If you need a refill on your cardiac medications before your next appointment, please call your pharmacy.

## 2023-08-05 NOTE — Telephone Encounter (Signed)
Advised that eliquis samples are not available at this time but we could initiate patient assistance through [employer]. Says she doesn't want to do patient assistance and will pay for her eliquis for the remainder of the year.

## 2023-08-05 NOTE — Progress Notes (Signed)
Cardiology Office Note:    Date:  08/05/2023 ID:  [name], DOB December 21, 1945, MRN [id number] PCP:  Deborah Specking, MD [facility] Providers Cardiologist:  Deborah Dell, MD    Referring MD: Deborah Specking, MD   CC: A-fib/ palpitations follow-up  History of Present Illness:    [name] is a delightful 77 y.o. female with a PMH of PAF, HTN, hypothyroidism, HLD, anxiety, and depression, who presents today for A-fib follow-up.   Last seen by Dr. Diona Roberts on July 30, 2022.  She was doing overall very well.    I saw her for follow-up on April 30, 2023. She was not able to wear monitor the full amount of time d/t reaction of monitor. Noted rare episodes of A-fib since. Denied any recent episodes of A-fib and denied any chest pain, shortness of breath, syncope, presyncope, dizziness, orthopnea, PND, swelling or significant weight changes, acute bleeding, or claudication. Parameters given for taking Lopressor.  Today she presents for follow-up. Has noticed about 4-6 episodes of A-fib since last visit. She says Lopressor is helping and within 1-1.5 hours after taking medication, A-fib "calms down." Denies any specific triggers to her palpitations, begins randomly. During episodes of A-fib, also admits to mid chest tightness, and left shoulder blade becomes sore, next day typically has no energy. Denies any shortness of breath, syncope, presyncope, dizziness, orthopnea, PND, swelling or significant weight changes, acute bleeding, or claudication. Pending left TKA - our office received clearance on 07/31/2023.   SH: In her free time, she enjoys crossword puzzles, reading, and Bible studies.   ROS:    Please see the history of present illness.    All other systems reviewed and are negative.  EKGs/Labs/Other Studies Reviewed:    The following studies were reviewed today:   EKG:  EKG is not ordered today.   Cardiac monitor 02/2023: Predominant rhythm is sinus with heart  rate ranging from 53 bpm up to 138 bpm and average heart rate 71 bpm. PR prolongation noted, no pauses or high degree heart block. There were rare PACs and PVCs representing less than 1% total beats. No sustained arrhythmias.  Echocardiogram on 11/14/2018: Study Conclusions   - Left ventricle: The cavity size was normal. Wall thickness was    increased in a pattern of moderate LVH. Systolic function was    normal. The estimated ejection fraction was in the range of 55%    to 60%. Wall motion was normal; there were no regional wall    motion abnormalities. Features are consistent with a pseudonormal    left ventricular filling pattern, with concomitant abnormal    relaxation and increased filling pressure (grade 2 diastolic    dysfunction).   Impressions:   - Normal LV systolic function; moderate diastolic dysfunction;    moderate LVH.  Risk Assessment/Calculations:    CHA2DS2-VASc Score = 4  This indicates a 4.8% annual risk of stroke. The patient's score is based upon: CHF History: 0 HTN History: 1 Diabetes History: 0 Stroke History: 0 Vascular Disease History: 0 Age Score: 2 Gender Score: 1    STOP-Bang Score:  3  {   Physical Exam:    VS:  BP 114/82   Pulse 60   Ht 5\' 2"  (1.575 m)   Wt 177 lb 3.2 oz (80.4 kg)   SpO2 96%   BMI 32.41 kg/m     Wt Readings from Last 3 Encounters:  08/05/23 177 lb 3.2 oz (80.4 kg)  04/30/23  176 lb (79.8 kg)  02/10/23 178 lb 9.6 oz (81 kg)    GEN: Obese, 77 y.o. female in no acute distress HEENT: Normal NECK: No JVD; No carotid bruits CARDIAC: S1/S2, RRR, no murmurs, rubs, gallops; 2+ pulses RESPIRATORY:  Clear to auscultation without rales, wheezing or rhonchi  MUSCULOSKELETAL:  No edema; No deformity  SKIN: Warm and dry, thin skin NEUROLOGIC:  Alert and oriented x 3 PSYCHIATRIC:  Normal affect   ASSESSMENT & PLAN:    In order of problems listed above:  PAF, palpitations, atypical chest pain Admits to 4-6 episodes of  A-fib since last OV. Past monitor was reassuring - Lopressor does help calm down A-fib. CP r/t episodes of A-fib. No indication for ischemic evaluation at this time. Discussed avoiding triggers to A-fib and previously recommended Kardia mobile app. Discussed A-fib clinic/ EP/referral and she declines at this time. Past labs unremarkable. Heart healthy diet and regular cardiovascular exercise encouraged.  Continue current medication regimen.  ED precautions discussed. Care and ED precautions discussed.  HTN BP stable.  Continue current medication regimen. Discussed to monitor BP at home at least 2 hours after medications and sitting for 5-10 minutes. Heart healthy diet and regular cardiovascular exercise encouraged.   HLD LDL 86 in 2023.  Being managed by PCP.  Continue simvastatin. Heart healthy diet and regular cardiovascular exercise encouraged.   Hypothyroidism Past labs stable.  Continue levothyroxine. Continue to follow with PCP.  5. Pre-operative cardiovascular risk assessment  Ms. [name]'s perioperative risk of a major cardiac event is 0.4% according to the Revised Cardiac Risk Index (RCRI).  Therefore, she is at low risk for perioperative complications.   Her functional capacity is good at 5.07 METs according to the Duke Activity Status Index (DASI). Recommendations: According to ACC/AHA guidelines, no further cardiovascular testing needed.  The patient may proceed to surgery at acceptable risk.   Antiplatelet and/or Anticoagulation Recommendations: Per office office protocol, patient can hold Eliquis for 3 days prior to procedure. Patient will not need bridging with Lovenox around procedure. Will route note to requesting party.   5. Disposition: Follow-up with Dr. Diona Roberts or APP in 2 months via telephone visit or sooner if anything changes.  Medication Adjustments/Labs and Tests Ordered: Current medicines are reviewed at length with the patient today.  Concerns regarding medicines  are outlined above.  No orders of the defined types were placed in this encounter.  No orders of the defined types were placed in this encounter.   Patient Instructions  Medication Instructions:  Your physician recommends that you continue on your current medications as directed. Please refer to the Current Medication list given to you today.  Labwork: None  Testing/Procedures: None  Follow-Up: Your physician recommends that you schedule a follow-up appointment in: 2 Month Tele visit   Any Other Special Instructions Will Be Listed Below (If Applicable).  If you need a refill on your cardiac medications before your next appointment, please call your pharmacy.   Signed, Sharlene Dory, NP

## 2023-08-11 ENCOUNTER — Telehealth: Payer: Medicare Other

## 2023-08-18 DIAGNOSIS — H1045 Other chronic allergic conjunctivitis: Secondary | ICD-10-CM | POA: Diagnosis not present

## 2023-09-01 NOTE — H&P (Signed)
Patient's anticipated LOS is less than 2 midnights, meeting these requirements: - Younger than 60 - Lives within 1 hour of care - Has a competent adult at home to recover with post-op recover - NO history of  - Chronic pain requiring opiods  - Diabetes  - Coronary Artery Disease  - Heart failure  - Heart attack  - Stroke  - DVT/VTE  - Cardiac arrhythmia  - Respiratory Failure/COPD  - Renal failure  - Anemia  - Advanced Liver disease     [name] is an 77 y.o. female.    Chief Complaint: left knee pain  HPI: Pt is a 77 y.o. female complaining of left knee pain for multiple years. Pain had continually increased since the beginning. X-rays in the clinic show end-stage arthritic changes of the left knee. Pt has tried various conservative treatments which have failed to alleviate their symptoms, including injections and therapy. Various options are discussed with the patient. Risks, benefits and expectations were discussed with the patient. Patient understand the risks, benefits and expectations and wishes to proceed with surgery.   PCP:  Ignatius Specking, MD  D/C Plans: Home  PMH: Past Medical History:  Diagnosis Date   Anxiety    Arthritis    Atrial fibrillation [facility])    Documented December 2019   Depression    GERD (gastroesophageal reflux disease)    Heart murmur    Hyperlipidemia    Hypertension    Hypothyroidism     PSH: Past Surgical History:  Procedure Laterality Date   APPENDECTOMY     BUNIONECTOMY Bilateral    CATARACT EXTRACTION W/PHACO Right 10/09/2017   Procedure: CATARACT EXTRACTION PHACO AND INTRAOCULAR LENS PLACEMENT (IOC);  Surgeon: Fabio Pierce, MD;  [facility];  Service: Ophthalmology;  Laterality: Right;  CDE: 6.65   CATARACT EXTRACTION W/PHACO Left 12/11/2017   Procedure: CATARACT EXTRACTION PHACO AND INTRAOCULAR LENS PLACEMENT (IOC);  Surgeon: Fabio Pierce, MD;  Location: [facility];  Service: Ophthalmology;  Laterality: Left;  CDE:  5.28   COLONOSCOPY  06/18/2012   Procedure: COLONOSCOPY;  Surgeon: Malissa Hippo, MD;  Location[facility];  Service: Endoscopy;  Laterality: N/A;  730   COLONOSCOPY N/A 07/04/2019   Rehman: one 7 mm polyp in proximal ascending colon, removed, clips placed to treat postpolypectomy bleed (tubular adenoma), diverticulosis in sigmoid colon, hepatic flexure, external hemorrhoids   ESOPHAGOGASTRODUODENOSCOPY     Hammer toe repair Left    HEMORRHOID SURGERY     KNEE ARTHROSCOPY     left 06-2019   REVERSE SHOULDER ARTHROPLASTY Right 07/12/2021   Procedure: REVERSE SHOULDER ARTHROPLASTY;  Surgeon: Beverely Low, MD;  [facility];  Service: Orthopedics;  Laterality: Right;  with ISB   SHOULDER ARTHROSCOPY     right   THYROIDECTOMY     TONSILLECTOMY     TOTAL ABDOMINAL HYSTERECTOMY     TOTAL KNEE ARTHROPLASTY Right 06/29/2020   Procedure: TOTAL KNEE ARTHROPLASTY;  Surgeon: Beverely Low, MD;  [facility];  Service: Orthopedics;  Laterality: Right;  adductor canal   TOTAL SHOULDER ARTHROPLASTY Left 02/06/2017   TOTAL SHOULDER ARTHROPLASTY Left 02/06/2017   Procedure: TOTAL SHOULDER ARTHROPLASTY;  Surgeon: Beverely Low, MD;  Location: [facility] OR;  Service: Orthopedics;  Laterality: Left;    Social History:  reports that she has never smoked. She has never been exposed to tobacco smoke. She has never used smokeless tobacco. She reports that she does not drink alcohol and does not use drugs. BMI:  Estimated body mass index is 32.41 kg/m as calculated from the following:   Height as of 08/05/23: 5\' 2"  (1.575 m).   Weight as of 08/05/23: 80.4 kg.  Lab Results  Component Value Date   ALBUMIN 4.6 12/26/2022   Diabetes: Patient does not have a diagnosis of diabetes.     Smoking Status:   reports that she has never smoked. She has never been exposed to tobacco smoke. She has never used smokeless tobacco.    Allergies:  Allergies  Allergen Reactions   Other Nausea And Vomiting     Opiate based pain medicines    Latex Rash   Penicillins Rash and Other (See Comments)    Has patient had a PCN reaction causing immediate rash, facial/tongue/throat swelling, SOB or lightheadedness with hypotension: #  #  #  YES  #  #  #  Has patient had a PCN reaction causing severe rash involving mucus membranes or skin necrosis: No Has patient had a PCN reaction that required hospitalization Yes Has patient had a PCN reaction occurring within the last 10 years: No If all of the above answers are "NO", then may proceed with Cephalosporin use.    Sulfa Antibiotics Rash   Tramadol Itching    Medications: No current facility-administered medications for this encounter.   Current Outpatient Medications  Medication Sig Dispense Refill   acetaminophen (TYLENOL) 500 MG tablet Take 1,000 mg by mouth every 6 (six) hours as needed (pain.).      ALPRAZolam (XANAX) 0.25 MG tablet TAKE 1 OR 2 TABLETS BY MOUTH AT BEDTIME AS NEEDED FOR insomnia     apixaban (ELIQUIS) 5 MG TABS tablet Take 1 tablet (5 mg total) by mouth 2 (two) times daily. 28 tablet 0   Calcium Carbonate (CALCIUM 600 PO) Take 2 tablets by mouth daily.     citalopram (CELEXA) 20 MG tablet Take 20 mg by mouth daily.     diltiazem (CARDIZEM CD) 240 MG 24 hr capsule Take 1 capsule (240 mg total) by mouth daily. 90 capsule 1   Lactobacillus-Inulin (PROBIOTIC DIGESTIVE SUPPORT) CAPS Take 2 capsules by mouth daily.     levothyroxine (SYNTHROID, LEVOTHROID) 75 MCG tablet Take 75 mcg by mouth daily before breakfast.      MAGNESIUM PO Take 266 mg by mouth daily.     metoprolol tartrate (LOPRESSOR) 25 MG tablet TAKE 1 TABLET BY MOUTH EVERY 8  HOURS AS NEEDED FOR PALPITATIONS 180 tablet 1   Multiple Vitamins-Minerals (MULTIVITAMIN WITH MINERALS) tablet Take 1 tablet by mouth daily. One A Day for Women     simvastatin (ZOCOR) 10 MG tablet Take 10 mg by mouth daily.     spironolactone (ALDACTONE) 25 MG tablet TAKE 1 TABLET BY MOUTH  DAILY 90  tablet 1   Vitamin D3 (VITAMIN D) 25 MCG tablet Take 2,000 Units by mouth daily.     Wheat Dextrin (BENEFIBER) POWD Take 2 teaspoons daily mixed in beverage of choice. 350 g 3    No results found for this or any previous visit (from the past 48 hour(s)). No results found.  ROS: Pain with rom of the left lower extremity  Physical Exam: Alert and oriented 77 y.o. female in no acute distress Cranial nerves 2-12 intact Cervical spine: full rom with no tenderness, nv intact distally Chest: active breath sounds bilaterally, no wheeze rhonchi or rales Heart: regular rate and rhythm, no murmur Abd: non tender non distended with active bowel sounds Hip is stable with rom  Left knee pain with rom and ambulation Nv intact distally No rashes or edema distally  Assessment/Plan Assessment: left knee end stage osteoarthritis  Plan:  Patient will undergo a left total knee by Dr. Ranell Patrick at [place] Risks benefits and expectations were discussed with the patient. Patient understand risks, benefits and expectations and wishes to proceed. Preoperative templating of the joint replacement has been completed, documented, and submitted to the Operating Room personnel in order to optimize intra-operative equipment management.   Alphonsa Overall PA-C, MPAS [facility] is now [employer] [address], [place], [place] [address] Phone: [phone] [email or web address] Facebook  [employer]

## 2023-09-11 DIAGNOSIS — I10 Essential (primary) hypertension: Secondary | ICD-10-CM | POA: Diagnosis not present

## 2023-09-11 DIAGNOSIS — Z299 Encounter for prophylactic measures, unspecified: Secondary | ICD-10-CM | POA: Diagnosis not present

## 2023-09-11 DIAGNOSIS — I4891 Unspecified atrial fibrillation: Secondary | ICD-10-CM | POA: Diagnosis not present

## 2023-09-14 NOTE — Progress Notes (Signed)
Anesthesia Review:  PCP:DR Vyas clearance 08/03/23 on chart LOV 08/02/23- on chart  Cardiologist : Gus Rankin LOV 08/05/23 on chart  Chest x-ray : EKG : 12/25/22  Echo : 2019  Stress test: Cardiac Cath :  Activity level:  Sleep Study/ CPAP : Fasting Blood Sugar :      / Checks Blood Sugar -- times a day:   Blood Thinner/ Instructions /Last Dose: ASA / Instructions/ Last Dose :    Eliquis

## 2023-09-14 NOTE — Patient Instructions (Signed)
SURGICAL WAITING ROOM VISITATION  Patients having surgery or a procedure may have no more than 2 support people in the waiting area - these visitors may rotate.    Children under the age of 12 must have an adult with them who is not the patient.  Due to an increase in RSV and influenza rates and associated hospitalizations, children ages 38 and under may not visit patients in [facility] hospitals.  If the patient needs to stay at the hospital during part of their recovery, the visitor guidelines for inpatient rooms apply. Pre-op nurse will coordinate an appropriate time for 1 support person to accompany patient in pre-op.  This support person may not rotate.    Please refer to the [facility] website for the visitor guidelines for Inpatients (after your surgery is over and you are in a regular room).       Your procedure is scheduled on:  09/25/2023    Report to [facility] Main Entrance    Report to admitting at   0800AM   Call this number if you have problems the morning of surgery [phone]   Do not eat food :After Midnight.   After Midnight you may have the following liquids until __ 0730____ AM DAY OF SURGERY  Water Non-Citrus Juices (without pulp, NO RED-Apple, White grape, White cranberry) Black Coffee (NO MILK/CREAM OR CREAMERS, sugar ok)  Clear Tea (NO MILK/CREAM OR CREAMERS, sugar ok) regular and decaf                             Plain Jell-O (NO RED)                                           Fruit ices (not with fruit pulp, NO RED)                                     Popsicles (NO RED)                                                               Sports drinks like Gatorade (NO RED)                    The day of surgery:  Drink ONE (1) Pre-Surgery Clear Ensure or G2 at    0730am ( have completed by )  the morning of surgery. Drink in one sitting. Do not sip.  This drink was given to you during your hospital  pre-op appointment visit. Nothing else to  drink after completing the  Pre-Surgery Clear Ensure or G2.          If you have questions, please contact your surgeon's office.      Oral Hygiene is also important to reduce your risk of infection.                                    Remember - BRUSH YOUR TEETH THE MORNING OF SURGERY WITH  YOUR REGULAR TOOTHPASTE  DENTURES WILL BE REMOVED PRIOR TO SURGERY PLEASE DO NOT APPLY "Poly grip" OR ADHESIVES!!!   Do NOT smoke after Midnight   Stop all vitamins and herbal supplements 7 days before surgery.   Take these medicines the morning of surgery with A SIP OF WATER:  celexa, cardizem, synthroid, metoprolol   DO NOT TAKE ANY ORAL DIABETIC MEDICATIONS DAY OF YOUR SURGERY  Bring CPAP mask and tubing day of surgery.                              You may not have any metal on your body including hair pins, jewelry, and body piercing             Do not wear make-up, lotions, powders, perfumes/cologne, or deodorant  Do not wear nail polish including gel and S&S, artificial/acrylic nails, or any other type of covering on natural nails including finger and toenails. If you have artificial nails, gel coating, etc. that needs to be removed by a nail salon please have this removed prior to surgery or surgery may need to be canceled/ delayed if the surgeon/ anesthesia feels like they are unable to be safely monitored.   Do not shave  48 hours prior to surgery.               Men may shave face and neck.   Do not bring valuables to the hospital. [facility] IS NOT             RESPONSIBLE   FOR VALUABLES.   Contacts, glasses, dentures or bridgework may not be worn into surgery.   Bring small overnight bag day of surgery.   DO NOT BRING YOUR HOME MEDICATIONS TO THE HOSPITAL. PHARMACY WILL DISPENSE MEDICATIONS LISTED ON YOUR MEDICATION LIST TO YOU DURING YOUR ADMISSION IN THE HOSPITAL!    Patients discharged on the day of surgery will not be allowed to drive home.  Someone NEEDS to stay with you  for the first 24 hours after anesthesia.   Special Instructions: Bring a copy of your healthcare power of attorney and living will documents the day of surgery if you haven't scanned them before.              Please read over the following fact sheets you were given: IF YOU HAVE QUESTIONS ABOUT YOUR PRE-OP INSTRUCTIONS PLEASE CALL [phone]   If you received a COVID test during your pre-op visit  it is requested that you wear a mask when out in public, stay away from anyone that may not be feeling well and notify your surgeon if you develop symptoms. If you test positive for Covid or have been in contact with anyone that has tested positive in the last 10 days please notify you surgeon.      Pre-operative 5 CHG Bath Instructions   You can play a key role in reducing the risk of infection after surgery. Your skin needs to be as free of germs as possible. You can reduce the number of germs on your skin by washing with CHG (chlorhexidine gluconate) soap before surgery. CHG is an antiseptic soap that kills germs and continues to kill germs even after washing.   DO NOT use if you have an allergy to chlorhexidine/CHG or antibacterial soaps. If your skin becomes reddened or irritated, stop using the CHG and notify one of our RNs at [phone].   Please shower with the  CHG soap starting 4 days before surgery using the following schedule:     Please keep in mind the following:  DO NOT shave, including legs and underarms, starting the day of your first shower.   You may shave your face at any point before/day of surgery.  Place clean sheets on your bed the day you start using CHG soap. Use a clean washcloth (not used since being washed) for each shower. DO NOT sleep with pets once you start using the CHG.   CHG Shower Instructions:  If you choose to wash your hair and private area, wash first with your normal shampoo/soap.  After you use shampoo/soap, rinse your hair and body thoroughly to  remove shampoo/soap residue.  Turn the water OFF and apply about 3 tablespoons (45 ml) of CHG soap to a CLEAN washcloth.  Apply CHG soap ONLY FROM YOUR NECK DOWN TO YOUR TOES (washing for 3-5 minutes)  DO NOT use CHG soap on face, private areas, open wounds, or sores.  Pay special attention to the area where your surgery is being performed.  If you are having back surgery, having someone wash your back for you may be helpful. Wait 2 minutes after CHG soap is applied, then you may rinse off the CHG soap.  Pat dry with a clean towel  Put on clean clothes/pajamas   If you choose to wear lotion, please use ONLY the CHG-compatible lotions on the back of this paper.     Additional instructions for the day of surgery: DO NOT APPLY any lotions, deodorants, cologne, or perfumes.   Put on clean/comfortable clothes.  Brush your teeth.  Ask your nurse before applying any prescription medications to the skin.      CHG Compatible Lotions   Aveeno Moisturizing lotion  Cetaphil Moisturizing Cream  Cetaphil Moisturizing Lotion  Clairol Herbal Essence Moisturizing Lotion, Dry Skin  Clairol Herbal Essence Moisturizing Lotion, Extra Dry Skin  Clairol Herbal Essence Moisturizing Lotion, Normal Skin  Curel Age Defying Therapeutic Moisturizing Lotion with Alpha Hydroxy  Curel Extreme Care Body Lotion  Curel Soothing Hands Moisturizing Hand Lotion  Curel Therapeutic Moisturizing Cream, Fragrance-Free  Curel Therapeutic Moisturizing Lotion, Fragrance-Free  Curel Therapeutic Moisturizing Lotion, Original Formula  Eucerin Daily Replenishing Lotion  Eucerin Dry Skin Therapy Plus Alpha Hydroxy Crme  Eucerin Dry Skin Therapy Plus Alpha Hydroxy Lotion  Eucerin Original Crme  Eucerin Original Lotion  Eucerin Plus Crme Eucerin Plus Lotion  Eucerin TriLipid Replenishing Lotion  Keri Anti-Bacterial Hand Lotion  Keri Deep Conditioning Original Lotion Dry Skin Formula Softly Scented  Keri Deep Conditioning  Original Lotion, Fragrance Free Sensitive Skin Formula  Keri Lotion Fast Absorbing Fragrance Free Sensitive Skin Formula  Keri Lotion Fast Absorbing Softly Scented Dry Skin Formula  Keri Original Lotion  Keri Skin Renewal Lotion Keri Silky Smooth Lotion  Keri Silky Smooth Sensitive Skin Lotion  Nivea Body Creamy Conditioning Oil  Nivea Body Extra Enriched Teacher, adult education Moisturizing Lotion Nivea Crme  Nivea Skin Firming Lotion  NutraDerm 30 Skin Lotion  NutraDerm Skin Lotion  NutraDerm Therapeutic Skin Cream  NutraDerm Therapeutic Skin Lotion  ProShield Protective Hand Cream  Provon moisturizing lotion

## 2023-09-16 ENCOUNTER — Encounter (HOSPITAL_COMMUNITY): Payer: Self-pay

## 2023-09-16 ENCOUNTER — Encounter (HOSPITAL_COMMUNITY)
Admission: RE | Admit: 2023-09-16 | Discharge: 2023-09-16 | Disposition: A | Discharge status: Home / Self Care | Payer: Medicare Other | Source: Ambulatory Visit | Attending: Orthopedic Surgery | Admitting: Orthopedic Surgery

## 2023-09-16 ENCOUNTER — Other Ambulatory Visit: Payer: Self-pay

## 2023-09-16 VITALS — BP 157/65 | HR 77 | Temp 98.0°F | Resp 16 | Ht 61.75 in | Wt 176.0 lb

## 2023-09-16 DIAGNOSIS — Z01818 Encounter for other preprocedural examination: Secondary | ICD-10-CM | POA: Diagnosis present

## 2023-09-16 DIAGNOSIS — E039 Hypothyroidism, unspecified: Secondary | ICD-10-CM | POA: Diagnosis not present

## 2023-09-16 DIAGNOSIS — K219 Gastro-esophageal reflux disease without esophagitis: Secondary | ICD-10-CM | POA: Insufficient documentation

## 2023-09-16 DIAGNOSIS — E785 Hyperlipidemia, unspecified: Secondary | ICD-10-CM | POA: Insufficient documentation

## 2023-09-16 DIAGNOSIS — I10 Essential (primary) hypertension: Secondary | ICD-10-CM | POA: Insufficient documentation

## 2023-09-16 DIAGNOSIS — M1990 Unspecified osteoarthritis, unspecified site: Secondary | ICD-10-CM | POA: Diagnosis not present

## 2023-09-16 DIAGNOSIS — Z7901 Long term (current) use of anticoagulants: Secondary | ICD-10-CM | POA: Insufficient documentation

## 2023-09-16 DIAGNOSIS — Z01812 Encounter for preprocedural laboratory examination: Secondary | ICD-10-CM | POA: Diagnosis not present

## 2023-09-16 DIAGNOSIS — I480 Paroxysmal atrial fibrillation: Secondary | ICD-10-CM | POA: Diagnosis not present

## 2023-09-16 DIAGNOSIS — F419 Anxiety disorder, unspecified: Secondary | ICD-10-CM | POA: Diagnosis not present

## 2023-09-16 DIAGNOSIS — F32A Depression, unspecified: Secondary | ICD-10-CM | POA: Diagnosis not present

## 2023-09-16 HISTORY — DX: Other specified postprocedural states: Z98.890

## 2023-09-16 LAB — BASIC METABOLIC PANEL
Anion gap: 10 (ref 5–15)
BUN: 16 mg/dL (ref 8–23)
CO2: 26 mmol/L (ref 22–32)
Calcium: 10.2 mg/dL (ref 8.9–10.3)
Chloride: 103 mmol/L (ref 98–111)
Creatinine, Ser: 0.99 mg/dL (ref 0.44–1.00)
GFR, Estimated: 59 mL/min — ABNORMAL LOW (ref >60)
Glucose, Bld: 95 mg/dL (ref 70–99)
Potassium: 4.1 mmol/L (ref 3.5–5.1)
Sodium: 139 mmol/L (ref 135–145)

## 2023-09-16 LAB — CBC
HCT: 46.5 % — ABNORMAL HIGH (ref 36.0–46.0)
Hemoglobin: 15.4 g/dL — ABNORMAL HIGH (ref 12.0–15.0)
MCH: 31 pg (ref 26.0–34.0)
MCHC: 33.1 g/dL (ref 30.0–36.0)
MCV: 93.8 fL (ref 80.0–100.0)
Platelets: 281 10*3/uL (ref 150–400)
RBC: 4.96 MIL/uL (ref 3.87–5.11)
RDW: 12.9 % (ref 11.5–15.5)
WBC: 8 10*3/uL (ref 4.0–10.5)
nRBC: 0 % (ref 0.0–0.2)

## 2023-09-16 LAB — SURGICAL PCR SCREEN
MRSA, PCR: NEGATIVE
Staphylococcus aureus: NEGATIVE

## 2023-09-17 ENCOUNTER — Other Ambulatory Visit: Payer: Self-pay | Admitting: Nurse Practitioner

## 2023-09-17 ENCOUNTER — Encounter (HOSPITAL_COMMUNITY): Payer: Self-pay

## 2023-09-17 DIAGNOSIS — I480 Paroxysmal atrial fibrillation: Secondary | ICD-10-CM

## 2023-09-17 NOTE — Progress Notes (Signed)
DISCUSSION: [name] is a 77 yo female who presents to PAT prior to surgery above. PMH of HTN, HLD, PAF on Eliquis, GERD, hypothyroid, depression, anxiety, arthritis  Prior anesthesia complications include PONV and prolonged emergence  Patient follows with Cardiology for hx of A.fib. Last seen in clinic on 08/05/2023. Noted to be doing well from cardiac standpoint. Cleared for surgery:  "Pre-operative cardiovascular risk assessment  Ms. Stanphill perioperative risk of a major cardiac event is 0.4% according to the Revised Cardiac Risk Index (RCRI).  Therefore, she is at low risk for perioperative complications.   Her functional capacity is good at 5.07 METs according to the Duke Activity Status Index (DASI). Recommendations: According to ACC/AHA guidelines, no further cardiovascular testing needed.  The patient may proceed to surgery at acceptable risk.   Antiplatelet and/or Anticoagulation Recommendations: Per office office protocol, patient can hold Eliquis for 3 days prior to procedure. Patient will not need bridging with Lovenox around procedure. Will route note to requesting party."  VS: BP (!) 157/65   Pulse 77   Temp 36.7 C (Oral)   Resp 16   Ht 5' 1.75" (1.568 m)   Wt 79.8 kg   SpO2 96%   BMI 32.45 kg/m   PROVIDERS: Ignatius Specking, MD Cardiologist: Nona Dell, MD     LABS: Labs reviewed: Acceptable for surgery. (all labs ordered are listed, but only abnormal results are displayed)  Labs Reviewed  BASIC METABOLIC PANEL - Abnormal; Notable for the following components:      Result Value   GFR, Estimated 59 (*)    All other components within normal limits  CBC - Abnormal; Notable for the following components:   Hemoglobin 15.4 (*)    HCT 46.5 (*)    All other components within normal limits  SURGICAL PCR SCREEN     IMAGES:   EKG:   CV:  Cardiac monitor 02/2023: Predominant rhythm is sinus with heart rate ranging from 53 bpm up to 138 bpm and  average heart rate 71 bpm. PR prolongation noted, no pauses or high degree heart block. There were rare PACs and PVCs representing less than 1% total beats. No sustained arrhythmias.   Echocardiogram on 11/14/2018: Study Conclusions   - Left ventricle: The cavity size was normal. Wall thickness was    increased in a pattern of moderate LVH. Systolic function was    normal. The estimated ejection fraction was in the range of 55%    to 60%. Wall motion was normal; there were no regional wall    motion abnormalities. Features are consistent with a pseudonormal    left ventricular filling pattern, with concomitant abnormal    relaxation and increased filling pressure (grade 2 diastolic    dysfunction).   Impressions:   - Normal LV systolic function; moderate diastolic dysfunction;    moderate LVH.  Past Medical History:  Diagnosis Date   Anxiety    Arthritis    Atrial fibrillation [facility])    Documented December 2019   Complication of anesthesia    slow to wake up   Depression    GERD (gastroesophageal reflux disease)    Heart murmur    Hyperlipidemia    Hypertension    Hypothyroidism    PONV (postoperative nausea and vomiting)     Past Surgical History:  Procedure Laterality Date   APPENDECTOMY     BUNIONECTOMY Bilateral    CATARACT EXTRACTION W/PHACO Right 10/09/2017   Procedure: CATARACT EXTRACTION PHACO AND INTRAOCULAR LENS PLACEMENT (  IOC);  Surgeon: Fabio Pierce, MD;  [facility];  Service: Ophthalmology;  Laterality: Right;  CDE: 6.65   CATARACT EXTRACTION W/PHACO Left 12/11/2017   Procedure: CATARACT EXTRACTION PHACO AND INTRAOCULAR LENS PLACEMENT (IOC);  Surgeon: Fabio Pierce, MD;  Location: [facility];  Service: Ophthalmology;  Laterality: Left;  CDE: 5.28   COLONOSCOPY  06/18/2012   Procedure: COLONOSCOPY;  Surgeon: Malissa Hippo, MD;  [facility];  Service: Endoscopy;  Laterality: N/A;  730   COLONOSCOPY N/A 07/04/2019   Rehman: one 7 mm polyp  in proximal ascending colon, removed, clips placed to treat postpolypectomy bleed (tubular adenoma), diverticulosis in sigmoid colon, hepatic flexure, external hemorrhoids   ESOPHAGOGASTRODUODENOSCOPY     Hammer toe repair Left    HEMORRHOID SURGERY     KNEE ARTHROSCOPY     left 06-2019   REVERSE SHOULDER ARTHROPLASTY Right 07/12/2021   Procedure: REVERSE SHOULDER ARTHROPLASTY;  Surgeon: Beverely Low, MD;  [facility];  Service: Orthopedics;  Laterality: Right;  with ISB   SHOULDER ARTHROSCOPY     right   THYROIDECTOMY     TONSILLECTOMY     TOTAL ABDOMINAL HYSTERECTOMY     TOTAL KNEE ARTHROPLASTY Right 06/29/2020   Procedure: TOTAL KNEE ARTHROPLASTY;  Surgeon: Beverely Low, MD;  [facility];  Service: Orthopedics;  Laterality: Right;  adductor canal   TOTAL SHOULDER ARTHROPLASTY Left 02/06/2017   TOTAL SHOULDER ARTHROPLASTY Left 02/06/2017   Procedure: TOTAL SHOULDER ARTHROPLASTY;  Surgeon: Beverely Low, MD;  Location: [facility] OR;  Service: Orthopedics;  Laterality: Left;    MEDICATIONS:  acetaminophen (TYLENOL) 500 MG tablet   ALPRAZolam (XANAX) 0.25 MG tablet   apixaban (ELIQUIS) 5 MG TABS tablet   ascorbic acid (VITAMIN C) 500 MG tablet   CALCIUM MAGNESIUM ZINC PO   Cholecalciferol (VITAMIN D3) 50 MCG (2000 UT) capsule   citalopram (CELEXA) 20 MG tablet   diltiazem (CARDIZEM CD) 240 MG 24 hr capsule   fluorometholone (FML) 0.1 % ophthalmic suspension   Lactobacillus-Inulin (PROBIOTIC DIGESTIVE SUPPORT) CAPS   levothyroxine (SYNTHROID, LEVOTHROID) 75 MCG tablet   MAGNESIUM PO   metoprolol tartrate (LOPRESSOR) 25 MG tablet   Multiple Vitamins-Minerals (MULTIVITAMIN WITH MINERALS) tablet   polyethylene glycol (MIRALAX / GLYCOLAX) 17 g packet   spironolactone (ALDACTONE) 25 MG tablet   No current facility-administered medications for this encounter.   Marcille Blanco [facility] Surgical Short Stay/Anesthesiology [facility] Phone [phone] 09/17/2023 2:13  PM

## 2023-09-17 NOTE — Anesthesia Preprocedure Evaluation (Addendum)
Anesthesia Evaluation  Patient identified by MRN, date of birth, ID band Patient awake    Reviewed: Allergy & Precautions, NPO status , Patient's Chart, lab work & pertinent test results, reviewed documented beta blocker date and time   History of Anesthesia Complications (+) PONV and history of anesthetic complications  Airway Mallampati: II  TM Distance: >3 FB Neck ROM: Full    Dental no notable dental hx.    Pulmonary neg pulmonary ROS   Pulmonary exam normal        Cardiovascular hypertension, Pt. on home beta blockers and Pt. on medications Normal cardiovascular exam+ dysrhythmias Atrial Fibrillation   TTE 2019: Normal LV systolic function; moderate diastolic dysfunction; moderate LVH    Neuro/Psych negative neurological ROS  negative psych ROS   GI/Hepatic Neg liver ROS,GERD  ,,  Endo/Other  Hypothyroidism    Renal/GU negative Renal ROS  negative genitourinary   Musculoskeletal negative musculoskeletal ROS (+)    Abdominal   Peds  Hematology negative hematology ROS (+)   Anesthesia Other Findings Day of surgery medications reviewed with patient.  Reproductive/Obstetrics negative OB ROS                             Anesthesia Physical Anesthesia Plan  ASA: 2  Anesthesia Plan: Spinal   Post-op Pain Management: Tylenol PO (pre-op)* and Regional block*   Induction:   PONV Risk Score and Plan: 4 or greater and Treatment may vary due to age or medical condition, Ondansetron, Propofol infusion and Dexamethasone  Airway Management Planned: Natural Airway and Simple Face Mask  Additional Equipment: None  Intra-op Plan:   Post-operative Plan:   Informed Consent: I have reviewed the patients History and Physical, chart, labs and discussed the procedure including the risks, benefits and alternatives for the proposed anesthesia with the patient or authorized representative who has  indicated his/her understanding and acceptance.       Plan Discussed with: CRNA  Anesthesia Plan Comments: (See PAT note from 12/11 by Sherlie Ban PA-C )        Anesthesia Quick Evaluation

## 2023-10-06 ENCOUNTER — Encounter: Payer: Self-pay | Admitting: Nurse Practitioner

## 2023-10-06 ENCOUNTER — Ambulatory Visit: Payer: Medicare Other | Attending: Nurse Practitioner | Admitting: Nurse Practitioner

## 2023-10-06 VITALS — BP 157/82 | HR 77 | Ht 62.0 in | Wt 174.5 lb

## 2023-10-06 DIAGNOSIS — R002 Palpitations: Secondary | ICD-10-CM

## 2023-10-06 DIAGNOSIS — E785 Hyperlipidemia, unspecified: Secondary | ICD-10-CM | POA: Diagnosis not present

## 2023-10-06 DIAGNOSIS — E039 Hypothyroidism, unspecified: Secondary | ICD-10-CM | POA: Diagnosis not present

## 2023-10-06 DIAGNOSIS — Z01810 Encounter for preprocedural cardiovascular examination: Secondary | ICD-10-CM | POA: Diagnosis not present

## 2023-10-06 DIAGNOSIS — I480 Paroxysmal atrial fibrillation: Secondary | ICD-10-CM

## 2023-10-06 DIAGNOSIS — I10 Essential (primary) hypertension: Secondary | ICD-10-CM

## 2023-10-06 NOTE — Progress Notes (Unsigned)
Cardiology Office Note:    Date:  08/05/2023 ID:  [name], DOB 05/27/1946, MRN [id number] PCP:  Ignatius Specking, MD [facility] Providers Cardiologist:  Nona Dell, MD    Referring MD: Ignatius Specking, MD   CC: A-fib/ palpitations follow-up  History of Present Illness:    [name] is a delightful 77 y.o. female with a PMH of PAF, HTN, hypothyroidism, HLD, anxiety, and depression, who presents today for A-fib follow-up.   Last seen by Dr. Diona Browner on July 30, 2022.  She was doing overall very well.    I saw her for follow-up on April 30, 2023. She was not able to wear monitor the full amount of time d/t reaction of monitor. Noted rare episodes of A-fib since. Denied any recent episodes of A-fib and denied any chest pain, shortness of breath, syncope, presyncope, dizziness, orthopnea, PND, swelling or significant weight changes, acute bleeding, or claudication. Parameters given for taking Lopressor.  Today she presents for follow-up. Has noticed about 4-6 episodes of A-fib since last visit. She says Lopressor is helping and within 1-1.5 hours after taking medication, A-fib "calms down." Denies any specific triggers to her palpitations, begins randomly. During episodes of A-fib, also admits to mid chest tightness, and left shoulder blade becomes sore, next day typically has no energy. Denies any shortness of breath, syncope, presyncope, dizziness, orthopnea, PND, swelling or significant weight changes, acute bleeding, or claudication. Pending left TKA - our office received clearance on 07/31/2023.   Metoprolol helped  - 5 episodes  No cp, Breasthing good. BP - 157/82. Pending surgery 12/20.  Feet keep swelling  - fine now  [place] In AM, hurt   SH: In her free time, she enjoys crossword puzzles, reading, and Bible studies.   ROS:    Please see the history of present illness.    All other systems reviewed and are negative.  EKGs/Labs/Other Studies Reviewed:     The following studies were reviewed today:   EKG:  EKG is not ordered today.   Cardiac monitor 02/2023: Predominant rhythm is sinus with heart rate ranging from 53 bpm up to 138 bpm and average heart rate 71 bpm. PR prolongation noted, no pauses or high degree heart block. There were rare PACs and PVCs representing less than 1% total beats. No sustained arrhythmias.  Echocardiogram on 11/14/2018: Study Conclusions   - Left ventricle: The cavity size was normal. Wall thickness was    increased in a pattern of moderate LVH. Systolic function was    normal. The estimated ejection fraction was in the range of 55%    to 60%. Wall motion was normal; there were no regional wall    motion abnormalities. Features are consistent with a pseudonormal    left ventricular filling pattern, with concomitant abnormal    relaxation and increased filling pressure (grade 2 diastolic    dysfunction).   Impressions:   - Normal LV systolic function; moderate diastolic dysfunction;    moderate LVH.  Risk Assessment/Calculations:    CHA2DS2-VASc Score = 4  This indicates a 4.8% annual risk of stroke. The patient's score is based upon: CHF History: 0 HTN History: 1 Diabetes History: 0 Stroke History: 0 Vascular Disease History: 0 Age Score: 2 Gender Score: 1    STOP-Bang Score:  3  {   Physical Exam:    VS:  There were no vitals taken for this visit.    Wt Readings from Last 3 Encounters:  09/16/23  176 lb (79.8 kg)  08/05/23 177 lb 3.2 oz (80.4 kg)  04/30/23 176 lb (79.8 kg)    GEN: Obese, 77 y.o. female in no acute distress HEENT: Normal NECK: No JVD; No carotid bruits CARDIAC: S1/S2, RRR, no murmurs, rubs, gallops; 2+ pulses RESPIRATORY:  Clear to auscultation without rales, wheezing or rhonchi  MUSCULOSKELETAL:  No edema; No deformity  SKIN: Warm and dry, thin skin NEUROLOGIC:  Alert and oriented x 3 PSYCHIATRIC:  Normal affect   ASSESSMENT & PLAN:    In order of  problems listed above:  PAF, palpitations, atypical chest pain Admits to 4-6 episodes of A-fib since last OV. Past monitor was reassuring - Lopressor does help calm down A-fib. CP r/t episodes of A-fib. No indication for ischemic evaluation at this time. Discussed avoiding triggers to A-fib and previously recommended Kardia mobile app. Discussed A-fib clinic/ EP/referral and she declines at this time. Past labs unremarkable. Heart healthy diet and regular cardiovascular exercise encouraged.  Continue current medication regimen.  ED precautions discussed. Care and ED precautions discussed.  HTN BP stable.  Continue current medication regimen. Discussed to monitor BP at home at least 2 hours after medications and sitting for 5-10 minutes. Heart healthy diet and regular cardiovascular exercise encouraged.   HLD LDL 86 in 2023.  Being managed by PCP.  Continue simvastatin. Heart healthy diet and regular cardiovascular exercise encouraged.   Hypothyroidism Past labs stable.  Continue levothyroxine. Continue to follow with PCP.  5. Pre-operative cardiovascular risk assessment  Ms. Haskins perioperative risk of a major cardiac event is  % according to the Revised Cardiac Risk Index (RCRI).  Therefore, she is at low risk for perioperative complications.   Her functional capacity is good at   METs according to the Duke Activity Status Index (DASI). Recommendations: According to ACC/AHA guidelines, no further cardiovascular testing needed.  The patient may proceed to surgery at acceptable risk.   Antiplatelet and/or Anticoagulation Recommendations: Per office office protocol, patient can hold Eliquis for 3 days prior to procedure. Patient will not need bridging with Lovenox around procedure. Will route note to requesting party.   5. Disposition: Follow-up with Dr. Diona Browner or APP in 2 months via telephone visit or sooner if anything changes.  14 minutes  Medication Adjustments/Labs and Tests  Ordered: Current medicines are reviewed at length with the patient today.  Concerns regarding medicines are outlined above.  No orders of the defined types were placed in this encounter.  No orders of the defined types were placed in this encounter.   There are no Patient Instructions on file for this visit.   Signed, Sharlene Dory, NP

## 2023-10-06 NOTE — Patient Instructions (Signed)

## 2023-10-28 NOTE — H&P (Signed)
Patient's anticipated LOS is less than 2 midnights, meeting these requirements: - Younger than 39 - Lives within 1 hour of care - Has a competent adult at home to recover with post-op recover - NO history of  - Chronic pain requiring opiods  - Diabetes  - Coronary Artery Disease  - Heart failure  - Heart attack  - Stroke  - DVT/VTE  - Cardiac arrhythmia  - Respiratory Failure/COPD  - Renal failure  - Anemia  - Advanced Liver disease     [name] is an 77 y.o. female.    Chief Complaint: left knee pain  HPI: Pt is a 77 y.o. female complaining of left knee pain for multiple years. Pain had continually increased since the beginning. X-rays in the clinic show end-stage arthritic changes of the left knee. Pt has tried various conservative treatments which have failed to alleviate their symptoms, including injections and therapy. Various options are discussed with the patient. Risks, benefits and expectations were discussed with the patient. Patient understand the risks, benefits and expectations and wishes to proceed with surgery.   PCP:  Ignatius Specking, MD  D/C Plans: Home  PMH: Past Medical History:  Diagnosis Date   Anxiety    Arthritis    Atrial fibrillation [facility])    Documented December 2019   Complication of anesthesia    slow to wake up   Depression    GERD (gastroesophageal reflux disease)    Heart murmur    Hyperlipidemia    Hypertension    Hypothyroidism    PONV (postoperative nausea and vomiting)     PSH: Past Surgical History:  Procedure Laterality Date   APPENDECTOMY     BUNIONECTOMY Bilateral    CATARACT EXTRACTION W/PHACO Right 10/09/2017   Procedure: CATARACT EXTRACTION PHACO AND INTRAOCULAR LENS PLACEMENT (IOC);  Surgeon: Fabio Pierce, MD;  Location: [facility] ORS;  Service: Ophthalmology;  Laterality: Right;  CDE: 6.65   CATARACT EXTRACTION W/PHACO Left 12/11/2017   Procedure: CATARACT EXTRACTION PHACO AND INTRAOCULAR LENS PLACEMENT (IOC);   Surgeon: Fabio Pierce, MD;  Location: [facility] ORS;  Service: Ophthalmology;  Laterality: Left;  CDE: 5.28   COLONOSCOPY  06/18/2012   Procedure: COLONOSCOPY;  Surgeon: Malissa Hippo, MD;  Location: [facility] END[facility] SUITE;  Service: Endoscopy;  Laterality: N/A;  730   COLONOSCOPY N/A 07/04/2019   Rehman: one 7 mm polyp in proximal ascending colon, removed, clips placed to treat postpolypectomy bleed (tubular adenoma), diverticulosis in sigmoid colon, hepatic flexure, external hemorrhoids   ESOPHAGOGASTRODUODENOSCOPY     Hammer toe repair Left    HEMORRHOID SURGERY     KNEE ARTHROSCOPY     left 06-2019   REVERSE SHOULDER ARTHROPLASTY Right 07/12/2021   Procedure: REVERSE SHOULDER ARTHROPLASTY;  Surgeon: Beverely Low, MD;  Location: [facility];  Service: Orthopedics;  Laterality: Right;  with ISB   SHOULDER ARTHROSCOPY     right   THYROIDECTOMY     TONSILLECTOMY     TOTAL ABDOMINAL HYSTERECTOMY     TOTAL KNEE ARTHROPLASTY Right 06/29/2020   Procedure: TOTAL KNEE ARTHROPLASTY;  Surgeon: Beverely Low, MD;  Location: [facility];  Service: Orthopedics;  Laterality: Right;  adductor canal   TOTAL SHOULDER ARTHROPLASTY Left 02/06/2017   TOTAL SHOULDER ARTHROPLASTY Left 02/06/2017   Procedure: TOTAL SHOULDER ARTHROPLASTY;  Surgeon: Beverely Low, MD;  Location: [facility] OR;  Service: Orthopedics;  Laterality: Left;    Social History:  reports that she has never smoked. She has never been exposed to tobacco smoke.  She has never used smokeless tobacco. She reports that she does not drink alcohol and does not use drugs. BMI: Estimated body mass index is 31.92 kg/m as calculated from the following:   Height as of 10/06/23: 5\' 2"  (1.575 m).   Weight as of 10/06/23: 79.2 kg.  Lab Results  Component Value Date   ALBUMIN 4.6 12/26/2022   Diabetes: Patient does not have a diagnosis of diabetes.     Smoking Status:   reports that she has never smoked. She has never been exposed to tobacco smoke. She has never used  smokeless tobacco.    Allergies:  Allergies  Allergen Reactions   Other Nausea And Vomiting    Opiate based pain medicines    Latex Rash   Penicillins Rash   Sulfa Antibiotics Rash   Tramadol Itching    Medications: No current facility-administered medications for this encounter.   Current Outpatient Medications  Medication Sig Dispense Refill   acetaminophen (TYLENOL) 500 MG tablet Take 1,000 mg by mouth every 6 (six) hours as needed (pain.).      ALPRAZolam (XANAX) 0.25 MG tablet Take 0.25 mg by mouth at bedtime as needed for sleep.     apixaban (ELIQUIS) 5 MG TABS tablet Take 1 tablet (5 mg total) by mouth 2 (two) times daily. 28 tablet 0   ascorbic acid (VITAMIN C) 500 MG tablet Take 1,000 mg by mouth daily.     CALCIUM MAGNESIUM ZINC PO Take 2 tablets by mouth daily.     Cholecalciferol (VITAMIN D3) 50 MCG (2000 UT) capsule Take 4,000 Units by mouth daily.     citalopram (CELEXA) 20 MG tablet Take 20 mg by mouth daily.     diltiazem (CARDIZEM CD) 240 MG 24 hr capsule Take 1 capsule (240 mg total) by mouth daily. 90 capsule 1   fluorometholone (FML) 0.1 % ophthalmic suspension Place 1 drop into both eyes 2 (two) times daily.     Lactobacillus-Inulin (PROBIOTIC DIGESTIVE SUPPORT) CAPS Take 1 capsule by mouth daily.     levothyroxine (SYNTHROID, LEVOTHROID) 75 MCG tablet Take 75 mcg by mouth daily before breakfast.      MAGNESIUM PO Take 2 tablets by mouth daily.     Multiple Vitamins-Minerals (MULTIVITAMIN WITH MINERALS) tablet Take 1 tablet by mouth daily. One A Day for Women     polyethylene glycol (MIRALAX / GLYCOLAX) 17 g packet Take 17 g by mouth daily as needed for moderate constipation.     spironolactone (ALDACTONE) 25 MG tablet TAKE 1 TABLET BY MOUTH  DAILY 90 tablet 1   metoprolol tartrate (LOPRESSOR) 25 MG tablet TAKE 1 TABLET BY MOUTH EVERY 8  HOURS AS NEEDED FOR PALPITATIONS 100 tablet 2    No results found for this or any previous visit (from the past 48  hour(s)). No results found.  ROS: Pain with rom of the left lower extremity  Physical Exam: Alert and oriented 77 y.o. female in no acute distress Cranial nerves 2-12 intact Cervical spine: full rom with no tenderness, nv intact distally Chest: active breath sounds bilaterally, no wheeze rhonchi or rales Heart: regular rate and rhythm, no murmur Abd: non tender non distended with active bowel sounds Hip is stable with rom  Left knee painful rom with crepitus Nv intact distally No rashes or edema Antalgic gait  Assessment/Plan Assessment: left total knee arthroplasty  Plan:  Patient will undergo a left total knee by Dr. Ranell Patrick at [facility] Risks benefits and expectations were discussed with the  patient. Patient understand risks, benefits and expectations and wishes to proceed. Preoperative templating of the joint replacement has been completed, documented, and submitted to the Operating Room personnel in order to optimize intra-operative equipment management.   Alphonsa Overall PA-C, MPAS [facility] is now [employer] [address], [address], [place], [place] [address] Phone: [phone] [email or web address] Facebook  [employer]

## 2023-10-30 NOTE — Progress Notes (Signed)
Anesthesia Review:  PCP: [name] - have called and requested clearance from [name] at [facility].  LVMM  clearance on chart dated 08/03/23.  LOV 08/03/23 on chart.  PT had clearances 08/03/23 when she was here in October but not scanned in EPIC.   Cardiologist : [name]  Deborah Peck,NP clearance telephone visit on 10/06/23 , LOV 08/05/23 on chart  Chest x-ray : EKG : 12/25/22  Echo : 2019  Stress test: Cardiac Cath :  Activity level: can do a flight of stairs without diffidutly  Sleep Study/ CPAP : none  Fasting Blood Sugar :      / Checks Blood Sugar -- times a day:   Blood Thinner/ Instructions /Last Dose: ASA / Instructions/ Last Dose :    Eliquis- Last dose on 11/09/23.   PT was scheduled for surgery 08/2023 and cancelled due to insurance denies her going to a rehab facility.   Pt now states her dtr is coming for 3 days then a friend is coming for 2 weeks after that.  PT states she is supposed to stay in hospital until 11/15/23 then go home.  Daughter [name] called during preop appt and [name] was adamant about all the details of she is supposed to stay in hospital untl 11/15/23 and that surgery goes as planned.  Daughter lives 4 hours away per daughter.  Informed dtr that pt was givne surgery scheduler number and that preop nurse would also call surgery scheduler.  Called and spoke with Deborah Millet at office then spoke with [name] Ps in regards to above.  [name] to speak with [name] at office to make sure that pt stays in hospital untl 11/15/23 per pt and then daghter to be with her for 3 days then frieind to come in.  [name] , daughter wants to be called in regards to this to make sure all worked out.  [name] made aware and stated she would inform [name] at office.

## 2023-10-30 NOTE — Patient Instructions (Addendum)
SURGICAL WAITING ROOM VISITATION  Patients having surgery or a procedure may have no more than 2 support people in the waiting area - these visitors may rotate.    Children under the age of 35 must have an adult with them who is not the patient.  Due to an increase in RSV and influenza rates and associated hospitalizations, children ages 20 and under may not visit patients in [facility] hospitals.  If the patient needs to stay at the hospital during part of their recovery, the visitor guidelines for inpatient rooms apply. Pre-op nurse will coordinate an appropriate time for 1 support person to accompany patient in pre-op.  This support person may not rotate.    Please refer to the [facility] website for the visitor guidelines for Inpatients (after your surgery is over and you are in a regular room).       Your procedure is scheduled on:  11/13/2023    Report to [facility] Main Entrance    Report to admitting at  1100 AM   Call this number if you have problems the morning of surgery [phone]   Do not eat food :After Midnight.   After Midnight you may have the following liquids until _ 1030_____ AM DAY OF SURGERY  Water Non-Citrus Juices (without pulp, NO RED-Apple, White grape, White cranberry) Black Coffee (NO MILK/CREAM OR CREAMERS, sugar ok)  Clear Tea (NO MILK/CREAM OR CREAMERS, sugar ok) regular and decaf                             Plain Jell-O (NO RED)                                           Fruit ices (not with fruit pulp, NO RED)                                     Popsicles (NO RED)                                                               Sports drinks like Gatorade (NO RED)                   The day of surgery:  Drink ONE (1) Pre-Surgery Clear Ensure or G2 at 1030 AM ( have completed by )  the morning of surgery. Drink in one sitting. Do not sip.  This drink was given to you during your hospital  pre-op appointment visit. Nothing else to  drink after completing the  Pre-Surgery Clear Ensure or G2.          If you have questions, please contact your surgeon's office.       Oral Hygiene is also important to reduce your risk of infection.                                    Remember - BRUSH YOUR TEETH THE MORNING OF SURGERY WITH YOUR REGULAR  TOOTHPASTE  DENTURES WILL BE REMOVED PRIOR TO SURGERY PLEASE DO NOT APPLY "Poly grip" OR ADHESIVES!!!   Do NOT smoke after Midnight   Stop all vitamins and herbal supplements 7 days before surgery.   Take these medicines the morning of surgery with A SIP OF WATER:   celexa, cardizem, synthroid, metoprolol if needed   DO NOT TAKE ANY ORAL DIABETIC MEDICATIONS DAY OF YOUR SURGERY  Bring CPAP mask and tubing day of surgery.                              You may not have any metal on your body including hair pins, jewelry, and body piercing             Do not wear make-up, lotions, powders, perfumes/cologne, or deodorant  Do not wear nail polish including gel and S&S, artificial/acrylic nails, or any other type of covering on natural nails including finger and toenails. If you have artificial nails, gel coating, etc. that needs to be removed by a nail salon please have this removed prior to surgery or surgery may need to be canceled/ delayed if the surgeon/ anesthesia feels like they are unable to be safely monitored.   Do not shave  48 hours prior to surgery.               Men may shave face and neck.   Do not bring valuables to the hospital. [facility] IS NOT             RESPONSIBLE   FOR VALUABLES.   Contacts, glasses, dentures or bridgework may not be worn into surgery.   Bring small overnight bag day of surgery.   DO NOT BRING YOUR HOME MEDICATIONS TO THE HOSPITAL. PHARMACY WILL DISPENSE MEDICATIONS LISTED ON YOUR MEDICATION LIST TO YOU DURING YOUR ADMISSION IN THE HOSPITAL!    Patients discharged on the day of surgery will not be allowed to drive home.  Someone NEEDS to  stay with you for the first 24 hours after anesthesia.   Special Instructions: Bring a copy of your healthcare power of attorney and living will documents the day of surgery if you haven't scanned them before.              Please read over the following fact sheets you were given: IF YOU HAVE QUESTIONS ABOUT YOUR PRE-OP INSTRUCTIONS PLEASE CALL [phone]   If you received a COVID test during your pre-op visit  it is requested that you wear a mask when out in public, stay away from anyone that may not be feeling well and notify your surgeon if you develop symptoms. If you test positive for Covid or have been in contact with anyone that has tested positive in the last 10 days please notify you surgeon.      Pre-operative 5 CHG Bath Instructions   You can play a key role in reducing the risk of infection after surgery. Your skin needs to be as free of germs as possible. You can reduce the number of germs on your skin by washing with CHG (chlorhexidine gluconate) soap before surgery. CHG is an antiseptic soap that kills germs and continues to kill germs even after washing.   DO NOT use if you have an allergy to chlorhexidine/CHG or antibacterial soaps. If your skin becomes reddened or irritated, stop using the CHG and notify one of our RNs at [phone].   Please shower with  the CHG soap starting 4 days before surgery using the following schedule:     Please keep in mind the following:  DO NOT shave, including legs and underarms, starting the day of your first shower.   You may shave your face at any point before/day of surgery.  Place clean sheets on your bed the day you start using CHG soap. Use a clean washcloth (not used since being washed) for each shower. DO NOT sleep with pets once you start using the CHG.   CHG Shower Instructions:  If you choose to wash your hair and private area, wash first with your normal shampoo/soap.  After you use shampoo/soap, rinse your hair and body  thoroughly to remove shampoo/soap residue.  Turn the water OFF and apply about 3 tablespoons (45 ml) of CHG soap to a CLEAN washcloth.  Apply CHG soap ONLY FROM YOUR NECK DOWN TO YOUR TOES (washing for 3-5 minutes)  DO NOT use CHG soap on face, private areas, open wounds, or sores.  Pay special attention to the area where your surgery is being performed.  If you are having back surgery, having someone wash your back for you may be helpful. Wait 2 minutes after CHG soap is applied, then you may rinse off the CHG soap.  Pat dry with a clean towel  Put on clean clothes/pajamas   If you choose to wear lotion, please use ONLY the CHG-compatible lotions on the back of this paper.     Additional instructions for the day of surgery: DO NOT APPLY any lotions, deodorants, cologne, or perfumes.   Put on clean/comfortable clothes.  Brush your teeth.  Ask your nurse before applying any prescription medications to the skin.      CHG Compatible Lotions   Aveeno Moisturizing lotion  Cetaphil Moisturizing Cream  Cetaphil Moisturizing Lotion  Clairol Herbal Essence Moisturizing Lotion, Dry Skin  Clairol Herbal Essence Moisturizing Lotion, Extra Dry Skin  Clairol Herbal Essence Moisturizing Lotion, Normal Skin  Curel Age Defying Therapeutic Moisturizing Lotion with Alpha Hydroxy  Curel Extreme Care Body Lotion  Curel Soothing Hands Moisturizing Hand Lotion  Curel Therapeutic Moisturizing Cream, Fragrance-Free  Curel Therapeutic Moisturizing Lotion, Fragrance-Free  Curel Therapeutic Moisturizing Lotion, Original Formula  Eucerin Daily Replenishing Lotion  Eucerin Dry Skin Therapy Plus Alpha Hydroxy Crme  Eucerin Dry Skin Therapy Plus Alpha Hydroxy Lotion  Eucerin Original Crme  Eucerin Original Lotion  Eucerin Plus Crme Eucerin Plus Lotion  Eucerin TriLipid Replenishing Lotion  Keri Anti-Bacterial Hand Lotion  Keri Deep Conditioning Original Lotion Dry Skin Formula Softly Scented  Keri  Deep Conditioning Original Lotion, Fragrance Free Sensitive Skin Formula  Keri Lotion Fast Absorbing Fragrance Free Sensitive Skin Formula  Keri Lotion Fast Absorbing Softly Scented Dry Skin Formula  Keri Original Lotion  Keri Skin Renewal Lotion Keri Silky Smooth Lotion  Keri Silky Smooth Sensitive Skin Lotion  Nivea Body Creamy Conditioning Oil  Nivea Body Extra Enriched Teacher, adult education Moisturizing Lotion Nivea Crme  Nivea Skin Firming Lotion  NutraDerm 30 Skin Lotion  NutraDerm Skin Lotion  NutraDerm Therapeutic Skin Cream  NutraDerm Therapeutic Skin Lotion  ProShield Protective Hand Cream  Provon moisturizing lotion

## 2023-11-04 ENCOUNTER — Encounter (HOSPITAL_COMMUNITY): Payer: Self-pay

## 2023-11-04 ENCOUNTER — Encounter (HOSPITAL_COMMUNITY)
Admission: RE | Admit: 2023-11-04 | Discharge: 2023-11-04 | Disposition: A | Discharge status: Home / Self Care | Payer: Medicare Other | Source: Ambulatory Visit | Attending: Nurse Practitioner | Admitting: Nurse Practitioner

## 2023-11-04 ENCOUNTER — Other Ambulatory Visit: Payer: Self-pay

## 2023-11-04 VITALS — BP 144/80 | HR 72 | Temp 98.3°F | Resp 16 | Ht 61.75 in | Wt 169.0 lb

## 2023-11-04 DIAGNOSIS — I10 Essential (primary) hypertension: Secondary | ICD-10-CM | POA: Diagnosis not present

## 2023-11-04 DIAGNOSIS — E039 Hypothyroidism, unspecified: Secondary | ICD-10-CM | POA: Diagnosis not present

## 2023-11-04 DIAGNOSIS — I4891 Unspecified atrial fibrillation: Secondary | ICD-10-CM | POA: Diagnosis not present

## 2023-11-04 DIAGNOSIS — M1712 Unilateral primary osteoarthritis, left knee: Secondary | ICD-10-CM | POA: Diagnosis not present

## 2023-11-04 DIAGNOSIS — F419 Anxiety disorder, unspecified: Secondary | ICD-10-CM | POA: Diagnosis not present

## 2023-11-04 DIAGNOSIS — Z01818 Encounter for other preprocedural examination: Secondary | ICD-10-CM

## 2023-11-04 DIAGNOSIS — F32A Depression, unspecified: Secondary | ICD-10-CM | POA: Diagnosis not present

## 2023-11-04 DIAGNOSIS — Z01812 Encounter for preprocedural laboratory examination: Secondary | ICD-10-CM | POA: Diagnosis not present

## 2023-11-04 LAB — BASIC METABOLIC PANEL
Anion gap: 9 (ref 5–15)
BUN: 15 mg/dL (ref 8–23)
CO2: 27 mmol/L (ref 22–32)
Calcium: 10.9 mg/dL — ABNORMAL HIGH (ref 8.9–10.3)
Chloride: 101 mmol/L (ref 98–111)
Creatinine, Ser: 0.84 mg/dL (ref 0.44–1.00)
GFR, Estimated: >60 mL/min (ref >60)
Glucose, Bld: 88 mg/dL (ref 70–99)
Potassium: 4.6 mmol/L (ref 3.5–5.1)
Sodium: 137 mmol/L (ref 135–145)

## 2023-11-04 LAB — CBC
HCT: 49.9 % — ABNORMAL HIGH (ref 36.0–46.0)
Hemoglobin: 16.4 g/dL — ABNORMAL HIGH (ref 12.0–15.0)
MCH: 30.8 pg (ref 26.0–34.0)
MCHC: 32.9 g/dL (ref 30.0–36.0)
MCV: 93.8 fL (ref 80.0–100.0)
Platelets: 298 10*3/uL (ref 150–400)
RBC: 5.32 MIL/uL — ABNORMAL HIGH (ref 3.87–5.11)
RDW: 12.9 % (ref 11.5–15.5)
WBC: 8 10*3/uL (ref 4.0–10.5)
nRBC: 0 % (ref 0.0–0.2)

## 2023-11-04 LAB — SURGICAL PCR SCREEN
MRSA, PCR: NEGATIVE
Staphylococcus aureus: NEGATIVE

## 2023-11-05 ENCOUNTER — Encounter (HOSPITAL_COMMUNITY): Payer: Self-pay

## 2023-11-05 NOTE — Progress Notes (Signed)
Case: [id number] Date/Time: 11/13/23 1315   Procedure: TOTAL KNEE ARTHROPLASTY (Left: Knee) - general anesthesia 120 flip room   Anesthesia type: Spinal   Pre-op diagnosis: Left knee osteoarthritis   Location: [facility]OM 07 / [facility]   Surgeons: Beverely Low, MD       DISCUSSION: [name] is a 77 yo female who presents to PAT prior to surgery above. PMH of HTN, heart murmur, A.fib on Eliquis, GERD, hypothyroidism, depression, anxiety, arthritis.  Prior complications from anesthesia includes PONV and prolonged emergence.  Patient follows with Cardiology for hx of A.fib. Last seen in clinic on 10/06/2023. Noted to be doing well from cardiac standpoint. She does notice when she goes in to A.fib and will take a Lopressor for palpitations which helps. Cleared for surgery:  " Pre-operative cardiovascular risk assessment  [name] perioperative risk of a major cardiac event is 0.4% according to the Revised Cardiac Risk Index (RCRI).  Therefore, she is at low risk for perioperative complications.   Her functional capacity is good at 5.07 METs according to the Duke Activity Status Index (DASI). Recommendations: According to ACC/AHA guidelines, no further cardiovascular testing needed.  The patient may proceed to surgery at acceptable risk.   Antiplatelet and/or Anticoagulation Recommendations: Per office office protocol, patient can hold Eliquis for 3 days prior to procedure. Patient will not need bridging with Lovenox around procedure - see previous progress note. Will route note to requesting party."  VS: BP (!) 144/80   Pulse 72   Temp 36.8 C (Oral)   Resp 16   Ht 5' 1.75" (1.568 m)   Wt 76.7 kg   SpO2 96%   BMI 31.16 kg/m   PROVIDERS: Ignatius Specking, MD   LABS: Labs reviewed: Acceptable for surgery. (all labs ordered are listed, but only abnormal results are displayed)  Labs Reviewed  BASIC METABOLIC PANEL - Abnormal; Notable for the following components:      Result  Value   Calcium 10.9 (*)    All other components within normal limits  CBC - Abnormal; Notable for the following components:   RBC 5.32 (*)    Hemoglobin 16.4 (*)    HCT 49.9 (*)    All other components within normal limits  SURGICAL PCR SCREEN     IMAGES:   EKG:   CV: Cardiac monitor 02/2023: Predominant rhythm is sinus with heart rate ranging from 53 bpm up to 138 bpm and average heart rate 71 bpm. PR prolongation noted, no pauses or high degree heart block. There were rare PACs and PVCs representing less than 1% total beats. No sustained arrhythmias.   Echocardiogram on 11/14/2018: Study Conclusions   - Left ventricle: The cavity size was normal. Wall thickness was    increased in a pattern of moderate LVH. Systolic function was    normal. The estimated ejection fraction was in the range of 55%    to 60%. Wall motion was normal; there were no regional wall    motion abnormalities. Features are consistent with a pseudonormal    left ventricular filling pattern, with concomitant abnormal    relaxation and increased filling pressure (grade 2 diastolic    dysfunction).   Impressions:   - Normal LV systolic function; moderate diastolic dysfunction;    moderate LVH. Past Medical History:  Diagnosis Date   Anxiety    Arthritis    Atrial fibrillation [facility])    Documented December 2019   Complication of anesthesia    slow to  wake up   Depression    GERD (gastroesophageal reflux disease)    Heart murmur    Hyperlipidemia    Hypertension    Hypothyroidism    PONV (postoperative nausea and vomiting)     Past Surgical History:  Procedure Laterality Date   APPENDECTOMY     BUNIONECTOMY Bilateral    CATARACT EXTRACTION W/PHACO Right 10/09/2017   Procedure: CATARACT EXTRACTION PHACO AND INTRAOCULAR LENS PLACEMENT (IOC);  Surgeon: Fabio Pierce, MD;  Location: [facility];  Service: Ophthalmology;  Laterality: Right;  CDE: 6.65   CATARACT EXTRACTION W/PHACO Left 12/11/2017    Procedure: CATARACT EXTRACTION PHACO AND INTRAOCULAR LENS PLACEMENT (IOC);  Surgeon: Fabio Pierce, MD;  Location: [facility];  Service: Ophthalmology;  Laterality: Left;  CDE: 5.28   COLONOSCOPY  06/18/2012   Procedure: COLONOSCOPY;  Surgeon: Malissa Hippo, MD;  Location[facility];  Service: Endoscopy;  Laterality: N/A;  730   COLONOSCOPY N/A 07/04/2019   Rehman: one 7 mm polyp in proximal ascending colon, removed, clips placed to treat postpolypectomy bleed (tubular adenoma), diverticulosis in sigmoid colon, hepatic flexure, external hemorrhoids   ESOPHAGOGASTRODUODENOSCOPY     Hammer toe repair Left    HEMORRHOID SURGERY     KNEE ARTHROSCOPY     left 06-2019   REVERSE SHOULDER ARTHROPLASTY Right 07/12/2021   Procedure: REVERSE SHOULDER ARTHROPLASTY;  Surgeon: Beverely Low, MD;  Location[facility];  Service: Orthopedics;  Laterality: Right;  with ISB   SHOULDER ARTHROSCOPY     right   THYROIDECTOMY     TONSILLECTOMY     TOTAL ABDOMINAL HYSTERECTOMY     TOTAL KNEE ARTHROPLASTY Right 06/29/2020   Procedure: TOTAL KNEE ARTHROPLASTY;  Surgeon: Beverely Low, MD;  Location[facility];  Service: Orthopedics;  Laterality: Right;  adductor canal   TOTAL SHOULDER ARTHROPLASTY Left 02/06/2017   TOTAL SHOULDER ARTHROPLASTY Left 02/06/2017   Procedure: TOTAL SHOULDER ARTHROPLASTY;  Surgeon: Beverely Low, MD;  Location: [facility] OR;  Service: Orthopedics;  Laterality: Left;    MEDICATIONS:  acetaminophen (TYLENOL) 500 MG tablet   ALPRAZolam (XANAX) 0.25 MG tablet   apixaban (ELIQUIS) 5 MG TABS tablet   ascorbic acid (VITAMIN C) 500 MG tablet   CALCIUM MAGNESIUM ZINC PO   Cholecalciferol (VITAMIN D3) 50 MCG (2000 UT) capsule   citalopram (CELEXA) 20 MG tablet   diltiazem (CARDIZEM CD) 240 MG 24 hr capsule   fluorometholone (FML) 0.1 % ophthalmic suspension   Lactobacillus-Inulin (PROBIOTIC DIGESTIVE SUPPORT) CAPS   levothyroxine (SYNTHROID, LEVOTHROID) 75 MCG tablet   MAGNESIUM PO   metoprolol  tartrate (LOPRESSOR) 25 MG tablet   Multiple Vitamins-Minerals (MULTIVITAMIN WITH MINERALS) tablet   polyethylene glycol (MIRALAX / GLYCOLAX) 17 g packet   spironolactone (ALDACTONE) 25 MG tablet   No current facility-administered medications for this encounter.   Marcille Blanco [facility] Short Stay/Anesthesiology [facility] Phone [phone] 11/05/2023 1:37 PM

## 2023-11-11 ENCOUNTER — Ambulatory Visit: Payer: Medicare Other | Admitting: Cardiology

## 2023-11-13 ENCOUNTER — Observation Stay (HOSPITAL_COMMUNITY)
Admission: RE | Admit: 2023-11-13 | Discharge: 2023-11-17 | Disposition: A | Discharge status: Home / Self Care | Payer: Medicare Other | Source: Ambulatory Visit | Attending: Orthopedic Surgery | Admitting: Orthopedic Surgery

## 2023-11-13 ENCOUNTER — Other Ambulatory Visit: Payer: Self-pay

## 2023-11-13 ENCOUNTER — Encounter (HOSPITAL_COMMUNITY)
Admission: RE | Disposition: A | Discharge status: Home / Self Care | Payer: Self-pay | Source: Ambulatory Visit | Attending: Orthopedic Surgery

## 2023-11-13 ENCOUNTER — Encounter (HOSPITAL_COMMUNITY): Payer: Self-pay | Admitting: Orthopedic Surgery

## 2023-11-13 ENCOUNTER — Ambulatory Visit (HOSPITAL_COMMUNITY): Payer: Medicare Other | Admitting: Medical

## 2023-11-13 ENCOUNTER — Ambulatory Visit (HOSPITAL_COMMUNITY): Payer: Medicare Other | Admitting: Anesthesiology

## 2023-11-13 DIAGNOSIS — Z96612 Presence of left artificial shoulder joint: Secondary | ICD-10-CM | POA: Diagnosis not present

## 2023-11-13 DIAGNOSIS — M1712 Unilateral primary osteoarthritis, left knee: Secondary | ICD-10-CM | POA: Diagnosis not present

## 2023-11-13 DIAGNOSIS — I4891 Unspecified atrial fibrillation: Secondary | ICD-10-CM | POA: Insufficient documentation

## 2023-11-13 DIAGNOSIS — Z91040 Latex allergy status: Secondary | ICD-10-CM | POA: Diagnosis not present

## 2023-11-13 DIAGNOSIS — Z96652 Presence of left artificial knee joint: Principal | ICD-10-CM

## 2023-11-13 DIAGNOSIS — Z7901 Long term (current) use of anticoagulants: Secondary | ICD-10-CM | POA: Diagnosis not present

## 2023-11-13 DIAGNOSIS — Z79899 Other long term (current) drug therapy: Secondary | ICD-10-CM | POA: Diagnosis not present

## 2023-11-13 DIAGNOSIS — Z01818 Encounter for other preprocedural examination: Secondary | ICD-10-CM

## 2023-11-13 DIAGNOSIS — I10 Essential (primary) hypertension: Secondary | ICD-10-CM | POA: Diagnosis not present

## 2023-11-13 DIAGNOSIS — Z96651 Presence of right artificial knee joint: Secondary | ICD-10-CM | POA: Insufficient documentation

## 2023-11-13 DIAGNOSIS — G8918 Other acute postprocedural pain: Secondary | ICD-10-CM | POA: Diagnosis not present

## 2023-11-13 DIAGNOSIS — Z96611 Presence of right artificial shoulder joint: Secondary | ICD-10-CM | POA: Diagnosis not present

## 2023-11-13 DIAGNOSIS — E039 Hypothyroidism, unspecified: Secondary | ICD-10-CM | POA: Insufficient documentation

## 2023-11-13 HISTORY — PX: TOTAL KNEE ARTHROPLASTY: SHX125

## 2023-11-13 SURGERY — ARTHROPLASTY, KNEE, TOTAL
Anesthesia: Spinal | Site: Knee | Laterality: Left

## 2023-11-13 MED ORDER — METHOCARBAMOL 500 MG PO TABS: 500.0000 mg | ORAL_TABLET | Freq: Three times a day (TID) | ORAL | 1 refills | Status: DC | PRN

## 2023-11-13 MED ORDER — DILTIAZEM HCL ER COATED BEADS 240 MG PO CP24
240.0000 mg | ORAL_CAPSULE | Freq: Every day | ORAL | Status: DC
  Administered 2023-11-14 – 2023-11-17 (×4): 240 mg via ORAL
  Filled 2023-11-13 (×4): qty 1

## 2023-11-13 MED ORDER — PHENYLEPHRINE HCL-NACL 20-0.9 MG/250ML-% IV SOLN
INTRAVENOUS | Status: DC | PRN
  Administered 2023-11-13: 20 ug/min via INTRAVENOUS

## 2023-11-13 MED ORDER — ONDANSETRON HCL 4 MG PO TABS
4.0000 mg | ORAL_TABLET | Freq: Four times a day (QID) | ORAL | Status: DC | PRN
  Administered 2023-11-17: 4 mg via ORAL
  Filled 2023-11-13: qty 1

## 2023-11-13 MED ORDER — VITAMIN D 25 MCG (1000 UNIT) PO TABS
4000.0000 [IU] | ORAL_TABLET | Freq: Every day | ORAL | Status: DC
  Administered 2023-11-14 – 2023-11-17 (×4): 4000 [IU] via ORAL
  Filled 2023-11-13 (×4): qty 4

## 2023-11-13 MED ORDER — DEXAMETHASONE SODIUM PHOSPHATE 10 MG/ML IJ SOLN
INTRAMUSCULAR | Status: AC
  Filled 2023-11-13: qty 1

## 2023-11-13 MED ORDER — BUPIVACAINE-EPINEPHRINE 0.25% -1:200000 IJ SOLN
INTRAMUSCULAR | Status: AC
  Filled 2023-11-13: qty 1

## 2023-11-13 MED ORDER — DEXMEDETOMIDINE HCL IN NACL 80 MCG/20ML IV SOLN
INTRAVENOUS | Status: DC | PRN
  Administered 2023-11-13: 8 ug via INTRAVENOUS

## 2023-11-13 MED ORDER — PHENOL 1.4 % MT LIQD: 1.0000 | OROMUCOSAL | Status: DC | PRN

## 2023-11-13 MED ORDER — BUPIVACAINE-EPINEPHRINE 0.25% -1:200000 IJ SOLN
INTRAMUSCULAR | Status: DC | PRN
  Administered 2023-11-13: 30 mL

## 2023-11-13 MED ORDER — FLUOROMETHOLONE 0.1 % OP SUSP
1.0000 [drp] | Freq: Two times a day (BID) | OPHTHALMIC | Status: DC
  Administered 2023-11-14 – 2023-11-17 (×7): 1 [drp] via OPHTHALMIC
  Filled 2023-11-13: qty 5

## 2023-11-13 MED ORDER — ORAL CARE MOUTH RINSE
15.0000 mL | Freq: Once | OROMUCOSAL | Status: AC
Start: 2023-11-13 — End: 2023-11-13

## 2023-11-13 MED ORDER — PROBIOTIC DIGESTIVE SUPPORT PO CAPS: 1.0000 | ORAL_CAPSULE | Freq: Every day | ORAL | Status: DC

## 2023-11-13 MED ORDER — CEFAZOLIN SODIUM-DEXTROSE 2-4 GM/100ML-% IV SOLN: 2.0000 g | INTRAVENOUS | Status: DC

## 2023-11-13 MED ORDER — POVIDONE-IODINE 10 % EX SWAB: 2.0000 | Freq: Once | CUTANEOUS | Status: DC

## 2023-11-13 MED ORDER — PHENYLEPHRINE HCL (PRESSORS) 10 MG/ML IV SOLN
INTRAVENOUS | Status: DC | PRN
  Administered 2023-11-13 (×2): 80 ug via INTRAVENOUS

## 2023-11-13 MED ORDER — LIDOCAINE HCL (PF) 2 % IJ SOLN
INTRAMUSCULAR | Status: AC
Start: 2023-11-13 — End: ?
  Filled 2023-11-13: qty 5

## 2023-11-13 MED ORDER — LEVOTHYROXINE SODIUM 75 MCG PO TABS
75.0000 ug | ORAL_TABLET | Freq: Every day | ORAL | Status: DC
  Administered 2023-11-14 – 2023-11-17 (×4): 75 ug via ORAL
  Filled 2023-11-13 (×4): qty 1

## 2023-11-13 MED ORDER — APIXABAN 5 MG PO TABS
5.0000 mg | ORAL_TABLET | Freq: Two times a day (BID) | ORAL | Status: DC
  Administered 2023-11-14 – 2023-11-17 (×7): 5 mg via ORAL
  Filled 2023-11-13 (×7): qty 1

## 2023-11-13 MED ORDER — PROPOFOL 500 MG/50ML IV EMUL
INTRAVENOUS | Status: DC | PRN
  Administered 2023-11-13: 35 ug/kg/min via INTRAVENOUS

## 2023-11-13 MED ORDER — SODIUM CHLORIDE 0.9 % IR SOLN
Status: DC | PRN
  Administered 2023-11-13: 1000 mL

## 2023-11-13 MED ORDER — 0.9 % SODIUM CHLORIDE (POUR BTL) OPTIME
TOPICAL | Status: DC | PRN
  Administered 2023-11-13: 1000 mL

## 2023-11-13 MED ORDER — CEFAZOLIN SODIUM-DEXTROSE 2-4 GM/100ML-% IV SOLN
2.0000 g | Freq: Four times a day (QID) | INTRAVENOUS | Status: AC
  Administered 2023-11-13 – 2023-11-14 (×2): 2 g via INTRAVENOUS
  Filled 2023-11-13 (×2): qty 100

## 2023-11-13 MED ORDER — POLYETHYLENE GLYCOL 3350 17 G PO PACK
17.0000 g | PACK | Freq: Every day | ORAL | Status: DC | PRN
Start: 2023-11-13 — End: 2023-11-17
  Administered 2023-11-16: 17 g via ORAL
  Filled 2023-11-13 (×2): qty 1

## 2023-11-13 MED ORDER — SPIRONOLACTONE 25 MG PO TABS
25.0000 mg | ORAL_TABLET | Freq: Every day | ORAL | Status: DC
  Administered 2023-11-14 – 2023-11-17 (×4): 25 mg via ORAL
  Filled 2023-11-13 (×4): qty 1

## 2023-11-13 MED ORDER — SODIUM CHLORIDE 0.9% FLUSH: 3.0000 mL | INTRAVENOUS | Status: DC | PRN

## 2023-11-13 MED ORDER — ONDANSETRON HCL 4 MG/2ML IJ SOLN
INTRAMUSCULAR | Status: AC
Start: 2023-11-13 — End: ?
  Filled 2023-11-13: qty 2

## 2023-11-13 MED ORDER — BUPIVACAINE LIPOSOME 1.3 % IJ SUSP
INTRAMUSCULAR | Status: AC
  Filled 2023-11-13: qty 20

## 2023-11-13 MED ORDER — CEFAZOLIN SODIUM-DEXTROSE 2-4 GM/100ML-% IV SOLN
2.0000 g | INTRAVENOUS | Status: AC
  Administered 2023-11-13: 2 g via INTRAVENOUS
  Filled 2023-11-13: qty 100

## 2023-11-13 MED ORDER — METHOCARBAMOL 1000 MG/10ML IJ SOLN: 500.0000 mg | Freq: Four times a day (QID) | INTRAMUSCULAR | Status: DC | PRN

## 2023-11-13 MED ORDER — HYDROCODONE-ACETAMINOPHEN 5-325 MG PO TABS: 1.0000 | ORAL_TABLET | Freq: Four times a day (QID) | ORAL | 0 refills | Status: DC | PRN

## 2023-11-13 MED ORDER — ALPRAZOLAM 0.25 MG PO TABS: 0.2500 mg | ORAL_TABLET | Freq: Every evening | ORAL | Status: DC | PRN

## 2023-11-13 MED ORDER — DEXAMETHASONE SODIUM PHOSPHATE 10 MG/ML IJ SOLN
INTRAMUSCULAR | Status: DC | PRN
  Administered 2023-11-13: 5 mg via INTRAVENOUS

## 2023-11-13 MED ORDER — BUPIVACAINE LIPOSOME 1.3 % IJ SUSP
INTRAMUSCULAR | Status: DC | PRN
  Administered 2023-11-13: 20 mL

## 2023-11-13 MED ORDER — CITALOPRAM HYDROBROMIDE 20 MG PO TABS
20.0000 mg | ORAL_TABLET | Freq: Every day | ORAL | Status: DC
  Administered 2023-11-14 – 2023-11-17 (×4): 20 mg via ORAL
  Filled 2023-11-13 (×4): qty 1

## 2023-11-13 MED ORDER — METOCLOPRAMIDE HCL 5 MG PO TABS: 5.0000 mg | ORAL_TABLET | Freq: Three times a day (TID) | ORAL | Status: DC | PRN

## 2023-11-13 MED ORDER — ACETAMINOPHEN 325 MG PO TABS
325.0000 mg | ORAL_TABLET | Freq: Four times a day (QID) | ORAL | Status: DC | PRN
  Administered 2023-11-14: 325 mg via ORAL
  Administered 2023-11-15: 650 mg via ORAL
  Filled 2023-11-13: qty 1
  Filled 2023-11-13 (×3): qty 2

## 2023-11-13 MED ORDER — METOPROLOL TARTRATE 25 MG PO TABS
25.0000 mg | ORAL_TABLET | Freq: Three times a day (TID) | ORAL | Status: DC | PRN
Start: 2023-11-13 — End: 2023-11-17
  Administered 2023-11-14 – 2023-11-15 (×2): 25 mg via ORAL
  Filled 2023-11-13 (×2): qty 1

## 2023-11-13 MED ORDER — WATER FOR IRRIGATION, STERILE IR SOLN
Status: DC | PRN
  Administered 2023-11-13: 1000 mL

## 2023-11-13 MED ORDER — BUPIVACAINE IN DEXTROSE 0.75-8.25 % IT SOLN
INTRATHECAL | Status: DC | PRN
  Administered 2023-11-13: 1.6 mL via INTRATHECAL

## 2023-11-13 MED ORDER — PROPOFOL 10 MG/ML IV BOLUS
INTRAVENOUS | Status: AC
Start: 2023-11-13 — End: ?
  Filled 2023-11-13: qty 20

## 2023-11-13 MED ORDER — MORPHINE SULFATE (PF) 2 MG/ML IV SOLN: 0.5000 mg | INTRAVENOUS | Status: DC | PRN

## 2023-11-13 MED ORDER — BUPIVACAINE-EPINEPHRINE (PF) 0.5% -1:200000 IJ SOLN
INTRAMUSCULAR | Status: DC | PRN
  Administered 2023-11-13: 15 mL via PERINEURAL

## 2023-11-13 MED ORDER — OXYCODONE HCL 5 MG/5ML PO SOLN: 5.0000 mg | Freq: Once | ORAL | Status: DC | PRN

## 2023-11-13 MED ORDER — LACTATED RINGERS IV SOLN: INTRAVENOUS | Status: DC | PRN

## 2023-11-13 MED ORDER — HYDROCODONE-ACETAMINOPHEN 5-325 MG PO TABS
1.0000 | ORAL_TABLET | ORAL | Status: DC | PRN
  Administered 2023-11-13 – 2023-11-17 (×9): 1 via ORAL
  Filled 2023-11-13 (×10): qty 1
  Filled 2023-11-13: qty 2
  Filled 2023-11-13: qty 1

## 2023-11-13 MED ORDER — CHLORHEXIDINE GLUCONATE 0.12 % MT SOLN
15.0000 mL | Freq: Once | OROMUCOSAL | Status: AC
  Administered 2023-11-13: 15 mL via OROMUCOSAL

## 2023-11-13 MED ORDER — ADULT MULTIVITAMIN W/MINERALS CH
1.0000 | ORAL_TABLET | Freq: Every day | ORAL | Status: DC
  Administered 2023-11-14 – 2023-11-17 (×4): 1 via ORAL
  Filled 2023-11-13 (×4): qty 1

## 2023-11-13 MED ORDER — TRANEXAMIC ACID-NACL 1000-0.7 MG/100ML-% IV SOLN
1000.0000 mg | INTRAVENOUS | Status: AC
  Administered 2023-11-13: 1000 mg via INTRAVENOUS
  Filled 2023-11-13: qty 100

## 2023-11-13 MED ORDER — OXYCODONE HCL 5 MG PO TABS: 5.0000 mg | ORAL_TABLET | Freq: Once | ORAL | Status: DC | PRN

## 2023-11-13 MED ORDER — CLONIDINE HCL (ANALGESIA) 100 MCG/ML EP SOLN
EPIDURAL | Status: DC | PRN
  Administered 2023-11-13: 100 ug

## 2023-11-13 MED ORDER — ACETAMINOPHEN 500 MG PO TABS
1000.0000 mg | ORAL_TABLET | Freq: Once | ORAL | Status: AC
  Administered 2023-11-13: 1000 mg via ORAL
  Filled 2023-11-13: qty 2

## 2023-11-13 MED ORDER — FENTANYL CITRATE PF 50 MCG/ML IJ SOSY
50.0000 ug | PREFILLED_SYRINGE | Freq: Once | INTRAMUSCULAR | Status: AC
  Administered 2023-11-13: 50 ug via INTRAVENOUS
  Filled 2023-11-13: qty 2

## 2023-11-13 MED ORDER — POLYETHYLENE GLYCOL 3350 17 G PO PACK: 17.0000 g | PACK | Freq: Every day | ORAL | Status: DC | PRN

## 2023-11-13 MED ORDER — TRANEXAMIC ACID-NACL 1000-0.7 MG/100ML-% IV SOLN
1000.0000 mg | Freq: Once | INTRAVENOUS | Status: AC
  Administered 2023-11-13: 1000 mg via INTRAVENOUS
  Filled 2023-11-13: qty 100

## 2023-11-13 MED ORDER — MENTHOL 3 MG MT LOZG: 1.0000 | LOZENGE | OROMUCOSAL | Status: DC | PRN

## 2023-11-13 MED ORDER — DROPERIDOL 2.5 MG/ML IJ SOLN: 0.6250 mg | Freq: Once | INTRAMUSCULAR | Status: DC | PRN

## 2023-11-13 MED ORDER — DOCUSATE SODIUM 100 MG PO CAPS
100.0000 mg | ORAL_CAPSULE | Freq: Two times a day (BID) | ORAL | Status: DC
  Administered 2023-11-13 – 2023-11-17 (×8): 100 mg via ORAL
  Filled 2023-11-13 (×8): qty 1

## 2023-11-13 MED ORDER — CALCIUM MAGNESIUM ZINC 333-133-5 MG PO TABS: ORAL_TABLET | Freq: Every day | ORAL | Status: DC

## 2023-11-13 MED ORDER — SODIUM CHLORIDE (PF) 0.9 % IJ SOLN
INTRAMUSCULAR | Status: DC | PRN
  Administered 2023-11-13: 30 mL

## 2023-11-13 MED ORDER — METOCLOPRAMIDE HCL 5 MG/ML IJ SOLN: 5.0000 mg | Freq: Three times a day (TID) | INTRAMUSCULAR | Status: DC | PRN

## 2023-11-13 MED ORDER — BUPIVACAINE LIPOSOME 1.3 % IJ SUSP: 20.0000 mL | Freq: Once | INTRAMUSCULAR | Status: DC

## 2023-11-13 MED ORDER — BISACODYL 10 MG RE SUPP
10.0000 mg | Freq: Every day | RECTAL | Status: DC | PRN
  Filled 2023-11-13: qty 1

## 2023-11-13 MED ORDER — VITAMIN C 500 MG PO TABS
1000.0000 mg | ORAL_TABLET | Freq: Every day | ORAL | Status: DC
  Administered 2023-11-14 – 2023-11-17 (×4): 1000 mg via ORAL
  Filled 2023-11-13 (×4): qty 2

## 2023-11-13 MED ORDER — PHENYLEPHRINE 80 MCG/ML (10ML) SYRINGE FOR IV PUSH (FOR BLOOD PRESSURE SUPPORT)
PREFILLED_SYRINGE | INTRAVENOUS | Status: AC
  Filled 2023-11-13: qty 10

## 2023-11-13 MED ORDER — ACETAMINOPHEN 500 MG PO TABS: 1000.0000 mg | ORAL_TABLET | Freq: Four times a day (QID) | ORAL | Status: DC | PRN

## 2023-11-13 MED ORDER — SODIUM CHLORIDE 0.9% FLUSH: 3.0000 mL | Freq: Two times a day (BID) | INTRAVENOUS | Status: DC

## 2023-11-13 MED ORDER — TRANEXAMIC ACID-NACL 1000-0.7 MG/100ML-% IV SOLN: 1000.0000 mg | INTRAVENOUS | Status: DC

## 2023-11-13 MED ORDER — ONDANSETRON HCL 4 MG PO TABS: 4.0000 mg | ORAL_TABLET | Freq: Three times a day (TID) | ORAL | 1 refills | Status: DC | PRN

## 2023-11-13 MED ORDER — FENTANYL CITRATE PF 50 MCG/ML IJ SOSY: 25.0000 ug | PREFILLED_SYRINGE | INTRAMUSCULAR | Status: DC | PRN

## 2023-11-13 MED ORDER — MAGNESIUM 200 MG PO TABS: ORAL_TABLET | Freq: Every day | ORAL | Status: DC

## 2023-11-13 MED ORDER — FENTANYL CITRATE (PF) 100 MCG/2ML IJ SOLN
INTRAMUSCULAR | Status: DC | PRN
  Administered 2023-11-13: 50 ug via INTRAVENOUS

## 2023-11-13 MED ORDER — SODIUM CHLORIDE (PF) 0.9 % IJ SOLN
INTRAMUSCULAR | Status: AC
  Filled 2023-11-13: qty 30

## 2023-11-13 MED ORDER — ONDANSETRON HCL 4 MG/2ML IJ SOLN
4.0000 mg | Freq: Four times a day (QID) | INTRAMUSCULAR | Status: DC | PRN
  Administered 2023-11-14: 4 mg via INTRAVENOUS
  Filled 2023-11-13: qty 2

## 2023-11-13 MED ORDER — ONDANSETRON HCL 4 MG/2ML IJ SOLN
INTRAMUSCULAR | Status: DC | PRN
  Administered 2023-11-13: 4 mg via INTRAVENOUS

## 2023-11-13 MED ORDER — METHOCARBAMOL 500 MG PO TABS
500.0000 mg | ORAL_TABLET | Freq: Four times a day (QID) | ORAL | Status: DC | PRN
  Administered 2023-11-13 – 2023-11-17 (×8): 500 mg via ORAL
  Filled 2023-11-13 (×8): qty 1

## 2023-11-13 MED ORDER — FENTANYL CITRATE (PF) 100 MCG/2ML IJ SOLN
INTRAMUSCULAR | Status: AC
  Filled 2023-11-13: qty 2

## 2023-11-13 MED ORDER — PROPOFOL 1000 MG/100ML IV EMUL
INTRAVENOUS | Status: AC
  Filled 2023-11-13: qty 100

## 2023-11-13 MED ORDER — RISAQUAD PO CAPS
1.0000 | ORAL_CAPSULE | Freq: Every day | ORAL | Status: DC
  Administered 2023-11-13 – 2023-11-17 (×5): 1 via ORAL
  Filled 2023-11-13 (×5): qty 1

## 2023-11-13 SURGICAL SUPPLY — 49 items
ATTUNE PSFEM LTSZ4 NARCEM KNEE (Femur) IMPLANT
ATTUNE PSRP INSR SZ4 8 KNEE (Insert) IMPLANT
BAG COUNTER SPONGE SURGICOUNT (BAG) IMPLANT
BAG ZIPLOCK 12X15 (MISCELLANEOUS) IMPLANT
BASEPLATE TIBIAL ROTATING SZ 4 (Knees) IMPLANT
BLADE SAG 18X100X1.27 (BLADE) ×1 IMPLANT
BLADE SAW SGTL 13X75X1.27 (BLADE) ×1 IMPLANT
BNDG ELASTIC 6X10 VLCR STRL LF (GAUZE/BANDAGES/DRESSINGS) ×1 IMPLANT
BNDG GAUZE DERMACEA FLUFF 4 (GAUZE/BANDAGES/DRESSINGS) ×1 IMPLANT
BOWL SMART MIX CTS (DISPOSABLE) ×1 IMPLANT
CEMENT HV SMART SET (Cement) ×2 IMPLANT
CLSR STERI-STRIP ANTIMIC 1/2X4 (GAUZE/BANDAGES/DRESSINGS) IMPLANT
COVER SURGICAL LIGHT HANDLE (MISCELLANEOUS) ×1 IMPLANT
CUFF TRNQT CYL 34X4.125X (TOURNIQUET CUFF) ×1 IMPLANT
DRAPE INCISE IOBAN 66X45 STRL (DRAPES) IMPLANT
DRAPE SHEET LG 3/4 BI-LAMINATE (DRAPES) ×1 IMPLANT
DRAPE U-SHAPE 47X51 STRL (DRAPES) ×1 IMPLANT
DRESSING MEPILEX FLEX 4X4 (GAUZE/BANDAGES/DRESSINGS) IMPLANT
DRSG ADAPTIC 3X8 NADH LF (GAUZE/BANDAGES/DRESSINGS) ×1 IMPLANT
DRSG EMULSION OIL 3X16 NADH (GAUZE/BANDAGES/DRESSINGS) IMPLANT
DRSG MEPILEX FLEX 4X4 (GAUZE/BANDAGES/DRESSINGS) ×1
DURAPREP 26ML APPLICATOR (WOUND CARE) ×1 IMPLANT
ELECT REM PT RETURN 15FT ADLT (MISCELLANEOUS) ×1 IMPLANT
GAUZE PAD ABD 8X10 STRL (GAUZE/BANDAGES/DRESSINGS) ×1 IMPLANT
GAUZE SPONGE 4X4 12PLY STRL (GAUZE/BANDAGES/DRESSINGS) ×1 IMPLANT
GLOVE BIOGEL PI IND STRL 7.5 (GLOVE) ×1 IMPLANT
GLOVE BIOGEL PI IND STRL 8.5 (GLOVE) ×1 IMPLANT
GLOVE ORTHO TXT STRL SZ7.5 (GLOVE) ×1 IMPLANT
GLOVE SURG ORTHO 8.5 STRL (GLOVE) ×1 IMPLANT
GOWN STRL REUS W/ TWL XL LVL3 (GOWN DISPOSABLE) ×2 IMPLANT
HOLDER FOLEY CATH W/STRAP (MISCELLANEOUS) IMPLANT
IMMOBILIZER KNEE 20 (SOFTGOODS) ×1
IMMOBILIZER KNEE 20 THIGH 36 (SOFTGOODS) IMPLANT
KIT TURNOVER KIT A (KITS) IMPLANT
MANIFOLD NEPTUNE II (INSTRUMENTS) ×1 IMPLANT
NS IRRIG 1000ML POUR BTL (IV SOLUTION) ×1 IMPLANT
PACK TOTAL KNEE CUSTOM (KITS) ×1 IMPLANT
PATELLA MEDIAL ATTUN 35MM KNEE (Knees) IMPLANT
PIN STEINMAN FIXATION KNEE (PIN) IMPLANT
PROTECTOR NERVE ULNAR (MISCELLANEOUS) ×1 IMPLANT
SET HNDPC FAN SPRY TIP SCT (DISPOSABLE) ×1 IMPLANT
STRIP CLOSURE SKIN 1/2X4 (GAUZE/BANDAGES/DRESSINGS) ×2 IMPLANT
SUT MNCRL AB 3-0 PS2 18 (SUTURE) ×1 IMPLANT
SUT VIC AB 0 CT1 36 (SUTURE) ×1 IMPLANT
SUT VIC AB 1 CT1 36 (SUTURE) ×2 IMPLANT
SUT VIC AB 2-0 CT1 TAPERPNT 27 (SUTURE) ×1 IMPLANT
TRAY CATH INTERMITTENT SS 16FR (CATHETERS) ×1 IMPLANT
WATER STERILE IRR 1000ML POUR (IV SOLUTION) ×2 IMPLANT
YANKAUER SUCT BULB TIP NO VENT (SUCTIONS) ×1 IMPLANT

## 2023-11-13 NOTE — Interval H&P Note (Signed)
History and Physical Interval Note:  11/13/2023 1:11 PM  [name]  has presented today for surgery, with the diagnosis of Left knee osteoarthritis.  The various methods of treatment have been discussed with the patient and family. After consideration of risks, benefits and other options for treatment, the patient has consented to  Procedure(s) with comments: TOTAL KNEE ARTHROPLASTY (Left) - general anesthesia 120 flip room as a surgical intervention.  The patient's history has been reviewed, patient examined, no change in status, stable for surgery.  I have reviewed the patient's chart and labs.  Questions were answered to the patient's satisfaction.     Verlee Rossetti

## 2023-11-13 NOTE — Anesthesia Procedure Notes (Signed)
Spinal  Patient location during procedure: OR Start time: 11/13/2023 3:12 PM End time: 11/13/2023 3:15 PM Reason for block: surgical anesthesia Staffing Performed: anesthesiologist  Anesthesiologist: Kaylyn Layer, MD Performed by: Kaylyn Layer, MD Authorized by: Kaylyn Layer, MD   Preanesthetic Checklist Completed: patient identified, IV checked, risks and benefits discussed, surgical consent, monitors and equipment checked, pre-op evaluation and timeout performed Spinal Block Patient position: sitting Prep: DuraPrep and site prepped and draped Patient monitoring: continuous pulse ox, blood pressure and heart rate Approach: midline Location: L3-4 Injection technique: single-shot Needle Needle type: Pencan  Needle gauge: 24 G Needle length: 9 cm Assessment Events: CSF return Additional Notes Risks, benefits, and alternative discussed. Patient gave consent to procedure. Prepped and draped in sitting position. Patient sedated but responsive to voice. Clear CSF obtained after one needle pass. Positive terminal aspiration. No pain or paraesthesias with injection. Patient tolerated procedure well. Vital signs stable. Amalia Greenhouse, MD

## 2023-11-13 NOTE — Plan of Care (Signed)
Problem: Education: Goal: Knowledge of General Education information will improve Description: Including pain rating scale, medication(s)/side effects and non-pharmacologic comfort measures Outcome: Progressing   Problem: Clinical Measurements: Goal: Ability to maintain clinical measurements within normal limits will improve Outcome: Progressing  Problem: Coping: Goal: Level of anxiety will decrease Outcome: Progressing   Haydee Salter, RN 11/13/23 6:48 PM

## 2023-11-13 NOTE — Transfer of Care (Signed)
Immediate Anesthesia Transfer of Care Note  Patient: [name]  Procedure(s) Performed: Procedure(s) with comments: TOTAL KNEE ARTHROPLASTY (Left) - general anesthesia 120 flip room  Patient Location: PACU  Anesthesia Type:Spinal  Level of Consciousness:  sedated, patient cooperative and responds to stimulation  Airway & Oxygen Therapy:Patient Spontanous Breathing and Patient connected to face mask oxgen  Post-op Assessment:  Report given to PACU RN and Post -op Vital signs reviewed and stable  Post vital signs:  Reviewed and stable  Last Vitals:  Vitals:   11/13/23 1417 11/13/23 1420  BP: (!) 135/59 (!) 107/54  Pulse: 70 (!) 55  Resp: 16 15  Temp:    SpO2: 92% 95%    Complications: No apparent anesthesia complications

## 2023-11-13 NOTE — Progress Notes (Signed)
Orthopedic Tech Progress Note Patient Details:  [name] 03/03/1946 [id number] CPM will be taken off at 9:45pm by nursing staff.  CPM Left Knee CPM Left Knee: On Left Knee Flexion (Degrees): 90 Left Knee Extension (Degrees): 0  Post Interventions Patient Tolerated: Well Ortho Devices Type of Ortho Device: Bone foam zero knee Ortho Device/Splint Location: Left Knee Ortho Device/Splint Interventions: Application   Post Interventions Patient Tolerated: Well  Genelle Bal Sovereign Ramiro 11/13/2023, 5:58 PM

## 2023-11-13 NOTE — Discharge Instructions (Signed)
Ice to the knee constantly.  Keep the incision covered and clean and dry for one week, then ok to get it wet in the shower. Please remove the Aquacel bandage after one week and leave it off.   Do exercise as instructed every hour, please to prevent stiffness.    DO NOT prop anything under the knee, it will make your knee stiff.  Prop under the ankle to encourage your knee to go straight.   Use the walker while you are up and around for balance.  Wear your support stockings 24/7 to prevent blood clots.   Take your Eliquis as you have been prescribed starting on Saturday, 11/14/23  Follow up with Dr Ranell Patrick in two weeks in the office, call [phone] for appt  Please call Dr Ranell Patrick (cell) [phone] with any questions or concerns  INSTRUCTIONS AFTER JOINT REPLACEMENT   Remove items at home which could result in a fall. This includes throw rugs or furniture in walking pathways ICE to the affected joint every three hours while awake for 30 minutes at a time, for at least the first 3-5 days, and then as needed for pain and swelling.  Continue to use ice for pain and swelling. You may notice swelling that will progress down to the foot and ankle.  This is normal after surgery.  Elevate your leg when you are not up walking on it.   Continue to use the breathing machine you got in the hospital (incentive spirometer) which will help keep your temperature down.  It is common for your temperature to cycle up and down following surgery, especially at night when you are not up moving around and exerting yourself.  The breathing machine keeps your lungs expanded and your temperature down.   DIET:  As you were doing prior to hospitalization, we recommend a well-balanced diet.  DRESSING / WOUND CARE / SHOWERING  You may change your dressing 3-5 days after surgery.  Then change the dressing every day with sterile gauze.  Please use good hand washing techniques before changing the dressing.  Do not use any  lotions or creams on the incision until instructed by your surgeon.  ACTIVITY  Increase activity slowly as tolerated, but follow the weight bearing instructions below.   No driving for 6 weeks or until further direction given by your physician.  You cannot drive while taking narcotics.  No lifting or carrying greater than 10 lbs. until further directed by your surgeon. Avoid periods of inactivity such as sitting longer than an hour when not asleep. This helps prevent blood clots.  You may return to work once you are authorized by your doctor.     WEIGHT BEARING   Weight bearing as tolerated with assist device (walker, cane, etc) as directed, use it as long as suggested by your surgeon or therapist, typically at least 4-6 weeks.   EXERCISES  Results after joint replacement surgery are often greatly improved when you follow the exercise, range of motion and muscle strengthening exercises prescribed by your doctor. Safety measures are also important to protect the joint from further injury. Any time any of these exercises cause you to have increased pain or swelling, decrease what you are doing until you are comfortable again and then slowly increase them. If you have problems or questions, call your caregiver or physical therapist for advice.   Rehabilitation is important following a joint replacement. After just a few days of immobilization, the muscles of the leg can  become weakened and shrink (atrophy).  These exercises are designed to build up the tone and strength of the thigh and leg muscles and to improve motion. Often times heat used for twenty to thirty minutes before working out will loosen up your tissues and help with improving the range of motion but do not use heat for the first two weeks following surgery (sometimes heat can increase post-operative swelling).   These exercises can be done on a training (exercise) mat, on the floor, on a table or on a bed. Use whatever works the  best and is most comfortable for you.    Use music or television while you are exercising so that the exercises are a pleasant break in your day. This will make your life better with the exercises acting as a break in your routine that you can look forward to.   Perform all exercises about fifteen times, three times per day or as directed.  You should exercise both the operative leg and the other leg as well.  Exercises include:   Quad Sets - Tighten up the muscle on the front of the thigh (Quad) and hold for 5-10 seconds.   Straight Leg Raises - With your knee straight (if you were given a brace, keep it on), lift the leg to 60 degrees, hold for 3 seconds, and slowly lower the leg.  Perform this exercise against resistance later as your leg gets stronger.  Leg Slides: Lying on your back, slowly slide your foot toward your buttocks, bending your knee up off the floor (only go as far as is comfortable). Then slowly slide your foot back down until your leg is flat on the floor again.  Angel Wings: Lying on your back spread your legs to the side as far apart as you can without causing discomfort.  Hamstring Strength:  Lying on your back, push your heel against the floor with your leg straight by tightening up the muscles of your buttocks.  Repeat, but this time bend your knee to a comfortable angle, and push your heel against the floor.  You may put a pillow under the heel to make it more comfortable if necessary.   A rehabilitation program following joint replacement surgery can speed recovery and prevent re-injury in the future due to weakened muscles. Contact your doctor or a physical therapist for more information on knee rehabilitation.    CONSTIPATION  Constipation is defined medically as fewer than three stools per week and severe constipation as less than one stool per week.  Even if you have a regular bowel pattern at home, your normal regimen is likely to be disrupted due to multiple reasons  following surgery.  Combination of anesthesia, postoperative narcotics, change in appetite and fluid intake all can affect your bowels.   YOU MUST use at least one of the following options; they are listed in order of increasing strength to get the job done.  They are all available over the counter, and you may need to use some, POSSIBLY even all of these options:    Drink plenty of fluids (prune juice may be helpful) and high fiber foods Colace 100 mg by mouth twice a day  Senokot for constipation as directed and as needed Dulcolax (bisacodyl), take with full glass of water  Miralax (polyethylene glycol) once or twice a day as needed.  If you have tried all these things and are unable to have a bowel movement in the first 3-4 days after surgery call  either your surgeon or your primary doctor.    If you experience loose stools or diarrhea, hold the medications until you stool forms back up.  If your symptoms do not get better within 1 week or if they get worse, check with your doctor.  If you experience "the worst abdominal pain ever" or develop nausea or vomiting, please contact the office immediately for further recommendations for treatment.   ITCHING:  If you experience itching with your medications, try taking only a single pain pill, or even half a pain pill at a time.  You can also use Benadryl over the counter for itching or also to help with sleep.   TED HOSE STOCKINGS:  Use stockings on both legs until for at least 2 weeks or as directed by physician office. They may be removed at night for sleeping.  MEDICATIONS:  See your medication summary on the "After Visit Summary" that nursing will review with you.  You may have some home medications which will be placed on hold until you complete the course of blood thinner medication.  It is important for you to complete the blood thinner medication as prescribed.  PRECAUTIONS:  If you experience chest pain or shortness of breath - call 911  immediately for transfer to the hospital emergency department.   If you develop a fever greater that 101 F, purulent drainage from wound, increased redness or drainage from wound, foul odor from the wound/dressing, or calf pain - CONTACT YOUR SURGEON.                                                   FOLLOW-UP APPOINTMENTS:  If you do not already have a post-op appointment, please call the office for an appointment to be seen by your surgeon.  Guidelines for how soon to be seen are listed in your "After Visit Summary", but are typically between 1-4 weeks after surgery.  OTHER INSTRUCTIONS:   Knee Replacement:  Do not place pillow under knee, focus on keeping the knee straight while resting. CPM instructions: 0-90 degrees, 2 hours in the morning, 2 hours in the afternoon, and 2 hours in the evening. Place foam block, curve side up under heel at all times except when in CPM or when walking.  DO NOT modify, tear, cut, or change the foam block in any way.  POST-OPERATIVE OPIOID TAPER INSTRUCTIONS: It is important to wean off of your opioid medication as soon as possible. If you do not need pain medication after your surgery it is ok to stop day one. Opioids include: Codeine, Hydrocodone(Norco, Vicodin), Oxycodone(Percocet, oxycontin) and hydromorphone amongst others.  Long term and even short term use of opiods can cause: Increased pain response Dependence Constipation Depression Respiratory depression And more.  Withdrawal symptoms can include Flu like symptoms Nausea, vomiting And more Techniques to manage these symptoms Hydrate well Eat regular healthy meals Stay active Use relaxation techniques(deep breathing, meditating, yoga) Do Not substitute Alcohol to help with tapering If you have been on opioids for less than two weeks and do not have pain than it is ok to stop all together.  Plan to wean off of opioids This plan should start within one week post op of your joint  replacement. Maintain the same interval or time between taking each dose and first decrease the dose.  Cut the total  daily intake of opioids by one tablet each day Next start to increase the time between doses. The last dose that should be eliminated is the evening dose.   MAKE SURE YOU:  Understand these instructions.  Get help right away if you are not doing well or get worse.    Thank you for letting us be a part of your medical care team.  It is a privilege we respect greatly.  We hope these instructions will help you stay on track for a fast and full recovery!

## 2023-11-13 NOTE — Brief Op Note (Signed)
11/13/2023  4:59 PM  PATIENT:  [name]  77 y.o. female  PRE-OPERATIVE DIAGNOSIS:  Left knee osteoarthritis, end stage  POST-OPERATIVE DIAGNOSIS:  Left knee osteoarthritis, end stage  PROCEDURE:  Left Total Knee Arthroplasty DePuy Attune  SURGEON:  Surgeons and Role:    Beverely Low, MD - Primary  PHYSICIAN ASSISTANT:   ASSISTANTS: Thea Gist, PA-C   ANESTHESIA:   regional and spinal  EBL:  minimal   BLOOD ADMINISTERED:none  DRAINS: none   LOCAL MEDICATIONS USED:  MARCAINE     SPECIMEN:  No Specimen  DISPOSITION OF SPECIMEN:  N/A  COUNTS:  YES  TOURNIQUET:   Total Tourniquet Time Documented: Thigh (Left) - 82 minutes Total: Thigh (Left) - 82 minutes   DICTATION: .Other Dictation: Dictation Number [id number]  PLAN OF CARE: Admit to inpatient   PATIENT DISPOSITION:  PACU - hemodynamically stable.   Delay start of Pharmacological VTE agent (>24hrs) due to surgical blood loss or risk of bleeding: no

## 2023-11-13 NOTE — Anesthesia Procedure Notes (Signed)
Anesthesia Regional Block: Adductor canal block   Pre-Anesthetic Checklist: , timeout performed,  Correct Patient, Correct Site, Correct Laterality,  Correct Procedure, Correct Position, site marked,  Risks and benefits discussed,  Pre-op evaluation,  At surgeon's request and post-op pain management  Laterality: Left  Prep: Maximum Sterile Barrier Precautions used, chloraprep       Needles:  Injection technique: Single-shot  Needle Type: Echogenic Stimulator Needle     Needle Length: 9cm  Needle Gauge: 22     Additional Needles:   Procedures:,,,, ultrasound used (permanent image in chart),,    Narrative:  Start time: 11/13/2023 2:15 PM End time: 11/13/2023 2:18 PM Injection made incrementally with aspirations every 5 mL.  Performed by: Personally  Anesthesiologist: Kaylyn Layer, MD  Additional Notes: Risks, benefits, and alternative discussed. Patient gave consent for procedure. Patient prepped and draped in sterile fashion. Sedation administered, patient remains easily responsive to voice. Relevant anatomy identified with ultrasound guidance. Local anesthetic given in 5cc increments with no signs or symptoms of intravascular injection. No pain or paraesthesias with injection. Patient monitored throughout procedure with signs of LAST or immediate complications. Tolerated well. Ultrasound image placed in chart.  Amalia Greenhouse, MD

## 2023-11-13 NOTE — Op Note (Unsigned)
[name], Roberts MEDICAL RECORD NO: [id number] ACCOUNT NO: [id number] DATE OF BIRTH: 17-Jun-1946 FACILITY: [facility] LOCATION: [id number] PHYSICIAN: Almedia Balls. Ranell Patrick, MD  Operative Report   DATE OF PROCEDURE: 11/13/2023  PREOPERATIVE DIAGNOSIS:  Left knee end-stage arthritis.  POSTOPERATIVE DIAGNOSIS:  Left knee end-stage arthritis.  PROCEDURE PERFORMED:  Left total knee arthroplasty using DePuy Attune prosthesis.  ATTENDING SURGEON:  Almedia Balls. Ranell Patrick, MD  ASSISTANT:  Konrad Felix Dixon, [place], who was scrubbed during the entire procedure, and necessary for satisfactory completion of surgery.  ANESTHESIA:  Spinal anesthesia plus adductor canal block.  ESTIMATED BLOOD LOSS:  Minimal.  FLUID REPLACEMENT:  1000 mL crystalloid.  COUNTS:  Instrument count was correct.  COMPLICATIONS:  There were no complications.  ANTIBIOTICS:  Perioperative antibiotics were given.  TOURNIQUET TIME:  82 minutes at 300 mmHg.  INDICATIONS:  The patient is a 77 year old female who presents with worsening left knee pain secondary to bone-on-bone arthritis. The patient has an unstable knee and fairly constant pain interfering with activities of daily living and mobility.  The  patient desires operative total knee arthroplasty to eliminate pain and restore function. Informed consent was obtained.  DESCRIPTION OF PROCEDURE:  After an adequate level of spinal anesthesia was achieved, the patient was positioned supine on the operating room table.  A nonsterile tourniquet was placed on the left proximal thigh.  The left leg was sterilely prepped and  draped in the usual manner.  Timeout called verifying correct patient and correct site.  The patient did have hyperextension of the knee and significant pseudolaxity to varus and valgus stress.  After a sterile prep and drape and timeout and exam, we  elevated the leg and exsanguinated with the Esmarch bandage, inflating the tourniquet to 300 mmHg.  We then placed the  knee in flexion and performed a longitudinal midline incision with a 10 blade scalpel dissection down through the subcutaneous tissues.   We used a fresh 10-blade scalpel for the medial parapatellar arthrotomy we divided lateral patellofemoral ligaments, everting the patella and exposing the distal femur.  There was no cartilage on the distal femur with complete bone-on-bone.  We entered  the distal femur with a step-cut drill.  We then placed an intramedullary guide and resected 9 mm off the distal femur set on 5 degrees of valgus.  We then went ahead and sized our femur to a size 4, anterior down, performing anterior, posterior, and  chamfer cuts with the 4-in-1 block.  We then removed ACL, PCL, and meniscal tissues, subluxing the tibia anteriorly, and then performing our tibia cut with the external jig 90 degrees perpendicular to the long axis of the tibia with minimal posterior  slope, resecting 2 mm off the affected medial side.  Once we had our tibia cut done, we placed a laminar spreader, removed posterior femoral condyle osteophytes, and also removed some of the scarred posterior capsule.  We then injected the posterior  capsule with a combination of Marcaine, Exparel and saline for postop pain control.  Next, we checked our gaps, which were symmetric at 6 mm.  We then completed our tibial preparation of the 4 tibia with a modular drill and keel punch.  We then did our  box cut for the 4 left femur.  Once we had our femur trial in place, we drilled our lug holes.  We then trialed with first a 5 and a 6 mm poly trial.  We were able to get to full extension with that and  had decent flexion and stability.  We then went  ahead and resurfaced our patella going from a 22 mm thickness down to a 14 mm thickness, drilling lug holes for the 35 patellar button.  We ranged the knee with the trial button in place and had excellent patellar tracking with no touch technique.  We  then removed all trial components.   Vacuum mixed high viscosity cement on the back table.  I pulse irrigated the bone and then dried the bone well prior to cementing.  We cemented the size 4 tibia, the size 4 narrow left femur, and then placed a 6-mm  poly trial in place and placed the knee in extension to compress the cement.  We then used a patellar clamp to compress the patella, which was the 35 patellar button, cementing all the components all in one step.  Once the cement was hardened, we removed  all excess cement with quarter inch curved osteotomes.  We used the Marcaine and Exparel saline and injected the anterior capsule and the gutters for postop pain control.  We then trialed again with actually a size 4 8 mm poly and felt like that was the  right fit and allowed [place] to get to full extension but had good flexion and stability.  So, we selected the real size 4 8 mm poly and once we had been sure that we had inspected the knee for any additional cement, we placed the poly on the tibia and  reduced the knee.  A nice little pop as the medial epicondyle reduced.  Excellent flexion and extension, stability, and normal patellar tracking.  We irrigated again and closed the parapatellar arthrotomy with #1 Vicryl suture followed by 0 and 2-0  layered subcutaneous closure and running 4-0 Monocryl for skin.  Steri-Strips were applied followed by a sterile dressing.  The patient tolerated the surgery well.    PUS D: 11/13/2023 5:08:26 pm T: 11/13/2023 6:53:00 pm  JOB: [id number]/ [id number]

## 2023-11-13 NOTE — Care Plan (Signed)
Ortho Bundle Case Management Note  Patient Details  Name: [name] MRN: [id number] Date of Birth: September 29, 1946                  L TKA on 11-13-23 DCP: Home with a friend DME: No needs, has a RW PT: [facility] on 11-16-23   DME Arranged:  N/A DME Agency:     HH Arranged:    HH Agency:     Additional Comments: Please contact me with any questions of if this plan should need to change.  Ennis Forts, RN,CCM [facility]  [phone] 11/13/2023, 1:39 PM

## 2023-11-14 DIAGNOSIS — M1712 Unilateral primary osteoarthritis, left knee: Secondary | ICD-10-CM | POA: Diagnosis not present

## 2023-11-14 DIAGNOSIS — Z91040 Latex allergy status: Secondary | ICD-10-CM | POA: Diagnosis not present

## 2023-11-14 DIAGNOSIS — Z96611 Presence of right artificial shoulder joint: Secondary | ICD-10-CM | POA: Diagnosis not present

## 2023-11-14 DIAGNOSIS — Z96651 Presence of right artificial knee joint: Secondary | ICD-10-CM | POA: Diagnosis not present

## 2023-11-14 DIAGNOSIS — Z7901 Long term (current) use of anticoagulants: Secondary | ICD-10-CM | POA: Diagnosis not present

## 2023-11-14 DIAGNOSIS — I10 Essential (primary) hypertension: Secondary | ICD-10-CM | POA: Diagnosis not present

## 2023-11-14 DIAGNOSIS — E039 Hypothyroidism, unspecified: Secondary | ICD-10-CM | POA: Diagnosis not present

## 2023-11-14 DIAGNOSIS — I4891 Unspecified atrial fibrillation: Secondary | ICD-10-CM | POA: Diagnosis not present

## 2023-11-14 DIAGNOSIS — Z79899 Other long term (current) drug therapy: Secondary | ICD-10-CM | POA: Diagnosis not present

## 2023-11-14 DIAGNOSIS — Z96612 Presence of left artificial shoulder joint: Secondary | ICD-10-CM | POA: Diagnosis not present

## 2023-11-14 NOTE — Plan of Care (Signed)
  Problem: Education: Goal: Knowledge of General Education information will improve Description: Including pain rating scale, medication(s)/side effects and non-pharmacologic comfort measures Outcome: Progressing   Problem: Activity: Goal: Risk for activity intolerance will decrease Outcome: Progressing   Problem: Pain Management: Goal: General experience of comfort will improve Outcome: Progressing

## 2023-11-14 NOTE — Progress Notes (Signed)
CODE 44 was delivered by this CSW to patient's room.   [place] Yael Coppess LCSW-A   11/14/2023 3:26 PM

## 2023-11-14 NOTE — Progress Notes (Signed)
Orthopedics Progress Note  Subjective: Patient comfortable, still under the block.   Objective:  Vitals:   11/14/23 0059 11/14/23 0639  BP: 111/61 (!) 113/45  Pulse: 81 63  Resp: 14 16  Temp: 97.6 F (36.4 C) 97.7 F (36.5 C)  SpO2: 93% 95%    General: Awake and alert  Musculoskeletal: Left knee dressing CDI. No pain with calf pumps or quad sets Neurovascularly intact  Lab Results  Component Value Date   WBC 8.0 11/04/2023   HGB 16.4 (H) 11/04/2023   HCT 49.9 (H) 11/04/2023   MCV 93.8 11/04/2023   PLT 298 11/04/2023       Component Value Date/Time   NA 137 11/04/2023 1129   NA 138 12/26/2022 0936   K 4.6 11/04/2023 1129   CL 101 11/04/2023 1129   CO2 27 11/04/2023 1129   GLUCOSE 88 11/04/2023 1129   BUN 15 11/04/2023 1129   BUN 10 12/26/2022 0936   CREATININE 0.84 11/04/2023 1129   CREATININE 0.77 05/30/2019 1417   CALCIUM 10.9 (H) 11/04/2023 1129   GFRNONAA >60 11/04/2023 1129   GFRAA >60 06/30/2020 0255    No results found for: "INR", "PROTIME"  Assessment/Plan: POD #1 s/p Procedure(s): TOTAL KNEE ARTHROPLASTY Stable this AM. Overall pain well controlled. She is tolerating the po Vicodin well Plan is for PT today and tomorrow, with discharge tomorrow after PT. She has steps at home so will need to practice those.  Home with family who will stay with her.  Follow up in two weeks  Almedia Balls. Ranell Patrick, MD 11/14/2023 8:08 AM

## 2023-11-14 NOTE — Plan of Care (Signed)
Problem: Education: Goal: Knowledge of General Education information will improve Description: Including pain rating scale, medication(s)/side effects and non-pharmacologic comfort measures Outcome: Progressing   Problem: Clinical Measurements: Goal: Ability to maintain clinical measurements within normal limits will improve Outcome: Progressing   Problem: Activity: Goal: Risk for activity intolerance will decrease Outcome: Progressing   Problem: Nutrition: Goal: Adequate nutrition will be maintained Outcome: Progressing   Problem: Pain Management: Goal: General experience of comfort will improve Outcome: Progressing   Problem: Safety: Goal: Ability to remain free from injury will improve Outcome: Progressing   Haydee Salter, RN 11/14/23 11:38 AM

## 2023-11-14 NOTE — Care Management CC44 (Signed)
Condition Code 44 Documentation Completed  Patient Details  Name: JANANI LANGWELL MRN: [id number] Date of Birth: [birth year]   Condition Code 44 given:  Yes Patient signature on Condition Code 44 notice:  Yes (VERBAL) Documentation of 2 MD's agreement:  Yes Code 44 added to claim:  Yes    Georgie Chard, LCSW 11/14/2023, 2:43 PM

## 2023-11-14 NOTE — Progress Notes (Signed)
1620 Patient experienced nausea following pain medication. She required two doses of Tramadol to manage her pain and became nauseated with the second dose.   Patient medicated with Zofran and, while nausea improved, reported palpitations. HR noted to be regular and VS stable. PRN Metoprolol utilized as per indication for palpitations. Decreased environmental stimuli and patient resting peacefully afterward.  Haydee Salter, RN 11/14/23 5:56 PM

## 2023-11-14 NOTE — Progress Notes (Signed)
Physical Therapy Treatment Patient Details Name: [name] MRN: [id number] DOB: Mar 24, 1946 Today's Date: 11/14/2023   History of Present Illness 77 yo female s/p L TKA on 11/14/23. PMH: R TKA, L rTSA, anxiety, HTN, afib, R TSA, depression    PT Comments  Pt just back to bed with nursing staff, just had pain meds; agreeable to therapeutic exercise, much improved quad activation compared to am, tol well. Will continue to work on mobility/knee ROM next day.  Pt reports her dtr will be here tomorrow, likely will be ready to d/c from PT standpoint.    If plan is discharge home, recommend the following: A little help with walking and/or transfers;Help with stairs or ramp for entrance;Assistance with cooking/housework;Assist for transportation;A little help with bathing/dressing/bathroom   Can travel by private vehicle        Equipment Recommendations  None recommended by PT    Recommendations for Other Services       Precautions / Restrictions Precautions Precautions: Fall;Knee Required Braces or Orthoses: Knee Immobilizer - Left Restrictions Weight Bearing Restrictions Per Provider Order: No LLE Weight Bearing Per Provider Order: Weight bearing as tolerated     Mobility  Bed Mobility Overal bed mobility: Needs Assistance Bed Mobility: Supine to Sit     Supine to sit: Contact guard     General bed mobility comments: pt just back to bed with nursing    Transfers Overall transfer level: Needs assistance Equipment used: Rolling walker (2 wheels) Transfers: Sit to/from Stand, Bed to chair/wheelchair/BSC Sit to Stand: Min assist Stand pivot transfers: Min assist         General transfer comment: assist to rise and transiton to RW, cues for use of UEs/RW d/t limtied ability to WB on LLE-decr quad activation likely d/t regional nerve block    Ambulation/Gait               General Gait Details: unable d/t L knee buckling with KI in place, deferred for pt and  staff safety   Stairs             Wheelchair Mobility     Tilt Bed    Modified Rankin (Stroke Patients Only)       Balance Overall balance assessment: Needs assistance Sitting-balance support: No upper extremity supported, Feet supported Sitting balance-Leahy Scale: Good     Standing balance support: Reliant on assistive device for balance, During functional activity Standing balance-Leahy Scale: Poor                              Cognition Arousal: Alert Behavior During Therapy: WFL for tasks assessed/performed Overall Cognitive Status: Within Functional Limits for tasks assessed                                          Exercises Total Joint Exercises Ankle Circles/Pumps: AROM, Both, 10 reps Quad Sets: AROM, 10 reps, Both Heel Slides: AAROM, Left, 10 reps Hip ABduction/ADduction: AROM, AAROM, Left, 10 reps Straight Leg Raises: AAROM, Left, 10 reps    General Comments        Pertinent Vitals/Pain Pain Assessment Pain Assessment: Faces Faces Pain Scale: Hurts little more Pain Location: left knee Pain Descriptors / Indicators: Grimacing, Discomfort Pain Intervention(s): Limited activity within patient's tolerance, Monitored during session, Premedicated before session, Ice applied    Home Living  Family/patient expects to be discharged to:: Private residence Living Arrangements: Alone Available Help at Discharge: Family;Available 24 hours/day   Home Access: Stairs to enter Entrance Stairs-Rails: Left Entrance Stairs-Number of Steps: 5   Home Layout: One level Home Equipment: Agricultural consultant (2 wheels);Cane - single point Additional Comments: dtr and then friend staying for 2 wks in total    Prior Function            PT Goals (current goals can now be found in the care plan section) Acute Rehab PT Goals PT Goal Formulation: With patient Time For Goal Achievement: 11/21/23 Potential to Achieve Goals: Good Progress  towards PT goals: Progressing toward goals    Frequency    7X/week      PT Plan      Co-evaluation              AM-PAC PT "6 Clicks" Mobility   Outcome Measure  Help needed turning from your back to your side while in a flat bed without using bedrails?: A Little Help needed moving from lying on your back to sitting on the side of a flat bed without using bedrails?: A Little Help needed moving to and from a bed to a chair (including a wheelchair)?: A Little Help needed standing up from a chair using your arms (e.g., wheelchair or bedside chair)?: A Little Help needed to walk in hospital room?: A Lot Help needed climbing 3-5 steps with a railing? : Total 6 Click Score: 15    End of Session Equipment Utilized During Treatment: Gait belt;Left knee immobilizer Activity Tolerance: Patient tolerated treatment well;Other (comment) (mild nausea) Patient left: in chair;with call bell/phone within reach;with chair alarm set Nurse Communication: Mobility status PT Visit Diagnosis: Other abnormalities of gait and mobility (R26.89)     Time: 1610-9604 PT Time Calculation (min) (ACUTE ONLY): 11 min  Charges:    $Therapeutic Exercise: 8-22 mins PT General Charges $$ ACUTE PT VISIT: 1 Visit                     Irl Bodie, PT  Acute Rehab Dept [facility]) [phone]  11/14/2023    [facility] 11/14/2023, 4:18 PM

## 2023-11-14 NOTE — Plan of Care (Signed)
  Problem: Education: Goal: Knowledge of General Education information will improve Description: Including pain rating scale, medication(s)/side effects and non-pharmacologic comfort measures Outcome: Progressing   Problem: Coping: Goal: Level of anxiety will decrease Outcome: Progressing   Problem: Pain Management: Goal: General experience of comfort will improve Outcome: Progressing   Problem: Pain Management: Goal: Pain level will decrease with appropriate interventions Outcome: Progressing

## 2023-11-14 NOTE — Evaluation (Signed)
Physical Therapy Evaluation Patient Details Name: [name] MRN: [id number] DOB: [birth year] Today's Date: 11/14/2023  History of Present Illness  77 yo female s/p L TKA on 11/14/23. PMH: R TKA, L rTSA, anxiety, HTN, afib, R TSA, depression  Clinical Impression  Pt is s/p TKA resulting in the deficits listed below (see PT Problem List).  Amb limited at this time d/t LLE numbness, decr quad activation likely d/t regional nerve block. Pt reports to PT that her leg was "numb for 3 days" after last TKA. Continue to follow, anticipate steady progress once anesthesia effects fully resolved.  Pt will benefit from acute skilled PT to increase their independence and safety with mobility to allow discharge.          If plan is discharge home, recommend the following: A little help with walking and/or transfers;Help with stairs or ramp for entrance;Assistance with cooking/housework;Assist for transportation;A little help with bathing/dressing/bathroom   Can travel by private vehicle        Equipment Recommendations None recommended by PT  Recommendations for Other Services       Functional Status Assessment Patient has had a recent decline in their functional status and demonstrates the ability to make significant improvements in function in a reasonable and predictable amount of time.     Precautions / Restrictions Precautions Precautions: Fall;Knee Restrictions Weight Bearing Restrictions Per Provider Order: No LLE Weight Bearing Per Provider Order: Weight bearing as tolerated      Mobility  Bed Mobility Overal bed mobility: Needs Assistance Bed Mobility: Supine to Sit     Supine to sit: Contact guard     General bed mobility comments: for safety    Transfers Overall transfer level: Needs assistance Equipment used: Rolling walker (2 wheels) Transfers: Sit to/from Stand, Bed to chair/wheelchair/BSC Sit to Stand: Min assist Stand pivot transfers: Min assist          General transfer comment: assist to rise and transiton to RW, cues for use of UEs/RW d/t limtied ability to WB on LLE-decr quad activation likely d/t regional nerve block    Ambulation/Gait               General Gait Details: unable d/t L knee buckling with KI in place, deferred for pt and staff safety  Stairs            Wheelchair Mobility     Tilt Bed    Modified Rankin (Stroke Patients Only)       Balance Overall balance assessment: Needs assistance Sitting-balance support: No upper extremity supported, Feet supported Sitting balance-Leahy Scale: Good     Standing balance support: Reliant on assistive device for balance, During functional activity Standing balance-Leahy Scale: Poor                               Pertinent Vitals/Pain Pain Assessment Pain Assessment: No/denies pain    Home Living Family/patient expects to be discharged to:: Private residence Living Arrangements: Alone Available Help at Discharge: Family;Available 24 hours/day   Home Access: Stairs to enter Entrance Stairs-Rails: Left Entrance Stairs-Number of Steps: 5   Home Layout: One level Home Equipment: Agricultural consultant (2 wheels);Cane - single point Additional Comments: dtr and then friend staying for 2 wks in total    Prior Function Prior Level of Function : Independent/Modified Independent  Extremity/Trunk Assessment   Upper Extremity Assessment Upper Extremity Assessment: Overall WFL for tasks assessed    Lower Extremity Assessment Lower Extremity Assessment: LLE deficits/detail LLE Deficits / Details: ankle AROM WFL, knee extension 2/5, SLR with 20 degree quad lag and diminished sensation throughout likely d/t regional block LLE Sensation: decreased light touch LLE Coordination: decreased gross motor;decreased fine motor       Communication      Cognition Arousal: Alert Behavior During Therapy: WFL for tasks  assessed/performed Overall Cognitive Status: Within Functional Limits for tasks assessed                                          General Comments      Exercises Total Joint Exercises Ankle Circles/Pumps: AROM, Both, 5 reps   Assessment/Plan    PT Assessment Patient needs continued PT services  PT Problem List Decreased strength;Decreased range of motion;Decreased activity tolerance;Decreased balance;Decreased mobility;Decreased knowledge of precautions;Decreased knowledge of use of DME;Impaired sensation       PT Treatment Interventions DME instruction;Gait training;Functional mobility training;Therapeutic activities;Therapeutic exercise;Patient/family education;Stair training    PT Goals (Current goals can be found in the Care Plan section)  Acute Rehab PT Goals PT Goal Formulation: With patient Time For Goal Achievement: 11/21/23 Potential to Achieve Goals: Good    Frequency 7X/week     Co-evaluation               AM-PAC PT "6 Clicks" Mobility  Outcome Measure Help needed turning from your back to your side while in a flat bed without using bedrails?: A Little Help needed moving from lying on your back to sitting on the side of a flat bed without using bedrails?: A Little Help needed moving to and from a bed to a chair (including a wheelchair)?: A Lot Help needed standing up from a chair using your arms (e.g., wheelchair or bedside chair)?: A Lot Help needed to walk in hospital room?: Total Help needed climbing 3-5 steps with a railing? : Total 6 Click Score: 12    End of Session Equipment Utilized During Treatment: Gait belt;Left knee immobilizer Activity Tolerance: Patient tolerated treatment well;Other (comment) (ltd by LLE numbness, effects of regional nerve block) Patient left: in chair;with call bell/phone within reach;with chair alarm set Nurse Communication: Mobility status PT Visit Diagnosis: Other abnormalities of gait and mobility  (R26.89)    Time: 2956-2130 PT Time Calculation (min) (ACUTE ONLY): 18 min   Charges:   PT Evaluation $PT Eval Low Complexity: 1 Low   PT General Charges $$ ACUTE PT VISIT: 1 Visit         Miette Molenda, PT  Acute Rehab Dept ([facility]) [phone]  11/14/2023   [facility] 11/14/2023, 1:32 PM

## 2023-11-15 DIAGNOSIS — Z7901 Long term (current) use of anticoagulants: Secondary | ICD-10-CM | POA: Diagnosis not present

## 2023-11-15 DIAGNOSIS — E039 Hypothyroidism, unspecified: Secondary | ICD-10-CM | POA: Diagnosis not present

## 2023-11-15 DIAGNOSIS — Z96612 Presence of left artificial shoulder joint: Secondary | ICD-10-CM | POA: Diagnosis not present

## 2023-11-15 DIAGNOSIS — Z96611 Presence of right artificial shoulder joint: Secondary | ICD-10-CM | POA: Diagnosis not present

## 2023-11-15 DIAGNOSIS — M1712 Unilateral primary osteoarthritis, left knee: Secondary | ICD-10-CM | POA: Diagnosis not present

## 2023-11-15 DIAGNOSIS — Z91040 Latex allergy status: Secondary | ICD-10-CM | POA: Diagnosis not present

## 2023-11-15 DIAGNOSIS — I10 Essential (primary) hypertension: Secondary | ICD-10-CM | POA: Diagnosis not present

## 2023-11-15 DIAGNOSIS — Z96651 Presence of right artificial knee joint: Secondary | ICD-10-CM | POA: Diagnosis not present

## 2023-11-15 DIAGNOSIS — Z79899 Other long term (current) drug therapy: Secondary | ICD-10-CM | POA: Diagnosis not present

## 2023-11-15 DIAGNOSIS — I4891 Unspecified atrial fibrillation: Secondary | ICD-10-CM | POA: Diagnosis not present

## 2023-11-15 MED ORDER — PROMETHAZINE HCL 25 MG PO TABS
12.5000 mg | ORAL_TABLET | Freq: Four times a day (QID) | ORAL | Status: DC | PRN
  Administered 2023-11-16 – 2023-11-17 (×4): 12.5 mg via ORAL
  Filled 2023-11-15 (×4): qty 1

## 2023-11-15 NOTE — Progress Notes (Signed)
Physical Therapy Treatment Patient Details Name: [name] MRN: [id number] DOB: 03/30/1946 Today's Date: 11/15/2023   History of Present Illness 77 yo female s/p L TKA on 11/14/23. PMH: R TKA, L rTSA, anxiety, HTN, afib, R TSA, depression    PT Comments  Pt progressing steadily but slowly.  requires incr time to complete basic tasks and gait is limited by pain.  Pt expresses that she is concerned about d/c today and does not feel she is ready. Will see how she progresses this pm, may need another day to meet PT goals and safely d/c home    If plan is discharge home, recommend the following: A little help with walking and/or transfers;Help with stairs or ramp for entrance;Assistance with cooking/housework;Assist for transportation;A little help with bathing/dressing/bathroom   Can travel by private vehicle        Equipment Recommendations  None recommended by PT    Recommendations for Other Services       Precautions / Restrictions Precautions Precautions: Fall;Knee Restrictions LLE Weight Bearing Per Provider Order: Weight bearing as tolerated     Mobility  Bed Mobility Overal bed mobility: Needs Assistance Bed Mobility: Supine to Sit     Supine to sit: Contact guard     General bed mobility comments: incr time and effort pt able to complete without physical assist. cues to assist self, use gait belt as leg lifter    Transfers Overall transfer level: Needs assistance Equipment used: Rolling walker (2 wheels) Transfers: Sit to/from Stand Sit to Stand: Min assist           General transfer comment: verbal cues for hand placement and to power up with RLE. 2 attempts and  incr time needed    Ambulation/Gait Ambulation/Gait assistance: Min assist Gait Distance (Feet): 20 Feet Assistive device: Rolling walker (2 wheels) Gait Pattern/deviations: Step-to pattern, Antalgic, Decreased step length - right, Decreased step length - left       General Gait  Details: cues for sequence, RW position and use of UEs to off load LLE d/t pain with WBing   Stairs             Wheelchair Mobility     Tilt Bed    Modified Rankin (Stroke Patients Only)       Balance           Standing balance support: Reliant on assistive device for balance, During functional activity Standing balance-Leahy Scale: Poor                              Cognition Arousal: Alert Behavior During Therapy: WFL for tasks assessed/performed Overall Cognitive Status: Within Functional Limits for tasks assessed                                          Exercises      General Comments        Pertinent Vitals/Pain Pain Assessment Pain Assessment: Faces Faces Pain Scale: Hurts whole lot Pain Location: left knee Pain Descriptors / Indicators: Grimacing, Discomfort Pain Intervention(s): Limited activity within patient's tolerance, Monitored during session, Premedicated before session, Repositioned, Ice applied    Home Living                          Prior Function  PT Goals (current goals can now be found in the care plan section) Acute Rehab PT Goals PT Goal Formulation: With patient Time For Goal Achievement: 11/21/23 Potential to Achieve Goals: Good Progress towards PT goals: Progressing toward goals    Frequency    7X/week      PT Plan      Co-evaluation              AM-PAC PT "6 Clicks" Mobility   Outcome Measure  Help needed turning from your back to your side while in a flat bed without using bedrails?: A Little Help needed moving from lying on your back to sitting on the side of a flat bed without using bedrails?: A Little Help needed moving to and from a bed to a chair (including a wheelchair)?: A Little Help needed standing up from a chair using your arms (e.g., wheelchair or bedside chair)?: A Little Help needed to walk in hospital room?: A Little Help needed  climbing 3-5 steps with a railing? : A Lot 6 Click Score: 17    End of Session Equipment Utilized During Treatment: Gait belt;Left knee immobilizer Activity Tolerance: Patient limited by pain Patient left: in chair;with call bell/phone within reach;with chair alarm set Nurse Communication: Mobility status PT Visit Diagnosis: Other abnormalities of gait and mobility (R26.89)     Time: 9562-1308 PT Time Calculation (min) (ACUTE ONLY): 24 min  Charges:    $Gait Training: 23-37 mins PT General Charges $$ ACUTE PT VISIT: 1 Visit                     Winnie Umali, PT  Acute Rehab Dept [facility]) [phone]  11/15/2023    [facility] 11/15/2023, 1:11 PM

## 2023-11-15 NOTE — Care Management Obs Status (Signed)
MEDICARE OBSERVATION STATUS NOTIFICATION   Patient Details  Name: [name] MRN: [id number] Date of Birth: 1946/10/29   Medicare Observation Status Notification Given:  Elease Etienne, LCSW 11/15/2023, 11:57 AM

## 2023-11-15 NOTE — Care Management Important Message (Signed)
Important Message  Patient Details  Name: [name] MRN: [id number] Date of Birth: Jan 03, 1946   Important Message Given:  Yes - Medicare IM     Georgie Chard, LCSW 11/15/2023, 11:56 AM

## 2023-11-15 NOTE — TOC Transition Note (Signed)
Transition of Care [facility]) - Discharge Note   Patient Details  Name: [name] MRN: [id number] Date of Birth: Jul 12, 1946  Transition of Care [facility]) CM/SW Contact:  Howell Rucks, RN Phone Number: 11/15/2023, 11:49 AM   Clinical Narrative: Met with pt at bedside, DC order to home. Pt confirmed she has Outpatient PT scheduled with first visit tomorrow, 12/23, report she has a RW, confirmed has transportation available. No further TOC needs identified at this time.       Final next level of care: OP Rehab Barriers to Discharge: Barriers Resolved   Patient Goals and CMS Choice Patient states their goals for this hospitalization and ongoing recovery are:: return home with outpatient PT          Discharge Placement                       Discharge Plan and Services Additional resources added to the After Visit Summary for                  DME Arranged: N/A                    Social Drivers of Health (SDOH) Interventions SDOH Screenings   Food Insecurity: No Food Insecurity (11/13/2023)  Housing: Low Risk  (11/13/2023)  Transportation Needs: No Transportation Needs (11/13/2023)  Utilities: Not At Risk (11/13/2023)  Tobacco Use: Low Risk  (11/13/2023)     Readmission Risk Interventions    11/15/2023   11:48 AM  Readmission Risk Prevention Plan  Post Dischage Appt Complete  Medication Screening Complete  Transportation Screening Complete

## 2023-11-15 NOTE — Progress Notes (Signed)
   Subjective: 2 Days Post-Op Procedure(s) (LRB): TOTAL KNEE ARTHROPLASTY (Left) Patient seen in rounds for Dr. Ranell Patrick. Patient reports continued nausea this AM. Feels it will improve with antiemetics but then returns. She also states her pain is a bit worse today. Fairly comfortable at rest but increases with any weightbearing activity. She is hesitant to discharge home today in current condition.  Objective: Vital signs in last 24 hours: Temp:  [97.8 F (36.6 C)-98.8 F (37.1 C)] 97.8 F (36.6 C) (12/22 0604) Pulse Rate:  [60-70] 70 (12/22 0604) Resp:  [14-20] 17 (12/22 0604) BP: (112-155)/(57-89) 155/74 (12/22 0604) SpO2:  [94 %-99 %] 99 % (12/22 0604)  Intake/Output from previous day:  Intake/Output Summary (Last 24 hours) at 11/15/2023 0809 Last data filed at 11/15/2023 0634 Gross per 24 hour  Intake 280 ml  Output 401 ml  Net -121 ml    Intake/Output this shift: No intake/output data recorded.  Labs: No results for input(s): "HGB" in the last 72 hours. No results for input(s): "WBC", "RBC", "HCT", "PLT" in the last 72 hours. No results for input(s): "NA", "K", "CL", "CO2", "BUN", "CREATININE", "GLUCOSE", "CALCIUM" in the last 72 hours. No results for input(s): "LABPT", "INR" in the last 72 hours.  Exam: General - Patient is Alert and Oriented Extremity - Neurologically intact Neurovascular intact Sensation intact distally Dorsiflexion/Plantar flexion intact Dressing/Incision - clean, dry, no drainage Motor Function - intact, moving foot and toes well on exam.  Past Medical History:  Diagnosis Date   Anxiety    Arthritis    Atrial fibrillation [facility])    Documented December 2019   Complication of anesthesia    slow to wake up   Depression    GERD (gastroesophageal reflux disease)    Heart murmur    Hyperlipidemia    Hypertension    Hypothyroidism    PONV (postoperative nausea and vomiting)     Assessment/Plan: 2 Days Post-Op Procedure(s) (LRB): TOTAL  KNEE ARTHROPLASTY (Left) Principal Problem:   H/O total knee replacement, left  Estimated body mass index is 31.46 kg/m as calculated from the following:   Height as of this encounter: 5\' 2"  (1.575 m).   Weight as of this encounter: 78 kg.  DVT Prophylaxis -  Eliquis Weight-bearing as tolerated.  Continue physical therapy today. Possible discharge home pending progress with PT and if symptoms well managed.  Alfonzo Feller, PA-C Orthopedic Surgery 11/15/2023, 8:09 AM

## 2023-11-15 NOTE — Progress Notes (Signed)
PT TX NOTE   11/15/23 1400  PT Visit Information  Last PT Received On 11/15/23  Assistance Needed Pt making slow but steady progress, continues to have some issues with pain control during mobility. Pain and fatigue limiting gait distance to 30' however this was improved from am session. Pt has 5 steps to enter home. Pt will benefit from another day to work with PT for safe d/c, RN aware  History of Present Illness 77 yo female s/p L TKA on 11/14/23. PMH: R TKA, L rTSA, anxiety, HTN, afib, R TSA, depression  Precautions  Precautions Fall;Knee  Restrictions  LLE Weight Bearing Per Provider Order WBAT  Pain Assessment  Pain Assessment Faces  Faces Pain Scale 8  Pain Location left knee  Pain Descriptors / Indicators Grimacing;Discomfort  Pain Intervention(s) Limited activity within patient's tolerance;Monitored during session;Premedicated before session;Repositioned;Ice applied  Cognition  Arousal Alert  Behavior During Therapy WFL for tasks assessed/performed  Overall Cognitive Status Within Functional Limits for tasks assessed  Bed Mobility  Overal bed mobility Needs Assistance  Bed Mobility Sit to Supine  Sit to supine Min assist  General bed mobility comments incr time and effort. assist with LLE and cues to self assist  Transfers  Overall transfer level Needs assistance  Equipment used Rolling walker (2 wheels)  Transfers Sit to/from Stand  Sit to Stand Min assist  General transfer comment verbal cues for hand placement and to power up with RLE.  Ambulation/Gait  Ambulation/Gait assistance Min assist  Gait Distance (Feet) 30 Feet  Assistive device Rolling walker (2 wheels)  Gait Pattern/deviations Step-to pattern;Antalgic;Decreased step length - right;Decreased step length - left  General Gait Details cues for sequence, RW position and use of UEs to off load LLE d/t pain with WBing  Balance  Standing balance support Reliant on assistive device for balance;During functional  activity  Standing balance-Leahy Scale Poor  Total Joint Exercises  Ankle Circles/Pumps AROM;Both;10 reps  Quad Sets AROM;Both;5 reps;Limitations  Quad Sets Limitations pain  Heel Slides AAROM;Left;10 reps  Straight Leg Raises AAROM;Left;10 reps  PT - End of Session  Equipment Utilized During Treatment Gait belt;Left knee immobilizer  Activity Tolerance Patient limited by fatigue;Patient limited by pain  Patient left in chair;with call bell/phone within reach;with chair alarm set  Nurse Communication Mobility status   PT - Assessment/Plan  PT Visit Diagnosis Other abnormalities of gait and mobility (R26.89)  PT Frequency (ACUTE ONLY) 7X/week  Follow Up Recommendations Follow physician's recommendations for discharge plan and follow up therapies  Patient can return home with the following A little help with walking and/or transfers;Help with stairs or ramp for entrance;Assistance with cooking/housework;Assist for transportation;A little help with bathing/dressing/bathroom  PT equipment None recommended by PT  AM-PAC PT "6 Clicks" Mobility Outcome Measure (Version 2)  Help needed turning from your back to your side while in a flat bed without using bedrails? 3  Help needed moving from lying on your back to sitting on the side of a flat bed without using bedrails? 3  Help needed moving to and from a bed to a chair (including a wheelchair)? 3  Help needed standing up from a chair using your arms (e.g., wheelchair or bedside chair)? 3  Help needed to walk in hospital room? 3  Help needed climbing 3-5 steps with a railing?  2  6 Click Score 17  Consider Recommendation of Discharge To: Home with [facility]  PT Goal Progression  Progress towards PT goals Progressing toward goals  Acute  Rehab PT Goals  PT Goal Formulation With patient  Time For Goal Achievement 11/21/23  Potential to Achieve Goals Good  PT Time Calculation  PT Start Time (ACUTE ONLY) 1355  PT Stop Time (ACUTE ONLY) 1423  PT Time  Calculation (min) (ACUTE ONLY) 28 min  PT General Charges  $$ ACUTE PT VISIT 1 Visit  PT Treatments  $Therapeutic Activity 8-22 mins

## 2023-11-16 ENCOUNTER — Encounter (HOSPITAL_COMMUNITY): Payer: Self-pay | Admitting: Orthopedic Surgery

## 2023-11-16 DIAGNOSIS — I4891 Unspecified atrial fibrillation: Secondary | ICD-10-CM | POA: Diagnosis not present

## 2023-11-16 DIAGNOSIS — I10 Essential (primary) hypertension: Secondary | ICD-10-CM | POA: Diagnosis not present

## 2023-11-16 DIAGNOSIS — Z79899 Other long term (current) drug therapy: Secondary | ICD-10-CM | POA: Diagnosis not present

## 2023-11-16 DIAGNOSIS — Z91040 Latex allergy status: Secondary | ICD-10-CM | POA: Diagnosis not present

## 2023-11-16 DIAGNOSIS — Z7901 Long term (current) use of anticoagulants: Secondary | ICD-10-CM | POA: Diagnosis not present

## 2023-11-16 DIAGNOSIS — Z96612 Presence of left artificial shoulder joint: Secondary | ICD-10-CM | POA: Diagnosis not present

## 2023-11-16 DIAGNOSIS — Z96651 Presence of right artificial knee joint: Secondary | ICD-10-CM | POA: Diagnosis not present

## 2023-11-16 DIAGNOSIS — M1712 Unilateral primary osteoarthritis, left knee: Secondary | ICD-10-CM | POA: Diagnosis not present

## 2023-11-16 DIAGNOSIS — Z96611 Presence of right artificial shoulder joint: Secondary | ICD-10-CM | POA: Diagnosis not present

## 2023-11-16 DIAGNOSIS — E039 Hypothyroidism, unspecified: Secondary | ICD-10-CM | POA: Diagnosis not present

## 2023-11-16 NOTE — Progress Notes (Signed)
Physical Therapy Treatment Patient Details Name: [name] MRN: [id number] DOB: Jun 13, 1946 Today's Date: 11/16/2023   History of Present Illness 77 yo female s/p L TKA on 11/14/23. PMH: R TKA, L rTSA, anxiety, HTN, afib, R TSA, depression    PT Comments  Pt is making slow progress with mobility, she ambulated 13' with RW, distance limited by 9/10 pain, pt had pain medication 1 hour prior to PT session. Pt requires ongoing verbal cues for safe positioning in RW. Pt performed TKA HEP with min assist and verbal cues for technique. She is not likely to be ready to DC home today.     If plan is discharge home, recommend the following: A little help with walking and/or transfers;Help with stairs or ramp for entrance;Assistance with cooking/housework;Assist for transportation;A little help with bathing/dressing/bathroom   Can travel by private vehicle        Equipment Recommendations  None recommended by PT    Recommendations for Other Services       Precautions / Restrictions Precautions Precautions: Fall;Knee Precaution Booklet Issued: Yes (comment) Precaution Comments: reviewed no pillow under knee Restrictions Weight Bearing Restrictions Per Provider Order: No LLE Weight Bearing Per Provider Order: Weight bearing as tolerated     Mobility  Bed Mobility Overal bed mobility: Needs Assistance Bed Mobility: Supine to Sit     Supine to sit: Mod assist, HOB elevated     General bed mobility comments: incr time and effort. assist with LLE and to raise trunk, and cues to self assist; gait belt used as LLE lifter    Transfers Overall transfer level: Needs assistance Equipment used: Rolling walker (2 wheels) Transfers: Sit to/from Stand Sit to Stand: Contact guard assist           General transfer comment: verbal cues for hand placement    Ambulation/Gait Ambulation/Gait assistance: Contact guard assist Gait Distance (Feet): 25 Feet Assistive device: Rolling walker  (2 wheels) Gait Pattern/deviations: Step-to pattern, Antalgic, Decreased step length - right, Decreased step length - left Gait velocity: decr     General Gait Details: ongoing VCs for safe positioning in RW and to offload LLE with use of UEs; 9/10 pain limited distance, pt medicated with pain medicine 1 hour prior to this session   Stairs             Wheelchair Mobility     Tilt Bed    Modified Rankin (Stroke Patients Only)       Balance Overall balance assessment: Needs assistance Sitting-balance support: No upper extremity supported, Feet supported Sitting balance-Leahy Scale: Good     Standing balance support: Reliant on assistive device for balance, During functional activity Standing balance-Leahy Scale: Poor                              Cognition Arousal: Alert Behavior During Therapy: WFL for tasks assessed/performed Overall Cognitive Status: Within Functional Limits for tasks assessed                                          Exercises Total Joint Exercises Ankle Circles/Pumps: AROM, Both, 15 reps, Supine Quad Sets: AROM, Both, 5 reps, Supine Short Arc Quad: AAROM, Left, 10 reps, Supine Heel Slides: AAROM, Left, 10 reps, Supine Long Arc Quad: AAROM, Left, 5 reps, Seated Knee Flexion: AAROM, Left, 10 reps, Seated  General Comments        Pertinent Vitals/Pain Pain Assessment Pain Assessment: 0-10 Pain Score: 9  Pain Location: left knee with walking Pain Descriptors / Indicators: Sore Pain Intervention(s): Limited activity within patient's tolerance, Monitored during session, Premedicated before session, Ice applied, Repositioned    Home Living                          Prior Function            PT Goals (current goals can now be found in the care plan section) Acute Rehab PT Goals PT Goal Formulation: With patient Time For Goal Achievement: 11/21/23 Potential to Achieve Goals: Good Progress  towards PT goals: Progressing toward goals    Frequency    7X/week      PT Plan      Co-evaluation              AM-PAC PT "6 Clicks" Mobility   Outcome Measure  Help needed turning from your back to your side while in a flat bed without using bedrails?: A Little Help needed moving from lying on your back to sitting on the side of a flat bed without using bedrails?: A Lot Help needed moving to and from a bed to a chair (including a wheelchair)?: A Little Help needed standing up from a chair using your arms (e.g., wheelchair or bedside chair)?: A Little Help needed to walk in hospital room?: A Little Help needed climbing 3-5 steps with a railing? : A Lot 6 Click Score: 16    End of Session Equipment Utilized During Treatment: Gait belt Activity Tolerance: Patient limited by pain Patient left: in chair;with call bell/phone within reach;with chair alarm set Nurse Communication: Mobility status PT Visit Diagnosis: Other abnormalities of gait and mobility (R26.89)     Time: 6578-4696 PT Time Calculation (min) (ACUTE ONLY): 27 min  Charges:    $Gait Training: 8-22 mins $Therapeutic Exercise: 8-22 mins PT General Charges $$ ACUTE PT VISIT: 1 Visit                     Tamala Ser PT 11/16/2023  Acute Rehabilitation Services  Office [phone]

## 2023-11-16 NOTE — Anesthesia Postprocedure Evaluation (Signed)
Anesthesia Post Note  Patient: [name]  Procedure(s) Performed: TOTAL KNEE ARTHROPLASTY (Left: Knee)     Patient location during evaluation: PACU Anesthesia Type: Spinal Level of consciousness: awake and alert Pain management: pain level controlled Vital Signs Assessment: post-procedure vital signs reviewed and stable Respiratory status: spontaneous breathing, nonlabored ventilation and respiratory function stable Cardiovascular status: blood pressure returned to baseline Postop Assessment: no apparent nausea or vomiting, spinal receding, no headache and no backache Anesthetic complications: no   No notable events documented.        Shanda Howells

## 2023-11-16 NOTE — Progress Notes (Signed)
Physical Therapy Treatment Patient Details Name: [name] MRN: [id number] DOB: 04-14-1946 Today's Date: 11/16/2023   History of Present Illness 77 yo female s/p L TKA on 11/14/23. PMH: R TKA, L rTSA, anxiety, HTN, afib, R TSA, depression    PT Comments  POD # 3 pm session General Comments: AxO x 3 pleasant and willing.  Had prior TKR on "other" side "but things are way different this time". General transfer comment: verbal cues for hand placement as well as increased time to rise and lower.  Also asissted with a toilet transfer. General Gait Details: required increased time and 25% VC's on safety with turns amb to and from the bathroom.  Reports pain 5/10. Then returned to room to perform some TE's following HEP handout.  Instructed on proper tech, freq as well as use of ICE.   Pt has NOT met her mobility goals to D/C to home today due to gait instability/safety and inability to attempt stairs.    If plan is discharge home, recommend the following: A little help with walking and/or transfers;Help with stairs or ramp for entrance;Assistance with cooking/housework;Assist for transportation;A little help with bathing/dressing/bathroom   Can travel by private vehicle        Equipment Recommendations  None recommended by PT    Recommendations for Other Services       Precautions / Restrictions Precautions Precautions: Fall;Knee Precaution Comments: reviewed no pillow under knee Restrictions Weight Bearing Restrictions Per Provider Order: No LLE Weight Bearing Per Provider Order: Weight bearing as tolerated     Mobility  Bed Mobility               General bed mobility comments: OOB in recliner    Transfers Overall transfer level: Needs assistance Equipment used: Rolling walker (2 wheels) Transfers: Sit to/from Stand Sit to Stand: Contact guard assist           General transfer comment: verbal cues for hand placement as well as increased time to rise and lower.   Also asissted with a toilet transfer.    Ambulation/Gait Ambulation/Gait assistance: Supervision, Contact guard assist Gait Distance (Feet): 24 Feet (12 feet x 2 to and from bathroom) Assistive device: Rolling walker (2 wheels) Gait Pattern/deviations: Step-to pattern, Antalgic, Decreased step length - right, Decreased step length - left Gait velocity: decr     General Gait Details: required increased time and 25% VC's on safety with turns amb to and from the bathroom.  Reports pain 5/10.   Stairs             Wheelchair Mobility     Tilt Bed    Modified Rankin (Stroke Patients Only)       Balance                                            Cognition Arousal: Alert Behavior During Therapy: WFL for tasks assessed/performed Overall Cognitive Status: Within Functional Limits for tasks assessed                                 General Comments: AxO x 3 pleasant and willing.  Had prior TKR on "other" side "but things are way different this time".        Exercises  Total Knee Replacement TE's following HEP handout 10 reps B  LE ankle pumps 05 reps towel squeezes 05 reps knee presses 05 reps heel slides  05 reps SAQ's 05 reps SLR's 05 reps ABD Educated on use of gait belt to assist with TE's Followed by ICE     General Comments        Pertinent Vitals/Pain Pain Assessment Pain Assessment: 0-10 Pain Score: 5  Pain Location: left knee with walking Pain Descriptors / Indicators: Operative site guarding, Grimacing, Tender Pain Intervention(s): Monitored during session, Premedicated before session, Repositioned, Ice applied    Home Living                          Prior Function            PT Goals (current goals can now be found in the care plan section) Progress towards PT goals: Progressing toward goals    Frequency    7X/week      PT Plan      Co-evaluation              AM-PAC PT "6  Clicks" Mobility   Outcome Measure  Help needed turning from your back to your side while in a flat bed without using bedrails?: A Little Help needed moving from lying on your back to sitting on the side of a flat bed without using bedrails?: A Little Help needed moving to and from a bed to a chair (including a wheelchair)?: A Little Help needed standing up from a chair using your arms (e.g., wheelchair or bedside chair)?: A Little Help needed to walk in hospital room?: A Little Help needed climbing 3-5 steps with a railing? : A Lot 6 Click Score: 17    End of Session Equipment Utilized During Treatment: Gait belt Activity Tolerance: Patient limited by fatigue Patient left: in chair;with call bell/phone within reach;with chair alarm set Nurse Communication: Mobility status PT Visit Diagnosis: Other abnormalities of gait and mobility (R26.89)     Time: 1610-9604 PT Time Calculation (min) (ACUTE ONLY): 28 min  Charges:    $Gait Training: 8-22 mins $Therapeutic Exercise: 8-22 mins PT General Charges $$ ACUTE PT VISIT: 1 Visit                     Felecia Shelling  PTA Acute  Rehabilitation Services Office M-F          [phone]

## 2023-11-16 NOTE — Progress Notes (Signed)
   Subjective: 3 Days Post-Op Procedure(s) (LRB): TOTAL KNEE ARTHROPLASTY (Left) Patient reports pain as moderate.with weight bearing   Plan is to go Home after hospital stay.  Objective: Vital signs in last 24 hours: Temp:  [97.4 F (36.3 C)-98.8 F (37.1 C)] 97.4 F (36.3 C) (12/23 0434) Pulse Rate:  [65-84] 67 (12/23 0434) Resp:  [15-16] 15 (12/23 0434) BP: (136-165)/(53-71) 136/69 (12/23 0434) SpO2:  [92 %-95 %] 95 % (12/23 0434)  Intake/Output from previous day:  Intake/Output Summary (Last 24 hours) at 11/16/2023 0713 Last data filed at 11/16/2023 0600 Gross per 24 hour  Intake 700 ml  Output 500 ml  Net 200 ml    Intake/Output this shift: No intake/output data recorded.  Labs: No results for input(s): "HGB" in the last 72 hours. No results for input(s): "WBC", "RBC", "HCT", "PLT" in the last 72 hours. No results for input(s): "NA", "K", "CL", "CO2", "BUN", "CREATININE", "GLUCOSE", "CALCIUM" in the last 72 hours. No results for input(s): "LABPT", "INR" in the last 72 hours.  EXAM General - Patient is Alert, Appropriate, and Oriented Extremity - Neurologically intact Neurovascular intact No cellulitis present Compartment soft Dressing/Incision - clean, dry, no drainage Motor Function - intact, moving foot and toes well on exam.   Past Medical History:  Diagnosis Date   Anxiety    Arthritis    Atrial fibrillation (HCC)    Documented December 2019   Complication of anesthesia    slow to wake up   Depression    GERD (gastroesophageal reflux disease)    Heart murmur    Hyperlipidemia    Hypertension    Hypothyroidism    PONV (postoperative nausea and vomiting)     Assessment/Plan: 3 Days Post-Op Procedure(s) (LRB): TOTAL KNEE ARTHROPLASTY (Left) Principal Problem:   H/O total knee replacement, left   Up with therapy Discharge home today if she is more comfortable and meets goals with therapy   Weight-Bearing as tolerated to left leg  Homero Fellers  Tola Meas 11/16/2023, 7:13 AM

## 2023-11-16 NOTE — Plan of Care (Signed)

## 2023-11-17 DIAGNOSIS — Z96651 Presence of right artificial knee joint: Secondary | ICD-10-CM | POA: Diagnosis not present

## 2023-11-17 DIAGNOSIS — E039 Hypothyroidism, unspecified: Secondary | ICD-10-CM | POA: Diagnosis not present

## 2023-11-17 DIAGNOSIS — Z7901 Long term (current) use of anticoagulants: Secondary | ICD-10-CM | POA: Diagnosis not present

## 2023-11-17 DIAGNOSIS — Z96612 Presence of left artificial shoulder joint: Secondary | ICD-10-CM | POA: Diagnosis not present

## 2023-11-17 DIAGNOSIS — Z91040 Latex allergy status: Secondary | ICD-10-CM | POA: Diagnosis not present

## 2023-11-17 DIAGNOSIS — I10 Essential (primary) hypertension: Secondary | ICD-10-CM | POA: Diagnosis not present

## 2023-11-17 DIAGNOSIS — I4891 Unspecified atrial fibrillation: Secondary | ICD-10-CM | POA: Diagnosis not present

## 2023-11-17 DIAGNOSIS — M1712 Unilateral primary osteoarthritis, left knee: Secondary | ICD-10-CM | POA: Diagnosis not present

## 2023-11-17 DIAGNOSIS — Z96611 Presence of right artificial shoulder joint: Secondary | ICD-10-CM | POA: Diagnosis not present

## 2023-11-17 DIAGNOSIS — Z79899 Other long term (current) drug therapy: Secondary | ICD-10-CM | POA: Diagnosis not present

## 2023-11-17 NOTE — Plan of Care (Signed)
Patient discharged home via private vehicle with family, following SW arranging HHPT as requested by patient. AVS and discharge instruction provided. Patient verbalizes understanding. Haydee Salter, RN 11/17/23 4:23 PM

## 2023-11-17 NOTE — Progress Notes (Signed)
Physical Therapy Treatment Patient Details Name: [name] MRN: [id number] DOB: 03-11-1946 Today's Date: 11/17/2023   History of Present Illness 77 yo female s/p L TKA on 11/14/23. PMH: R TKA, L rTSA, anxiety, HTN, afib, R TSA, depression    PT Comments  POD # 1 pm session with Daughter. Had Daughter "hands on" assist using safety belt with transfers, amb and stair training.  Reviewed HEP following handout.  MD approved for [facility] PT vs OP PT. Addressed all mobility questions, discussed appropriate activity, educated on use of ICE.  Pt ready for D/C to home.    If plan is discharge home, recommend the following: A little help with walking and/or transfers;Help with stairs or ramp for entrance;Assistance with cooking/housework;Assist for transportation;A little help with bathing/dressing/bathroom   Can travel by private vehicle        Equipment Recommendations  None recommended by PT    Recommendations for Other Services       Precautions / Restrictions Precautions Precautions: Fall;Knee Precaution Comments: reviewed no pillow under knee Restrictions Weight Bearing Restrictions Per Provider Order: No LLE Weight Bearing Per Provider Order: Weight bearing as tolerated     Mobility  Bed Mobility Overal bed mobility: Needs Assistance Bed Mobility: Supine to Sit     Supine to sit: Contact guard, Min assist     General bed mobility comments: Pt OOB in recliner    Transfers Overall transfer level: Needs assistance Equipment used: Rolling walker (2 wheels) Transfers: Sit to/from Stand Sit to Stand: Supervision           General transfer comment: verbal cues for hand placement as well as increased time to rise and lower.  VC's safety with turns.    Ambulation/Gait Ambulation/Gait assistance: Supervision Gait Distance (Feet): 38 Feet Assistive device: Rolling walker (2 wheels) Gait Pattern/deviations: Step-to pattern, Antalgic, Decreased step length - right,  Decreased step length - left Gait velocity: decr     General Gait Details: tolerated an increased distance amb in hallway to the stairs.   Stairs Stairs: Yes Stairs assistance: Contact guard assist, Min assist Stair Management: Two rails, Step to pattern, Forwards Number of Stairs: 2 General stair comments: 25% VC's on proper tech and safety up/down 2 steps using B rails.  Daughter "hands on" assisted using safety belt.   Wheelchair Mobility     Tilt Bed    Modified Rankin (Stroke Patients Only)       Balance                                            Cognition Arousal: Alert Behavior During Therapy: WFL for tasks assessed/performed Overall Cognitive Status: Within Functional Limits for tasks assessed                                 General Comments: AxO x 3 pleasant and willing.  Had prior TKR on "other" side "years ago".  Daughter from [place] coming to stay with her till Sunday.        Exercises      General Comments        Pertinent Vitals/Pain Pain Assessment Pain Assessment: Faces Faces Pain Scale: Hurts little more Pain Location: left knee with walking Pain Descriptors / Indicators: Operative site guarding, Grimacing, Tender Pain Intervention(s): Monitored during session, Repositioned, Patient  requesting pain meds-RN notified, Ice applied    Home Living                          Prior Function            PT Goals (current goals can now be found in the care plan section) Progress towards PT goals: Progressing toward goals    Frequency    7X/week      PT Plan      Co-evaluation              AM-PAC PT "6 Clicks" Mobility   Outcome Measure  Help needed turning from your back to your side while in a flat bed without using bedrails?: A Little Help needed moving from lying on your back to sitting on the side of a flat bed without using bedrails?: A Little Help needed moving to and from a  bed to a chair (including a wheelchair)?: A Little Help needed standing up from a chair using your arms (e.g., wheelchair or bedside chair)?: A Little Help needed to walk in hospital room?: A Little Help needed climbing 3-5 steps with a railing? : A Little 6 Click Score: 18    End of Session Equipment Utilized During Treatment: Gait belt Activity Tolerance: Patient tolerated treatment well Patient left: in chair;with call bell/phone within reach;with chair alarm set Nurse Communication: Mobility status PT Visit Diagnosis: Other abnormalities of gait and mobility (R26.89)     Time: 1435-1500 PT Time Calculation (min) (ACUTE ONLY): 25 min  Charges:    $Gait Training: 8-22 mins $Therapeutic Activity: 8-22 mins PT General Charges $$ ACUTE PT VISIT: 1 Visit                     Felecia Shelling  PTA Acute  Rehabilitation Services Office M-F          [phone]

## 2023-11-17 NOTE — Progress Notes (Signed)
   Subjective: 4 Days Post-Op Procedure(s) (LRB): TOTAL KNEE ARTHROPLASTY (Left) Patient seen in rounds by Dr. Lequita Halt for Dr. Ranell Patrick. Patient reports pain as moderate.  Feels it is finally improving. Patient is well, and has had no acute complaints or problems  Objective: Vital signs in last 24 hours: Temp:  [98.1 F (36.7 C)-98.7 F (37.1 C)] 98.7 F (37.1 C) (12/24 0650) Pulse Rate:  [71-74] 71 (12/24 0650) Resp:  [15-16] 16 (12/24 0650) BP: (129-145)/(56-67) 145/67 (12/24 0650) SpO2:  [93 %-98 %] 96 % (12/24 0650)  Intake/Output from previous day:  Intake/Output Summary (Last 24 hours) at 11/17/2023 0831 Last data filed at 11/17/2023 0600 Gross per 24 hour  Intake 690 ml  Output 0 ml  Net 690 ml    Intake/Output this shift: No intake/output data recorded.  Labs: No results for input(s): "HGB" in the last 72 hours. No results for input(s): "WBC", "RBC", "HCT", "PLT" in the last 72 hours. No results for input(s): "NA", "K", "CL", "CO2", "BUN", "CREATININE", "GLUCOSE", "CALCIUM" in the last 72 hours. No results for input(s): "LABPT", "INR" in the last 72 hours.  Exam: General - Patient is Alert and Oriented Extremity - Neurologically intact Neurovascular intact Sensation intact distally Dorsiflexion/Plantar flexion intact Dressing/Incision - clean, dry, no drainage Motor Function - intact, moving foot and toes well on exam.  Past Medical History:  Diagnosis Date   Anxiety    Arthritis    Atrial fibrillation [facility])    Documented December 2019   Complication of anesthesia    slow to wake up   Depression    GERD (gastroesophageal reflux disease)    Heart murmur    Hyperlipidemia    Hypertension    Hypothyroidism    PONV (postoperative nausea and vomiting)     Assessment/Plan: 4 Days Post-Op Procedure(s) (LRB): TOTAL KNEE ARTHROPLASTY (Left) Principal Problem:   H/O total knee replacement, left  Estimated body mass index is 31.46 kg/m as calculated  from the following:   Height as of this encounter: 5\' 2"  (1.575 m).   Weight as of this encounter: 78 kg.  DVT Prophylaxis -  Eliquis Weight-bearing as tolerated.  Continue physical therapy today. Likely discharge home this afternoon if meeting patient goals.  Alfonzo Feller, PA-C Orthopedic Surgery 11/17/2023, 8:31 AM

## 2023-11-17 NOTE — Progress Notes (Signed)
Physical Therapy Treatment Patient Details Name: [name] MRN: [id number] DOB: Jun 02, 1946 Today's Date: 11/17/2023   History of Present Illness 77 yo female s/p L TKA on 11/14/23. PMH: R TKA, L rTSA, anxiety, HTN, afib, R TSA, depression    PT Comments  POD # 1 am session General Comments: AxO x 3 pleasant and willing.  Had prior TKR on "other" side "years ago".  Daughter from [place] coming to stay with her till Sunday. Assisted OOB to amb in hallway and practice stairs.  General bed mobility comments: demonstarted and instructed pt how to use belt to self assist LE. General transfer comment: verbal cues for hand placement as well as increased time to rise and lower.  VC's safety with turns. General Gait Details: tolerated an increased distance amb in hallway to the stairs.General stair comments: 25% VC's on proper tech and safety up/down 2 steps using B rails.  Returned to room in recliner.  Will need another PT session with daughter present for [facility] Education. Pt plans to D/C to home later today.   If plan is discharge home, recommend the following: A little help with walking and/or transfers;Help with stairs or ramp for entrance;Assistance with cooking/housework;Assist for transportation;A little help with bathing/dressing/bathroom   Can travel by private vehicle        Equipment Recommendations  None recommended by PT    Recommendations for Other Services       Precautions / Restrictions Precautions Precautions: Fall;Knee Precaution Comments: reviewed no pillow under knee Restrictions Weight Bearing Restrictions Per Provider Order: No LLE Weight Bearing Per Provider Order: Weight bearing as tolerated     Mobility  Bed Mobility Overal bed mobility: Needs Assistance Bed Mobility: Supine to Sit     Supine to sit: Contact guard, Min assist     General bed mobility comments: demonstarted and instructed pt how to use belt to self assist LE.     Transfers Overall transfer level: Needs assistance Equipment used: Rolling walker (2 wheels) Transfers: Sit to/from Stand Sit to Stand: Supervision, Contact guard assist           General transfer comment: verbal cues for hand placement as well as increased time to rise and lower.  VC's safety with turns.    Ambulation/Gait Ambulation/Gait assistance: Supervision, Contact guard assist Gait Distance (Feet): 32 Feet Assistive device: Rolling walker (2 wheels) Gait Pattern/deviations: Step-to pattern, Antalgic, Decreased step length - right, Decreased step length - left Gait velocity: decr     General Gait Details: tolerated an increased distance amb in hallway to the stairs.   Stairs Stairs: Yes Stairs assistance: Contact guard assist, Min assist Stair Management: Two rails, Step to pattern, Forwards Number of Stairs: 2 General stair comments: 25% VC's on proper tech and safety up/down 2 steps using B rails.   Wheelchair Mobility     Tilt Bed    Modified Rankin (Stroke Patients Only)       Balance                                            Cognition Arousal: Alert Behavior During Therapy: WFL for tasks assessed/performed Overall Cognitive Status: Within Functional Limits for tasks assessed  General Comments: AxO x 3 pleasant and willing.  Had prior TKR on "other" side "years ago".  Daughter from [place] coming to stay with her till Sunday.        Exercises      General Comments        Pertinent Vitals/Pain Pain Assessment Pain Assessment: Faces Faces Pain Scale: Hurts little more Pain Location: left knee with walking Pain Descriptors / Indicators: Operative site guarding, Grimacing, Tender Pain Intervention(s): Monitored during session, Repositioned, Patient requesting pain meds-RN notified, Ice applied    Home Living                          Prior Function             PT Goals (current goals can now be found in the care plan section) Progress towards PT goals: Progressing toward goals    Frequency    7X/week      PT Plan      Co-evaluation              AM-PAC PT "6 Clicks" Mobility   Outcome Measure  Help needed turning from your back to your side while in a flat bed without using bedrails?: A Little Help needed moving from lying on your back to sitting on the side of a flat bed without using bedrails?: A Little Help needed moving to and from a bed to a chair (including a wheelchair)?: A Little Help needed standing up from a chair using your arms (e.g., wheelchair or bedside chair)?: A Little Help needed to walk in hospital room?: A Little Help needed climbing 3-5 steps with a railing? : A Little 6 Click Score: 18    End of Session Equipment Utilized During Treatment: Gait belt Activity Tolerance: Patient tolerated treatment well Patient left: in chair;with call bell/phone within reach;with chair alarm set Nurse Communication: Mobility status PT Visit Diagnosis: Other abnormalities of gait and mobility (R26.89)     Time: 0950-1007 PT Time Calculation (min) (ACUTE ONLY): 17 min  Charges:    $Gait Training: 8-22 mins PT General Charges $$ ACUTE PT VISIT: 1 Visit                     Joie Hipps  PTA Acute  Rehabilitation Services Office M-F          [phone]

## 2023-11-17 NOTE — TOC Transition Note (Addendum)
Transition of Care [facility]) - Discharge Note   Patient Details  Name: [name] MRN: [id number] Date of Birth: May 15, 1946  Transition of Care [facility]) CM/SW Contact:  Darleene Cleaver, LCSW Phone Number: 11/17/2023, 1:32 PM   Clinical Narrative:     CSW was informed that patient was originally set up for outpatient therapy.  She would like HH PT now.  Bedside nurse contacted attending physician, and he ordered [facility] services for patient.  Patient did not have a preference for an agency.  CSW called [facility], [facility], [facility], [facility], [facility], and [facility], none of them are able to accept patient.  CSW spoke to [place] at [place] and they can accept her.  Patient will be going home with home health through [facility].  TOC signing off please reconsult with any other TOC needs, home health agency has been notified of planned discharge.     Final next level of care: Home w Home Health Services Barriers to Discharge: Barriers Resolved   Patient Goals and CMS Choice Patient states their goals for this hospitalization and ongoing recovery are:: To return back home CMS Medicare.gov Compare Post Acute Care list provided to:: Patient Choice offered to / list presented to : Patient [place] ownership interest in [facility].provided to:: Patient    Discharge Placement    Home with home health PT.       Patient and family notified of of transfer: 11/17/23  Discharge Plan and Services Additional resources added to the After Visit Summary for                  DME Arranged: Walker rolling DME Agency: [facility] Date DME Agency Contacted: 11/17/23 Time DME Agency Contacted: 1331 Representative spoke with at DME Agency: Pre arranged at MD office HH Arranged: PT HH Agency: [facility] Date [facility] Agency Contacted: 11/17/23 Time HH Agency Contacted: [phone] Representative spoke with at [facility] Agency: Elnita Maxwell  Social Drivers of Health (SDOH) Interventions SDOH  Screenings   Food Insecurity: No Food Insecurity (11/13/2023)  Housing: Low Risk  (11/13/2023)  Transportation Needs: No Transportation Needs (11/13/2023)  Utilities: Not At Risk (11/13/2023)  Tobacco Use: Low Risk  (11/13/2023)     Readmission Risk Interventions    11/15/2023   11:48 AM  Readmission Risk Prevention Plan  Post Dischage Appt Complete  Medication Screening Complete  Transportation Screening Complete

## 2023-11-20 DIAGNOSIS — M25562 Pain in left knee: Secondary | ICD-10-CM | POA: Diagnosis not present

## 2023-11-20 DIAGNOSIS — M6281 Muscle weakness (generalized): Secondary | ICD-10-CM | POA: Diagnosis not present

## 2023-11-20 DIAGNOSIS — Z471 Aftercare following joint replacement surgery: Secondary | ICD-10-CM | POA: Diagnosis not present

## 2023-11-22 DIAGNOSIS — M6281 Muscle weakness (generalized): Secondary | ICD-10-CM | POA: Diagnosis not present

## 2023-11-22 DIAGNOSIS — Z471 Aftercare following joint replacement surgery: Secondary | ICD-10-CM | POA: Diagnosis not present

## 2023-11-22 DIAGNOSIS — M25562 Pain in left knee: Secondary | ICD-10-CM | POA: Diagnosis not present

## 2023-11-24 DIAGNOSIS — Z471 Aftercare following joint replacement surgery: Secondary | ICD-10-CM | POA: Diagnosis not present

## 2023-11-24 DIAGNOSIS — M25562 Pain in left knee: Secondary | ICD-10-CM | POA: Diagnosis not present

## 2023-11-24 DIAGNOSIS — M6281 Muscle weakness (generalized): Secondary | ICD-10-CM | POA: Diagnosis not present

## 2023-11-27 DIAGNOSIS — Z471 Aftercare following joint replacement surgery: Secondary | ICD-10-CM | POA: Diagnosis not present

## 2023-11-27 DIAGNOSIS — M6281 Muscle weakness (generalized): Secondary | ICD-10-CM | POA: Diagnosis not present

## 2023-11-27 DIAGNOSIS — M25562 Pain in left knee: Secondary | ICD-10-CM | POA: Diagnosis not present

## 2023-11-29 DIAGNOSIS — Z471 Aftercare following joint replacement surgery: Secondary | ICD-10-CM | POA: Diagnosis not present

## 2023-11-29 DIAGNOSIS — M6281 Muscle weakness (generalized): Secondary | ICD-10-CM | POA: Diagnosis not present

## 2023-11-29 DIAGNOSIS — M25562 Pain in left knee: Secondary | ICD-10-CM | POA: Diagnosis not present

## 2023-12-01 DIAGNOSIS — Z4789 Encounter for other orthopedic aftercare: Secondary | ICD-10-CM | POA: Diagnosis not present

## 2023-12-02 ENCOUNTER — Ambulatory Visit: Payer: Medicare Other | Attending: Cardiology | Admitting: Cardiology

## 2023-12-02 ENCOUNTER — Encounter: Payer: Self-pay | Admitting: *Deleted

## 2023-12-02 ENCOUNTER — Encounter: Payer: Self-pay | Admitting: Cardiology

## 2023-12-02 ENCOUNTER — Telehealth: Payer: Self-pay | Admitting: Cardiology

## 2023-12-02 VITALS — BP 128/82 | HR 77 | Ht 62.0 in

## 2023-12-02 DIAGNOSIS — R0602 Shortness of breath: Secondary | ICD-10-CM | POA: Diagnosis not present

## 2023-12-02 DIAGNOSIS — M25562 Pain in left knee: Secondary | ICD-10-CM | POA: Diagnosis not present

## 2023-12-02 DIAGNOSIS — Z471 Aftercare following joint replacement surgery: Secondary | ICD-10-CM | POA: Diagnosis not present

## 2023-12-02 DIAGNOSIS — I480 Paroxysmal atrial fibrillation: Secondary | ICD-10-CM

## 2023-12-02 DIAGNOSIS — I10 Essential (primary) hypertension: Secondary | ICD-10-CM | POA: Diagnosis not present

## 2023-12-02 DIAGNOSIS — M6281 Muscle weakness (generalized): Secondary | ICD-10-CM | POA: Diagnosis not present

## 2023-12-02 DIAGNOSIS — E782 Mixed hyperlipidemia: Secondary | ICD-10-CM | POA: Diagnosis not present

## 2023-12-02 MED ORDER — ROSUVASTATIN CALCIUM 5 MG PO TABS: 5.0000 mg | ORAL_TABLET | Freq: Every day | ORAL | 1 refills | Status: DC

## 2023-12-02 NOTE — Telephone Encounter (Signed)
 Patient's daughter called to speak to nurse about her mother's appt that she had this morning.  She was calling to get an update.

## 2023-12-02 NOTE — Progress Notes (Signed)
    Cardiology Office Note  Date: 12/02/2023   ID: Chantae, Soo [birth year], MRN [id number]  History of Present Illness: [name] is a 78 y.o. female last seen in September 2024 by Ms. Miriam NP, I reviewed the note.  She presents for a follow-up visit.  She continues to report breakthrough atrial fibrillation at least twice a month, takes metoprolol  when this happens.  Most recently when she did this she had subsequent bradycardia when she converted and felt weak.  She has had no syncope or chest pain.  She did undergo left total knee arthroplasty in December 2024, undergoing PT at home and using a walker.  I reviewed her medications.  Current cardiovascular regimen includes Eliquis , Cardizem  CD, Lopressor  as needed, and Aldactone .  She apparently was taken off Zocor  at some point in the last few months.  LDL up to 115 following this.  She was not having any intolerances.  I reviewed her ECG today which shows normal sinus rhythm.  Today we discussed options for antiarrhythmic therapy.  Physical Exam: VS:  BP 128/82 (BP Location: Right Arm, Cuff Size: Normal)   Pulse 77   Ht 5' 2 (1.575 m)   SpO2 97%   BMI 31.46 kg/m , BMI Body mass index is 31.46 kg/m.  Wt Readings from Last 3 Encounters:  11/13/23 172 lb (78 kg)  11/04/23 169 lb (76.7 kg)  10/06/23 174 lb 8 oz (79.2 kg)    General: Patient appears comfortable at rest. HEENT: Conjunctiva and lids normal. Neck: Supple, no elevated JVP or carotid bruits. Lungs: Clear to auscultation, nonlabored breathing at rest. Cardiac: Regular rate and rhythm, no S3 or significant systolic murmur. Extremities: No pitting edema.  ECG:  An ECG dated 12/25/2022 was personally reviewed today and demonstrated:  Sinus rhythm.  Labwork: 12/26/2022: ALT 16; AST 20; Magnesium  2.2; TSH 1.620 11/04/2023: BUN 15; Creatinine, Ser 0.84; Hemoglobin 16.4; Platelets 298; Potassium 4.6; Sodium 137  July 2024: Cholesterol 200, triglycerides 146, HDL  59, LDL 115  Other Studies Reviewed Today:  Cardiac monitor April 2024: [facility] Scientific cardiac monitor reviewed ([employer]ian Mini PLUS Lite-MCT).  17 days analyzed.   Predominant rhythm is sinus with heart rate ranging from 53 bpm up to 138 bpm and average heart rate 71 bpm. PR prolongation noted, no pauses or high degree heart block. There were rare PACs and PVCs representing less than 1% total beats. No sustained arrhythmias.  Assessment and Plan:  1.  Paroxysmal atrial fibrillation with CHA2DS2-VASc score of 4.  She has had increasing episodes of breakthrough arrhythmia that are symptomatic.  Currently on Eliquis , Cardizem  CD, and as needed metoprolol .  We discussed options for antiarrhythmic therapy.  I would like to avoid amiodarone (known thyroid  disease and history of tremors), Multaq likely would be less effective.  Flecainide  would be a good option, but we will proceed with a Lexiscan  Myoview  and echocardiogram first to ensure no substantial ischemic heart disease or structural cardiac abnormalities.  Her ECG is normal today.  2.  Primary hypertension.  Blood pressure is well-controlled.  She is also on Aldactone .  3.  Mixed hyperlipidemia.  I reviewed her lab work.  Would replace Zocor  with Crestor  5 mg daily, this is less likely to interact with other therapies discussed above.  Disposition:  Follow up  6 weeks.  Signed, Jayson JUDITHANN Sierras, M.D., F.A.C.C.  [facility] at [facility]

## 2023-12-02 NOTE — Patient Instructions (Addendum)
 Medication Instructions:  Your physician has recommended you make the following change in your medication:  Start Rosuvastatin  5 mg daily in the evening Continue all other medications as prescribed  Labwork: none  Testing/Procedures: Your physician has requested that you have an echocardiogram. Echocardiography is a painless test that uses sound waves to create images of your heart. It provides your doctor with information about the size and shape of your heart and how well your heart's chambers and valves are working. This procedure takes approximately one hour. There are no restrictions for this procedure. Please do NOT wear cologne, perfume, aftershave, or lotions (deodorant is allowed). Please arrive 15 minutes prior to your appointment time.  Please note: We ask at that you not bring children with you during ultrasound (echo/ vascular) testing. Due to room size and safety concerns, children are not allowed in the ultrasound rooms during exams. Our front office staff cannot provide observation of children in our lobby area while testing is being conducted. An adult accompanying a patient to their appointment will only be allowed in the ultrasound room at the discretion of the ultrasound technician under special circumstances. We apologize for any inconvenience. Your physician has requested that you have a lexiscan  myoview . For further information please visit [email or web address] Please follow instruction sheet, as given.  Follow-Up: Your physician recommends that you schedule a follow-up appointment in: 6 weeks  Any Other Special Instructions Will Be Listed Below (If Applicable).  If you need a refill on your cardiac medications before your next appointment, please call your pharmacy.

## 2023-12-02 NOTE — Telephone Encounter (Signed)
 Advised patient that our office don't have a DPR on file that allows [place] to speak with daughters. Advised to fill this out at next visit. Also encouraged to have patient review visit in mychart. Verbalized understanding.

## 2023-12-07 DIAGNOSIS — M6281 Muscle weakness (generalized): Secondary | ICD-10-CM | POA: Diagnosis not present

## 2023-12-07 DIAGNOSIS — M25562 Pain in left knee: Secondary | ICD-10-CM | POA: Diagnosis not present

## 2023-12-07 DIAGNOSIS — Z471 Aftercare following joint replacement surgery: Secondary | ICD-10-CM | POA: Diagnosis not present

## 2023-12-08 ENCOUNTER — Ambulatory Visit: Payer: Medicare Other | Admitting: Nurse Practitioner

## 2023-12-08 ENCOUNTER — Ambulatory Visit (HOSPITAL_COMMUNITY): Payer: Medicare Other

## 2023-12-08 ENCOUNTER — Encounter (HOSPITAL_COMMUNITY): Payer: Medicare Other

## 2023-12-09 ENCOUNTER — Ambulatory Visit: Payer: Medicare Other

## 2023-12-10 DIAGNOSIS — M6281 Muscle weakness (generalized): Secondary | ICD-10-CM | POA: Diagnosis not present

## 2023-12-10 DIAGNOSIS — Z471 Aftercare following joint replacement surgery: Secondary | ICD-10-CM | POA: Diagnosis not present

## 2023-12-10 DIAGNOSIS — M25562 Pain in left knee: Secondary | ICD-10-CM | POA: Diagnosis not present

## 2023-12-14 ENCOUNTER — Ambulatory Visit (HOSPITAL_BASED_OUTPATIENT_CLINIC_OR_DEPARTMENT_OTHER)
Admission: RE | Admit: 2023-12-14 | Discharge: 2023-12-14 | Disposition: A | Discharge status: Home / Self Care | Payer: Medicare Other | Source: Ambulatory Visit | Attending: Cardiology | Admitting: Cardiology

## 2023-12-14 ENCOUNTER — Ambulatory Visit (HOSPITAL_COMMUNITY)
Admission: RE | Admit: 2023-12-14 | Discharge: 2023-12-14 | Disposition: A | Discharge status: Home / Self Care | Payer: Medicare Other | Source: Ambulatory Visit | Attending: Cardiology | Admitting: Cardiology

## 2023-12-14 ENCOUNTER — Encounter (HOSPITAL_COMMUNITY): Payer: Self-pay | Admitting: Physician Assistant

## 2023-12-14 DIAGNOSIS — R0602 Shortness of breath: Secondary | ICD-10-CM | POA: Insufficient documentation

## 2023-12-14 DIAGNOSIS — I480 Paroxysmal atrial fibrillation: Secondary | ICD-10-CM | POA: Diagnosis not present

## 2023-12-14 LAB — NM MYOCAR MULTI W/SPECT W/WALL MOTION / EF
Base ST Depression (mm): 0 mm
LV dias vol: 64 mL (ref 46–106)
LV sys vol: 8 mL
Nuc Stress EF: 87 %
Peak HR: 94 {beats}/min
RATE: 0.3
Rest HR: 58 {beats}/min
Rest Nuclear Isotope Dose: 11 mCi
SDS: 3
SRS: 2
SSS: 5
ST Depression (mm): 0 mm
Stress Nuclear Isotope Dose: 31.1 mCi
TID: 0.86

## 2023-12-14 MED ORDER — AMINOPHYLLINE 25 MG/ML IV SOLN
INTRAVENOUS | Status: AC
  Administered 2023-12-14: 75 mg via INTRAVENOUS
  Filled 2023-12-14: qty 10

## 2023-12-14 MED ORDER — REGADENOSON 0.4 MG/5ML IV SOLN
INTRAVENOUS | Status: AC
  Administered 2023-12-14: 0.4 mg via INTRAVENOUS
  Filled 2023-12-14: qty 5

## 2023-12-14 MED ORDER — TECHNETIUM TC 99M TETROFOSMIN IV KIT
11.0000 | PACK | Freq: Once | INTRAVENOUS | Status: AC | PRN
  Administered 2023-12-14: 11 via INTRAVENOUS

## 2023-12-14 MED ORDER — AMINOPHYLLINE 25 MG/ML IV (NUC MED)
75.0000 mg | Freq: Once | INTRAVENOUS | Status: DC
  Filled 2023-12-14: qty 3

## 2023-12-14 MED ORDER — TECHNETIUM TC 99M TETROFOSMIN IV KIT
31.1000 | PACK | Freq: Once | INTRAVENOUS | Status: AC | PRN
  Administered 2023-12-14: 31.1 via INTRAVENOUS

## 2023-12-14 MED ORDER — SODIUM CHLORIDE FLUSH 0.9 % IV SOLN
INTRAVENOUS | Status: AC
  Administered 2023-12-14: 10 mL via INTRAVENOUS
  Filled 2023-12-14: qty 10

## 2023-12-15 ENCOUNTER — Encounter: Payer: Self-pay | Admitting: *Deleted

## 2023-12-15 DIAGNOSIS — M6281 Muscle weakness (generalized): Secondary | ICD-10-CM | POA: Diagnosis not present

## 2023-12-15 DIAGNOSIS — M25562 Pain in left knee: Secondary | ICD-10-CM | POA: Diagnosis not present

## 2023-12-15 DIAGNOSIS — Z471 Aftercare following joint replacement surgery: Secondary | ICD-10-CM | POA: Diagnosis not present

## 2023-12-17 ENCOUNTER — Ambulatory Visit: Payer: Medicare Other | Attending: Cardiology

## 2023-12-17 DIAGNOSIS — I480 Paroxysmal atrial fibrillation: Secondary | ICD-10-CM | POA: Diagnosis not present

## 2023-12-17 DIAGNOSIS — R0602 Shortness of breath: Secondary | ICD-10-CM | POA: Diagnosis not present

## 2023-12-17 DIAGNOSIS — Z471 Aftercare following joint replacement surgery: Secondary | ICD-10-CM | POA: Diagnosis not present

## 2023-12-17 DIAGNOSIS — M6281 Muscle weakness (generalized): Secondary | ICD-10-CM | POA: Diagnosis not present

## 2023-12-17 DIAGNOSIS — M25562 Pain in left knee: Secondary | ICD-10-CM | POA: Diagnosis not present

## 2023-12-17 LAB — ECHOCARDIOGRAM COMPLETE
AR max vel: 2.65 cm2
AV Area VTI: 2.86 cm2
AV Area mean vel: 2.78 cm2
AV Mean grad: 5 mm[Hg]
AV Peak grad: 7.7 mm[Hg]
Ao pk vel: 1.39 m/s
Area-P 1/2: 2.92 cm2
Calc EF: 56.5 %
MV VTI: 2.27 cm2
S' Lateral: 2.5 cm
Single Plane A2C EF: 53.4 %
Single Plane A4C EF: 57.6 %

## 2023-12-18 ENCOUNTER — Telehealth: Payer: Self-pay | Admitting: *Deleted

## 2023-12-18 MED ORDER — FLECAINIDE ACETATE 50 MG PO TABS: 50.0000 mg | ORAL_TABLET | Freq: Two times a day (BID) | ORAL | 1 refills | Status: DC

## 2023-12-18 NOTE — Telephone Encounter (Signed)
-----   Message from Nona Dell sent at 12/18/2023  2:30 PM EST ----- Results reviewed.  Echocardiogram shows normal LVEF at 60 to 65%, normal estimated right heart pressures, mildly calcified aortic valve but no stenosis.  Recent Myoview was low risk and did not suggest ischemia.  As per recent office visit, suggest starting flecainide 50 mg twice daily to see if we can keep her episodes of atrial fibrillation better controlled.  Continue remaining regimen as is.  She would need a nurse visit with ECG in 1 week.

## 2023-12-18 NOTE — Telephone Encounter (Signed)
Patient informed and verbalized understanding of plan. Copy sent to PCP

## 2023-12-18 NOTE — Telephone Encounter (Signed)
-----   Message from Nona Dell sent at 12/14/2023  2:56 PM EST ----- Results reviewed.  Side effects with Lexiscan noted.  Overall however, stress test was low risk and did not suggest obstructive CAD, LVEF normal.  Await echocardiogram results.

## 2023-12-22 DIAGNOSIS — M6281 Muscle weakness (generalized): Secondary | ICD-10-CM | POA: Diagnosis not present

## 2023-12-22 DIAGNOSIS — Z471 Aftercare following joint replacement surgery: Secondary | ICD-10-CM | POA: Diagnosis not present

## 2023-12-22 DIAGNOSIS — M25562 Pain in left knee: Secondary | ICD-10-CM | POA: Diagnosis not present

## 2023-12-24 ENCOUNTER — Telehealth: Payer: Self-pay | Admitting: Cardiology

## 2023-12-24 DIAGNOSIS — I480 Paroxysmal atrial fibrillation: Secondary | ICD-10-CM

## 2023-12-24 NOTE — Telephone Encounter (Signed)
Patient stated that since she started taking her Flecainide she has been experiencing symptoms of throwing up. She stated that when she took the first dose she believed to have went into Afib. She had took her BP this morning as was 143/70 HR 84. Last night she stated that he HR was 140. Took the 2 doses yesterday and only one dose today so far. Patient stated that she wasn't dizzy or had any SOB. No chest pain it was a discomfort feeling. Feels that she cannot continue to taking. Please Advise

## 2023-12-24 NOTE — Telephone Encounter (Signed)
Patient c/o Palpitations:  STAT if patient reporting lightheadedness, shortness of breath, or chest pain  How long have you had palpitations/irregular HR/ Afib? Are you having the symptoms now?   No  Are you currently experiencing lightheadedness, SOB or CP?   Chest tightness  Do you have a history of afib (atrial fibrillation) or irregular heart rhythm?   Yes  Have you checked your BP or HR? (document readings if available):   Not taken yet  Are you experiencing any other symptoms?   No  Patient stated she started getting afib symptoms after starting her flecainide (TAMBOCOR) 50 MG tablet medication yesterday.  Patient noted she is scheduled on 2/7 for a 1 week f/u visit.

## 2023-12-24 NOTE — Telephone Encounter (Signed)
Pt calling back due to not receiving a cb yet, states she is now having nausea, requesting cb

## 2023-12-25 NOTE — Telephone Encounter (Signed)
Spoke with patient regarding Dr. Diona Browner response:  Thank you for letting me know.  Somewhat unusual symptoms after only just a few doses and doubt that it would have precipitated more atrial fibrillation.  If she is uncomfortable taking flecainide please refer her for EP consultation to discuss other treatment options.  May be a potential candidate for Tikosyn or ablation.   Patient stated that she wanted to wait before she agreed to see EP. Wanted to cancel her nurse visit on 02/07 since she wasn't taking the medication anymore and see Lanora Manis and speak with her regarding seeing EP.

## 2023-12-28 NOTE — Addendum Note (Signed)
Addended by: Eustace Moore on: 12/28/2023 01:02 PM   Modules accepted: Orders

## 2023-12-28 NOTE — Telephone Encounter (Signed)
Can you help with this referral?  ----- Message -----  From: Quin Hoop  Sent: 12/28/2023   8:48 AM EST  To: [facility]  Subject: Appointment Request                              He had suggested I go to [facility] to the group of Drs on [address]. I have decided after AFib twice last week that I will go ahead and get appointment with them> He has to do referral to them. any questions call me [phone].

## 2023-12-29 DIAGNOSIS — M6281 Muscle weakness (generalized): Secondary | ICD-10-CM | POA: Diagnosis not present

## 2023-12-29 DIAGNOSIS — M25562 Pain in left knee: Secondary | ICD-10-CM | POA: Diagnosis not present

## 2023-12-29 DIAGNOSIS — Z471 Aftercare following joint replacement surgery: Secondary | ICD-10-CM | POA: Diagnosis not present

## 2024-01-01 ENCOUNTER — Ambulatory Visit: Payer: Medicare Other

## 2024-01-05 DIAGNOSIS — Z471 Aftercare following joint replacement surgery: Secondary | ICD-10-CM | POA: Diagnosis not present

## 2024-01-05 DIAGNOSIS — M6281 Muscle weakness (generalized): Secondary | ICD-10-CM | POA: Diagnosis not present

## 2024-01-05 DIAGNOSIS — M25562 Pain in left knee: Secondary | ICD-10-CM | POA: Diagnosis not present

## 2024-01-07 DIAGNOSIS — M25562 Pain in left knee: Secondary | ICD-10-CM | POA: Diagnosis not present

## 2024-01-07 DIAGNOSIS — M6281 Muscle weakness (generalized): Secondary | ICD-10-CM | POA: Diagnosis not present

## 2024-01-07 DIAGNOSIS — Z471 Aftercare following joint replacement surgery: Secondary | ICD-10-CM | POA: Diagnosis not present

## 2024-01-12 DIAGNOSIS — Z471 Aftercare following joint replacement surgery: Secondary | ICD-10-CM | POA: Diagnosis not present

## 2024-01-12 DIAGNOSIS — R5383 Other fatigue: Secondary | ICD-10-CM | POA: Diagnosis not present

## 2024-01-12 DIAGNOSIS — I4891 Unspecified atrial fibrillation: Secondary | ICD-10-CM | POA: Diagnosis not present

## 2024-01-12 DIAGNOSIS — I272 Pulmonary hypertension, unspecified: Secondary | ICD-10-CM | POA: Diagnosis not present

## 2024-01-12 DIAGNOSIS — I10 Essential (primary) hypertension: Secondary | ICD-10-CM | POA: Diagnosis not present

## 2024-01-12 DIAGNOSIS — M6281 Muscle weakness (generalized): Secondary | ICD-10-CM | POA: Diagnosis not present

## 2024-01-12 DIAGNOSIS — E261 Secondary hyperaldosteronism: Secondary | ICD-10-CM | POA: Diagnosis not present

## 2024-01-12 DIAGNOSIS — Z79899 Other long term (current) drug therapy: Secondary | ICD-10-CM | POA: Diagnosis not present

## 2024-01-12 DIAGNOSIS — E7800 Pure hypercholesterolemia, unspecified: Secondary | ICD-10-CM | POA: Diagnosis not present

## 2024-01-12 DIAGNOSIS — Z0000 Encounter for general adult medical examination without abnormal findings: Secondary | ICD-10-CM | POA: Diagnosis not present

## 2024-01-12 DIAGNOSIS — Z299 Encounter for prophylactic measures, unspecified: Secondary | ICD-10-CM | POA: Diagnosis not present

## 2024-01-12 DIAGNOSIS — Z7189 Other specified counseling: Secondary | ICD-10-CM | POA: Diagnosis not present

## 2024-01-12 DIAGNOSIS — M25562 Pain in left knee: Secondary | ICD-10-CM | POA: Diagnosis not present

## 2024-01-13 ENCOUNTER — Telehealth: Payer: Self-pay | Admitting: Nurse Practitioner

## 2024-01-13 NOTE — Telephone Encounter (Signed)
STAT if HR is under 50 or over 120 (normal HR is 60-100 beats per minute)  What is your heart rate? 138  Do you have a log of your heart rate readings (document readings)? No  Do you have any other symptoms? Chest feels tight Call transferred

## 2024-01-13 NOTE — Telephone Encounter (Signed)
Patient stated that she is in Afib. BP was 102/67 HR 140 when she called in. Reported that her chest is tight pain level a 4. No there symptoms to report. She stated that she was light headed, but when she sat down it went away. Reported the only medication she has taken is Diltiazem. Hasn't taken Metoprolol as of yet stated that it makes her BP drop at times. Gave me another reading before we got off the phone 116/75 HR 131. Has an appointment with Lanora Manis tomorrow, but may need to change to phone visit. Please advise

## 2024-01-13 NOTE — Telephone Encounter (Signed)
Thank you for the update.  I presume she is also taking flecainide 50 mg twice daily?  If that is the case she can go ahead and take an extra flecainide 100 mg dose now to see if this helps her convert back to sinus rhythm.  Typically her episodes are self-limited.  I would say however that if she remains symptomatic with chest pain she may need to go to the ER if this does not settle down.   Patient hasn't been taking Flecainide 50 mg twice daily. At first she thought it was making her sick but now is feeling better. She stated that she will start back now and will keep an eye on her symptoms and check HR and BP to make sure its normal. Will let us know how she is doing with Flecainide. Sent message to schedulers regarding appointment tomorrow

## 2024-01-14 ENCOUNTER — Encounter: Payer: Self-pay | Admitting: Nurse Practitioner

## 2024-01-14 ENCOUNTER — Ambulatory Visit: Payer: Medicare Other | Attending: Nurse Practitioner | Admitting: Nurse Practitioner

## 2024-01-14 ENCOUNTER — Telehealth: Payer: Self-pay | Admitting: Nurse Practitioner

## 2024-01-14 VITALS — BP 146/75 | HR 78 | Wt 165.7 lb

## 2024-01-14 DIAGNOSIS — M25562 Pain in left knee: Secondary | ICD-10-CM | POA: Diagnosis not present

## 2024-01-14 DIAGNOSIS — E785 Hyperlipidemia, unspecified: Secondary | ICD-10-CM | POA: Diagnosis not present

## 2024-01-14 DIAGNOSIS — Z471 Aftercare following joint replacement surgery: Secondary | ICD-10-CM | POA: Diagnosis not present

## 2024-01-14 DIAGNOSIS — R0789 Other chest pain: Secondary | ICD-10-CM | POA: Diagnosis not present

## 2024-01-14 DIAGNOSIS — E039 Hypothyroidism, unspecified: Secondary | ICD-10-CM

## 2024-01-14 DIAGNOSIS — I10 Essential (primary) hypertension: Secondary | ICD-10-CM

## 2024-01-14 DIAGNOSIS — M6281 Muscle weakness (generalized): Secondary | ICD-10-CM | POA: Diagnosis not present

## 2024-01-14 DIAGNOSIS — I480 Paroxysmal atrial fibrillation: Secondary | ICD-10-CM | POA: Diagnosis not present

## 2024-01-14 DIAGNOSIS — R002 Palpitations: Secondary | ICD-10-CM | POA: Diagnosis not present

## 2024-01-14 NOTE — Telephone Encounter (Signed)
  Patient Consent for Virtual Visit        [name] has provided verbal consent on 01/14/2024 for a virtual visit (video or telephone).   CONSENT FOR VIRTUAL VISIT FOR:  [name]  By participating in this virtual visit I agree to the following:  I hereby voluntarily request, consent and authorize [facility] and its employed or contracted physicians, physician assistants, nurse practitioners or other licensed health care professionals (the Practitioner), to provide me with telemedicine health care services (the "Services") as deemed necessary by the treating Practitioner. I acknowledge and consent to receive the Services by the Practitioner via telemedicine. I understand that the telemedicine visit will involve communicating with the Practitioner through live audiovisual communication technology and the disclosure of certain medical information by electronic transmission. I acknowledge that I have been given the opportunity to request an in-person assessment or other available alternative prior to the telemedicine visit and am voluntarily participating in the telemedicine visit.  I understand that I have the right to withhold or withdraw my consent to the use of telemedicine in the course of my care at any time, without affecting my right to future care or treatment, and that the Practitioner or I may terminate the telemedicine visit at any time. I understand that I have the right to inspect all information obtained and/or recorded in the course of the telemedicine visit and may receive copies of available information for a reasonable fee.  I understand that some of the potential risks of receiving the Services via telemedicine include:  Delay or interruption in medical evaluation due to technological equipment failure or disruption; Information transmitted may not be sufficient (e.g. poor resolution of images) to allow for appropriate medical decision making by the  Practitioner; and/or  In rare instances, security protocols could fail, causing a breach of personal health information.  Furthermore, I acknowledge that it is my responsibility to provide information about my medical history, conditions and care that is complete and accurate to the best of my ability. I acknowledge that Practitioner's advice, recommendations, and/or decision may be based on factors not within their control, such as incomplete or inaccurate data provided by me or distortions of diagnostic images or specimens that may result from electronic transmissions. I understand that the practice of medicine is not an exact science and that Practitioner makes no warranties or guarantees regarding treatment outcomes. I acknowledge that a copy of this consent can be made available to me via my patient portal [facility]), or I can request a printed copy by calling the office of [facility].    I understand that my insurance will be billed for this visit.   I have read or had this consent read to me. I understand the contents of this consent, which adequately explains the benefits and risks of the Services being provided via telemedicine.  I have been provided ample opportunity to ask questions regarding this consent and the Services and have had my questions answered to my satisfaction. I give my informed consent for the services to be provided through the use of telemedicine in my medical care

## 2024-01-14 NOTE — Progress Notes (Signed)
Virtual Visit via Telephone Note   Because of Icy J Clemenson's co-morbid illnesses, she is at least at moderate risk for complications without adequate follow up.  This format is felt to be most appropriate for this patient at this time.  The patient did not have access to video technology/had technical difficulties with video requiring transitioning to audio format only (telephone).  All issues noted in this document were discussed and addressed.  No physical exam could be performed with this format.  Please refer to the patient's chart for her consent to telehealth for [facility].    Date:  01/14/2024   ID:  Marliyah, Reid 10/11/1946, MRN [id number] The patient was identified using 2 identifiers.  Patient Location: Home Provider Location: Office/Clinic   PCP:  Ignatius Specking, MD   [facility] Providers Cardiologist:  Nona Dell, MD     Evaluation Performed:  Follow-Up Visit  Chief Complaint:  A-fib Follow-up  History of Present Illness:    ELLAR HAKALA is a 78 y.o. female with a PMH of PAF, HTN, hypothyroidism, HLD, anxiety, and depression, who presents today for A-fib follow-up.   Last seen by Dr. Diona Browner on December 02, 2023.  She continued to report breakthrough episodes of A-fib typically occurring twice a month will take metoprolol during these episodes.  She had a recent episode of subsequent bradycardia when she converted and felt weak.  Options for antiarrhythmic therapy were discussed.  Lexiscan and echocardiogram arranged to consider flecainide.  See these reports below. Was started on low dose Flecainide.   Today she presents for telephone visit.  She is unable to come to the office today due to the weather.  She says that she believes she had a virus that caused her to feel nauseous when she first started taking Flecainide.  She says she restarted this yesterday and so far is feeling well and tolerating well.  Tells me she had a  significant episode of A-fib yesterday, says she did not want to go to the ER.  Does describe having chest tightness when having episodes of atrial fibrillation.  She has upcoming appointment with electrophysiology at the end of next month. Denies any shortness of breath, syncope, presyncope, dizziness, orthopnea, PND, swelling or significant weight changes, acute bleeding, or claudication.  SH: In her free time, she enjoys crossword puzzles, reading, and Bible studies.   ROS:   Please see the history of present illness.    All other systems reviewed and are negative.   Prior CV studies:   The following studies were reviewed today:  Echo 11/2023:  1. Left ventricular ejection fraction, by estimation, is 60 to 65%. The left ventricle has normal function. The left ventricle has no regional wall motion abnormalities. There is mild left ventricular hypertrophy. Left ventricular diastolic parameters are consistent with Grade I diastolic dysfunction (impaired relaxation).   2. Right ventricular systolic function is normal. The right ventricular size is normal. There is normal pulmonary artery systolic pressure. The estimated right ventricular systolic pressure is 28.4 mmHg.   3. The mitral valve is normal in structure. Trivial mitral valve  regurgitation. No evidence of mitral stenosis.   4. The aortic valve is tricuspid. There is mild calcification of the  aortic valve. Aortic valve regurgitation is not visualized. Aortic valve sclerosis is present, with no evidence of aortic valve stenosis.   5. The inferior vena cava is normal in size with greater than 50%  respiratory variability, suggesting  right atrial pressure of 3 mmHg.   Lexiscan 11/2023:    Patient had symptomatic hypotension and nausea after Lexiscan injection. Hypotension improved with IV fluids. Nausea persisted for a while but resolved after IV aminophylline 75 mg administration. Patient in stable condition when she left the lab.    Stress ECG is negative for ischemia and arrhythmias.   LV perfusion is normal. There is no evidence of ischemia. There is no evidence of infarction.   Left ventricular function is normal. Nuclear stress EF: 87%.   Findings are consistent with no ischemia and no infarction. The study is low risk.   Cardiac monitor 02/2023: Predominant rhythm is sinus with heart rate ranging from 53 bpm up to 138 bpm and average heart rate 71 bpm. PR prolongation noted, no pauses or high degree heart block. There were rare PACs and PVCs representing less than 1% total beats. No sustained arrhythmias.  Echocardiogram on 11/14/2018: Study Conclusions   - Left ventricle: The cavity size was normal. Wall thickness was    increased in a pattern of moderate LVH. Systolic function was    normal. The estimated ejection fraction was in the range of 55%    to 60%. Wall motion was normal; there were no regional wall    motion abnormalities. Features are consistent with a pseudonormal    left ventricular filling pattern, with concomitant abnormal    relaxation and increased filling pressure (grade 2 diastolic    dysfunction).   Impressions:   - Normal LV systolic function; moderate diastolic dysfunction;    moderate LVH.  Labs/Other Tests and Data Reviewed:    EKG:  EKG is not ordered today.   Wt Readings from Last 3 Encounters:  01/14/24 165 lb 11.2 oz (75.2 kg)  11/13/23 172 lb (78 kg)  11/04/23 169 lb (76.7 kg)    STOP-Bang Score:  3  { Objective:    Vital Signs:  BP (!) 146/75   Pulse 78   Wt 165 lb 11.2 oz (75.2 kg)   BMI 30.31 kg/m    Due to nature of today's visit, a physical exam was unable to be performed.   ASSESSMENT & PLAN:    PAF, palpitations, chest tightness Admits to episodes of A-fib since last OV, had a significant episode yesterday and experiences chest tightness during episodes.  See recent cardiac workup noted above that was reassuring.  Restarted low-dose flecainide and says  she is tolerating this well since starting yesterday.  Have previously discussed avoiding triggers to A-fib and previously recommended [facility] mobile app.  Follow-up with electrophysiology as scheduled.  Heart healthy diet and regular cardiovascular exercise encouraged.  Continue current medication regimen and discussed parameters of when to take metoprolol as needed.  She verbalized understanding.  Care and ED precautions discussed.  Will bring patient back in 1 week for nurse visit to arrange EKG.  HTN BP stable.  Continue current medication regimen. Discussed to monitor BP at home at least 2 hours after medications and sitting for 5-10 minutes. Heart healthy diet and regular cardiovascular exercise encouraged.   HLD LDL 103 12/2023.  Being managed by PCP.  Did discuss increasing rosuvastatin 10 mg daily, patient declines at this time and says she wants to hold off for now.  Heart healthy diet and regular cardiovascular exercise encouraged. Continue current medication regimen. Continue to follow with PCP.  Hypothyroidism Recent labwork stable.  Continue levothyroxine. Continue to follow with PCP.   Time:   Today, I have spent 16  minutes with the patient with telehealth technology discussing the above problems.     Medication Adjustments/Labs and Tests Ordered: Current medicines are reviewed at length with the patient today.  Concerns regarding medicines are outlined above.   Tests Ordered: No orders of the defined types were placed in this encounter.   Medication Changes: No orders of the defined types were placed in this encounter.   Follow Up:  In Person in 2-3 month(s)  Signed, Sharlene Dory, NP

## 2024-01-14 NOTE — Patient Instructions (Signed)
Medication Instructions:  Your physician recommends that you continue on your current medications as directed. Please refer to the Current Medication list given to you today.  Labwork: None   Testing/Procedures: None   Follow-Up: Your physician recommends that you schedule a follow-up appointment in:  1 week nurse visit for EKG  2-3 months   Any Other Special Instructions Will Be Listed Below (If Applicable).  If you need a refill on your cardiac medications before your next appointment, please call your pharmacy.

## 2024-01-18 NOTE — Telephone Encounter (Signed)
 Per Dr. Diona Browner response: Thank you.  I do think it is worth a re-trial of flecainide, she had had some symptoms initially that were fairly atypical as a side effect and may not have been related.  If she cannot tolerate flecainide, likely needs EP consultation as already indicated to discuss other antiarrhythmic options.   Spoke with Patient this morning stated that she has been tolerating the Flecainide 50 mg twice daily with no issues. Figured it may been that she had a virus will be coming in on Thursday for an EKG. No further issues at this time.

## 2024-01-19 DIAGNOSIS — M6281 Muscle weakness (generalized): Secondary | ICD-10-CM | POA: Diagnosis not present

## 2024-01-19 DIAGNOSIS — M25562 Pain in left knee: Secondary | ICD-10-CM | POA: Diagnosis not present

## 2024-01-19 DIAGNOSIS — Z471 Aftercare following joint replacement surgery: Secondary | ICD-10-CM | POA: Diagnosis not present

## 2024-01-21 ENCOUNTER — Ambulatory Visit: Payer: Medicare Other | Attending: Cardiology | Admitting: *Deleted

## 2024-01-21 VITALS — BP 128/81 | HR 67

## 2024-01-21 DIAGNOSIS — I480 Paroxysmal atrial fibrillation: Secondary | ICD-10-CM

## 2024-01-21 DIAGNOSIS — R002 Palpitations: Secondary | ICD-10-CM

## 2024-01-21 NOTE — Progress Notes (Unsigned)
 Patient in office for EKG.  BP checked also as she states that she has been running high at times.  States she took am meds around 8:00.    BP  128/81  HR  67  Advised to check BP 2 hours after morning medications with sitting 5-10 minutes prior to checking.  She verbalized understanding.

## 2024-01-22 ENCOUNTER — Encounter: Payer: Self-pay | Admitting: *Deleted

## 2024-01-22 NOTE — Progress Notes (Signed)
 Notified via mychart.

## 2024-02-11 DIAGNOSIS — M25511 Pain in right shoulder: Secondary | ICD-10-CM | POA: Diagnosis not present

## 2024-02-16 ENCOUNTER — Encounter: Payer: Self-pay | Admitting: Cardiovascular Disease

## 2024-02-16 ENCOUNTER — Ambulatory Visit: Payer: Medicare Other | Attending: Cardiovascular Disease | Admitting: Cardiovascular Disease

## 2024-02-16 VITALS — BP 154/88 | HR 66 | Ht 62.0 in | Wt 162.0 lb

## 2024-02-16 DIAGNOSIS — R002 Palpitations: Secondary | ICD-10-CM | POA: Diagnosis not present

## 2024-02-16 DIAGNOSIS — I480 Paroxysmal atrial fibrillation: Secondary | ICD-10-CM

## 2024-02-16 NOTE — Patient Instructions (Addendum)
 Medication Instructions:  Your physician recommends that you continue on your current medications as directed. Please refer to the Current Medication list given to you today. *If you need a refill on your cardiac medications before your next appointment, please call your pharmacy*   Lab Work: CBC and BMET - please have pre-procedure labs completed at ANY [facility] near you on Monday, April 14 (or a day or two after) - no appointment required and this does not need to be fasting If you have labs (blood work) drawn today and your tests are completely normal, you will receive your results only by: MyChart Message (if you have MyChart) OR A paper copy in the mail If you have any lab test that is abnormal or we need to change your treatment, we will call you to review the results.   Testing/Procedures: Cardiac CT - someone will contact you to schedule this Your physician has requested that you have cardiac CT. Cardiac computed tomography (CT) is a painless test that uses an x-ray machine to take clear, detailed pictures of your heart. For further information please visit [email or web address] Please follow instruction sheet as given.  Atrial Fibrillation Ablation - scheduled on Tuesday, May 13 Your physician has recommended that you have an ablation. Catheter ablation is a medical procedure used to treat some cardiac arrhythmias (irregular heartbeats). During catheter ablation, a long, thin, flexible tube is put into a blood vessel in your groin (upper thigh), or neck. This tube is called an ablation catheter. It is then guided to your heart through the blood vessel. Radio frequency waves destroy small areas of heart tissue where abnormal heartbeats may cause an arrhythmia to start. Please see the instruction sheet given to you today.   Follow-Up: At [facility], you and your health needs are our priority.  As part of our continuing mission to provide you with exceptional heart care, we have  created designated Provider Care Teams.  These Care Teams include your primary Cardiologist (physician) and Advanced Practice Providers (APPs -  Physician Assistants and Nurse Practitioners) who all work together to provide you with the care you need, when you need it.  We recommend signing up for the patient portal called "MyChart".  Sign up information is provided on this After Visit Summary.  MyChart is used to connect with patients for Virtual Visits (Telemedicine).  Patients are able to view lab/test results, encounter notes, upcoming appointments, etc.  Non-urgent messages can be sent to your provider as well.   To learn more about what you can do with MyChart, go to [email or web address].    Your next appointment:   We will schedule follow up after your ablation   Provider:   York Pellant, MD

## 2024-02-16 NOTE — Progress Notes (Signed)
 Electrophysiology Office Note:    Date:  02/16/2024   ID:  [name], [name] 12-Oct-1946, MRN [id number]  PCP:  Ignatius Specking, MD   [facility] Providers Cardiologist:  Nona Dell, MD     Referring MD: Jonelle Sidle, MD   History of Present Illness:    [name] is a 78 y.o. female with a medical history significant for paroxysmal atrial fibrillation, hypothyroid, referred for atrial fibrillation.      I discussed the use of AI scribe software for clinical note transcription with the patient, who gave verbal consent to proceed.  She has a history of atrial fibrillation, first diagnosed in December 2019, confirmed by an EKG. Initially, she was hospitalized and started on Eliquis. Despite initial control, she began experiencing frequent episodes, leading to the addition of flecainide to her regimen.  Since starting flecainide, the frequency of her atrial fibrillation episodes has decreased, but she still experiences episodes, including one where her heart rate reached 114 bpm. She notes higher blood pressure, facial flushing, and headaches, raising concerns about the effectiveness of flecainide.  She describes a sensation of chest tightness at night when lying down, accompanied by discomfort, though she is uncertain if this is related to her atrial fibrillation.  During a recent knee replacement surgery, she was noted to be in atrial fibrillation throughout the procedure.     Today, she reports that she feels well.  She has no complaints.  Palpitations have improved on flecainide but still recur.  EKGs/Labs/Other Studies Reviewed Today:     Echocardiogram:  TTE January 2025 EF 60 to 65%.  Mild LVH.  Grade 1 diastolic dysfunction.   Monitors:  14 day monitor April 2024-- my interpretation Sinus rhythm.  No atrial fibrillation reported  Stress testing:  Nuclear stress test January 2025 No evidence of ischemia   EKG:   EKG  Interpretation Date/Time:  Tuesday February 16 2024 11:20:31 EDT Ventricular Rate:  66 PR Interval:  202 QRS Duration:  112 QT Interval:  404 QTC Calculation: 423 R Axis:   42  Text Interpretation: Normal sinus rhythm Normal ECG When compared with ECG of 21-Jan-2024 09:11, No significant change was found Confirmed by York Pellant [phone]) on 02/16/2024 11:43:14 AM     Physical Exam:    VS:  BP (!) 154/88 (BP Location: Left Arm, Patient Position: Sitting, Cuff Size: Normal)   Pulse 66   Ht 5\' 2"  (1.575 m)   Wt 162 lb (73.5 kg)   SpO2 96%   BMI 29.63 kg/m     Wt Readings from Last 3 Encounters:  02/16/24 162 lb (73.5 kg)  01/14/24 165 lb 11.2 oz (75.2 kg)  11/13/23 172 lb (78 kg)     GEN: Well nourished, well developed in no acute distress CARDIAC: RRR, no murmurs, rubs, gallops RESPIRATORY:  Normal work of breathing MUSCULOSKELETAL: no edema    ASSESSMENT & PLAN:     Paroxysmal atrial fibrillation Symptomatic with palpitations, fatigue Having recurrence on flecainide We discussed management options.  Given that she has failed flecainide and is symptomatic, I recommended ablation.  Using a shared decision making approach, we decided to proceed with scheduling Continue flecainide 50 mg twice daily, diltiazem 240 mg daily pending ablation  We discussed the indication, rationale, logistics, anticipated benefits, and potential risks of the ablation procedure including but not limited to -- bleed at the groin access site, chest pain, damage to nearby organs such as the diaphragm, lungs, or esophagus,  need for a drainage tube, or prolonged hospitalization. I explained that the risk for stroke, heart attack, need for open chest surgery, or even death is very low but not zero. she  expressed understanding and wishes to proceed.   Secondary hypercoagulable state Continue Eliquis 5 mg twice daily     Signed, Maurice Small, MD  02/16/2024 11:43 AM    [facility]

## 2024-02-25 ENCOUNTER — Other Ambulatory Visit: Payer: Self-pay | Admitting: Cardiology

## 2024-03-08 ENCOUNTER — Telehealth: Payer: Self-pay

## 2024-03-08 DIAGNOSIS — R002 Palpitations: Secondary | ICD-10-CM | POA: Diagnosis not present

## 2024-03-08 DIAGNOSIS — I480 Paroxysmal atrial fibrillation: Secondary | ICD-10-CM | POA: Diagnosis not present

## 2024-03-08 LAB — CBC
Hematocrit: 49.5 % — ABNORMAL HIGH (ref 34.0–46.6)
Hemoglobin: 16 g/dL — ABNORMAL HIGH (ref 11.1–15.9)
MCH: 30.3 pg (ref 26.6–33.0)
MCHC: 32.3 g/dL (ref 31.5–35.7)
MCV: 94 fL (ref 79–97)
Platelets: 279 10*3/uL (ref 150–450)
RBC: 5.28 x10E6/uL (ref 3.77–5.28)
RDW: 12.5 % (ref 11.7–15.4)
WBC: 7.9 10*3/uL (ref 3.4–10.8)

## 2024-03-08 NOTE — Telephone Encounter (Signed)
 Called pt and went over detailed instructions of CT/Ablation.  She had no further questions. I have mailed her a copy of her instructions. She has my direct number to call if she has any questions or concerns.

## 2024-03-09 LAB — BASIC METABOLIC PANEL WITH GFR
BUN/Creatinine Ratio: 14 (ref 12–28)
BUN: 13 mg/dL (ref 8–27)
CO2: 24 mmol/L (ref 20–29)
Calcium: 10.5 mg/dL — ABNORMAL HIGH (ref 8.7–10.3)
Chloride: 102 mmol/L (ref 96–106)
Creatinine, Ser: 0.94 mg/dL (ref 0.57–1.00)
Glucose: 85 mg/dL (ref 70–99)
Potassium: 4.6 mmol/L (ref 3.5–5.2)
Sodium: 141 mmol/L (ref 134–144)
eGFR: 62 mL/min/{1.73_m2} (ref >59)

## 2024-03-10 ENCOUNTER — Encounter: Payer: Self-pay | Admitting: Emergency Medicine

## 2024-03-14 ENCOUNTER — Encounter: Payer: Self-pay | Admitting: Cardiovascular Disease

## 2024-03-15 ENCOUNTER — Ambulatory Visit (HOSPITAL_COMMUNITY)
Admission: RE | Admit: 2024-03-15 | Discharge: 2024-03-15 | Disposition: A | Discharge status: Home / Self Care | Source: Ambulatory Visit | Attending: Cardiovascular Disease | Admitting: Cardiovascular Disease

## 2024-03-15 DIAGNOSIS — R002 Palpitations: Secondary | ICD-10-CM | POA: Diagnosis not present

## 2024-03-15 DIAGNOSIS — I480 Paroxysmal atrial fibrillation: Secondary | ICD-10-CM | POA: Insufficient documentation

## 2024-03-15 MED ORDER — IOHEXOL 350 MG/ML SOLN
100.0000 mL | Freq: Once | INTRAVENOUS | Status: AC | PRN
  Administered 2024-03-15: 100 mL via INTRAVENOUS

## 2024-03-17 ENCOUNTER — Telehealth (HOSPITAL_COMMUNITY): Payer: Self-pay

## 2024-03-17 NOTE — Telephone Encounter (Signed)
 Spoke with patient to complete pre-procedure call.     New medical conditions? No  Recent hospitalizations or surgeries? No Started any new medications? No Patient made aware to contact office to inform of any new medications started. Any changes in activities of daily living? No  Pre-procedure testing scheduled: CT and lab work completed.  Confirmed patient is taking Eliquis  twice daily and will continue taking medication before procedure or it may need to be rescheduled.  Confirmed patient is scheduled for Atrial Fibrillation Ablation on Tuesday, May 13 with Dr. Marlane Silver. Instructed patient to arrive at the Main Entrance A at [facility]: [address] [place], [place] [address] and check in at Admitting at 10:30 AM.  Advised of plan to go home the same day and will only stay overnight if medically necessary. You MUST have a responsible adult to drive you home and MUST be with you the first 24 hours after you arrive home or your procedure could be cancelled.  Patient verbalized understanding to information provided and is agreeable to proceed with procedure.

## 2024-03-21 ENCOUNTER — Ambulatory Visit: Payer: Medicare Other | Admitting: Nurse Practitioner

## 2024-03-22 ENCOUNTER — Encounter: Payer: Self-pay | Admitting: Cardiovascular Disease

## 2024-03-29 ENCOUNTER — Telehealth (HOSPITAL_COMMUNITY): Payer: Self-pay

## 2024-03-29 NOTE — Telephone Encounter (Signed)
 Call placed to patient to discuss upcoming procedure.   CT: completed.  Labs: completed.   Any recent signs of acute illness or been started on antibiotics? No Any new medications started? No Any medications to hold? No Any missed doses of blood thinner? No Advised patient to continue taking ANTICOAGULANT: Eliquis  (Apixaban ) twice daily without missing any doses.  Medication instructions:  On the morning of your procedure DO NOT take any medication., including Eliquis  or the procedure may be rescheduled. Nothing to eat or drink after midnight prior to your procedure.  Confirmed patient is scheduled for Atrial Fibrillation Ablation on Tuesday, May 13 with Dr. Marlane Silver. Instructed patient to arrive at the Main Entrance A at [facility]: [address] [place], [place] [address] and check in at Admitting at 10:30 AM  Advised of plan to go home the same day and will only stay overnight if medically necessary. You MUST have a responsible adult to drive you home and MUST be with you the first 24 hours after you arrive home or your procedure could be cancelled.  Patient verbalized understanding to all instructions provided and agreed to proceed with procedure.

## 2024-04-04 NOTE — Pre-Procedure Instructions (Signed)
 Attempted to call patient regarding instructions for tomorrow.  No answer.  Below are the following instructions: Arrival time 1000 Nothing to eat or drink after midnight No meds AM of procedure Responsible person to drive you home and stay with you for 24 hrs  Have you missed any doses of anti-coagulant Eliquis - should be taken twice a day, if you have missed any doses please let us  know.  Don't take dose morning of procedure.

## 2024-04-05 ENCOUNTER — Ambulatory Visit (HOSPITAL_COMMUNITY): Admitting: Anesthesiology

## 2024-04-05 ENCOUNTER — Encounter (HOSPITAL_COMMUNITY): Payer: Self-pay | Admitting: Cardiovascular Disease

## 2024-04-05 ENCOUNTER — Ambulatory Visit (HOSPITAL_BASED_OUTPATIENT_CLINIC_OR_DEPARTMENT_OTHER): Admitting: Anesthesiology

## 2024-04-05 ENCOUNTER — Other Ambulatory Visit: Payer: Self-pay

## 2024-04-05 ENCOUNTER — Ambulatory Visit (HOSPITAL_COMMUNITY)
Admission: RE | Admit: 2024-04-05 | Discharge: 2024-04-05 | Disposition: A | Discharge status: Home / Self Care | Attending: Cardiovascular Disease | Admitting: Cardiovascular Disease

## 2024-04-05 ENCOUNTER — Encounter (HOSPITAL_COMMUNITY)
Admission: RE | Disposition: A | Discharge status: Home / Self Care | Payer: Self-pay | Source: Home / Self Care | Attending: Cardiovascular Disease

## 2024-04-05 DIAGNOSIS — Z79899 Other long term (current) drug therapy: Secondary | ICD-10-CM | POA: Insufficient documentation

## 2024-04-05 DIAGNOSIS — R002 Palpitations: Secondary | ICD-10-CM | POA: Insufficient documentation

## 2024-04-05 DIAGNOSIS — I10 Essential (primary) hypertension: Secondary | ICD-10-CM | POA: Diagnosis not present

## 2024-04-05 DIAGNOSIS — E039 Hypothyroidism, unspecified: Secondary | ICD-10-CM

## 2024-04-05 DIAGNOSIS — Z7901 Long term (current) use of anticoagulants: Secondary | ICD-10-CM | POA: Diagnosis not present

## 2024-04-05 DIAGNOSIS — I4891 Unspecified atrial fibrillation: Secondary | ICD-10-CM

## 2024-04-05 DIAGNOSIS — I480 Paroxysmal atrial fibrillation: Secondary | ICD-10-CM | POA: Diagnosis not present

## 2024-04-05 DIAGNOSIS — F418 Other specified anxiety disorders: Secondary | ICD-10-CM | POA: Diagnosis not present

## 2024-04-05 DIAGNOSIS — K219 Gastro-esophageal reflux disease without esophagitis: Secondary | ICD-10-CM | POA: Diagnosis not present

## 2024-04-05 DIAGNOSIS — D6869 Other thrombophilia: Secondary | ICD-10-CM | POA: Insufficient documentation

## 2024-04-05 HISTORY — PX: ATRIAL FIBRILLATION ABLATION: EP1191

## 2024-04-05 LAB — POCT ACTIVATED CLOTTING TIME: Activated Clotting Time: 377 s

## 2024-04-05 SURGERY — ATRIAL FIBRILLATION ABLATION
Anesthesia: General

## 2024-04-05 MED ORDER — HEPARIN SODIUM (PORCINE) 1000 UNIT/ML IJ SOLN
INTRAMUSCULAR | Status: DC | PRN
  Administered 2024-04-05: 12000 [IU] via INTRAVENOUS

## 2024-04-05 MED ORDER — SUGAMMADEX SODIUM 200 MG/2ML IV SOLN
INTRAVENOUS | Status: DC | PRN
Start: 2024-04-05 — End: 2024-04-05
  Administered 2024-04-05: 300 mg via INTRAVENOUS

## 2024-04-05 MED ORDER — ACETAMINOPHEN 325 MG PO TABS: 650.0000 mg | ORAL_TABLET | ORAL | Status: DC | PRN

## 2024-04-05 MED ORDER — LIDOCAINE 2% (20 MG/ML) 5 ML SYRINGE
INTRAMUSCULAR | Status: DC | PRN
  Administered 2024-04-05: 80 mg via INTRAVENOUS

## 2024-04-05 MED ORDER — HEPARIN (PORCINE) IN NACL 1000-0.9 UT/500ML-% IV SOLN
INTRAVENOUS | Status: DC | PRN
  Administered 2024-04-05 (×3): 500 mL

## 2024-04-05 MED ORDER — DEXAMETHASONE SODIUM PHOSPHATE 10 MG/ML IJ SOLN
INTRAMUSCULAR | Status: DC | PRN
  Administered 2024-04-05: 5 mg via INTRAVENOUS

## 2024-04-05 MED ORDER — ATROPINE SULFATE 1 MG/ML IV SOLN
INTRAVENOUS | Status: DC | PRN
  Administered 2024-04-05: 1 mg via INTRAVENOUS

## 2024-04-05 MED ORDER — HEPARIN (PORCINE) IN NACL 2000-0.9 UNIT/L-% IV SOLN
INTRAVENOUS | Status: DC | PRN
  Administered 2024-04-05 (×4): 1000 mL

## 2024-04-05 MED ORDER — PROTAMINE SULFATE 10 MG/ML IV SOLN
INTRAVENOUS | Status: DC | PRN
  Administered 2024-04-05: 40 mg via INTRAVENOUS

## 2024-04-05 MED ORDER — PROPOFOL 10 MG/ML IV BOLUS
INTRAVENOUS | Status: DC | PRN
  Administered 2024-04-05: 70 mg via INTRAVENOUS

## 2024-04-05 MED ORDER — SODIUM CHLORIDE 0.9 % IV SOLN: INTRAVENOUS | Status: DC

## 2024-04-05 MED ORDER — ROCURONIUM BROMIDE 10 MG/ML (PF) SYRINGE
PREFILLED_SYRINGE | INTRAVENOUS | Status: DC | PRN
Start: 2024-04-05 — End: 2024-04-05
  Administered 2024-04-05: 30 mg via INTRAVENOUS
  Administered 2024-04-05: 50 mg via INTRAVENOUS

## 2024-04-05 MED ORDER — ONDANSETRON HCL 4 MG/2ML IJ SOLN
INTRAMUSCULAR | Status: DC | PRN
  Administered 2024-04-05: 4 mg via INTRAVENOUS

## 2024-04-05 MED ORDER — ONDANSETRON HCL 4 MG/2ML IJ SOLN
4.0000 mg | Freq: Four times a day (QID) | INTRAMUSCULAR | Status: DC | PRN
Start: 2024-04-05 — End: 2024-04-05

## 2024-04-05 MED ORDER — PROPOFOL 500 MG/50ML IV EMUL
INTRAVENOUS | Status: DC | PRN
  Administered 2024-04-05: 125 ug/kg/min via INTRAVENOUS

## 2024-04-05 SURGICAL SUPPLY — 19 items
BAG SNAP BAND KOVER 36X36 (MISCELLANEOUS) IMPLANT
BLANKET WARM UNDERBOD FULL ACC (MISCELLANEOUS) ×1 IMPLANT
CABLE PFA RX CATH CONN (CABLE) IMPLANT
CATH FARAWAVE ABLATION 31 (CATHETERS) IMPLANT
CATH GE 8FR SOUNDSTAR (CATHETERS) IMPLANT
CATH OCTARAY 2.0 F 3-3-3-3-3 (CATHETERS) IMPLANT
CATH WEBSTER BI DIR CS D-F CRV (CATHETERS) IMPLANT
CLOSURE PERCLOSE PROSTYLE (VASCULAR PRODUCTS) IMPLANT
COVER SWIFTLINK CONNECTOR (BAG) ×1 IMPLANT
DEVICE CLOSURE MYNXGRIP 6/7F (Vascular Products) IMPLANT
DILATOR VESSEL 38 20CM 16FR (INTRODUCER) IMPLANT
GUIDEWIRE INQWIRE 1.5J.035X260 (WIRE) IMPLANT
KIT VERSACROSS CNCT FARADRIVE (KITS) IMPLANT
PACK EP LF (CUSTOM PROCEDURE TRAY) ×1 IMPLANT
PAD DEFIB RADIO PHYSIO CONN (PAD) ×1 IMPLANT
SHEATH AVANTI 11CM 9FR (SHEATH) IMPLANT
SHEATH FARADRIVE STEERABLE (SHEATH) IMPLANT
SHEATH PINNACLE 8F 10CM (SHEATH) IMPLANT
SHEATH PROBE COVER 6X72 (BAG) IMPLANT

## 2024-04-05 NOTE — Anesthesia Preprocedure Evaluation (Addendum)
 Anesthesia Evaluation  Patient identified by MRN, date of birth, ID band Patient awake    Reviewed: Allergy & Precautions, Patient's Chart, lab work & pertinent test results  History of Anesthesia Complications (+) PONV and history of anesthetic complications  Airway Mallampati: II       Dental no notable dental hx.    Pulmonary neg pulmonary ROS   Pulmonary exam normal        Cardiovascular hypertension, + dysrhythmias Atrial Fibrillation + Valvular Problems/Murmurs  Rhythm:Irregular Rate:Normal  Echo:   1. Left ventricular ejection fraction, by estimation, is 60 to 65%. The  left ventricle has normal function. The left ventricle has no regional  wall motion abnormalities. There is mild left ventricular hypertrophy.  Left ventricular diastolic parameters  are consistent with Grade I diastolic dysfunction (impaired relaxation).   2. Right ventricular systolic function is normal. The right ventricular  size is normal. There is normal pulmonary artery systolic pressure. The  estimated right ventricular systolic pressure is 28.4 mmHg.   3. The mitral valve is normal in structure. Trivial mitral valve  regurgitation. No evidence of mitral stenosis.   4. The aortic valve is tricuspid. There is mild calcification of the  aortic valve. Aortic valve regurgitation is not visualized. Aortic valve  sclerosis is present, with no evidence of aortic valve stenosis.   5. The inferior vena cava is normal in size with greater than 50%  respiratory variability, suggesting right atrial pressure of 3 mmHg.     Neuro/Psych  PSYCHIATRIC DISORDERS Anxiety Depression    negative neurological ROS     GI/Hepatic Neg liver ROS,GERD  ,,  Endo/Other  Hypothyroidism    Renal/GU negative Renal ROS     Musculoskeletal  (+) Arthritis ,    Abdominal   Peds  Hematology negative hematology ROS (+)   Anesthesia Other Findings    Reproductive/Obstetrics                             Anesthesia Physical Anesthesia Plan  ASA: 3  Anesthesia Plan: General   Post-op Pain Management: Minimal or no pain anticipated   Induction: Intravenous  PONV Risk Score and Plan: 4 or greater and Ondansetron , Treatment may vary due to age or medical condition, Propofol  infusion and TIVA  Airway Management Planned: Oral ETT  Additional Equipment: None  Intra-op Plan:   Post-operative Plan: Extubation in OR  Informed Consent:   Plan Discussed with: CRNA  Anesthesia Plan Comments:        Anesthesia Quick Evaluation

## 2024-04-05 NOTE — Transfer of Care (Signed)
 Immediate Anesthesia Transfer of Care Note  Patient: [name]  Procedure(s) Performed: ATRIAL FIBRILLATION ABLATION  Patient Location: Cath Lab  Anesthesia Type:General  Level of Consciousness: awake, alert , and oriented  Airway & Oxygen Therapy: Patient Spontanous Breathing  Post-op Assessment: Report given to RN and Post -op Vital signs reviewed and stable  Post vital signs: Reviewed and stable  Last Vitals:  Vitals Value Taken Time  BP    Temp    Pulse    Resp    SpO2      Last Pain:  Vitals:   04/05/24 1116  TempSrc:   PainSc: 0-No pain         Complications: No notable events documented.

## 2024-04-05 NOTE — Discharge Instructions (Signed)

## 2024-04-05 NOTE — Anesthesia Postprocedure Evaluation (Signed)
 Anesthesia Post Note  Patient: [name]  Procedure(s) Performed: ATRIAL FIBRILLATION ABLATION     Patient location during evaluation: PACU Anesthesia Type: General Level of consciousness: awake and alert Pain management: pain level controlled Vital Signs Assessment: post-procedure vital signs reviewed and stable Respiratory status: spontaneous breathing, nonlabored ventilation, respiratory function stable and patient connected to nasal cannula oxygen Cardiovascular status: blood pressure returned to baseline and stable Postop Assessment: no apparent nausea or vomiting Anesthetic complications: no  No notable events documented.  Last Vitals:  Vitals:   04/05/24 1415 04/05/24 1429  BP: 125/63 124/68  Pulse: 83 84  Resp: 18 15  Temp:    SpO2: 98% 98%    Last Pain:  Vitals:   04/05/24 1400  TempSrc: Axillary  PainSc: 0-No pain   Pain Goal:                   Willian Harrow

## 2024-04-05 NOTE — Anesthesia Procedure Notes (Signed)
 Procedure Name: Intubation Date/Time: 04/05/2024 12:21 PM  Performed by: Raymund Calix, CRNAPre-anesthesia Checklist: Patient identified, Emergency Drugs available, Suction available and Patient being monitored Patient Re-evaluated:Patient Re-evaluated prior to induction Oxygen Delivery Method: Circle system utilized Preoxygenation: Pre-oxygenation with 100% oxygen Induction Type: IV induction Ventilation: Mask ventilation without difficulty Laryngoscope Size: Mac and 3 Grade View: Grade II Tube type: Oral Tube size: 7.0 mm Number of attempts: 1 Airway Equipment and Method: Stylet and Oral airway Placement Confirmation: ETT inserted through vocal cords under direct vision, positive ETCO2 and breath sounds checked- equal and bilateral Secured at: 20 cm Tube secured with: Tape Dental Injury: Teeth and Oropharynx as per pre-operative assessment

## 2024-04-05 NOTE — Progress Notes (Signed)
Pt ambulated without difficulty or bleeding.  Discharge instructions given to pt and daughter who verbalize understanding and deny further questions.  Discharged home with  daughter who will drive and stay with pt x 24 hrs.

## 2024-04-05 NOTE — H&P (Signed)
 Electrophysiology Office Note:    Date:  04/05/2024   ID:  [name], DOB 07/23/1946, MRN [id number]  PCP:  Orlena Bitters, MD   [facility] Providers Cardiologist:  Teddie Favre, MD Electrophysiologist:  Efraim Grange, MD     Referring MD: Arlester Ladd Donnamae Gaba, MD   History of Present Illness:    [name] is a 78 y.o. female with a medical history significant for paroxysmal atrial fibrillation, hypothyroid, referred for atrial fibrillation.      I discussed the use of AI scribe software for clinical note transcription with the patient, who gave verbal consent to proceed.  She has a history of atrial fibrillation, first diagnosed in December 2019, confirmed by an EKG. Initially, she was hospitalized and started on Eliquis . Despite initial control, she began experiencing frequent episodes, leading to the addition of flecainide  to her regimen.  Since starting flecainide , the frequency of her atrial fibrillation episodes has decreased, but she still experiences episodes, including one where her heart rate reached 114 bpm. She notes higher blood pressure, facial flushing, and headaches, raising concerns about the effectiveness of flecainide .  She describes a sensation of chest tightness at night when lying down, accompanied by discomfort, though she is uncertain if this is related to her atrial fibrillation.  During a recent knee replacement surgery, she was noted to be in atrial fibrillation throughout the procedure.     Today, she reports that she feels well.  She has no complaints.  Palpitations have improved on flecainide  but still recur.  I reviewed the patient's CT and labs. There was no LAA thrombus. she  has not missed any doses of anticoagulation, and she took her dose last night. There have been no changes in the patient's diagnoses, medications, or condition since our recent clinic visit.   EKGs/Labs/Other Studies Reviewed Today:      Echocardiogram:  TTE January 2025 EF 60 to 65%.  Mild LVH.  Grade 1 diastolic dysfunction.   Monitors:  14 day monitor April 2024-- my interpretation Sinus rhythm.  No atrial fibrillation reported  Stress testing:  Nuclear stress test January 2025 No evidence of ischemia   EKG:         Physical Exam:    VS:  BP (!) 140/64   Pulse 66   Temp 97.6 F (36.4 C) (Oral)   Resp 16   Ht 5\' 2"  (1.575 m)   Wt 74.8 kg   SpO2 93%   BMI 30.18 kg/m     Wt Readings from Last 3 Encounters:  04/05/24 74.8 kg  02/16/24 73.5 kg  01/14/24 75.2 kg     GEN: Well nourished, well developed in no acute distress CARDIAC: RRR, no murmurs, rubs, gallops RESPIRATORY:  Normal work of breathing MUSCULOSKELETAL: no edema    ASSESSMENT & PLAN:     Paroxysmal atrial fibrillation Symptomatic with palpitations, fatigue Having recurrence on flecainide  We discussed management options.  Given that she has failed flecainide  and is symptomatic, I recommended ablation.  Using a shared decision making approach, we decided to proceed with scheduling Continue flecainide  50 mg twice daily, diltiazem  240 mg daily pending ablation  We discussed the indication, rationale, logistics, anticipated benefits, and potential risks of the ablation procedure including but not limited to -- bleed at the groin access site, chest pain, damage to nearby organs such as the diaphragm, lungs, or esophagus, need for a drainage tube, or prolonged hospitalization. I explained that the risk for stroke, heart  attack, need for open chest surgery, or even death is very low but not zero. she  expressed understanding and wishes to proceed.   Secondary hypercoagulable state Continue Eliquis  5 mg twice daily     Signed, Efraim Grange, MD  04/05/2024 11:55 AM    [facility]

## 2024-04-06 ENCOUNTER — Telehealth (HOSPITAL_COMMUNITY): Payer: Self-pay

## 2024-04-06 NOTE — Telephone Encounter (Signed)
 Spoke with patient to complete post procedure follow up call.  Patient reports no complications with groin sites.   Instructions reviewed with patient:  Remove large bandage at puncture site after 24 hours. It is normal to have bruising, tenderness and a pea or marble sized lump/knot at the groin site which can take up to three months to resolve.  Get help right away if you notice sudden swelling at the puncture site.  Check your puncture site every day for signs of infection: fever, redness, swelling, pus drainage, warmth, foul odor or excessive pain. If this occurs, please call the office at [phone], to speak with the nurse. Get help right away if your puncture site is bleeding and the bleeding does not stop after applying firm pressure to the area.  You may continue to have skipped beats/ atrial fibrillation during the first several months after your procedure.  It is very important not to miss any doses of your blood thinner Eliquis . Patient restarted taking this medication on 04/05/24.   You will follow up with the Afib clinic on 05/03/24 and follow up with the Dr. Arlester Ladd on 07/29/24.   Patient verbalized understanding to all instructions provided.

## 2024-05-03 ENCOUNTER — Ambulatory Visit (HOSPITAL_COMMUNITY)
Admission: RE | Admit: 2024-05-03 | Discharge: 2024-05-03 | Disposition: A | Discharge status: Home / Self Care | Source: Ambulatory Visit | Attending: Physician Assistant | Admitting: Physician Assistant

## 2024-05-03 VITALS — BP 136/68 | HR 64 | Ht 62.0 in | Wt 166.6 lb

## 2024-05-03 DIAGNOSIS — D6869 Other thrombophilia: Secondary | ICD-10-CM | POA: Diagnosis not present

## 2024-05-03 DIAGNOSIS — I480 Paroxysmal atrial fibrillation: Secondary | ICD-10-CM | POA: Diagnosis not present

## 2024-05-03 DIAGNOSIS — I4891 Unspecified atrial fibrillation: Secondary | ICD-10-CM | POA: Diagnosis not present

## 2024-05-03 NOTE — Progress Notes (Signed)
 Primary Care Physician: Orlena Bitters, MD Primary Cardiologist: Teddie Favre, MD Electrophysiologist: Efraim Grange, MD  Referring Physician: Dr Arlester Ladd   [name] is a 78 y.o. female with a history of HTN, HLD, hypothyroidism, atrial fibrillation who presents for follow up in the [facility].  The patient was initially diagnosed with atrial fibrillation 10/2018. She had been maintained on flecainide  but continued to have episodes. She was seen by Dr Arlester Ladd and underwent afib ablation with Dr Arlester Ladd on 04/05/24. Patient is on Eliquis  for stroke prevention.    Patient presents today for follow up for atrial fibrillation. She reports that since the ablation she has felt better than she has "in a long time." She has not had any palpitations. No chest pain or groin issues. No bleeding issues on anticoagulation.   Today, she denies symptoms of palpitations, chest pain, shortness of breath, orthopnea, PND, lower extremity edema, dizziness, presyncope, syncope, snoring, daytime somnolence, bleeding, or neurologic sequela. The patient is tolerating medications without difficulties and is otherwise without complaint today.    Atrial Fibrillation Risk Factors:  she does not have symptoms or diagnosis of sleep apnea. she does not have a history of rheumatic fever.   Atrial Fibrillation Management history:  Previous antiarrhythmic drugs: flecainide   Previous cardioversions: none Previous ablations: 04/05/24 Anticoagulation history: Eliquis   ROS- All systems are reviewed and negative except as per the HPI above.  Past Medical History:  Diagnosis Date   Anxiety    Arthritis    Atrial fibrillation [facility])    Documented December 2019   Complication of anesthesia    slow to wake up   Depression    GERD (gastroesophageal reflux disease)    Heart murmur    Hyperlipidemia    Hypertension    Hypothyroidism    PONV (postoperative nausea and vomiting)      Current Outpatient Medications  Medication Sig Dispense Refill   acetaminophen  (TYLENOL ) 500 MG tablet Take 1,000 mg by mouth as needed (pain.).     ALPRAZolam  (XANAX ) 0.25 MG tablet Take 0.25 mg by mouth at bedtime as needed for sleep.     apixaban  (ELIQUIS ) 5 MG TABS tablet Take 1 tablet (5 mg total) by mouth 2 (two) times daily. 28 tablet 0   Cholecalciferol  (VITAMIN D3) 50 MCG (2000 UT) capsule Take 4,000 Units by mouth daily.     citalopram  (CELEXA ) 20 MG tablet Take 20 mg by mouth daily.     diltiazem  (CARDIZEM  CD) 240 MG 24 hr capsule Take 1 capsule (240 mg total) by mouth daily. 90 capsule 1   Lactobacillus-Inulin (PROBIOTIC DIGESTIVE SUPPORT) CAPS Take 1 capsule by mouth daily.     levothyroxine  (SYNTHROID , LEVOTHROID) 75 MCG tablet Take 75 mcg by mouth daily before breakfast.      MAGNESIUM  PO Taking 1 chewable by mouth daily     metoprolol  tartrate (LOPRESSOR ) 25 MG tablet TAKE 1 TABLET BY MOUTH EVERY 8  HOURS AS NEEDED FOR PALPITATIONS 100 tablet 2   Multiple Vitamins-Minerals (MULTIVITAMIN WITH MINERALS) tablet Take 1 tablet by mouth daily. One A Day for Women     olopatadine  (PATADAY ) 0.1 % ophthalmic solution Place 1 drop into both eyes daily.     polyethylene glycol (MIRALAX  / GLYCOLAX ) 17 g packet Take 17 g by mouth daily as needed for moderate constipation.     rosuvastatin  (CRESTOR ) 5 MG tablet Take 1 tablet (5 mg total) by mouth daily. 90 tablet 1  spironolactone  (ALDACTONE ) 25 MG tablet TAKE 1 TABLET BY MOUTH  DAILY 90 tablet 1   No current facility-administered medications for this encounter.    Physical Exam: BP 136/68   Pulse 64   Ht 5\' 2"  (1.575 m)   Wt 75.6 kg   BMI 30.47 kg/m   GEN: Well nourished, well developed in no acute distress CARDIAC: Regular rate and rhythm, no murmurs, rubs, gallops RESPIRATORY:  Clear to auscultation without rales, wheezing or rhonchi  ABDOMEN: Soft, non-tender, non-distended EXTREMITIES:  No edema; No deformity   Wt  Readings from Last 3 Encounters:  05/03/24 75.6 kg  04/05/24 74.8 kg  02/16/24 73.5 kg     EKG today demonstrates  SR, 1st degree AV block Vent. rate 64 BPM PR interval 210 ms QRS duration 104 ms QT/QTcB 384/396 ms   Echo 12/17/23 demonstrated   1. Left ventricular ejection fraction, by estimation, is 60 to 65%. The  left ventricle has normal function. The left ventricle has no regional  wall motion abnormalities. There is mild left ventricular hypertrophy.  Left ventricular diastolic parameters are consistent with Grade I diastolic dysfunction (impaired relaxation).   2. Right ventricular systolic function is normal. The right ventricular  size is normal. There is normal pulmonary artery systolic pressure. The  estimated right ventricular systolic pressure is 28.4 mmHg.   3. The mitral valve is normal in structure. Trivial mitral valve  regurgitation. No evidence of mitral stenosis.   4. The aortic valve is tricuspid. There is mild calcification of the  aortic valve. Aortic valve regurgitation is not visualized. Aortic valve  sclerosis is present, with no evidence of aortic valve stenosis.   5. The inferior vena cava is normal in size with greater than 50%  respiratory variability, suggesting right atrial pressure of 3 mmHg.     CHA2DS2-VASc Score = 4  The patient's score is based upon: CHF History: 0 HTN History: 1 Diabetes History: 0 Stroke History: 0 Vascular Disease History: 0 Age Score: 2 Gender Score: 1       ASSESSMENT AND PLAN: Paroxysmal Atrial Fibrillation (ICD10:  I48.0) The patient's CHA2DS2-VASc score is 4, indicating a 4.8% annual risk of stroke.   S/p afib ablation 04/05/24 Patient appears to be maintaining SR Continue Eliquis  5 mg BID with no missed doses for 3 months post ablation. Continue diltiazem  240 mg daily Continue Lopressor  25 mg PRN TID for heart racing  Secondary Hypercoagulable State (ICD10:  D68.69) The patient is at significant risk  for stroke/thromboembolism based upon her CHA2DS2-VASc Score of 4.  Continue Apixaban  (Eliquis ). No bleeding issues.   HTN Stable on current regimen    Follow up with Dr Arlester Ladd as scheduled.        [facility] [address] [place], [place] [address] [phone]

## 2024-05-12 DIAGNOSIS — I4891 Unspecified atrial fibrillation: Secondary | ICD-10-CM | POA: Diagnosis not present

## 2024-05-12 DIAGNOSIS — Z0000 Encounter for general adult medical examination without abnormal findings: Secondary | ICD-10-CM | POA: Diagnosis not present

## 2024-05-12 DIAGNOSIS — Z299 Encounter for prophylactic measures, unspecified: Secondary | ICD-10-CM | POA: Diagnosis not present

## 2024-05-12 DIAGNOSIS — I10 Essential (primary) hypertension: Secondary | ICD-10-CM | POA: Diagnosis not present

## 2024-05-15 ENCOUNTER — Other Ambulatory Visit: Payer: Self-pay | Admitting: Cardiology

## 2024-05-18 ENCOUNTER — Encounter (INDEPENDENT_AMBULATORY_CARE_PROVIDER_SITE_OTHER): Payer: Self-pay | Admitting: *Deleted

## 2024-05-19 DIAGNOSIS — N390 Urinary tract infection, site not specified: Secondary | ICD-10-CM | POA: Diagnosis not present

## 2024-05-19 DIAGNOSIS — I10 Essential (primary) hypertension: Secondary | ICD-10-CM | POA: Diagnosis not present

## 2024-05-19 DIAGNOSIS — I4891 Unspecified atrial fibrillation: Secondary | ICD-10-CM | POA: Diagnosis not present

## 2024-05-19 DIAGNOSIS — R319 Hematuria, unspecified: Secondary | ICD-10-CM | POA: Diagnosis not present

## 2024-05-19 DIAGNOSIS — Z299 Encounter for prophylactic measures, unspecified: Secondary | ICD-10-CM | POA: Diagnosis not present

## 2024-06-01 ENCOUNTER — Encounter (HOSPITAL_COMMUNITY): Payer: Self-pay

## 2024-06-01 ENCOUNTER — Emergency Department (HOSPITAL_COMMUNITY)

## 2024-06-01 ENCOUNTER — Encounter: Payer: Self-pay | Admitting: Cardiovascular Disease

## 2024-06-01 ENCOUNTER — Other Ambulatory Visit: Payer: Self-pay

## 2024-06-01 ENCOUNTER — Emergency Department (HOSPITAL_COMMUNITY)
Admission: EM | Admit: 2024-06-01 | Discharge: 2024-06-01 | Disposition: A | Discharge status: Home / Self Care | Attending: Emergency Medicine | Admitting: Emergency Medicine

## 2024-06-01 DIAGNOSIS — N201 Calculus of ureter: Secondary | ICD-10-CM

## 2024-06-01 DIAGNOSIS — Z79899 Other long term (current) drug therapy: Secondary | ICD-10-CM | POA: Insufficient documentation

## 2024-06-01 DIAGNOSIS — Z91040 Latex allergy status: Secondary | ICD-10-CM | POA: Diagnosis not present

## 2024-06-01 DIAGNOSIS — I10 Essential (primary) hypertension: Secondary | ICD-10-CM | POA: Insufficient documentation

## 2024-06-01 DIAGNOSIS — N202 Calculus of kidney with calculus of ureter: Secondary | ICD-10-CM | POA: Diagnosis not present

## 2024-06-01 DIAGNOSIS — Z7901 Long term (current) use of anticoagulants: Secondary | ICD-10-CM | POA: Insufficient documentation

## 2024-06-01 DIAGNOSIS — K5730 Diverticulosis of large intestine without perforation or abscess without bleeding: Secondary | ICD-10-CM | POA: Diagnosis not present

## 2024-06-01 DIAGNOSIS — N390 Urinary tract infection, site not specified: Secondary | ICD-10-CM | POA: Diagnosis not present

## 2024-06-01 DIAGNOSIS — R6889 Other general symptoms and signs: Secondary | ICD-10-CM | POA: Diagnosis not present

## 2024-06-01 DIAGNOSIS — R350 Frequency of micturition: Secondary | ICD-10-CM | POA: Diagnosis not present

## 2024-06-01 DIAGNOSIS — R109 Unspecified abdominal pain: Secondary | ICD-10-CM | POA: Diagnosis not present

## 2024-06-01 DIAGNOSIS — N139 Obstructive and reflux uropathy, unspecified: Secondary | ICD-10-CM | POA: Diagnosis not present

## 2024-06-01 DIAGNOSIS — Z299 Encounter for prophylactic measures, unspecified: Secondary | ICD-10-CM | POA: Diagnosis not present

## 2024-06-01 DIAGNOSIS — R52 Pain, unspecified: Secondary | ICD-10-CM | POA: Diagnosis not present

## 2024-06-01 LAB — URINALYSIS, ROUTINE W REFLEX MICROSCOPIC
Bilirubin Urine: NEGATIVE
Glucose, UA: NEGATIVE mg/dL
Ketones, ur: NEGATIVE mg/dL
Leukocytes,Ua: NEGATIVE
Nitrite: NEGATIVE
Protein, ur: 100 mg/dL — AB
RBC / HPF: >50 RBC/hpf (ref 0–5)
Specific Gravity, Urine: 1.013 (ref 1.005–1.030)
pH: 6 (ref 5.0–8.0)

## 2024-06-01 LAB — CBC WITH DIFFERENTIAL/PLATELET
Abs Immature Granulocytes: 0.03 K/uL (ref 0.00–0.07)
Basophils Absolute: 0.1 K/uL (ref 0.0–0.1)
Basophils Relative: 1 %
Eosinophils Absolute: 0.1 K/uL (ref 0.0–0.5)
Eosinophils Relative: 1 %
HCT: 49.9 % — ABNORMAL HIGH (ref 36.0–46.0)
Hemoglobin: 16.2 g/dL — ABNORMAL HIGH (ref 12.0–15.0)
Immature Granulocytes: 0 %
Lymphocytes Relative: 22 %
Lymphs Abs: 2.2 K/uL (ref 0.7–4.0)
MCH: 30.7 pg (ref 26.0–34.0)
MCHC: 32.5 g/dL (ref 30.0–36.0)
MCV: 94.7 fL (ref 80.0–100.0)
Monocytes Absolute: 1.2 K/uL — ABNORMAL HIGH (ref 0.1–1.0)
Monocytes Relative: 13 %
Neutro Abs: 6.2 K/uL (ref 1.7–7.7)
Neutrophils Relative %: 63 %
Platelets: 307 K/uL (ref 150–400)
RBC: 5.27 MIL/uL — ABNORMAL HIGH (ref 3.87–5.11)
RDW: 13 % (ref 11.5–15.5)
WBC: 9.8 K/uL (ref 4.0–10.5)
nRBC: 0 % (ref 0.0–0.2)

## 2024-06-01 LAB — COMPREHENSIVE METABOLIC PANEL WITH GFR
ALT: 22 U/L (ref 0–44)
AST: 30 U/L (ref 15–41)
Albumin: 4.3 g/dL (ref 3.5–5.0)
Alkaline Phosphatase: 96 U/L (ref 38–126)
Anion gap: 13 (ref 5–15)
BUN: 16 mg/dL (ref 8–23)
CO2: 23 mmol/L (ref 22–32)
Calcium: 9.9 mg/dL (ref 8.9–10.3)
Chloride: 99 mmol/L (ref 98–111)
Creatinine, Ser: 0.88 mg/dL (ref 0.44–1.00)
GFR, Estimated: >60 mL/min (ref >60)
Glucose, Bld: 111 mg/dL — ABNORMAL HIGH (ref 70–99)
Potassium: 3.8 mmol/L (ref 3.5–5.1)
Sodium: 135 mmol/L (ref 135–145)
Total Bilirubin: 0.9 mg/dL (ref 0.0–1.2)
Total Protein: 8 g/dL (ref 6.5–8.1)

## 2024-06-01 MED ORDER — ONDANSETRON HCL 4 MG/2ML IJ SOLN
4.0000 mg | Freq: Once | INTRAMUSCULAR | Status: AC
Start: 2024-06-01 — End: 2024-06-01
  Administered 2024-06-01: 4 mg via INTRAVENOUS
  Filled 2024-06-01: qty 2

## 2024-06-01 MED ORDER — KETOROLAC TROMETHAMINE 15 MG/ML IJ SOLN
15.0000 mg | Freq: Once | INTRAMUSCULAR | Status: AC
Start: 2024-06-01 — End: 2024-06-01
  Administered 2024-06-01: 15 mg via INTRAVENOUS
  Filled 2024-06-01: qty 1

## 2024-06-01 MED ORDER — TAMSULOSIN HCL 0.4 MG PO CAPS: 0.4000 mg | ORAL_CAPSULE | Freq: Every day | ORAL | 0 refills | Status: DC

## 2024-06-01 MED ORDER — SODIUM CHLORIDE 0.9 % IV BOLUS
500.0000 mL | Freq: Once | INTRAVENOUS | Status: AC
  Administered 2024-06-01: 500 mL via INTRAVENOUS

## 2024-06-01 MED ORDER — ONDANSETRON HCL 4 MG PO TABS: 4.0000 mg | ORAL_TABLET | Freq: Four times a day (QID) | ORAL | 0 refills | Status: DC

## 2024-06-01 MED ORDER — ONDANSETRON HCL 4 MG PO TABS: 4.0000 mg | ORAL_TABLET | Freq: Three times a day (TID) | ORAL | 0 refills | Status: DC | PRN

## 2024-06-01 MED ORDER — HYDROCODONE-ACETAMINOPHEN 5-325 MG PO TABS: 2.0000 | ORAL_TABLET | ORAL | 0 refills | Status: DC | PRN

## 2024-06-01 MED ORDER — HYDROCODONE-ACETAMINOPHEN 5-325 MG PO TABS: ORAL_TABLET | ORAL | 0 refills | Status: DC

## 2024-06-01 MED ORDER — FENTANYL CITRATE PF 50 MCG/ML IJ SOSY
25.0000 ug | PREFILLED_SYRINGE | Freq: Once | INTRAMUSCULAR | Status: AC
  Administered 2024-06-01: 25 ug via INTRAVENOUS
  Filled 2024-06-01: qty 1

## 2024-06-01 NOTE — ED Notes (Signed)
 The patient is requesting more nausea and pain meds. Celeste, PA informed.

## 2024-06-01 NOTE — ED Provider Notes (Signed)
   Patient signed out to me by Sherran Barks, PA-C pending urinalysis results and consultation with urology   Patient here with history of UTI, has completed 3-day course of Cipro with no relief was then placed on Macrobid and now taking Levaquin that she started today.  Continues to have hematuria.  CT was ordered and patient found to have 3 mm obstructive uropathy in the proximal left ureter  See previous provider note for complete H&P   On my exam, patient is resting comfortably.  She denies any pain or nausea at present.  I have consulted with urology, Dr. Elisabeth and discussed findings.  She recommends patient to continue her Levaquin as directed and outpatient follow-up with urology regarding the stone.  Urine culture is pending  Patient is agreeable to plan.  Will dispense prepack for Zofran  and pain medication and prescription for Flomax   Patient agreeable to close follow-up with [facility], prefers to be seen in [facility]   [name], [name] 06/01/24 2007    Albertina Dixon, MD 06/02/24 1743

## 2024-06-01 NOTE — ED Notes (Signed)
..  The patient is A&OX4, ambulatory at d/c with independent steady gait but wheeled out of ED via wheelchair, NAD. Pt verbalized understanding of d/c instructions, prescriptions and follow up care. Pt accompanied by family at discharge.

## 2024-06-01 NOTE — ED Triage Notes (Signed)
 Patient initially diagnosed with UTI on 6/23 given cipro went back was then put on macrobid for a week and went back today and has a lot of blood in urine and having left flank pain and vomiting, nausea.

## 2024-06-01 NOTE — ED Notes (Signed)
 Patient transported to CT

## 2024-06-01 NOTE — ED Provider Notes (Signed)
 [place] EMERGENCY DEPARTMENT AT [facility] Provider Note   CSN: [id number] Arrival date & time: 06/01/24  1631     Patient presents with: Flank Pain   [name] is a 78 y.o. female.  The ER today complaining of severe left flank pain with nausea and vomiting.  She is history of hypertension, high cholesterol, osteoporosis, A-fib.  Having urinary symptoms on 6/23, she was on 3 days of Cipro, resolving symptoms so was put on 7 days of Macrobid and today went back because of blood in the urine and flank pain and was started on Levaquin and has had only 1 dose of this.    Flank Pain       Prior to Admission medications   Medication Sig Start Date End Date Taking? Authorizing Provider  acetaminophen  (TYLENOL ) 500 MG tablet Take 1,000 mg by mouth as needed (pain.).    [provider]  ALPRAZolam  (XANAX ) 0.25 MG tablet Take 0.25 mg by mouth at bedtime as needed for sleep.    [provider]  apixaban  (ELIQUIS ) 5 MG TABS tablet Take 1 tablet (5 mg total) by mouth 2 (two) times daily. 07/30/22   Debera Jayson MATSU, MD  Cholecalciferol  (VITAMIN D3) 50 MCG (2000 UT) capsule Take 4,000 Units by mouth daily.    [provider]  citalopram  (CELEXA ) 20 MG tablet Take 20 mg by mouth daily. 12/07/21   [provider]  diltiazem  (CARDIZEM  CD) 240 MG 24 hr capsule Take 1 capsule (240 mg total) by mouth daily. 02/10/23   Debera Jayson MATSU, MD  Lactobacillus-Inulin (PROBIOTIC DIGESTIVE SUPPORT) CAPS Take 1 capsule by mouth daily.    [provider]  levothyroxine  (SYNTHROID , LEVOTHROID) 75 MCG tablet Take 75 mcg by mouth daily before breakfast.     [provider]  MAGNESIUM  PO Taking 1 chewable by mouth daily    [provider]  metoprolol  tartrate (LOPRESSOR ) 25 MG tablet TAKE 1 TABLET BY MOUTH EVERY 8  HOURS AS NEEDED FOR PALPITATIONS 09/17/23   Miriam Norris, NP  Multiple Vitamins-Minerals (MULTIVITAMIN WITH MINERALS)  tablet Take 1 tablet by mouth daily. One A Day for Women    [provider]  olopatadine  (PATADAY ) 0.1 % ophthalmic solution Place 1 drop into both eyes daily.    [provider]  polyethylene glycol (MIRALAX  / GLYCOLAX ) 17 g packet Take 17 g by mouth daily as needed for moderate constipation.    [provider]  rosuvastatin  (CRESTOR ) 5 MG tablet TAKE 1 TABLET BY MOUTH DAILY 05/16/24   Debera Jayson MATSU, MD  spironolactone  (ALDACTONE ) 25 MG tablet TAKE 1 TABLET BY MOUTH  DAILY 08/06/20   Debera Jayson MATSU, MD    Allergies: Lexiscan  [regadenoson ], Other, Latex, Penicillins, Sulfa antibiotics, and Tramadol    Review of Systems  Genitourinary:  Positive for flank pain.    Updated Vital Signs BP (!) 168/82   Pulse 63   Temp 98.6 F (37 C)   Resp 14   Ht 5' 2 (1.575 m)   Wt 75.3 kg   SpO2 97%   BMI 30.36 kg/m   Physical Exam Vitals and nursing note reviewed.  Constitutional:      General: She is not in acute distress.    Appearance: She is well-developed.  HENT:     Head: Normocephalic and atraumatic.  Eyes:     Conjunctiva/sclera: Conjunctivae normal.  Cardiovascular:     Rate and Rhythm: Normal rate and regular rhythm.  Heart sounds: No murmur heard. Pulmonary:     Effort: Pulmonary effort is normal. No respiratory distress.     Breath sounds: Normal breath sounds.  Abdominal:     Palpations: Abdomen is soft.     Tenderness: There is no abdominal tenderness.  Musculoskeletal:        General: No swelling.     Cervical back: Neck supple.  Skin:    General: Skin is warm and dry.     Capillary Refill: Capillary refill takes less than 2 seconds.  Neurological:     General: No focal deficit present.     Mental Status: She is alert and oriented to person, place, and time.  Psychiatric:        Mood and Affect: Mood normal.     (all labs ordered are listed, but only abnormal results are displayed) Labs Reviewed  CBC WITH  DIFFERENTIAL/PLATELET - Abnormal; Notable for the following components:      Result Value   RBC 5.27 (*)    Hemoglobin 16.2 (*)    HCT 49.9 (*)    Monocytes Absolute 1.2 (*)    All other components within normal limits  COMPREHENSIVE METABOLIC PANEL WITH GFR - Abnormal; Notable for the following components:   Glucose, Bld 111 (*)    All other components within normal limits  URINALYSIS, ROUTINE W REFLEX MICROSCOPIC    EKG: None  Radiology: CT Renal Stone Study Result Date: 06/01/2024 CLINICAL DATA:  Recent diagnosis of UTI with hematuria and left flank pain. Nausea vomiting. EXAM: CT ABDOMEN AND PELVIS WITHOUT CONTRAST TECHNIQUE: Multidetector CT imaging of the abdomen and pelvis was performed following the standard protocol without IV contrast. RADIATION DOSE REDUCTION: This exam was performed according to the departmental dose-optimization program which includes automated exposure control, adjustment of the mA and/or kV according to patient size and/or use of iterative reconstruction technique. COMPARISON:  None Available. FINDINGS: Lower chest: No acute abnormality. Hepatobiliary: No focal liver abnormality is seen. No gallstones, gallbladder wall thickening, or biliary dilatation. Pancreas: Unremarkable. No pancreatic ductal dilatation or surrounding inflammatory changes. Spleen: Normal in size without focal abnormality. Adrenals/Urinary Tract: The adrenal glands are within normal limits. A punctate calculus is noted in the left kidney. Parapelvic cysts are present bilaterally. Moderate there is hyper mild-to-moderate hydro nephrosis on the left with a 3 mm calculus in the proximal left ureter. The bladder is unremarkable. Stomach/Bowel: There is a small hiatal hernia distended with ingested debris. The stomach is otherwise within normal limits. No bowel obstruction, free air, or pneumatosis is seen. Scattered diverticula are present along the colon without evidence of diverticulitis. The  appendix is not seen. Vascular/Lymphatic: Aortic atherosclerosis. No enlarged abdominal or pelvic lymph nodes. Reproductive: Status post hysterectomy. No adnexal masses. Other: No abdominopelvic ascites. There is a fat containing umbilical hernia and small fat containing inguinal hernias bilaterally. Musculoskeletal: Degenerative changes are present in the thoracolumbar spine. There is a mild compression deformity in the superior endplate at L1. IMPRESSION: 1. Mild to moderate obstructive uropathy on the left with a 3 mm calculus in the proximal left ureter. 2. Nonobstructive left renal calculus. 3. Small hiatal hernia containing ingested debris. 4. Diverticulosis without diverticulitis. 5. Mild compression deformity in the superior endplate of L1, indeterminate in age. 6. Aortic atherosclerosis. Electronically Signed   By: Leita Birmingham M.D.   On: 06/01/2024 18:30     Procedures   Medications Ordered in the ED  fentaNYL  (SUBLIMAZE ) injection 25 mcg (25 mcg Intravenous Given  06/01/24 1737)  ondansetron  (ZOFRAN ) injection 4 mg (4 mg Intravenous Given 06/01/24 1737)                                    Medical Decision Making Differential diagnosis includes but not limited to ureterolithiasis, pyelonephritis, cystitis, sepsis, electrolyte disturbance, other  ED course: Patient presents to the ER for evaluation of left flank it started today, she has been dealing with UTI since June 23, has already had 3 days of Cipro and a week of Macrobid without relief.  She went back today because of the severe left flank pain.  PCP order urinalysis which showed blood in the urine and started her on Levaquin and told her to come back to the ER.  Labs are reassuring, UA shows large amount of blood, no white blood cells and few bacteria, the patient is already being treated with antibiotics.  CT shows 3 mm proximal left ureteral stone.  Patient and family are hesitant to go home, she did not get relief with fentanyl   but got relief with Toradol .  Since she lives alone they are concerned so discussed will call urology to ensure they are okay with continuance of the levofloxacin on outpatient basis and close follow-up.  Pending consultation with urology, signed out to [facility], PA-C who will discuss the case with them.  Amount and/or Complexity of Data Reviewed Labs: ordered. Radiology: ordered and independent interpretation performed.    Details: Mild to moderate obstructive uropathy on the left with 2 mm calculus in proximal left ureter, I agree with radiology read  Risk Prescription drug management.        Final diagnoses:  None    ED Discharge Orders     None          [name] 06/01/24 1914    Albertina Dixon, MD 06/02/24 1743

## 2024-06-01 NOTE — Discharge Instructions (Addendum)
 Continue taking your Levaquin as directed.  You have been prescribed medication to help with passage of the stone and for nausea and pain.  The pain medication and Flomax  prescribed to you can cause drowsiness, please do not operate machinery or drive while taking the medication.  Call the clinic listed tomorrow to arrange follow-up appointment.  Return to the emergency department for any new or worsening symptoms.

## 2024-06-02 LAB — URINE CULTURE

## 2024-06-03 MED FILL — Hydrocodone-Acetaminophen Tab 5-325 MG: ORAL | Qty: 6 | Status: AC

## 2024-06-03 MED FILL — Ondansetron HCl Tab 4 MG: ORAL | Qty: 4 | Status: AC

## 2024-06-06 DIAGNOSIS — N200 Calculus of kidney: Secondary | ICD-10-CM | POA: Diagnosis not present

## 2024-06-06 DIAGNOSIS — I10 Essential (primary) hypertension: Secondary | ICD-10-CM | POA: Diagnosis not present

## 2024-06-07 ENCOUNTER — Encounter (HOSPITAL_COMMUNITY): Payer: Self-pay

## 2024-06-07 ENCOUNTER — Other Ambulatory Visit: Payer: Self-pay

## 2024-06-07 ENCOUNTER — Inpatient Hospital Stay (HOSPITAL_COMMUNITY)
Admission: EM | Admit: 2024-06-07 | Discharge: 2024-06-10 | DRG: 661 | Disposition: A | Discharge status: Home / Self Care | Attending: Family Medicine | Admitting: Family Medicine

## 2024-06-07 ENCOUNTER — Emergency Department (HOSPITAL_COMMUNITY)

## 2024-06-07 DIAGNOSIS — N200 Calculus of kidney: Secondary | ICD-10-CM

## 2024-06-07 DIAGNOSIS — Z79890 Hormone replacement therapy (postmenopausal): Secondary | ICD-10-CM

## 2024-06-07 DIAGNOSIS — Z79899 Other long term (current) drug therapy: Secondary | ICD-10-CM

## 2024-06-07 DIAGNOSIS — Z880 Allergy status to penicillin: Secondary | ICD-10-CM

## 2024-06-07 DIAGNOSIS — E7800 Pure hypercholesterolemia, unspecified: Secondary | ICD-10-CM | POA: Diagnosis not present

## 2024-06-07 DIAGNOSIS — Z91041 Radiographic dye allergy status: Secondary | ICD-10-CM

## 2024-06-07 DIAGNOSIS — Z90710 Acquired absence of both cervix and uterus: Secondary | ICD-10-CM

## 2024-06-07 DIAGNOSIS — Z96612 Presence of left artificial shoulder joint: Secondary | ICD-10-CM | POA: Diagnosis present

## 2024-06-07 DIAGNOSIS — K219 Gastro-esophageal reflux disease without esophagitis: Secondary | ICD-10-CM | POA: Diagnosis not present

## 2024-06-07 DIAGNOSIS — Z8249 Family history of ischemic heart disease and other diseases of the circulatory system: Secondary | ICD-10-CM

## 2024-06-07 DIAGNOSIS — Z96611 Presence of right artificial shoulder joint: Secondary | ICD-10-CM | POA: Diagnosis present

## 2024-06-07 DIAGNOSIS — Z860100 Personal history of colon polyps, unspecified: Secondary | ICD-10-CM

## 2024-06-07 DIAGNOSIS — R079 Chest pain, unspecified: Secondary | ICD-10-CM | POA: Diagnosis not present

## 2024-06-07 DIAGNOSIS — Z87442 Personal history of urinary calculi: Secondary | ICD-10-CM | POA: Diagnosis not present

## 2024-06-07 DIAGNOSIS — Z96653 Presence of artificial knee joint, bilateral: Secondary | ICD-10-CM | POA: Diagnosis present

## 2024-06-07 DIAGNOSIS — Z7901 Long term (current) use of anticoagulants: Secondary | ICD-10-CM | POA: Diagnosis not present

## 2024-06-07 DIAGNOSIS — I4891 Unspecified atrial fibrillation: Secondary | ICD-10-CM | POA: Diagnosis not present

## 2024-06-07 DIAGNOSIS — N179 Acute kidney failure, unspecified: Secondary | ICD-10-CM | POA: Diagnosis present

## 2024-06-07 DIAGNOSIS — Z91040 Latex allergy status: Secondary | ICD-10-CM | POA: Diagnosis not present

## 2024-06-07 DIAGNOSIS — I10 Essential (primary) hypertension: Secondary | ICD-10-CM | POA: Diagnosis not present

## 2024-06-07 DIAGNOSIS — N132 Hydronephrosis with renal and ureteral calculous obstruction: Secondary | ICD-10-CM | POA: Diagnosis not present

## 2024-06-07 DIAGNOSIS — E890 Postprocedural hypothyroidism: Secondary | ICD-10-CM | POA: Diagnosis not present

## 2024-06-07 DIAGNOSIS — R109 Unspecified abdominal pain: Secondary | ICD-10-CM | POA: Diagnosis not present

## 2024-06-07 DIAGNOSIS — R319 Hematuria, unspecified: Secondary | ICD-10-CM | POA: Diagnosis not present

## 2024-06-07 DIAGNOSIS — M1990 Unspecified osteoarthritis, unspecified site: Secondary | ICD-10-CM | POA: Diagnosis not present

## 2024-06-07 DIAGNOSIS — N23 Unspecified renal colic: Principal | ICD-10-CM

## 2024-06-07 DIAGNOSIS — Z885 Allergy status to narcotic agent status: Secondary | ICD-10-CM

## 2024-06-07 DIAGNOSIS — Z882 Allergy status to sulfonamides status: Secondary | ICD-10-CM | POA: Diagnosis not present

## 2024-06-07 DIAGNOSIS — I700 Atherosclerosis of aorta: Secondary | ICD-10-CM | POA: Diagnosis not present

## 2024-06-07 DIAGNOSIS — N201 Calculus of ureter: Secondary | ICD-10-CM | POA: Diagnosis not present

## 2024-06-07 DIAGNOSIS — E039 Hypothyroidism, unspecified: Secondary | ICD-10-CM | POA: Insufficient documentation

## 2024-06-07 LAB — URINALYSIS, ROUTINE W REFLEX MICROSCOPIC
Bacteria, UA: NONE SEEN
Glucose, UA: NEGATIVE mg/dL
Ketones, ur: 5 mg/dL — AB
Leukocytes,Ua: NEGATIVE
Nitrite: NEGATIVE
Protein, ur: 100 mg/dL — AB
RBC / HPF: >50 RBC/hpf (ref 0–5)
Specific Gravity, Urine: 1.032 — ABNORMAL HIGH (ref 1.005–1.030)
WBC, UA: >50 WBC/hpf (ref 0–5)
pH: 5 (ref 5.0–8.0)

## 2024-06-07 LAB — CBC WITH DIFFERENTIAL/PLATELET
Abs Immature Granulocytes: 0.04 K/uL (ref 0.00–0.07)
Basophils Absolute: 0.1 K/uL (ref 0.0–0.1)
Basophils Relative: 1 %
Eosinophils Absolute: 0 K/uL (ref 0.0–0.5)
Eosinophils Relative: 0 %
HCT: 41.8 % (ref 36.0–46.0)
Hemoglobin: 14.1 g/dL (ref 12.0–15.0)
Immature Granulocytes: 0 %
Lymphocytes Relative: 11 %
Lymphs Abs: 1.3 K/uL (ref 0.7–4.0)
MCH: 31.8 pg (ref 26.0–34.0)
MCHC: 33.7 g/dL (ref 30.0–36.0)
MCV: 94.4 fL (ref 80.0–100.0)
Monocytes Absolute: 2.3 K/uL — ABNORMAL HIGH (ref 0.1–1.0)
Monocytes Relative: 20 %
Neutro Abs: 7.7 K/uL (ref 1.7–7.7)
Neutrophils Relative %: 68 %
Platelets: 268 K/uL (ref 150–400)
RBC: 4.43 MIL/uL (ref 3.87–5.11)
RDW: 12.6 % (ref 11.5–15.5)
WBC: 11.4 K/uL — ABNORMAL HIGH (ref 4.0–10.5)
nRBC: 0 % (ref 0.0–0.2)

## 2024-06-07 LAB — COMPREHENSIVE METABOLIC PANEL WITH GFR
ALT: 23 U/L (ref 0–44)
AST: 31 U/L (ref 15–41)
Albumin: 3.4 g/dL — ABNORMAL LOW (ref 3.5–5.0)
Alkaline Phosphatase: 146 U/L — ABNORMAL HIGH (ref 38–126)
Anion gap: 15 (ref 5–15)
BUN: 11 mg/dL (ref 8–23)
CO2: 26 mmol/L (ref 22–32)
Calcium: 9.7 mg/dL (ref 8.9–10.3)
Chloride: 96 mmol/L — ABNORMAL LOW (ref 98–111)
Creatinine, Ser: 1.32 mg/dL — ABNORMAL HIGH (ref 0.44–1.00)
GFR, Estimated: 41 mL/min — ABNORMAL LOW (ref >60)
Glucose, Bld: 107 mg/dL — ABNORMAL HIGH (ref 70–99)
Potassium: 3.9 mmol/L (ref 3.5–5.1)
Sodium: 137 mmol/L (ref 135–145)
Total Bilirubin: 1.1 mg/dL (ref 0.0–1.2)
Total Protein: 7 g/dL (ref 6.5–8.1)

## 2024-06-07 LAB — TROPONIN I (HIGH SENSITIVITY): Troponin I (High Sensitivity): 7 ng/L (ref <18)

## 2024-06-07 LAB — LIPASE, BLOOD: Lipase: 24 U/L (ref 11–51)

## 2024-06-07 LAB — HEMOGLOBIN AND HEMATOCRIT, BLOOD
HCT: 37.6 % (ref 36.0–46.0)
Hemoglobin: 12.6 g/dL (ref 12.0–15.0)

## 2024-06-07 MED ORDER — SODIUM CHLORIDE 0.9 % IV BOLUS
1000.0000 mL | Freq: Once | INTRAVENOUS | Status: AC
  Administered 2024-06-07: 1000 mL via INTRAVENOUS

## 2024-06-07 MED ORDER — ONDANSETRON HCL 4 MG/2ML IJ SOLN
4.0000 mg | Freq: Four times a day (QID) | INTRAMUSCULAR | Status: DC | PRN
  Administered 2024-06-08 – 2024-06-09 (×3): 4 mg via INTRAVENOUS
  Filled 2024-06-07 (×2): qty 2

## 2024-06-07 MED ORDER — METOPROLOL TARTRATE 25 MG PO TABS: 25.0000 mg | ORAL_TABLET | Freq: Three times a day (TID) | ORAL | Status: DC | PRN

## 2024-06-07 MED ORDER — ENOXAPARIN SODIUM 80 MG/0.8ML IJ SOSY: 70.0000 mg | PREFILLED_SYRINGE | Freq: Two times a day (BID) | INTRAMUSCULAR | Status: DC

## 2024-06-07 MED ORDER — APIXABAN 5 MG PO TABS: 5.0000 mg | ORAL_TABLET | Freq: Two times a day (BID) | ORAL | Status: DC

## 2024-06-07 MED ORDER — ALPRAZOLAM 0.25 MG PO TABS: 0.2500 mg | ORAL_TABLET | Freq: Every evening | ORAL | Status: DC | PRN

## 2024-06-07 MED ORDER — POLYETHYLENE GLYCOL 3350 17 G PO PACK
17.0000 g | PACK | Freq: Every day | ORAL | Status: DC | PRN
Start: 2024-06-07 — End: 2024-06-07

## 2024-06-07 MED ORDER — LEVOTHYROXINE SODIUM 75 MCG PO TABS
75.0000 ug | ORAL_TABLET | Freq: Every day | ORAL | Status: DC
  Administered 2024-06-10: 75 ug via ORAL
  Filled 2024-06-07: qty 1

## 2024-06-07 MED ORDER — FLUCONAZOLE 100 MG PO TABS
200.0000 mg | ORAL_TABLET | Freq: Once | ORAL | Status: AC
  Administered 2024-06-07: 200 mg via ORAL
  Filled 2024-06-07: qty 2

## 2024-06-07 MED ORDER — ROSUVASTATIN CALCIUM 10 MG PO TABS
5.0000 mg | ORAL_TABLET | Freq: Every day | ORAL | Status: DC
  Administered 2024-06-08 – 2024-06-10 (×3): 5 mg via ORAL
  Filled 2024-06-07 (×3): qty 1

## 2024-06-07 MED ORDER — HYDROCODONE-ACETAMINOPHEN 7.5-325 MG PO TABS
1.0000 | ORAL_TABLET | Freq: Four times a day (QID) | ORAL | Status: DC | PRN
  Administered 2024-06-07 – 2024-06-08 (×3): 1 via ORAL
  Filled 2024-06-07 (×3): qty 1

## 2024-06-07 MED ORDER — MORPHINE SULFATE (PF) 2 MG/ML IV SOLN
1.0000 mg | INTRAVENOUS | Status: DC | PRN
  Administered 2024-06-08 (×4): 1 mg via INTRAVENOUS
  Filled 2024-06-07 (×4): qty 1

## 2024-06-07 MED ORDER — TAMSULOSIN HCL 0.4 MG PO CAPS
0.4000 mg | ORAL_CAPSULE | Freq: Every day | ORAL | Status: DC
  Administered 2024-06-08 – 2024-06-10 (×2): 0.4 mg via ORAL
  Filled 2024-06-07 (×2): qty 1

## 2024-06-07 MED ORDER — MELATONIN 3 MG PO TABS
3.0000 mg | ORAL_TABLET | Freq: Every day | ORAL | Status: DC
  Administered 2024-06-07 – 2024-06-09 (×3): 3 mg via ORAL
  Filled 2024-06-07 (×3): qty 1

## 2024-06-07 MED ORDER — ADULT MULTIVITAMIN W/MINERALS CH
1.0000 | ORAL_TABLET | Freq: Every day | ORAL | Status: DC
  Administered 2024-06-07 – 2024-06-10 (×4): 1 via ORAL
  Filled 2024-06-07 (×4): qty 1

## 2024-06-07 MED ORDER — ONDANSETRON HCL 4 MG PO TABS: 4.0000 mg | ORAL_TABLET | Freq: Four times a day (QID) | ORAL | Status: DC | PRN

## 2024-06-07 MED ORDER — PANTOPRAZOLE SODIUM 40 MG PO TBEC
40.0000 mg | DELAYED_RELEASE_TABLET | Freq: Every day | ORAL | Status: DC
  Administered 2024-06-07 – 2024-06-10 (×4): 40 mg via ORAL
  Filled 2024-06-07 (×4): qty 1

## 2024-06-07 MED ORDER — ACETAMINOPHEN 325 MG PO TABS
650.0000 mg | ORAL_TABLET | Freq: Four times a day (QID) | ORAL | Status: DC | PRN
  Administered 2024-06-07: 650 mg via ORAL
  Filled 2024-06-07 (×2): qty 2

## 2024-06-07 MED ORDER — MAGNESIUM OXIDE -MG SUPPLEMENT 400 (240 MG) MG PO TABS
400.0000 mg | ORAL_TABLET | Freq: Every day | ORAL | Status: DC
  Administered 2024-06-07 – 2024-06-10 (×4): 400 mg via ORAL
  Filled 2024-06-07 (×4): qty 1

## 2024-06-07 MED ORDER — CITALOPRAM HYDROBROMIDE 20 MG PO TABS
20.0000 mg | ORAL_TABLET | Freq: Every day | ORAL | Status: DC
  Administered 2024-06-08 – 2024-06-10 (×3): 20 mg via ORAL
  Filled 2024-06-07 (×3): qty 1

## 2024-06-07 MED ORDER — OLOPATADINE HCL 0.1 % OP SOLN
1.0000 [drp] | Freq: Every day | OPHTHALMIC | Status: DC
  Administered 2024-06-08 – 2024-06-10 (×3): 1 [drp] via OPHTHALMIC
  Filled 2024-06-07: qty 5

## 2024-06-07 MED ORDER — RISAQUAD PO CAPS
1.0000 | ORAL_CAPSULE | Freq: Every day | ORAL | Status: DC
  Administered 2024-06-07 – 2024-06-10 (×4): 1 via ORAL
  Filled 2024-06-07 (×4): qty 1

## 2024-06-07 MED ORDER — SODIUM CHLORIDE 0.9 % IV SOLN
1.0000 g | Freq: Every day | INTRAVENOUS | Status: DC
  Administered 2024-06-07 – 2024-06-09 (×3): 1 g via INTRAVENOUS
  Filled 2024-06-07 (×3): qty 10

## 2024-06-07 MED ORDER — VITAMIN D 25 MCG (1000 UNIT) PO TABS
4000.0000 [IU] | ORAL_TABLET | Freq: Every day | ORAL | Status: DC
  Administered 2024-06-07 – 2024-06-10 (×4): 4000 [IU] via ORAL
  Filled 2024-06-07 (×4): qty 4

## 2024-06-07 MED ORDER — POLYETHYLENE GLYCOL 3350 17 G PO PACK: 17.0000 g | PACK | Freq: Every day | ORAL | Status: DC | PRN

## 2024-06-07 MED ORDER — DILTIAZEM HCL ER COATED BEADS 240 MG PO CP24
240.0000 mg | ORAL_CAPSULE | Freq: Every day | ORAL | Status: DC
  Administered 2024-06-08 – 2024-06-09 (×2): 240 mg via ORAL
  Filled 2024-06-07 (×3): qty 1

## 2024-06-07 MED ORDER — HYDROCODONE-ACETAMINOPHEN 5-325 MG PO TABS
1.0000 | ORAL_TABLET | Freq: Once | ORAL | Status: AC
  Administered 2024-06-07: 1 via ORAL
  Filled 2024-06-07: qty 1

## 2024-06-07 MED ORDER — ACETAMINOPHEN 650 MG RE SUPP: 650.0000 mg | Freq: Four times a day (QID) | RECTAL | Status: DC | PRN

## 2024-06-07 MED ORDER — LACTATED RINGERS IV SOLN: INTRAVENOUS | Status: AC

## 2024-06-07 NOTE — ED Provider Notes (Signed)
 [place] EMERGENCY DEPARTMENT AT [facility] Provider Note   CSN: [id number] Arrival date & time: 06/07/24  1056     Patient presents with: Flank Pain   [name] is a 78 y.o. female.   HPI 78 year old female presents with continued flank pain.  She has been having left flank pain and hematuria for about 3 weeks.  Was put on a few days of Cipro, then changed to Macrobid.  Recently has taken about a week's course of Levaquin that she ended yesterday.  The hematuria and flank pain are still present.  Pain waxes and wanes.  She has had vomiting as recently as yesterday.  No fevers.  Does have a little dysuria.  Prior to Admission medications   Medication Sig Start Date End Date Taking? Authorizing Provider  ALPRAZolam  (XANAX ) 0.25 MG tablet Take 0.25 mg by mouth at bedtime as needed for sleep.    [provider]  apixaban  (ELIQUIS ) 5 MG TABS tablet Take 1 tablet (5 mg total) by mouth 2 (two) times daily. 07/30/22   Debera Jayson MATSU, MD  Cholecalciferol  (VITAMIN D3) 50 MCG (2000 UT) capsule Take 4,000 Units by mouth daily.    [provider]  citalopram  (CELEXA ) 20 MG tablet Take 20 mg by mouth daily. 12/07/21   [provider]  diltiazem  (CARDIZEM  CD) 240 MG 24 hr capsule Take 1 capsule (240 mg total) by mouth daily. 02/10/23   Debera Jayson MATSU, MD  HYDROcodone -acetaminophen  (NORCO/VICODIN) 5-325 MG tablet Take 2 tablets by mouth every 4 (four) hours as needed. 06/01/24   Triplett, Tammy, PA-C  HYDROcodone -acetaminophen  (NORCO/VICODIN) 5-325 MG tablet Take one tab po q 4 hrs prn pain 06/01/24   Triplett, Tammy, PA-C  Lactobacillus-Inulin (PROBIOTIC DIGESTIVE SUPPORT) CAPS Take 1 capsule by mouth daily.    [provider]  levofloxacin (LEVAQUIN) 500 MG tablet Take 500 mg by mouth daily. 06/01/24   [provider]  levothyroxine  (SYNTHROID , LEVOTHROID) 75 MCG tablet Take 75 mcg by mouth daily before breakfast.     [provider]  MAGNESIUM  PO Take 1 tablet by mouth daily. Taking 1 chewable by mouth daily    [provider]  metoprolol  tartrate (LOPRESSOR ) 25 MG tablet TAKE 1 TABLET BY MOUTH EVERY 8  HOURS AS NEEDED FOR PALPITATIONS Patient taking differently: Take 25 mg by mouth every 8 (eight) hours as needed (A-fib palpitations). 09/17/23   Miriam Norris, NP  Multiple Vitamins-Minerals (MULTIVITAMIN WITH MINERALS) tablet Take 1 tablet by mouth daily. One A Day for Women    [provider]  olopatadine  (PATADAY ) 0.1 % ophthalmic solution Place 1 drop into both eyes daily.    [provider]  ondansetron  (ZOFRAN ) 4 MG tablet Take 1 tablet (4 mg total) by mouth every 8 (eight) hours as needed for nausea or vomiting. 06/01/24   Triplett, Tammy, PA-C  ondansetron  (ZOFRAN ) 4 MG tablet Take 1 tablet (4 mg total) by mouth every 6 (six) hours. 06/01/24   Triplett, Tammy, PA-C  polyethylene glycol (MIRALAX  / GLYCOLAX ) 17 g packet Take 17 g by mouth daily as needed for moderate constipation.    [provider]  rosuvastatin  (CRESTOR ) 5 MG tablet TAKE 1 TABLET BY MOUTH DAILY 05/16/24   Debera Jayson MATSU, MD  spironolactone  (ALDACTONE ) 25 MG tablet TAKE 1 TABLET BY MOUTH  DAILY 08/06/20   Debera Jayson MATSU, MD  tamsulosin  (FLOMAX ) 0.4 MG CAPS capsule Take 1 capsule (0.4 mg total) by mouth daily. 06/01/24  Triplett, Tammy, PA-C    Allergies: Other, Latex, Lexiscan  [regadenoson ], Penicillins, Sulfa antibiotics, and Tramadol    Review of Systems  Constitutional:  Negative for fever.  Gastrointestinal:  Positive for nausea and vomiting.  Genitourinary:  Positive for dysuria, flank pain and hematuria.    Updated Vital Signs BP (!) 156/65 (BP Location: Right Arm)   Pulse 78   Temp 98.2 F (36.8 C) (Oral)   Resp 18   Ht 5' 2 (1.575 m)   Wt 75.3 kg   SpO2 90%   BMI 30.36 kg/m   Physical Exam Vitals and nursing note reviewed.  Constitutional:      General: She is not in acute distress.     Appearance: She is well-developed. She is not ill-appearing or diaphoretic.  HENT:     Head: Normocephalic and atraumatic.  Cardiovascular:     Rate and Rhythm: Normal rate and regular rhythm.     Heart sounds: Normal heart sounds.  Pulmonary:     Effort: Pulmonary effort is normal.     Breath sounds: Normal breath sounds.  Abdominal:     Palpations: Abdomen is soft.     Tenderness: There is abdominal tenderness in the left upper quadrant. There is left CVA tenderness.  Skin:    General: Skin is warm and dry.  Neurological:     Mental Status: She is alert.     (all labs ordered are listed, but only abnormal results are displayed) Labs Reviewed  COMPREHENSIVE METABOLIC PANEL WITH GFR - Abnormal; Notable for the following components:      Result Value   Chloride 96 (*)    Glucose, Bld 107 (*)    Creatinine, Ser 1.32 (*)    Albumin 3.4 (*)    Alkaline Phosphatase 146 (*)    GFR, Estimated 41 (*)    All other components within normal limits  URINALYSIS, ROUTINE W REFLEX MICROSCOPIC - Abnormal; Notable for the following components:   Color, Urine AMBER (*)    APPearance CLOUDY (*)    Specific Gravity, Urine 1.032 (*)    Hgb urine dipstick MODERATE (*)    Bilirubin Urine SMALL (*)    Ketones, ur 5 (*)    Protein, ur 100 (*)    Non Squamous Epithelial 0-5 (*)    All other components within normal limits  CBC WITH DIFFERENTIAL/PLATELET - Abnormal; Notable for the following components:   WBC 11.4 (*)    Monocytes Absolute 2.3 (*)    All other components within normal limits  URINE CULTURE  LIPASE, BLOOD  TROPONIN I (HIGH SENSITIVITY)    ED ECG REPORT   Date: 06/07/2024  Rate: 71  Rhythm: normal sinus rhythm  QRS Axis: normal  Intervals: normal  ST/T Wave abnormalities: nonspecific ST/T changes  Conduction Disutrbances:none  Narrative Interpretation:   Old EKG Reviewed: unchanged  I have personally reviewed the EKG tracing and agree with the computerized printout  as noted.   Radiology: DG Chest Portable 1 View Result Date: 06/07/2024 CLINICAL DATA:  chest pain EXAM: PORTABLE CHEST 1 VIEW COMPARISON:  Chest x-ray 10/21/2019 FINDINGS: The heart and mediastinal contours are unchanged. Atherosclerotic plaque. No focal consolidation. No pulmonary edema. No pleural effusion. No pneumothorax. No acute osseous abnormality. Bilateral reversed total shoulder arthroplasties. Cervical spine surgical hardware. Likely thyroid  vascular clips. IMPRESSION: 1. No active cardiopulmonary disease. 2.  Aortic Atherosclerosis (ICD10-I70.0). Electronically Signed   By: Morgane  Naveau M.D.   On: 06/07/2024 14:34   CT Renal Stone Study  Result Date: 06/07/2024 CLINICAL DATA:  Recurrent left flank pain, hematuria, EXAM: CT ABDOMEN AND PELVIS WITHOUT CONTRAST TECHNIQUE: Multidetector CT imaging of the abdomen and pelvis was performed following the standard protocol without IV contrast. RADIATION DOSE REDUCTION: This exam was performed according to the departmental dose-optimization program which includes automated exposure control, adjustment of the mA and/or kV according to patient size and/or use of iterative reconstruction technique. COMPARISON:  June 01, 2024 FINDINGS: Lower chest: Unremarkable. Hepatobiliary: No suspicious liver lesion. No gallstones, gallbladder wall thickening, or biliary dilatation. Pancreas: Unremarkable. Spleen: Unremarkable. Adrenals/Urinary Tract: Adrenal glands are unremarkable. Moderate left hydronephrosis with 0.3 cm obstructing ureteral stone now located in the left distal ureter near the UVJ. Fullness of the right kidney lower pole, possibly related to parapelvic cysts, similar to prior. No definite right-sided nephrolithiasis. Likely punctate nonobstructing left renal stones. Stomach/Bowel: No evidence of bowel obstruction or inflammation. Vascular/Lymphatic: Normal caliber aorta. No lymphadenopathy by size criteria. Reproductive: Status post hysterectomy. No  adnexal masses. Other: No free air or free fluid. Musculoskeletal: Unchanged mild L1 vertebral body compression deformity. IMPRESSION: 1. Interval advancement of a 0.3 cm obstructing left ureteral stone, now located in the distal ureter near the UVJ with persistent left hydronephrosis. 2.  Unchanged mild L1 vertebral body compression fracture. Electronically Signed   By: Michaeline Blanch M.D.   On: 06/07/2024 13:25     Procedures   Medications Ordered in the ED  sodium chloride  0.9 % bolus 1,000 mL (0 mLs Intravenous Stopped 06/07/24 1409)  fluconazole  (DIFLUCAN ) tablet 200 mg (200 mg Oral Given 06/07/24 1409)  HYDROcodone -acetaminophen  (NORCO/VICODIN) 5-325 MG per tablet 1 tablet (1 tablet Oral Given 06/07/24 1409)                                    Medical Decision Making Amount and/or Complexity of Data Reviewed Labs: ordered. Radiology: ordered.  Risk Prescription drug management. Decision regarding hospitalization.   Patient is continuing to have severe pain and vomiting from her ureteral stone.  Does seem like it is progressing down the ureter. Has dysuria but an overall unremarkable UTI. Does have some yeast so will give some fluconazole . Discussed with Dr. Carolee, no indication for an emergent procedure, but if pain isn't controlled, can admit to hospitalist service and have Dr. Sherrilee see in AM. Keep NPO after midnight. She has been having worsening symptoms and vomiting with a mild AKI, so I think admission with supportive care makes sense. Patient agrees with this. Discussed with Dr. Vann.  She also notes intermittent chest tightness which she thinks is probably from vomiting.  Had an episode earlier but none now.  EKG is similar to baseline, will send troponins but low suspicion this is ACS.     Final diagnoses:  Ureteral colic    ED Discharge Orders     None          Freddi Hamilton, MD 06/07/24 1513

## 2024-06-07 NOTE — ED Triage Notes (Signed)
 Pt arrived via POV c/o recurrent left flank pain and hematuria. Pt seen here recently for same. Pt reports she finished her prescribed Levaquin w/o relief of symptoms.

## 2024-06-07 NOTE — TOC CM/SW Note (Signed)
 Transition of Care [facility]) - Inpatient Brief Assessment   Patient Details  Name: [name] MRN: [id number] Date of Birth: [birth year]  Transition of Care [facility]) CM/SW Contact:    Noreen KATHEE Pinal, LCSWA Phone Number: 06/07/2024, 3:20 PM   Clinical Narrative:  Transition of Care Department [facility]) has reviewed patient and no TOC needs have been identified at this time. We will continue to monitor patient advancement through interdisciplinary progression rounds. If new patient transition needs arise, please place a TOC consult.   Transition of Care Asessment: Insurance and Status: Insurance coverage has been reviewed Patient has primary care physician: Yes Home environment has been reviewed: Single Family Home Prior level of function:: Independent Prior/Current Home Services: No current home services Social Drivers of Health Review: SDOH reviewed no interventions necessary Readmission risk has been reviewed: Yes Transition of care needs: no transition of care needs at this time

## 2024-06-07 NOTE — H&P (Addendum)
 History and Physical    [name] FMW:[id number] DOB: Jul 29, 1946 DOA: 06/07/2024  DOS: the patient was seen and examined on 06/07/2024  PCP: Rosamond Leta NOVAK, MD   Patient coming from: Home  I have personally briefly reviewed patient's old medical records in [facility] and CareEverywhere  HPI:   [name] is a 78 y.o. year old female with past medical history of hypertension, hyperlipidemia, atrial fibrillation, GERD, and hypothyroidism.  He presents to [name] ED reports of left-sided flank pain hematuria that has been ongoing for about 3 weeks.  She was seen in [facility] ED on 06/01/24 for the same.  She has taken Cipro with no improvement in symptoms, then completed a course of Macrobid, and finally completed a 1 week course of Levaquin with last dose being yesterday.  She continues to have left flank pain and hematuria after completion of the antibiotics.  She endorses nausea, vomiting, and a mild burning sensation with urination.  Denies abdominal pain.  ED Course: On arrival to [facility] ED patient was noted to be afebrile temp 36.8 C, BP 156/65, HR 78, RR 18, SPO2 90% on room air.   CXR obtained and negative for any active cardiopulmonary disease.  CT renal stone study obtained and shows 0.3 cm obstructing left ureteral stone located in the distal ureter near the UVJ and associated left hydronephrosis, prior L1 vertebral body compression fracture seen again.  Labs notable for UA without signs of infection, creatinine 1.32, alkaline phosphatase 146 with normal AST/ALT, and WBC 11.4.  She was given Diflucan , Norco, and 1 L normal saline bolus. TRH contacted for admission.  Review of Systems: As mentioned in the history of present illness. All other systems reviewed and are negative.   Past Medical History:  Diagnosis Date   Anxiety    Arthritis    Atrial fibrillation [facility])    Documented December 2019   Complication of anesthesia    slow to wake up   Depression     GERD (gastroesophageal reflux disease)    Heart murmur    Hyperlipidemia    Hypertension    Hypothyroidism    PONV (postoperative nausea and vomiting)     Past Surgical History:  Procedure Laterality Date   APPENDECTOMY     ATRIAL FIBRILLATION ABLATION N/A 04/05/2024   Procedure: ATRIAL FIBRILLATION ABLATION;  Surgeon: Nancey Eulas BRAVO, MD;  Location: [facility] INVASIVE CV LAB;  Service: Cardiovascular;  Laterality: N/A;   BUNIONECTOMY Bilateral    CATARACT EXTRACTION W/PHACO Right 10/09/2017   Procedure: CATARACT EXTRACTION PHACO AND INTRAOCULAR LENS PLACEMENT (IOC);  Surgeon: Harrie Agent, MD;  Location: [facility] ORS;  Service: Ophthalmology;  Laterality: Right;  CDE: 6.65   CATARACT EXTRACTION W/PHACO Left 12/11/2017   Procedure: CATARACT EXTRACTION PHACO AND INTRAOCULAR LENS PLACEMENT (IOC);  Surgeon: Harrie Agent, MD;  Location: [facility] ORS;  Service: Ophthalmology;  Laterality: Left;  CDE: 5.28   COLONOSCOPY  06/18/2012   Procedure: COLONOSCOPY;  Surgeon: Claudis RAYMOND Rivet, MD;  Location: [facility] ENDO SUITE;  Service: Endoscopy;  Laterality: N/A;  730   COLONOSCOPY N/A 07/04/2019   Rehman: one 7 mm polyp in proximal ascending colon, removed, clips placed to treat postpolypectomy bleed (tubular adenoma), diverticulosis in sigmoid colon, hepatic flexure, external hemorrhoids   ESOPHAGOGASTRODUODENOSCOPY     Hammer toe repair Left    HEMORRHOID SURGERY     KNEE ARTHROSCOPY     left 06-2019   REVERSE SHOULDER ARTHROPLASTY Right 07/12/2021  Procedure: REVERSE SHOULDER ARTHROPLASTY;  Surgeon: Kay Kemps, MD;  Location: [facility] ORS;  Service: Orthopedics;  Laterality: Right;  with ISB   SHOULDER ARTHROSCOPY     right   THYROIDECTOMY     TONSILLECTOMY     TOTAL ABDOMINAL HYSTERECTOMY     TOTAL KNEE ARTHROPLASTY Right 06/29/2020   Procedure: TOTAL KNEE ARTHROPLASTY;  Surgeon: Kay Kemps, MD;  Location: [facility] ORS;  Service: Orthopedics;  Laterality: Right;  adductor canal   TOTAL KNEE ARTHROPLASTY Left  11/13/2023   Procedure: TOTAL KNEE ARTHROPLASTY;  Surgeon: Kay Kemps, MD;  Location: [facility] ORS;  Service: Orthopedics;  Laterality: Left;  general anesthesia 120 flip room   TOTAL SHOULDER ARTHROPLASTY Left 02/06/2017   TOTAL SHOULDER ARTHROPLASTY Left 02/06/2017   Procedure: TOTAL SHOULDER ARTHROPLASTY;  Surgeon: Kemps Kay, MD;  Location: [facility] OR;  Service: Orthopedics;  Laterality: Left;     reports that she has never smoked. She has never been exposed to tobacco smoke. She has never used smokeless tobacco. She reports that she does not drink alcohol  and does not use drugs.  Allergies  Allergen Reactions   Other Nausea And Vomiting    Opiate based pain medicines    Latex Rash   Lexiscan  [Regadenoson ] Nausea Only and Other (See Comments)    See 12/14/2023 Lexiscan  report for details-hypotension and nausea   Penicillins Rash   Sulfa Antibiotics Rash   Tramadol Itching    Family History  Problem Relation Age of Onset   Atrial fibrillation Sister    Colon cancer Neg Hx    Breast cancer Neg Hx    Parkinson's disease Neg Hx    Tremor Neg Hx     Prior to Admission medications   Medication Sig Start Date End Date Taking? Authorizing Provider  ALPRAZolam  (XANAX ) 0.25 MG tablet Take 0.25 mg by mouth at bedtime as needed for sleep.    [provider]  apixaban  (ELIQUIS ) 5 MG TABS tablet Take 1 tablet (5 mg total) by mouth 2 (two) times daily. 07/30/22   Debera Jayson MATSU, MD  Cholecalciferol  (VITAMIN D3) 50 MCG (2000 UT) capsule Take 4,000 Units by mouth daily.    [provider]  citalopram  (CELEXA ) 20 MG tablet Take 20 mg by mouth daily. 12/07/21   [provider]  diltiazem  (CARDIZEM  CD) 240 MG 24 hr capsule Take 1 capsule (240 mg total) by mouth daily. 02/10/23   Debera Jayson MATSU, MD  HYDROcodone -acetaminophen  (NORCO/VICODIN) 5-325 MG tablet Take 2 tablets by mouth every 4 (four) hours as needed. 06/01/24   Triplett, Tammy, PA-C  HYDROcodone -acetaminophen   (NORCO/VICODIN) 5-325 MG tablet Take one tab po q 4 hrs prn pain 06/01/24   Triplett, Tammy, PA-C  Lactobacillus-Inulin (PROBIOTIC DIGESTIVE SUPPORT) CAPS Take 1 capsule by mouth daily.    [provider]  levofloxacin (LEVAQUIN) 500 MG tablet Take 500 mg by mouth daily. 06/01/24   [provider]  levothyroxine  (SYNTHROID , LEVOTHROID) 75 MCG tablet Take 75 mcg by mouth daily before breakfast.     [provider]  MAGNESIUM  PO Take 1 tablet by mouth daily. Taking 1 chewable by mouth daily    [provider]  metoprolol  tartrate (LOPRESSOR ) 25 MG tablet TAKE 1 TABLET BY MOUTH EVERY 8  HOURS AS NEEDED FOR PALPITATIONS Patient taking differently: Take 25 mg by mouth every 8 (eight) hours as needed (A-fib palpitations). 09/17/23   Miriam Norris, NP  Multiple Vitamins-Minerals (MULTIVITAMIN WITH MINERALS) tablet Take 1 tablet by mouth daily. One A Day  for Women    [provider]  olopatadine  (PATADAY ) 0.1 % ophthalmic solution Place 1 drop into both eyes daily.    [provider]  ondansetron  (ZOFRAN ) 4 MG tablet Take 1 tablet (4 mg total) by mouth every 8 (eight) hours as needed for nausea or vomiting. 06/01/24   Triplett, Tammy, PA-C  ondansetron  (ZOFRAN ) 4 MG tablet Take 1 tablet (4 mg total) by mouth every 6 (six) hours. 06/01/24   Triplett, Tammy, PA-C  polyethylene glycol (MIRALAX  / GLYCOLAX ) 17 g packet Take 17 g by mouth daily as needed for moderate constipation.    [provider]  rosuvastatin  (CRESTOR ) 5 MG tablet TAKE 1 TABLET BY MOUTH DAILY 05/16/24   Debera Jayson MATSU, MD  spironolactone  (ALDACTONE ) 25 MG tablet TAKE 1 TABLET BY MOUTH  DAILY 08/06/20   Debera Jayson MATSU, MD  tamsulosin  (FLOMAX ) 0.4 MG CAPS capsule Take 1 capsule (0.4 mg total) by mouth daily. 06/01/24   Herlinda Milling, PA-C    Physical Exam: Vitals:   06/07/24 1530 06/07/24 1545 06/07/24 1600 06/07/24 1613  BP: (!) 117/53 125/66 (!) 116/49   Pulse: 65 65 61    Resp: 12 12 17    Temp:    99.4 F (37.4 C)  TempSrc:    Oral  SpO2: 96% 96% 91%   Weight:      Height:         Constitutional: Elderly Caucasian lady in NAD, calm, comfortable Eyes: PERRL, lids and conjunctivae normal ENMT: Mucous membranes are moist. Posterior pharynx clear of any exudate or lesions. Earwax noted in(R) ear canal Neck: normal, supple, no masses, no thyromegaly Respiratory: clear to auscultation bilaterally, no wheezing, no crackles. Normal respiratory effort. No accessory muscle use.  Cardiovascular: Regular rate and rhythm, no murmurs / rubs / gallops. No extremity edema. 2+ radial and pedal pulses.   Abdomen: Soft. (L)UQ abdominal tenderness and (L) CVA tenderness noted.  Bowel sounds positive x4 quadrants.  Musculoskeletal: no clubbing / cyanosis. No joint deformity upper and lower extremities. Good ROM, no contractures. Normal muscle tone.  Skin: Warm, dry.  No rashes, lesions, ulcers. Neurologic: CN 2-12 grossly intact.  Alert and oriented x 3.    Labs on Admission: I have personally reviewed following labs and imaging studies  CBC: Recent Labs  Lab 06/01/24 1735 06/07/24 1140  WBC 9.8 11.4*  NEUTROABS 6.2 7.7  HGB 16.2* 14.1  HCT 49.9* 41.8  MCV 94.7 94.4  PLT 307 268   Basic Metabolic Panel: Recent Labs  Lab 06/01/24 1735 06/07/24 1140  NA 135 137  K 3.8 3.9  CL 99 96*  CO2 23 26  GLUCOSE 111* 107*  BUN 16 11  CREATININE 0.88 1.32*  CALCIUM  9.9 9.7   GFR: Estimated Creatinine Clearance: 33.4 mL/min (A) (by C-G formula based on SCr of 1.32 mg/dL (H)). Liver Function Tests: Recent Labs  Lab 06/01/24 1735 06/07/24 1140  AST 30 31  ALT 22 23  ALKPHOS 96 146*  BILITOT 0.9 1.1  PROT 8.0 7.0  ALBUMIN 4.3 3.4*   Recent Labs  Lab 06/07/24 1140  LIPASE 24   No results for input(s): AMMONIA in the last 168 hours. Coagulation Profile: No results for input(s): INR, PROTIME in the last 168 hours. Cardiac Enzymes: Recent Labs   Lab 06/07/24 1140  TROPONINIHS 7   BNP (last 3 results) No results for input(s): BNP in the last 8760 hours. HbA1C: No results for input(s): HGBA1C in the last 72 hours. CBG:  No results for input(s): GLUCAP in the last 168 hours. Lipid Profile: No results for input(s): CHOL, HDL, LDLCALC, TRIG, CHOLHDL, LDLDIRECT in the last 72 hours. Thyroid  Function Tests: No results for input(s): TSH, T4TOTAL, FREET4, T3FREE, THYROIDAB in the last 72 hours. Anemia Panel: No results for input(s): VITAMINB12, FOLATE, FERRITIN, TIBC, IRON, RETICCTPCT in the last 72 hours. Urine analysis:    Component Value Date/Time   COLORURINE AMBER (A) 06/07/2024 1115   APPEARANCEUR CLOUDY (A) 06/07/2024 1115   LABSPEC 1.032 (H) 06/07/2024 1115   PHURINE 5.0 06/07/2024 1115   GLUCOSEU NEGATIVE 06/07/2024 1115   HGBUR MODERATE (A) 06/07/2024 1115   BILIRUBINUR SMALL (A) 06/07/2024 1115   KETONESUR 5 (A) 06/07/2024 1115   PROTEINUR 100 (A) 06/07/2024 1115   NITRITE NEGATIVE 06/07/2024 1115   LEUKOCYTESUR NEGATIVE 06/07/2024 1115    Radiological Exams on Admission: I have personally reviewed images DG Chest Portable 1 View Result Date: 06/07/2024 CLINICAL DATA:  chest pain EXAM: PORTABLE CHEST 1 VIEW COMPARISON:  Chest x-ray 10/21/2019 FINDINGS: The heart and mediastinal contours are unchanged. Atherosclerotic plaque. No focal consolidation. No pulmonary edema. No pleural effusion. No pneumothorax. No acute osseous abnormality. Bilateral reversed total shoulder arthroplasties. Cervical spine surgical hardware. Likely thyroid  vascular clips. IMPRESSION: 1. No active cardiopulmonary disease. 2.  Aortic Atherosclerosis (ICD10-I70.0). Electronically Signed   By: Morgane  Naveau M.D.   On: 06/07/2024 14:34   CT Renal Stone Study Result Date: 06/07/2024 CLINICAL DATA:  Recurrent left flank pain, hematuria, EXAM: CT ABDOMEN AND PELVIS WITHOUT CONTRAST TECHNIQUE: Multidetector CT  imaging of the abdomen and pelvis was performed following the standard protocol without IV contrast. RADIATION DOSE REDUCTION: This exam was performed according to the departmental dose-optimization program which includes automated exposure control, adjustment of the mA and/or kV according to patient size and/or use of iterative reconstruction technique. COMPARISON:  June 01, 2024 FINDINGS: Lower chest: Unremarkable. Hepatobiliary: No suspicious liver lesion. No gallstones, gallbladder wall thickening, or biliary dilatation. Pancreas: Unremarkable. Spleen: Unremarkable. Adrenals/Urinary Tract: Adrenal glands are unremarkable. Moderate left hydronephrosis with 0.3 cm obstructing ureteral stone now located in the left distal ureter near the UVJ. Fullness of the right kidney lower pole, possibly related to parapelvic cysts, similar to prior. No definite right-sided nephrolithiasis. Likely punctate nonobstructing left renal stones. Stomach/Bowel: No evidence of bowel obstruction or inflammation. Vascular/Lymphatic: Normal caliber aorta. No lymphadenopathy by size criteria. Reproductive: Status post hysterectomy. No adnexal masses. Other: No free air or free fluid. Musculoskeletal: Unchanged mild L1 vertebral body compression deformity. IMPRESSION: 1. Interval advancement of a 0.3 cm obstructing left ureteral stone, now located in the distal ureter near the UVJ with persistent left hydronephrosis. 2.  Unchanged mild L1 vertebral body compression fracture. Electronically Signed   By: Michaeline Blanch M.D.   On: 06/07/2024 13:25     Assessment/Plan Principal Problem:   Nephrolithiasis   Active medical issues : ##Nephrolithiasis CT renal stone with 0.3 cm obstructing left ureteral stone in the distal ureter near the UVJ with left hydronephrosis. No evidence of infection at this time, with reassuring urinalysis.  Patient has completed recent outpatient courses of Cipro, Macrobid, and Levaquin. - IV fluid hydration -  Flomax  - PRN analgesia - PRN antiemetics - Urology consulted, appreciate their recommendations and management  ##Hematuria HGB 14.1, down from 16.2 days ago - Recheck H&H this evening - Hold home Eliquis  - Treat #1 as above  ##Acute kidney injury - IVF hydration --> LR at 100 mL/h - Avoid nephrotoxins, contrast Dyes,  Hypotension and Dehydration  - Continue to Monitor and Trend Renal Function carefully and repeat panel in the AM  Chronic medical issues: #Hypertension BP currently soft, 113/55 - Hold home losartan  #Hyperlipidemia  - Continue home Crestor   #Atrial fibrillation  - Continue home diltiazem  - Hold home Eliquis  pending urology consultation and possible stent placement  #Hypothyroidism -  Continue home Synthroid   #GERD -  Protonix    VTE prophylaxis: SCDs GI prophylaxis: Protonix   Diet: Regular Access: PIV Lines: None Code Status:  Full Code Telemetry: No  Admission status: Observation, Med-Surg Patient is from: Home Anticipated d/c is to: Home Anticipated d/c date is: 1-2 days Patient currently: Pending urology consultation, pain control, and symptomatic improvement  Family Communication: Family member at bedside  Consults called: Urology consulted by EDP   Severity of Illness: The appropriate patient status for this patient is OBSERVATION. Observation status is judged to be reasonable and necessary in order to provide the required intensity of service to ensure the patient's safety. The patient's presenting symptoms, physical exam findings, and initial radiographic and laboratory data in the context of their medical condition is felt to place them at decreased risk for further clinical deterioration. Furthermore, it is anticipated that the patient will be medically stable for discharge from the hospital within 2 midnights of admission.   To reach the provider On-Call:   7AM- 7PM see care teams to locate the attending and reach out to them via  [email or web address] Password: [email or web address] 7PM-7AM contact night-coverage If you still have difficulty reaching the appropriate provider, please page the [facility] (Director on Call) for [facility] on amion for assistance  This document was prepared using Conservation officer, historic buildings and may include unintentional dictation errors.  Rockie Rams FNP-BC, PMHNP-BC Nurse Practitioner [facility]

## 2024-06-07 NOTE — Progress Notes (Signed)
 PHARMACY - ANTICOAGULATION CONSULT NOTE  Pharmacy Consult for lovenox - holding Eliquis  Indication: atrial fibrillation  Allergies  Allergen Reactions   Other Nausea And Vomiting    Opiate based pain medicines    Latex Rash   Lexiscan  [Regadenoson ] Nausea Only and Other (See Comments)    See 12/14/2023 Lexiscan  report for details-hypotension and nausea   Penicillins Rash   Sulfa Antibiotics Rash   Tramadol Itching    Patient Measurements: Height: 5' 2 (157.5 cm) Weight: 75.3 kg (166 lb) IBW/kg (Calculated) : 50.1 HEPARIN  DW (KG): 66.4  Vital Signs: Temp: 99.1 F (37.3 C) (07/15 1522) Temp Source: Oral (07/15 1613) BP: 116/49 (07/15 1600) Pulse Rate: 61 (07/15 1600)  Labs: Recent Labs    06/07/24 1140  HGB 14.1  HCT 41.8  PLT 268  CREATININE 1.32*  TROPONINIHS 7    Estimated Creatinine Clearance: 33.4 mL/min (A) (by C-G formula based on SCr of 1.32 mg/dL (H)).   Medical History: Past Medical History:  Diagnosis Date   Anxiety    Arthritis    Atrial fibrillation [facility])    Documented December 2019   Complication of anesthesia    slow to wake up   Depression    GERD (gastroesophageal reflux disease)    Heart murmur    Hyperlipidemia    Hypertension    Hypothyroidism    PONV (postoperative nausea and vomiting)     Medications:  Medications Prior to Admission  Medication Sig Dispense Refill Last Dose/Taking   ALPRAZolam  (XANAX ) 0.25 MG tablet Take 0.25 mg by mouth at bedtime as needed for sleep.   Past Month   apixaban  (ELIQUIS ) 5 MG TABS tablet Take 1 tablet (5 mg total) by mouth 2 (two) times daily. 28 tablet 0 06/07/2024 at  8:30 AM   Cholecalciferol  (VITAMIN D3) 50 MCG (2000 UT) capsule Take 4,000 Units by mouth daily.   06/06/2024 Morning   citalopram  (CELEXA ) 20 MG tablet Take 20 mg by mouth daily.   06/07/2024 Morning   diltiazem  (CARDIZEM  CD) 240 MG 24 hr capsule Take 1 capsule (240 mg total) by mouth daily. 90 capsule 1 06/07/2024 Morning    Lactobacillus-Inulin (PROBIOTIC DIGESTIVE SUPPORT) CAPS Take 1 capsule by mouth daily.   06/06/2024 Morning   levothyroxine  (SYNTHROID , LEVOTHROID) 75 MCG tablet Take 75 mcg by mouth daily before breakfast.    06/07/2024 Morning   MAGNESIUM  PO Take 1 tablet by mouth daily. Taking 1 chewable by mouth daily   06/06/2024 Morning   metoprolol  tartrate (LOPRESSOR ) 25 MG tablet TAKE 1 TABLET BY MOUTH EVERY 8  HOURS AS NEEDED FOR PALPITATIONS (Patient taking differently: Take 25 mg by mouth every 8 (eight) hours as needed (A-fib palpitations).) 100 tablet 2 Unknown   Multiple Vitamins-Minerals (MULTIVITAMIN WITH MINERALS) tablet Take 1 tablet by mouth daily. One A Day for Women   06/06/2024 Morning   olopatadine  (PATADAY ) 0.1 % ophthalmic solution Place 1 drop into both eyes daily.   06/07/2024 Morning   ondansetron  (ZOFRAN ) 4 MG tablet Take 1 tablet (4 mg total) by mouth every 6 (six) hours. 10 tablet 0 06/07/2024 Morning   polyethylene glycol (MIRALAX  / GLYCOLAX ) 17 g packet Take 17 g by mouth daily as needed for moderate constipation.   06/07/2024 Morning   rosuvastatin  (CRESTOR ) 5 MG tablet TAKE 1 TABLET BY MOUTH DAILY 90 tablet 3 06/07/2024 Morning   spironolactone  (ALDACTONE ) 25 MG tablet TAKE 1 TABLET BY MOUTH  DAILY 90 tablet 1 06/07/2024 Morning   tamsulosin  (FLOMAX ) 0.4 MG  CAPS capsule Take 1 capsule (0.4 mg total) by mouth daily. 15 capsule 0 06/07/2024 Morning    Assessment: Pharmacy consulted to dose Lovenox  in patient with atrial fibrillation.  Patient is on Eliquis  prior to admission with alst dose 7/15 @ 0830- holding for urology consult/possible stent placement.    CrCl 33.4- borderline dose adjustment- monitor  Goal of Therapy:   Monitor platelets by anticoagulation protocol: Yes   Plan:  Lovenox  70 mg subcutaneous every 12 hours. Monitor H&H and s/s of bleeding.  Elspeth Sour, PharmD Clinical Pharmacist 06/07/2024 5:10 PM

## 2024-06-08 DIAGNOSIS — Z96612 Presence of left artificial shoulder joint: Secondary | ICD-10-CM | POA: Diagnosis present

## 2024-06-08 DIAGNOSIS — E890 Postprocedural hypothyroidism: Secondary | ICD-10-CM | POA: Diagnosis present

## 2024-06-08 DIAGNOSIS — Z8249 Family history of ischemic heart disease and other diseases of the circulatory system: Secondary | ICD-10-CM | POA: Diagnosis not present

## 2024-06-08 DIAGNOSIS — E7800 Pure hypercholesterolemia, unspecified: Secondary | ICD-10-CM | POA: Diagnosis present

## 2024-06-08 DIAGNOSIS — N201 Calculus of ureter: Secondary | ICD-10-CM | POA: Diagnosis not present

## 2024-06-08 DIAGNOSIS — Z90710 Acquired absence of both cervix and uterus: Secondary | ICD-10-CM | POA: Diagnosis not present

## 2024-06-08 DIAGNOSIS — F418 Other specified anxiety disorders: Secondary | ICD-10-CM | POA: Diagnosis not present

## 2024-06-08 DIAGNOSIS — N200 Calculus of kidney: Secondary | ICD-10-CM | POA: Diagnosis not present

## 2024-06-08 DIAGNOSIS — I4891 Unspecified atrial fibrillation: Secondary | ICD-10-CM | POA: Diagnosis not present

## 2024-06-08 DIAGNOSIS — N179 Acute kidney failure, unspecified: Secondary | ICD-10-CM | POA: Diagnosis present

## 2024-06-08 DIAGNOSIS — Z87442 Personal history of urinary calculi: Secondary | ICD-10-CM | POA: Diagnosis not present

## 2024-06-08 DIAGNOSIS — Z7901 Long term (current) use of anticoagulants: Secondary | ICD-10-CM | POA: Diagnosis not present

## 2024-06-08 DIAGNOSIS — Z96611 Presence of right artificial shoulder joint: Secondary | ICD-10-CM | POA: Diagnosis present

## 2024-06-08 DIAGNOSIS — Z885 Allergy status to narcotic agent status: Secondary | ICD-10-CM | POA: Diagnosis not present

## 2024-06-08 DIAGNOSIS — Z880 Allergy status to penicillin: Secondary | ICD-10-CM | POA: Diagnosis not present

## 2024-06-08 DIAGNOSIS — Z96653 Presence of artificial knee joint, bilateral: Secondary | ICD-10-CM | POA: Diagnosis present

## 2024-06-08 DIAGNOSIS — K219 Gastro-esophageal reflux disease without esophagitis: Secondary | ICD-10-CM | POA: Diagnosis present

## 2024-06-08 DIAGNOSIS — Z91041 Radiographic dye allergy status: Secondary | ICD-10-CM | POA: Diagnosis not present

## 2024-06-08 DIAGNOSIS — Z860100 Personal history of colon polyps, unspecified: Secondary | ICD-10-CM | POA: Diagnosis not present

## 2024-06-08 DIAGNOSIS — Z91040 Latex allergy status: Secondary | ICD-10-CM | POA: Diagnosis not present

## 2024-06-08 DIAGNOSIS — I10 Essential (primary) hypertension: Secondary | ICD-10-CM | POA: Diagnosis present

## 2024-06-08 DIAGNOSIS — N132 Hydronephrosis with renal and ureteral calculous obstruction: Secondary | ICD-10-CM | POA: Diagnosis not present

## 2024-06-08 DIAGNOSIS — R109 Unspecified abdominal pain: Secondary | ICD-10-CM | POA: Diagnosis present

## 2024-06-08 DIAGNOSIS — E039 Hypothyroidism, unspecified: Secondary | ICD-10-CM | POA: Diagnosis not present

## 2024-06-08 DIAGNOSIS — M1990 Unspecified osteoarthritis, unspecified site: Secondary | ICD-10-CM | POA: Diagnosis present

## 2024-06-08 DIAGNOSIS — Z79890 Hormone replacement therapy (postmenopausal): Secondary | ICD-10-CM | POA: Diagnosis not present

## 2024-06-08 DIAGNOSIS — Z79899 Other long term (current) drug therapy: Secondary | ICD-10-CM | POA: Diagnosis not present

## 2024-06-08 DIAGNOSIS — Z882 Allergy status to sulfonamides status: Secondary | ICD-10-CM | POA: Diagnosis not present

## 2024-06-08 LAB — CBC
HCT: 40.7 % (ref 36.0–46.0)
Hemoglobin: 13.6 g/dL (ref 12.0–15.0)
MCH: 31.6 pg (ref 26.0–34.0)
MCHC: 33.4 g/dL (ref 30.0–36.0)
MCV: 94.4 fL (ref 80.0–100.0)
Platelets: 264 K/uL (ref 150–400)
RBC: 4.31 MIL/uL (ref 3.87–5.11)
RDW: 12.7 % (ref 11.5–15.5)
WBC: 13.6 K/uL — ABNORMAL HIGH (ref 4.0–10.5)
nRBC: 0 % (ref 0.0–0.2)

## 2024-06-08 LAB — URINE CULTURE: Culture: NO GROWTH

## 2024-06-08 LAB — BASIC METABOLIC PANEL WITH GFR
Anion gap: 8 (ref 5–15)
BUN: 10 mg/dL (ref 8–23)
CO2: 26 mmol/L (ref 22–32)
Calcium: 9.2 mg/dL (ref 8.9–10.3)
Chloride: 101 mmol/L (ref 98–111)
Creatinine, Ser: 1.14 mg/dL — ABNORMAL HIGH (ref 0.44–1.00)
GFR, Estimated: 49 mL/min — ABNORMAL LOW (ref >60)
Glucose, Bld: 122 mg/dL — ABNORMAL HIGH (ref 70–99)
Potassium: 4.1 mmol/L (ref 3.5–5.1)
Sodium: 135 mmol/L (ref 135–145)

## 2024-06-08 LAB — MAGNESIUM: Magnesium: 2 mg/dL (ref 1.7–2.4)

## 2024-06-08 NOTE — Hospital Course (Addendum)
 [name] is a 78 y.o. year old female with past medical history of hypertension, hyperlipidemia, atrial fibrillation, GERD, and hypothyroidism.  He presents to [name], ED reports of left-sided flank pain hematuria that has been ongoing for about 3 weeks.  She was seen in [name] ED on 06/01/24 for the same.  She has taken Cipro with no improvement in symptoms, then completed a course of Macrobid, and finally completed a 1 week course of Levaquin with last dose being yesterday.  She continues to have left flank pain and hematuria after completion of the antibiotics.  She endorses nausea, vomiting, and a mild burning sensation with urination.  Denies abdominal pain.   ED Course: afebrile temp 36.8 C, BP 156/65, HR 78, RR 18, SPO2 90% on room air.   CXR obtained and negative for any active cardiopulmonary disease.   CT renal stone study obtained and shows 0.3 cm obstructing left ureteral stone located in the distal ureter near the UVJ and associated left hydronephrosis, prior L1 vertebral body compression fracture seen again.  Labs notable for UA without signs of infection, creatinine 1.32, alkaline phosphatase 146 with normal AST/ALT, and WBC 11.4.  She was given Diflucan , Norco, and 1 L normal saline bolus. [name] contacted for admission.    Assessment & Plan:   Principal Problem:   Nephrolithiasis Active Problems:   Hypertension   High cholesterol   GERD (gastroesophageal reflux disease)   Hypothyroidism     Nephrolithiasis -Hemodynamically stable -Still having left flank pain improved with IV analgesics -Prolonged pain and discomfort  - Urologist [name] following-anticipate intervention 06/09/2024 -N.p.o., on IVF  -Continuing Flomax , as needed analgesics, antiemetics  CT renal stone with 0.3 cm obstructing left ureteral stone in the distal ureter near the UVJ with left hydronephrosis.   -Patient has completed recent outpatient courses of Cipro, Macrobid, and  Levaquin. - UA clean still complaining of dysuria continuing IV ABX   Hematuria HGB 14.1 >> 12.6, 13.6 - Recheck H&H this evening-remained stable - Hold home Eliquis  - Treatment as above   Acute kidney injury - Monitoring-improving Lab Results  Component Value Date   CREATININE 1.14 (H) 06/08/2024   CREATININE 1.32 (H) 06/07/2024   CREATININE 0.88 06/01/2024    - Continue maintenance IV fluid - Avoid nephrotoxins, contrast Dyes, Hypotension and Dehydration  - Continue to Monitor and Trend Renal Function carefully and repeat panel in the AM  Dysuria -UA negative for nitrites, or leukocyte esterase, 5 ketones, hemoglobin, urine WBC >  50 - Complaining of pain and burning with urination, - Will follow the urine culture - Continue IV antibiotics of Rocephin    Chronic medical issues:  Hypertension BP currently soft, 113/55 - Holding home losartan   Hyperlipidemia  - Continue home Crestor    Atrial fibrillation - Continue home diltiazem  - Hold home Eliquis  pending urology consultation and possible stent placement   Hypothyroidism -  Continue home Synthroid    GERD-  Protonix 

## 2024-06-08 NOTE — Progress Notes (Signed)
 Mobility Specialist Progress Note:    06/08/24 0804  Mobility  Activity Ambulated with assistance to bathroom;Transferred from bed to chair  Level of Assistance Contact guard assist, steadying assist  Assistive Device Cane  Distance Ambulated (ft) 30 ft  Range of Motion/Exercises Active;All extremities  Activity Response Tolerated well  Mobility Referral Yes  Mobility visit 1 Mobility  Mobility Specialist Start Time (ACUTE ONLY) 0747  Mobility Specialist Stop Time (ACUTE ONLY) 0804  Mobility Specialist Time Calculation (min) (ACUTE ONLY) 17 min   Pt received in bed, agreeable to mobility. Required CGA to stand and ambulate with cane. Tolerated well, asx throughout. Call bell in reach, alarm on. All needs met.   Sherrilee Ditty Mobility Specialist Please contact via Special educational needs teacher or  Rehab office at [phone]

## 2024-06-08 NOTE — Progress Notes (Signed)
 PROGRESS NOTE    Patient: [name]                            PCP: Rosamond Leta NOVAK, MD                    DOB: 11-22-1946            DOA: 06/07/2024 FMW:[id number]             DOS: 06/08/2024, 11:00 AM   LOS: 0 days   Date of Service: The patient was seen and examined on 06/08/2024  Subjective:   The patient was seen and examined this morning. Hemodynamically stable. Still complaining of flank pain improved with analgesics Complaint of burn and pain with urination  Brief Narrative:   [name] is a 78 y.o. year old female with past medical history of hypertension, hyperlipidemia, atrial fibrillation, GERD, and hypothyroidism.  He presents to [name] ED reports of left-sided flank pain hematuria that has been ongoing for about 3 weeks.  She was seen in [facility] ED on 06/01/24 for the same.  She has taken Cipro with no improvement in symptoms, then completed a course of Macrobid, and finally completed a 1 week course of Levaquin with last dose being yesterday.  She continues to have left flank pain and hematuria after completion of the antibiotics.  She endorses nausea, vomiting, and a mild burning sensation with urination.  Denies abdominal pain.   ED Course: afebrile temp 36.8 C, BP 156/65, HR 78, RR 18, SPO2 90% on room air.   CXR obtained and negative for any active cardiopulmonary disease.   CT renal stone study obtained and shows 0.3 cm obstructing left ureteral stone located in the distal ureter near the UVJ and associated left hydronephrosis, prior L1 vertebral body compression fracture seen again.  Labs notable for UA without signs of infection, creatinine 1.32, alkaline phosphatase 146 with normal AST/ALT, and WBC 11.4.  She was given Diflucan , Norco, and 1 L normal saline bolus. [facility] contacted for admission.    Assessment & Plan:   Principal Problem:   Nephrolithiasis Active Problems:   Hypertension   High cholesterol   GERD (gastroesophageal reflux  disease)   Hypothyroidism     Nephrolithiasis - Still complaining of left flank pain, improved with pain medication -Prolonged pain and discomfort  - Discussed with urologist Dr. Sherrilee -anticipate intervention tomorrow 06/09/2024 -Resuming diet today, n.p.o. after midnight -Continuing Flomax , as needed analgesics, antiemetics - Continue gentle IVF  CT renal stone with 0.3 cm obstructing left ureteral stone in the distal ureter near the UVJ with left hydronephrosis. No evidence of infection at this time, with reassuring urinalysis.  Patient has completed recent outpatient courses of Cipro, Macrobid, and Levaquin.   - Urology consulted, appreciate close follow-up and recommendations   Hematuria HGB 14.1, down from 16.2 days ago - Recheck H&H this evening-remained stable - Hold home Eliquis  - Treatment as above   Acute kidney injury - Monitoring-improving Lab Results  Component Value Date   CREATININE 1.14 (H) 06/08/2024   CREATININE 1.32 (H) 06/07/2024   CREATININE 0.88 06/01/2024    - IVF hydration -->Cont.  LR at 100 mL/h - Avoid nephrotoxins, contrast Dyes, Hypotension and Dehydration  - Continue to Monitor and Trend Renal Function carefully and repeat panel in the AM  Dysuria -UA negative for nitrites, or leukocyte esterase, 5 ketones, hemoglobin, urine WBC >  50 - Complaining of pain and burning with urination, - Will follow the urine culture - Continue IV antibiotics of Rocephin   Chronic medical issues:  Hypertension BP currently soft, 113/55 - Holding home losartan   Hyperlipidemia  - Continue home Crestor    Atrial fibrillation - Continue home diltiazem  - Hold home Eliquis  pending urology consultation and possible stent placement   Hypothyroidism -  Continue home Synthroid    GERD-  Protonix       Nutritional status:  The patient's BMI is: Body mass index is 30.86 kg/m. I agree with the assessment and plan as outlined   Cultures; Blood Cultures  x 2 >> Urine Culture  >>>     ------------------------------------------------------------------------------------------------------------------------------------------------  DVT prophylaxis:  Place and maintain sequential compression device Start: 06/07/24 1715   Code Status:   Code Status: Full Code  Family Communication: No family member present at bedside-  -Advance care planning has been discussed.   Admission status:   Status is: Inpatient Patient meets inpatient criteria needing urological management possible stenting in lithotripsy IV antibiotics  Disposition: From  - home             Planning for discharge in 1-2 days   Procedures:   No admission procedures for hospital encounter.   Antimicrobials:  Anti-infectives (From admission, onward)    Start     Dose/Rate Route Frequency Ordered Stop   06/07/24 1900  cefTRIAXone  (ROCEPHIN ) 1 g in sodium chloride  0.9 % 100 mL IVPB        1 g 200 mL/hr over 30 Minutes Intravenous Daily-1800 06/07/24 1806     06/07/24 1400  fluconazole  (DIFLUCAN ) tablet 200 mg        200 mg Oral  Once 06/07/24 1355 06/07/24 1409        Medication:   acidophilus  1 capsule Oral Daily   cholecalciferol   4,000 Units Oral Daily   citalopram   20 mg Oral Daily   diltiazem   240 mg Oral Daily   levothyroxine   75 mcg Oral QAC breakfast   magnesium  oxide  400 mg Oral Daily   melatonin  3 mg Oral QHS   multivitamin with minerals  1 tablet Oral Daily   olopatadine   1 drop Both Eyes Daily   pantoprazole   40 mg Oral Daily   rosuvastatin   5 mg Oral Daily   tamsulosin   0.4 mg Oral Daily    acetaminophen  **OR** acetaminophen , ALPRAZolam , HYDROcodone -acetaminophen , metoprolol  tartrate, morphine  injection, ondansetron  **OR** ondansetron  (ZOFRAN ) IV, polyethylene glycol   Objective:   Vitals:   06/07/24 1953 06/08/24 0004 06/08/24 0429 06/08/24 0600  BP: (!) 133/53 (!) 142/60 (!) 153/67   Pulse: 65 72 71   Resp:      Temp: 98.7 F (37.1  C) 98.7 F (37.1 C) 99 F (37.2 C)   TempSrc: Oral Oral Oral   SpO2: 97% 94% 94% 94%  Weight:      Height:        Intake/Output Summary (Last 24 hours) at 06/08/2024 1100 Last data filed at 06/08/2024 0547 Gross per 24 hour  Intake 2120 ml  Output 450 ml  Net 1670 ml   Filed Weights   06/07/24 1104 06/07/24 1635  Weight: 75.3 kg 76.5 kg     Physical examination:   General:  AAO x 3,  cooperative, no distress;   HEENT:  Normocephalic, PERRL, otherwise with in Normal limits   Neuro:  CNII-XII intact. , normal motor and sensation, reflexes intact   Lungs:  Clear to auscultation BL, Respirations unlabored,  No wheezes / crackles  Cardio:    S1/S2, RRR, No murmure, No Rubs or Gallops   Abdomen:  Soft, non-tender, bowel sounds active all four quadrants, no guarding or peritoneal signs.  Muscular  skeletal:  Positive CVA tenderness Limited exam -global generalized weaknesses - in bed, able to move all 4 extremities,   2+ pulses,  symmetric, No pitting edema  Skin:  Dry, warm to touch, negative for any Rashes,  Wounds: Please see nursing documentation       ------------------------------------------------------------------------------------------------------------------------------------------    LABs:     Latest Ref Rng & Units 06/08/2024    4:25 AM 06/07/2024    9:30 PM 06/07/2024   11:40 AM  CBC  WBC 4.0 - 10.5 K/uL 13.6   11.4   Hemoglobin 12.0 - 15.0 g/dL 86.3  87.3  85.8   Hematocrit 36.0 - 46.0 % 40.7  37.6  41.8   Platelets 150 - 400 K/uL 264   268       Latest Ref Rng & Units 06/08/2024    4:25 AM 06/07/2024   11:40 AM 06/01/2024    5:35 PM  CMP  Glucose 70 - 99 mg/dL 877  892  888   BUN 8 - 23 mg/dL 10  11  16    Creatinine 0.44 - 1.00 mg/dL 8.85  8.67  9.11   Sodium 135 - 145 mmol/L 135  137  135   Potassium 3.5 - 5.1 mmol/L 4.1  3.9  3.8   Chloride 98 - 111 mmol/L 101  96  99   CO2 22 - 32 mmol/L 26  26  23    Calcium  8.9 - 10.3 mg/dL 9.2  9.7  9.9    Total Protein 6.5 - 8.1 g/dL  7.0  8.0   Total Bilirubin 0.0 - 1.2 mg/dL  1.1  0.9   Alkaline Phos 38 - 126 U/L  146  96   AST 15 - 41 U/L  31  30   ALT 0 - 44 U/L  23  22        Micro Results Recent Results (from the past 240 hours)  Urine Culture     Status: Abnormal   Collection Time: 06/01/24  5:28 PM   Specimen: Urine, Clean Catch  Result Value Ref Range Status   Specimen Description   Final    URINE, CLEAN CATCH Performed at [facility], [address] [place], [place] [address]    Special Requests   Final    NONE Performed at [facility], [address], [place], [place] [address]    Culture MULTIPLE SPECIES PRESENT, SUGGEST RECOLLECTION (A)  Final   Report Status 06/02/2024 FINAL  Final    Radiology Reports DG Chest Portable 1 View Result Date: 06/07/2024 CLINICAL DATA:  chest pain EXAM: PORTABLE CHEST 1 VIEW COMPARISON:  Chest x-ray 10/21/2019 FINDINGS: The heart and mediastinal contours are unchanged. Atherosclerotic plaque. No focal consolidation. No pulmonary edema. No pleural effusion. No pneumothorax. No acute osseous abnormality. Bilateral reversed total shoulder arthroplasties. Cervical spine surgical hardware. Likely thyroid  vascular clips. IMPRESSION: 1. No active cardiopulmonary disease. 2.  Aortic Atherosclerosis (ICD10-I70.0). Electronically Signed   By: Morgane  Naveau M.D.   On: 06/07/2024 14:34   CT Renal Stone Study Result Date: 06/07/2024 CLINICAL DATA:  Recurrent left flank pain, hematuria, EXAM: CT ABDOMEN AND PELVIS WITHOUT CONTRAST TECHNIQUE: Multidetector CT imaging of the abdomen and pelvis was performed following the standard protocol without  IV contrast. RADIATION DOSE REDUCTION: This exam was performed according to the departmental dose-optimization program which includes automated exposure control, adjustment of the mA and/or kV according to patient size and/or use of iterative reconstruction technique. COMPARISON:  June 01, 2024 FINDINGS: Lower  chest: Unremarkable. Hepatobiliary: No suspicious liver lesion. No gallstones, gallbladder wall thickening, or biliary dilatation. Pancreas: Unremarkable. Spleen: Unremarkable. Adrenals/Urinary Tract: Adrenal glands are unremarkable. Moderate left hydronephrosis with 0.3 cm obstructing ureteral stone now located in the left distal ureter near the UVJ. Fullness of the right kidney lower pole, possibly related to parapelvic cysts, similar to prior. No definite right-sided nephrolithiasis. Likely punctate nonobstructing left renal stones. Stomach/Bowel: No evidence of bowel obstruction or inflammation. Vascular/Lymphatic: Normal caliber aorta. No lymphadenopathy by size criteria. Reproductive: Status post hysterectomy. No adnexal masses. Other: No free air or free fluid. Musculoskeletal: Unchanged mild L1 vertebral body compression deformity. IMPRESSION: 1. Interval advancement of a 0.3 cm obstructing left ureteral stone, now located in the distal ureter near the UVJ with persistent left hydronephrosis. 2.  Unchanged mild L1 vertebral body compression fracture. Electronically Signed   By: Michaeline Blanch M.D.   On: 06/07/2024 13:25    SIGNED: Adriana DELENA Grams, MD, FHM. FAAFP. Jolynn Pack - [facility] Time spent - 55 min.  In seeing, evaluating and examining the patient. Reviewing medical records, labs, drawn plan of care. [facility],  Pager (please use [email or web address] to page/ text) Please use Epic Secure Chat for non-urgent communication (7AM-7PM)  If 7PM-7AM, please contact night-coverage [email or web address], 06/08/2024, 11:00 AM

## 2024-06-08 NOTE — Anesthesia Preprocedure Evaluation (Signed)
 Anesthesia Evaluation  Patient identified by MRN, date of birth, ID band Patient awake    Reviewed: Allergy & Precautions, Patient's Chart, lab work & pertinent test results  History of Anesthesia Complications (+) PONV and history of anesthetic complications  Airway Mallampati: II  TM Distance: >3 FB Neck ROM: Full    Dental no notable dental hx. (+) Dental Advisory Given, Teeth Intact   Pulmonary neg pulmonary ROS   Pulmonary exam normal        Cardiovascular hypertension, Normal cardiovascular exam+ dysrhythmias Atrial Fibrillation  Rhythm:Irregular Rate:Normal  Echo:   1. Left ventricular ejection fraction, by estimation, is 60 to 65%. The  left ventricle has normal function. The left ventricle has no regional  wall motion abnormalities. There is mild left ventricular hypertrophy.  Left ventricular diastolic parameters  are consistent with Grade I diastolic dysfunction (impaired relaxation).   2. Right ventricular systolic function is normal. The right ventricular  size is normal. There is normal pulmonary artery systolic pressure. The  estimated right ventricular systolic pressure is 28.4 mmHg.   3. The mitral valve is normal in structure. Trivial mitral valve  regurgitation. No evidence of mitral stenosis.   4. The aortic valve is tricuspid. There is mild calcification of the  aortic valve. Aortic valve regurgitation is not visualized. Aortic valve  sclerosis is present, with no evidence of aortic valve stenosis.   5. The inferior vena cava is normal in size with greater than 50%  respiratory variability, suggesting right atrial pressure of 3 mmHg.     Neuro/Psych  PSYCHIATRIC DISORDERS Anxiety Depression    negative neurological ROS     GI/Hepatic Neg liver ROS,GERD  ,,  Endo/Other  Hypothyroidism    Renal/GU negative Renal ROS     Musculoskeletal  (+) Arthritis , Osteoarthritis,    Abdominal   Peds   Hematology negative hematology ROS (+)   Anesthesia Other Findings   Reproductive/Obstetrics                              Anesthesia Physical Anesthesia Plan  ASA: 3  Anesthesia Plan: General   Post-op Pain Management: Dilaudid  IV   Induction: Intravenous  PONV Risk Score and Plan: 4 or greater and Ondansetron , Treatment may vary due to age or medical condition and Dexamethasone   Airway Management Planned: LMA  Additional Equipment: None  Intra-op Plan:   Post-operative Plan: Extubation in OR  Informed Consent: I have reviewed the patients History and Physical, chart, labs and discussed the procedure including the risks, benefits and alternatives for the proposed anesthesia with the patient or authorized representative who has indicated his/her understanding and acceptance.     Dental advisory given  Plan Discussed with: CRNA  Anesthesia Plan Comments:         Anesthesia Quick Evaluation

## 2024-06-08 NOTE — Plan of Care (Signed)

## 2024-06-08 NOTE — Plan of Care (Signed)
   Problem: Education: Goal: Knowledge of General Education information will improve Description: Including pain rating scale, medication(s)/side effects and non-pharmacologic comfort measures Outcome: Progressing   Problem: Clinical Measurements: Goal: Ability to maintain clinical measurements within normal limits will improve Outcome: Progressing Goal: Diagnostic test results will improve Outcome: Progressing

## 2024-06-09 ENCOUNTER — Inpatient Hospital Stay (HOSPITAL_COMMUNITY): Admitting: Anesthesiology

## 2024-06-09 ENCOUNTER — Inpatient Hospital Stay (HOSPITAL_COMMUNITY)

## 2024-06-09 ENCOUNTER — Encounter (HOSPITAL_COMMUNITY)
Admission: EM | Disposition: A | Discharge status: Home / Self Care | Payer: Self-pay | Source: Home / Self Care | Attending: Family Medicine

## 2024-06-09 ENCOUNTER — Other Ambulatory Visit: Payer: Self-pay

## 2024-06-09 ENCOUNTER — Encounter (HOSPITAL_COMMUNITY): Payer: Self-pay | Admitting: Internal Medicine

## 2024-06-09 DIAGNOSIS — F418 Other specified anxiety disorders: Secondary | ICD-10-CM

## 2024-06-09 DIAGNOSIS — N132 Hydronephrosis with renal and ureteral calculous obstruction: Secondary | ICD-10-CM

## 2024-06-09 DIAGNOSIS — E039 Hypothyroidism, unspecified: Secondary | ICD-10-CM

## 2024-06-09 DIAGNOSIS — N200 Calculus of kidney: Secondary | ICD-10-CM | POA: Diagnosis not present

## 2024-06-09 DIAGNOSIS — I10 Essential (primary) hypertension: Secondary | ICD-10-CM

## 2024-06-09 HISTORY — PX: CYSTOSCOPY/RETROGRADE/URETEROSCOPY/STONE EXTRACTION WITH BASKET: SHX5317

## 2024-06-09 HISTORY — PX: CYSTOSCOPY WITH RETROGRADE PYELOGRAM, URETEROSCOPY AND STENT PLACEMENT: SHX5789

## 2024-06-09 SURGERY — CYSTOURETEROSCOPY, WITH RETROGRADE PYELOGRAM AND STENT INSERTION
Anesthesia: General | Site: Ureter | Laterality: Left

## 2024-06-09 MED ORDER — FENTANYL CITRATE PF 50 MCG/ML IJ SOSY: 25.0000 ug | PREFILLED_SYRINGE | INTRAMUSCULAR | Status: DC | PRN

## 2024-06-09 MED ORDER — WATER FOR IRRIGATION, STERILE IR SOLN
Status: DC | PRN
  Administered 2024-06-09: 500 mL

## 2024-06-09 MED ORDER — FENTANYL CITRATE (PF) 100 MCG/2ML IJ SOLN
INTRAMUSCULAR | Status: DC | PRN
  Administered 2024-06-09: 100 ug via INTRAVENOUS

## 2024-06-09 MED ORDER — PROPOFOL 10 MG/ML IV BOLUS
INTRAVENOUS | Status: DC | PRN
Start: 2024-06-09 — End: 2024-06-09
  Administered 2024-06-09: 120 mg via INTRAVENOUS

## 2024-06-09 MED ORDER — LACTATED RINGERS IV SOLN: INTRAVENOUS | Status: DC

## 2024-06-09 MED ORDER — DIATRIZOATE MEGLUMINE 30 % UR SOLN
URETHRAL | Status: DC | PRN
  Administered 2024-06-09: 5 mL via URETHRAL

## 2024-06-09 MED ORDER — CHLORHEXIDINE GLUCONATE 0.12 % MT SOLN
15.0000 mL | Freq: Once | OROMUCOSAL | Status: AC
  Administered 2024-06-09: 15 mL via OROMUCOSAL

## 2024-06-09 MED ORDER — FENTANYL CITRATE (PF) 100 MCG/2ML IJ SOLN
INTRAMUSCULAR | Status: AC
  Filled 2024-06-09: qty 2

## 2024-06-09 MED ORDER — DEXAMETHASONE SODIUM PHOSPHATE 10 MG/ML IJ SOLN
INTRAMUSCULAR | Status: DC | PRN
  Administered 2024-06-09: 4 mg via INTRAVENOUS

## 2024-06-09 MED ORDER — DIATRIZOATE MEGLUMINE 30 % UR SOLN
URETHRAL | Status: AC
  Filled 2024-06-09: qty 100

## 2024-06-09 MED ORDER — PROPOFOL 10 MG/ML IV BOLUS
INTRAVENOUS | Status: AC
  Filled 2024-06-09: qty 20

## 2024-06-09 MED ORDER — ORAL CARE MOUTH RINSE: 15.0000 mL | Freq: Once | OROMUCOSAL | Status: AC

## 2024-06-09 MED ORDER — SODIUM CHLORIDE 0.9 % IR SOLN
Status: DC | PRN
  Administered 2024-06-09: 3000 mL

## 2024-06-09 MED ORDER — ONDANSETRON HCL 4 MG PO TABS: 4.0000 mg | ORAL_TABLET | Freq: Every day | ORAL | 1 refills | Status: DC | PRN

## 2024-06-09 MED ORDER — HYDROCODONE-ACETAMINOPHEN 5-325 MG PO TABS: 1.0000 | ORAL_TABLET | Freq: Four times a day (QID) | ORAL | 0 refills | Status: AC | PRN

## 2024-06-09 SURGICAL SUPPLY — 23 items
BAG DRAIN URO TABLE W/ADPT NS (BAG) ×3 IMPLANT
BAG HAMPER (MISCELLANEOUS) ×3 IMPLANT
CATH INTERMIT  6FR 70CM (CATHETERS) ×3 IMPLANT
CLOTH BEACON ORANGE TIMEOUT ST (SAFETY) ×3 IMPLANT
EXTRACTOR STONE NITINOL NGAGE (UROLOGICAL SUPPLIES) ×1 IMPLANT
GLOVE BIO SURGEON STRL SZ8 (GLOVE) ×3 IMPLANT
GLOVE BIOGEL PI IND STRL 6.5 (GLOVE) ×1 IMPLANT
GLOVE BIOGEL PI IND STRL 7.0 (GLOVE) ×6 IMPLANT
GOWN STRL REUS W/TWL LRG LVL3 (GOWN DISPOSABLE) ×3 IMPLANT
GOWN STRL REUS W/TWL XL LVL3 (GOWN DISPOSABLE) ×3 IMPLANT
GUIDEWIRE STR DUAL SENSOR (WIRE) ×3 IMPLANT
GUIDEWIRE STR ZIPWIRE 035X150 (MISCELLANEOUS) ×3 IMPLANT
KIT TURNOVER CYSTO (KITS) ×3 IMPLANT
MANIFOLD NEPTUNE II (INSTRUMENTS) ×3 IMPLANT
PACK CYSTO (CUSTOM PROCEDURE TRAY) ×3 IMPLANT
PAD ARMBOARD POSITIONER FOAM (MISCELLANEOUS) ×3 IMPLANT
POSITIONER HEAD 8X9X4 ADT (SOFTGOODS) ×3 IMPLANT
SOL .9 NS 3000ML IRR UROMATIC (IV SOLUTION) ×5 IMPLANT
STENT URET 6FRX26 CONTOUR (STENTS) ×1 IMPLANT
SYR 10ML LL (SYRINGE) ×3 IMPLANT
SYR CONTROL 10ML LL (SYRINGE) ×3 IMPLANT
TOWEL OR 17X26 4PK STRL BLUE (TOWEL DISPOSABLE) ×3 IMPLANT
WATER STERILE IRR 500ML POUR (IV SOLUTION) ×3 IMPLANT

## 2024-06-09 NOTE — Plan of Care (Signed)

## 2024-06-09 NOTE — Anesthesia Postprocedure Evaluation (Signed)
 Anesthesia Post Note  Patient: [name]  Procedure(s) Performed: ERNESTA, WITH RETROGRADE PYELOGRAM AND STENT INSERTION (Left: Ureter) CYSTOSCOPY, WITH CALCULUS REMOVAL USING BASKET (Ureter)  Patient location during evaluation: PACU Anesthesia Type: General Level of consciousness: awake and alert Pain management: pain level controlled Vital Signs Assessment: post-procedure vital signs reviewed and stable Respiratory status: spontaneous breathing, nonlabored ventilation, respiratory function stable and patient connected to nasal cannula oxygen Cardiovascular status: blood pressure returned to baseline and stable Postop Assessment: no apparent nausea or vomiting Anesthetic complications: no   There were no known notable events for this encounter.   Last Vitals:  Vitals:   06/09/24 1430 06/09/24 1450  BP: (!) 127/56 136/64  Pulse: 76 83  Resp: 18 17  Temp: 36.9 C 37.1 C  SpO2: 96% 93%    Last Pain:  Vitals:   06/09/24 1450  TempSrc: Oral  PainSc:                  [name]

## 2024-06-09 NOTE — Transfer of Care (Signed)
 Immediate Anesthesia Transfer of Care Note  Patient: [name]  Procedure(s) Performed: ERNESTA, WITH RETROGRADE PYELOGRAM AND STENT INSERTION (Left: Ureter) CYSTOSCOPY, WITH CALCULUS REMOVAL USING BASKET (Ureter)  Patient Location: PACU  Anesthesia Type:General  Level of Consciousness: awake, alert , oriented, and patient cooperative  Airway & Oxygen Therapy: Patient Spontanous Breathing  Post-op Assessment: Report given to RN, Post -op Vital signs reviewed and stable, and Patient moving all extremities X 4  Post vital signs: Reviewed and stable  Last Vitals:  Vitals Value Taken Time  BP 126/54 06/09/24 14:00  Temp 37.3 C 06/09/24 14:00  Pulse 83 06/09/24 14:01  Resp 19 06/09/24 14:01  SpO2 90 % 06/09/24 14:01  Vitals shown include unfiled device data.  Last Pain:  Vitals:   06/09/24 1224  TempSrc: Oral  PainSc: 10-Worst pain ever         Complications: No notable events documented.

## 2024-06-09 NOTE — Progress Notes (Signed)
 PROGRESS NOTE    Patient: [name]                            PCP: Rosamond Leta NOVAK, MD                    DOB: 1946/01/05            DOA: 06/07/2024 [id number]             DOS: 06/09/2024, 10:52 AM   LOS: 1 day   Date of Service: The patient was seen and examined on 06/09/2024  Subjective:   The patient was seen and examined this morning, hemodynamically stable no acute distress n.p.o. in anticipation of cystoscopy and instrumentation today Still complaining of abdominal/flank pain-but tolerable with IV analgesics  Brief Narrative:   [name] is a 78 y.o. year old female with past medical history of hypertension, hyperlipidemia, atrial fibrillation, GERD, and hypothyroidism.  He presents to [name] ED reports of left-sided flank pain hematuria that has been ongoing for about 3 weeks.  She was seen in [facility] ED on 06/01/24 for the same.  She has taken Cipro with no improvement in symptoms, then completed a course of Macrobid, and finally completed a 1 week course of Levaquin with last dose being yesterday.  She continues to have left flank pain and hematuria after completion of the antibiotics.  She endorses nausea, vomiting, and a mild burning sensation with urination.  Denies abdominal pain.   ED Course: afebrile temp 36.8 C, BP 156/65, HR 78, RR 18, SPO2 90% on room air.   CXR obtained and negative for any active cardiopulmonary disease.   CT renal stone study obtained and shows 0.3 cm obstructing left ureteral stone located in the distal ureter near the UVJ and associated left hydronephrosis, prior L1 vertebral body compression fracture seen again.  Labs notable for UA without signs of infection, creatinine 1.32, alkaline phosphatase 146 with normal AST/ALT, and WBC 11.4.  She was given Diflucan , Norco, and 1 L normal saline bolus. [facility] contacted for admission.    Assessment & Plan:   Principal Problem:   Nephrolithiasis Active Problems:   Hypertension   High  cholesterol   GERD (gastroesophageal reflux disease)   Hypothyroidism     Nephrolithiasis -Hemodynamically stable -Still having left flank pain improved with IV analgesics -Prolonged pain and discomfort  - Urologist Dr. Sherrilee following-anticipate intervention 06/09/2024 -N.p.o., on IVF  -Continuing Flomax , as needed analgesics, antiemetics  CT renal stone with 0.3 cm obstructing left ureteral stone in the distal ureter near the UVJ with left hydronephrosis.   -Patient has completed recent outpatient courses of Cipro, Macrobid, and Levaquin. - UA clean still complaining of dysuria continuing IV ABX   Hematuria HGB 14.1 >> 12.6, 13.6 - Recheck H&H this evening-remained stable - Hold home Eliquis  - Treatment as above   Acute kidney injury - Monitoring-improving Lab Results  Component Value Date   CREATININE 1.14 (H) 06/08/2024   CREATININE 1.32 (H) 06/07/2024   CREATININE 0.88 06/01/2024    - Continue maintenance IV fluid - Avoid nephrotoxins, contrast Dyes, Hypotension and Dehydration  - Continue to Monitor and Trend Renal Function carefully and repeat panel in the AM  Dysuria -UA negative for nitrites, or leukocyte esterase, 5 ketones, hemoglobin, urine WBC >  50 - Complaining of pain and burning with urination, - Will follow the urine culture - Continue IV antibiotics  of Rocephin    Chronic medical issues:  Hypertension BP currently soft, 113/55 - Holding home losartan   Hyperlipidemia  - Continue home Crestor    Atrial fibrillation - Continue home diltiazem  - Hold home Eliquis  pending urology consultation and possible stent placement   Hypothyroidism -  Continue home Synthroid    GERD-  Protonix       Nutritional status:  The patient's BMI is: Body mass index is 30.86 kg/m. I agree with the assessment and plan as outlined   Cultures; Blood Cultures x 2 >> Urine Culture  >>>      ------------------------------------------------------------------------------------------------------------------------------------------------  DVT prophylaxis:  Place and maintain sequential compression device Start: 06/09/24 0848 Place and maintain sequential compression device Start: 06/07/24 1715   Code Status:   Code Status: Full Code  Family Communication: No family member present at bedside-  -Advance care planning has been discussed.   Admission status:   Status is: Inpatient Patient meets inpatient criteria needing urological management possible stenting in lithotripsy IV antibiotics  Disposition: From  - home             Planning for discharge in 1-2 days   Procedures:   No admission procedures for hospital encounter.   Antimicrobials:  Anti-infectives (From admission, onward)    Start     Dose/Rate Route Frequency Ordered Stop   06/07/24 1900  cefTRIAXone  (ROCEPHIN ) 1 g in sodium chloride  0.9 % 100 mL IVPB        1 g 200 mL/hr over 30 Minutes Intravenous Daily-1800 06/07/24 1806     06/07/24 1400  fluconazole  (DIFLUCAN ) tablet 200 mg        200 mg Oral  Once 06/07/24 1355 06/07/24 1409        Medication:   acidophilus  1 capsule Oral Daily   cholecalciferol   4,000 Units Oral Daily   citalopram   20 mg Oral Daily   diltiazem   240 mg Oral Daily   levothyroxine   75 mcg Oral QAC breakfast   magnesium  oxide  400 mg Oral Daily   melatonin  3 mg Oral QHS   multivitamin with minerals  1 tablet Oral Daily   olopatadine   1 drop Both Eyes Daily   pantoprazole   40 mg Oral Daily   rosuvastatin   5 mg Oral Daily   tamsulosin   0.4 mg Oral Daily    acetaminophen  **OR** acetaminophen , ALPRAZolam , HYDROcodone -acetaminophen , metoprolol  tartrate, morphine  injection, ondansetron  **OR** ondansetron  (ZOFRAN ) IV, polyethylene glycol   Objective:   Vitals:   06/08/24 0600 06/08/24 1346 06/08/24 2014 06/09/24 0526  BP:  120/60 130/64 132/75  Pulse:  68 83 83   Resp:   18 18  Temp:  98.7 F (37.1 C) 98.7 F (37.1 C) 98.9 F (37.2 C)  TempSrc:  Oral Oral Oral  SpO2: 94% 97% 91% 93%  Weight:      Height:        Intake/Output Summary (Last 24 hours) at 06/09/2024 1052 Last data filed at 06/09/2024 0500 Gross per 24 hour  Intake 440 ml  Output 900 ml  Net -460 ml   Filed Weights   06/07/24 1104 06/07/24 1635  Weight: 75.3 kg 76.5 kg     Physical examination:        General:  AAO x 3,  cooperative, no distress;   HEENT:  Normocephalic, PERRL, otherwise with in Normal limits   Neuro:  CNII-XII intact. , normal motor and sensation, reflexes intact   Lungs:   Clear to auscultation BL, Respirations unlabored,  No wheezes / crackles  Cardio:    S1/S2, RRR, No murmure, No Rubs or Gallops   Abdomen:  Soft, non-tender, bowel sounds active all four quadrants, no guarding or peritoneal signs.  Muscular  skeletal:  Positive CVA tenderness  Limited exam -global generalized weaknesses - in bed, able to move all 4 extremities,   2+ pulses,  symmetric, No pitting edema  Skin:  Dry, warm to touch, negative for any Rashes,  Wounds: Please see nursing documentation          ------------------------------------------------------------------------------------------------------------------------------------------    LABs:     Latest Ref Rng & Units 06/08/2024    4:25 AM 06/07/2024    9:30 PM 06/07/2024   11:40 AM  CBC  WBC 4.0 - 10.5 K/uL 13.6   11.4   Hemoglobin 12.0 - 15.0 g/dL 86.3  87.3  85.8   Hematocrit 36.0 - 46.0 % 40.7  37.6  41.8   Platelets 150 - 400 K/uL 264   268       Latest Ref Rng & Units 06/08/2024    4:25 AM 06/07/2024   11:40 AM 06/01/2024    5:35 PM  CMP  Glucose 70 - 99 mg/dL 877  892  888   BUN 8 - 23 mg/dL 10  11  16    Creatinine 0.44 - 1.00 mg/dL 8.85  8.67  9.11   Sodium 135 - 145 mmol/L 135  137  135   Potassium 3.5 - 5.1 mmol/L 4.1  3.9  3.8   Chloride 98 - 111 mmol/L 101  96  99   CO2 22 - 32 mmol/L  26  26  23    Calcium  8.9 - 10.3 mg/dL 9.2  9.7  9.9   Total Protein 6.5 - 8.1 g/dL  7.0  8.0   Total Bilirubin 0.0 - 1.2 mg/dL  1.1  0.9   Alkaline Phos 38 - 126 U/L  146  96   AST 15 - 41 U/L  31  30   ALT 0 - 44 U/L  23  22        Micro Results Recent Results (from the past 240 hours)  Urine Culture     Status: Abnormal   Collection Time: 06/01/24  5:28 PM   Specimen: Urine, Clean Catch  Result Value Ref Range Status   Specimen Description   Final    URINE, CLEAN CATCH Performed at [facility], [address], [place], [place] [address]    Special Requests   Final    NONE Performed at [facility], [address] [place], [place] [address]    Culture MULTIPLE SPECIES PRESENT, SUGGEST RECOLLECTION (A)  Final   Report Status 06/02/2024 FINAL  Final  Urine Culture     Status: None   Collection Time: 06/07/24  2:10 PM   Specimen: Urine, Clean Catch  Result Value Ref Range Status   Specimen Description   Final    URINE, CLEAN CATCH Performed at [facility], [address] [place], [place] [address]    Special Requests   Final    NONE Performed at [facility], [address] [place], [place] [address]    Culture   Final    NO GROWTH Performed at [facility], [address] [place], [place] [address]    Report Status 06/08/2024 FINAL  Final    Radiology Reports No results found.   SIGNED: Adriana DELENA Grams, MD, FHM. FAAFP. Jolynn Pack - [facility] Time spent -  55 min.  In seeing, evaluating and examining the patient. Reviewing medical records, labs, drawn plan of care. [facility],  Pager (please use [email or web address] to page/ text) Please use Epic Secure Chat for non-urgent communication (7AM-7PM)  If 7PM-7AM, please contact night-coverage [email or web address], 06/09/2024, 10:52 AM

## 2024-06-09 NOTE — Progress Notes (Signed)
 Patient transported to room 330. NAD.

## 2024-06-09 NOTE — Consult Note (Signed)
 Urology Consult  Referring physician: Dr. Twana Reason for referral: left ureteral calculus  Chief Complaint: left flank pain  History of Present Illness: Ms Haney is a 78yo with a history of afib, HTN. HLD who presented to the ER yesterday with worsening left flank pain. He r left flank pain started 3 weeks ago and she presented to her PCP. She was treated for a UTI but her LUTS and flank pain failed to improve. Her pain is sharp, mild to moderate, intermittent and nonradiating. She denies any nausea/vomiting. She has urinary urgency, frequency and intermittent dysuria. CT in the ER shows a 3mm left distal ureteral calculus with mild hydronephrosis. This is her first stone event. No fevers/chills/sweats. WBC count 13.6. urine culture negative  Past Medical History:  Diagnosis Date   Anxiety    Arthritis    Atrial fibrillation [facility])    Documented December 2019   Complication of anesthesia    slow to wake up   Depression    GERD (gastroesophageal reflux disease)    Heart murmur    Hyperlipidemia    Hypertension    Hypothyroidism    PONV (postoperative nausea and vomiting)    Past Surgical History:  Procedure Laterality Date   APPENDECTOMY     ATRIAL FIBRILLATION ABLATION N/A 04/05/2024   Procedure: ATRIAL FIBRILLATION ABLATION;  Surgeon: Nancey Eulas BRAVO, MD;  Location: [facility];  Service: Cardiovascular;  Laterality: N/A;   BUNIONECTOMY Bilateral    CATARACT EXTRACTION W/PHACO Right 10/09/2017   Procedure: CATARACT EXTRACTION PHACO AND INTRAOCULAR LENS PLACEMENT (IOC);  Surgeon: Harrie Agent, MD;  Location: [facility] ORS;  Service: Ophthalmology;  Laterality: Right;  CDE: 6.65   CATARACT EXTRACTION W/PHACO Left 12/11/2017   Procedure: CATARACT EXTRACTION PHACO AND INTRAOCULAR LENS PLACEMENT (IOC);  Surgeon: Harrie Agent, MD;  Location: [facility] OR[facility];  Service: Ophthalmology;  Laterality: Left;  CDE: 5.28   COLONOSCOPY  06/18/2012   Procedure: COLONOSCOPY;  Surgeon: Claudis RAYMOND Rivet, MD;  Location: [facility] ENDO SUITE;  Service: Endoscopy;  Laterality: N/A;  730   COLONOSCOPY N/A 07/04/2019   Rehman: one 7 mm polyp in proximal ascending colon, removed, clips placed to treat postpolypectomy bleed (tubular adenoma), diverticulosis in sigmoid colon, hepatic flexure, external hemorrhoids   ESOPHAGOGASTRODUODENOSCOPY     Hammer toe repair Left    HEMORRHOID SURGERY     KNEE ARTHROSCOPY     left 06-2019   REVERSE SHOULDER ARTHROPLASTY Right 07/12/2021   Procedure: REVERSE SHOULDER ARTHROPLASTY;  Surgeon: Kay Kemps, MD;  Location: [facility];  Service: Orthopedics;  Laterality: Right;  with ISB   SHOULDER ARTHROSCOPY     right   THYROIDECTOMY     TONSILLECTOMY     TOTAL ABDOMINAL HYSTERECTOMY     TOTAL KNEE ARTHROPLASTY Right 06/29/2020   Procedure: TOTAL KNEE ARTHROPLASTY;  Surgeon: Kay Kemps, MD;  Location: [facility];  Service: Orthopedics;  Laterality: Right;  adductor canal   TOTAL KNEE ARTHROPLASTY Left 11/13/2023   Procedure: TOTAL KNEE ARTHROPLASTY;  Surgeon: Kay Kemps, MD;  Location: [facility];  Service: Orthopedics;  Laterality: Left;  general anesthesia 120 flip room   TOTAL SHOULDER ARTHROPLASTY Left 02/06/2017   TOTAL SHOULDER ARTHROPLASTY Left 02/06/2017   Procedure: TOTAL SHOULDER ARTHROPLASTY;  Surgeon: Kemps Kay, MD;  Location: [facility] OR;  Service: Orthopedics;  Laterality: Left;    Medications: I have reviewed the patient's current medications. Allergies:  Allergies  Allergen Reactions   Other Nausea And Vomiting    Opiate based pain medicines  Latex Rash   Lexiscan  [Regadenoson ] Nausea Only and Other (See Comments)    See 12/14/2023 Lexiscan  report for details-hypotension and nausea   Penicillins Rash   Sulfa Antibiotics Rash   Tramadol Itching    Family History  Problem Relation Age of Onset   Atrial fibrillation Sister    Colon cancer Neg Hx    Breast cancer Neg Hx    Parkinson's disease Neg Hx    Tremor Neg Hx    Social History:   reports that she has never smoked. She has never been exposed to tobacco smoke. She has never used smokeless tobacco. She reports that she does not drink alcohol  and does not use drugs.  Review of Systems  Genitourinary:  Positive for dysuria, flank pain, frequency and urgency.  All other systems reviewed and are negative.   Physical Exam:  Vital signs in last 24 hours: Temp:  [98.7 F (37.1 C)-98.9 F (37.2 C)] 98.9 F (37.2 C) (07/17 0526) Pulse Rate:  [68-83] 83 (07/17 0526) Resp:  [18] 18 (07/17 0526) BP: (120-132)/(60-75) 132/75 (07/17 0526) SpO2:  [91 %-97 %] 93 % (07/17 0526) Physical Exam Vitals reviewed.  Constitutional:      Appearance: Normal appearance.  HENT:     Head: Normocephalic and atraumatic.     Mouth/Throat:     Mouth: Mucous membranes are dry.  Eyes:     Extraocular Movements: Extraocular movements intact.     Pupils: Pupils are equal, round, and reactive to light.  Cardiovascular:     Rate and Rhythm: Normal rate and regular rhythm.  Pulmonary:     Effort: Pulmonary effort is normal. No respiratory distress.  Abdominal:     General: Abdomen is flat. There is no distension.  Musculoskeletal:        General: No swelling. Normal range of motion.     Cervical back: Normal range of motion and neck supple.  Skin:    General: Skin is warm and dry.  Neurological:     General: No focal deficit present.     Mental Status: She is alert and oriented to person, place, and time.  Psychiatric:        Mood and Affect: Mood normal.        Behavior: Behavior normal.        Thought Content: Thought content normal.        Judgment: Judgment normal.     Laboratory Data:  Results for orders placed or performed during the hospital encounter of 06/07/24 (from the past 72 hours)  Urinalysis, Routine w reflex microscopic -Urine, Clean Catch     Status: Abnormal   Collection Time: 06/07/24 11:15 AM  Result Value Ref Range   Color, Urine AMBER (A) YELLOW    Comment:  BIOCHEMICALS MAY BE AFFECTED BY COLOR   APPearance CLOUDY (A) CLEAR   Specific Gravity, Urine 1.032 (H) 1.005 - 1.030   pH 5.0 5.0 - 8.0   Glucose, UA NEGATIVE NEGATIVE mg/dL   Hgb urine dipstick MODERATE (A) NEGATIVE   Bilirubin Urine SMALL (A) NEGATIVE   Ketones, ur 5 (A) NEGATIVE mg/dL   Protein, ur 899 (A) NEGATIVE mg/dL   Nitrite NEGATIVE NEGATIVE   Leukocytes,Ua NEGATIVE NEGATIVE   RBC / HPF >50 0 - 5 RBC/hpf   WBC, UA >50 0 - 5 WBC/hpf   Bacteria, UA NONE SEEN NONE SEEN   Squamous Epithelial / HPF 6-10 0 - 5 /HPF   WBC Clumps PRESENT    Mucus PRESENT  Ca Oxalate Crys, UA PRESENT    Non Squamous Epithelial 0-5 (A) NONE SEEN    Comment: Performed at [facility], [address] [place], [place] [address]  Lipase, blood     Status: None   Collection Time: 06/07/24 11:40 AM  Result Value Ref Range   Lipase 24 11 - 51 U/L    Comment: Performed at [facility], [address] [place], [place] [address]  Comprehensive metabolic panel     Status: Abnormal   Collection Time: 06/07/24 11:40 AM  Result Value Ref Range   Sodium 137 135 - 145 mmol/L   Potassium 3.9 3.5 - 5.1 mmol/L   Chloride 96 (L) 98 - 111 mmol/L   CO2 26 22 - 32 mmol/L   Glucose, Bld 107 (H) 70 - 99 mg/dL    Comment: Glucose reference range applies only to samples taken after fasting for at least 8 hours.   BUN 11 8 - 23 mg/dL   Creatinine, Ser 8.67 (H) 0.44 - 1.00 mg/dL   Calcium  9.7 8.9 - 10.3 mg/dL   Total Protein 7.0 6.5 - 8.1 g/dL   Albumin 3.4 (L) 3.5 - 5.0 g/dL   AST 31 15 - 41 U/L   ALT 23 0 - 44 U/L   Alkaline Phosphatase 146 (H) 38 - 126 U/L   Total Bilirubin 1.1 0.0 - 1.2 mg/dL   GFR, Estimated 41 (L) >60 mL/min    Comment: (NOTE) Calculated using the CKD-EPI Creatinine Equation (2021)    Anion gap 15 5 - 15    Comment: Performed at [facility], [address], [place], [place] [address]  CBC with Differential     Status: Abnormal   Collection Time: 06/07/24 11:40 AM  Result Value Ref  Range   WBC 11.4 (H) 4.0 - 10.5 K/uL   RBC 4.43 3.87 - 5.11 MIL/uL   Hemoglobin 14.1 12.0 - 15.0 g/dL   HCT 58.1 63.9 - 53.9 %   MCV 94.4 80.0 - 100.0 fL   MCH 31.8 26.0 - 34.0 pg   MCHC 33.7 30.0 - 36.0 g/dL   RDW 87.3 88.4 - 84.4 %   Platelets 268 150 - 400 K/uL   nRBC 0.0 0.0 - 0.2 %   Neutrophils Relative % 68 %   Neutro Abs 7.7 1.7 - 7.7 K/uL   Lymphocytes Relative 11 %   Lymphs Abs 1.3 0.7 - 4.0 K/uL   Monocytes Relative 20 %   Monocytes Absolute 2.3 (H) 0.1 - 1.0 K/uL   Eosinophils Relative 0 %   Eosinophils Absolute 0.0 0.0 - 0.5 K/uL   Basophils Relative 1 %   Basophils Absolute 0.1 0.0 - 0.1 K/uL   Immature Granulocytes 0 %   Abs Immature Granulocytes 0.04 0.00 - 0.07 K/uL    Comment: Performed at [facility], [address], [place], [place] [address]  Troponin I (High Sensitivity)     Status: None   Collection Time: 06/07/24 11:40 AM  Result Value Ref Range   Troponin I (High Sensitivity) 7 <18 ng/L    Comment: (NOTE) Elevated high sensitivity troponin I (hsTnI) values and significant  changes across serial measurements may suggest ACS but many other  chronic and acute conditions are known to elevate hsTnI results.  Refer to the Links section for chest pain algorithms and additional  guidance. Performed at [facility], [address], [place], [place] [address]   Urine Culture     Status: None   Collection Time:  06/07/24  2:10 PM   Specimen: Urine, Clean Catch  Result Value Ref Range   Specimen Description      URINE, CLEAN CATCH Performed at [facility], [address] [place], [place] [address]    Special Requests      NONE Performed at [facility], [address], [place], [place] [address]    Culture      NO GROWTH Performed at [facility], [address], [place], [place] [address]    Report Status 06/08/2024 FINAL   Hemoglobin and hematocrit, blood     Status: None   Collection Time: 06/07/24  9:30 PM  Result Value Ref Range    Hemoglobin 12.6 12.0 - 15.0 g/dL   HCT 62.3 63.9 - 53.9 %    Comment: Performed at [facility], [address] [place], [place] [address]  CBC     Status: Abnormal   Collection Time: 06/08/24  4:25 AM  Result Value Ref Range   WBC 13.6 (H) 4.0 - 10.5 K/uL   RBC 4.31 3.87 - 5.11 MIL/uL   Hemoglobin 13.6 12.0 - 15.0 g/dL   HCT 59.2 63.9 - 53.9 %   MCV 94.4 80.0 - 100.0 fL   MCH 31.6 26.0 - 34.0 pg   MCHC 33.4 30.0 - 36.0 g/dL   RDW 87.2 88.4 - 84.4 %   Platelets 264 150 - 400 K/uL   nRBC 0.0 0.0 - 0.2 %    Comment: Performed at [facility], [address], [place], [place] [address]  Basic metabolic panel     Status: Abnormal   Collection Time: 06/08/24  4:25 AM  Result Value Ref Range   Sodium 135 135 - 145 mmol/L   Potassium 4.1 3.5 - 5.1 mmol/L   Chloride 101 98 - 111 mmol/L   CO2 26 22 - 32 mmol/L   Glucose, Bld 122 (H) 70 - 99 mg/dL    Comment: Glucose reference range applies only to samples taken after fasting for at least 8 hours.   BUN 10 8 - 23 mg/dL   Creatinine, Ser 8.85 (H) 0.44 - 1.00 mg/dL   Calcium  9.2 8.9 - 10.3 mg/dL   GFR, Estimated 49 (L) >60 mL/min    Comment: (NOTE) Calculated using the CKD-EPI Creatinine Equation (2021)    Anion gap 8 5 - 15    Comment: Performed at [facility], [address] [place], [place] [address]  Magnesium      Status: None   Collection Time: 06/08/24  4:25 AM  Result Value Ref Range   Magnesium  2.0 1.7 - 2.4 mg/dL    Comment: Performed at [facility], [address] [place], [place] [address]   Recent Results (from the past 240 hours)  Urine Culture     Status: Abnormal   Collection Time: 06/01/24  5:28 PM   Specimen: Urine, Clean Catch  Result Value Ref Range Status   Specimen Description   Final    URINE, CLEAN CATCH Performed at [facility], [address], [place], [place] [address]    Special Requests   Final    NONE Performed at [facility], [address], [place], [place] [address]    Culture MULTIPLE  SPECIES PRESENT, SUGGEST RECOLLECTION (A)  Final   Report Status 06/02/2024 FINAL  Final  Urine Culture     Status: None   Collection Time: 06/07/24  2:10 PM   Specimen: Urine, Clean Catch  Result Value Ref Range Status  Specimen Description   Final    URINE, CLEAN CATCH Performed at [facility], [address], [place], [place] [address]    Special Requests   Final    NONE Performed at [facility], [address], [place], [place] [address]    Culture   Final    NO GROWTH Performed at [facility], [address], [place], [place] [address]    Report Status 06/08/2024 FINAL  Final   Creatinine: Recent Labs    06/07/24 1140 06/08/24 0425  CREATININE 1.32* 1.14*   Baseline Creatinine: 1.1  Impression/Assessment:  78yo with a left ureteral calculus  Plan:  -We discussed the management of kidney stones. These options include observation, ureteroscopy, shockwave lithotripsy (ESWL) and percutaneous nephrolithotomy (PCNL). We discussed which options are relevant to the patient's stone(s). We discussed the natural history of kidney stones as well as the complications of untreated stones and the impact on quality of life without treatment as well as with each of the above listed treatments. We also discussed the efficacy of each treatment in its ability to clear the stone burden. With any of these management options I discussed the signs and symptoms of infection and the need for emergent treatment should these be experienced. For each option we discussed the ability of each procedure to clear the patient of their stone burden.   For observation I described the risks which include but are not limited to silent renal damage, life-threatening infection, need for emergent surgery, failure to pass stone and pain.   For ureteroscopy I described the risks which include bleeding, infection, damage to contiguous structures, positioning injury, ureteral stricture, ureteral avulsion,  ureteral injury, need for prolonged ureteral stent, inability to perform ureteroscopy, need for an interval procedure, inability to clear stone burden, stent discomfort/pain, heart attack, stroke, pulmonary embolus and the inherent risks with general anesthesia.   For shockwave lithotripsy I described the risks which include arrhythmia, kidney contusion, kidney hemorrhage, need for transfusion, pain, inability to adequately break up stone, inability to pass stone fragments, Steinstrasse, infection associated with obstructing stones, need for alternate surgical procedure, need for repeat shockwave lithotripsy, MI, CVA, PE and the inherent risks with anesthesia/conscious sedation.   For PCNL I described the risks including positioning injury, pneumothorax, hydrothorax, need for chest tube, inability to clear stone burden, renal laceration, arterial venous fistula or malformation, need for embolization of kidney, loss of kidney or renal function, need for repeat procedure, need for prolonged nephrostomy tube, ureteral avulsion, MI, CVA, PE and the inherent risks of general anesthesia.   - The patient would like to proceed with left ureteroscopic stone extraction  Belvie Clara 06/09/2024, 7:29 AM

## 2024-06-09 NOTE — Op Note (Signed)
.  Preoperative diagnosis: Left ureteral stone  Postoperative diagnosis: Same  Procedure: 1 cystoscopy 2. Left retrograde pyelography 3.  Intraoperative fluoroscopy, under one hour, with interpretation 4.  Left ureteroscopic stone manipulation with basket extraction 5.  Left 6 x 26 JJ stent placement  Attending: Belvie Clara  Anesthesia: General  Estimated blood loss: None  Drains: Left 6 x 26 JJ ureteral stent without tether  Specimens: stone for analysis  Antibiotics: rocephin   Findings: left distal ureteral stone. Moderate hydronephrosis. No masses/lesions in the bladder. Ureteral orifices in normal anatomic location.  Indications: Patient is a 78 year old female with a history of left ureteral stone who has failed medical expulsive therapy  After discussing treatment options, she decided proceed with left ureteroscopic stone manipulation.  Procedure in detail: The patient was brought to the operating room and a brief timeout was done to ensure correct patient, correct procedure, correct site.  General anesthesia was administered patient was placed in dorsal lithotomy position.  Her genitalia was then prepped and draped in usual sterile fashion.  A rigid 22 French cystoscope was passed in the urethra and the bladder.  Bladder was inspected free masses or lesions.  the ureteral orifices were in the normal orthotopic locations.  a 6 french ureteral catheter was then instilled into the left ureteral orifice.  a gentle retrograde was obtained and findings noted above.  we then placed a zip wire through the ureteral catheter and advanced up to the renal pelvis.  we then removed the cystoscope and cannulated the left ureteral orifice with a semirigid ureteroscope.  We located the stone in the distal ureter which was removed with an NGage basket.  once the stone was removed we then placed a 6 x 26 double-j ureteral stent over the original zip wire. We then removed the wire and good coil was  noted in the the renal pelvis under fluoroscopy and the bladder under direct vision.    the bladder was then drained and this concluded the procedure which was well tolerated by patient.  Complications: None  Condition: Stable, extubated, transferred to PACU  Plan: Patient is to be discharged home as to follow-up in 1-2 weeks for stent removal.

## 2024-06-09 NOTE — Anesthesia Procedure Notes (Signed)
 Procedure Name: LMA Insertion Date/Time: 06/09/2024 1:31 PM  Performed by: Cordella Elvie HERO, CRNAPre-anesthesia Checklist: Patient identified, Emergency Drugs available, Suction available, Patient being monitored and Timeout performed Patient Re-evaluated:Patient Re-evaluated prior to induction Oxygen Delivery Method: Circle system utilized Preoxygenation: Pre-oxygenation with 100% oxygen Induction Type: IV induction LMA: LMA inserted LMA Size: 4.0 Number of attempts: 1 Placement Confirmation: positive ETCO2, CO2 detector and breath sounds checked- equal and bilateral Tube secured with: Tape Dental Injury: Teeth and Oropharynx as per pre-operative assessment

## 2024-06-10 ENCOUNTER — Encounter (HOSPITAL_COMMUNITY): Payer: Self-pay | Admitting: Urology

## 2024-06-10 DIAGNOSIS — N200 Calculus of kidney: Secondary | ICD-10-CM | POA: Diagnosis not present

## 2024-06-10 LAB — CBC
HCT: 37.1 % (ref 36.0–46.0)
Hemoglobin: 12.6 g/dL (ref 12.0–15.0)
MCH: 31.7 pg (ref 26.0–34.0)
MCHC: 34 g/dL (ref 30.0–36.0)
MCV: 93.5 fL (ref 80.0–100.0)
Platelets: 299 K/uL (ref 150–400)
RBC: 3.97 MIL/uL (ref 3.87–5.11)
RDW: 12.5 % (ref 11.5–15.5)
WBC: 11.8 K/uL — ABNORMAL HIGH (ref 4.0–10.5)
nRBC: 0 % (ref 0.0–0.2)

## 2024-06-10 LAB — COMPREHENSIVE METABOLIC PANEL WITH GFR
ALT: 18 U/L (ref 0–44)
AST: 22 U/L (ref 15–41)
Albumin: 2.4 g/dL — ABNORMAL LOW (ref 3.5–5.0)
Alkaline Phosphatase: 148 U/L — ABNORMAL HIGH (ref 38–126)
Anion gap: 8 (ref 5–15)
BUN: 18 mg/dL (ref 8–23)
CO2: 27 mmol/L (ref 22–32)
Calcium: 9.2 mg/dL (ref 8.9–10.3)
Chloride: 100 mmol/L (ref 98–111)
Creatinine, Ser: 0.95 mg/dL (ref 0.44–1.00)
GFR, Estimated: >60 mL/min (ref >60)
Glucose, Bld: 137 mg/dL — ABNORMAL HIGH (ref 70–99)
Potassium: 3.9 mmol/L (ref 3.5–5.1)
Sodium: 135 mmol/L (ref 135–145)
Total Bilirubin: 0.7 mg/dL (ref 0.0–1.2)
Total Protein: 6.1 g/dL — ABNORMAL LOW (ref 6.5–8.1)

## 2024-06-10 MED ORDER — PHENAZOPYRIDINE HCL 100 MG PO TABS: 100.0000 mg | ORAL_TABLET | Freq: Three times a day (TID) | ORAL | 0 refills | Status: DC | PRN

## 2024-06-10 MED ORDER — CEFUROXIME AXETIL 250 MG PO TABS: 500.0000 mg | ORAL_TABLET | Freq: Two times a day (BID) | ORAL | Status: DC

## 2024-06-10 MED ORDER — CEFUROXIME AXETIL 500 MG PO TABS: 500.0000 mg | ORAL_TABLET | Freq: Two times a day (BID) | ORAL | 0 refills | Status: AC

## 2024-06-10 MED ORDER — LACTINEX PO CHEW: 1.0000 | CHEWABLE_TABLET | Freq: Three times a day (TID) | ORAL | 0 refills | Status: AC

## 2024-06-10 NOTE — Progress Notes (Signed)
 Mobility Specialist Progress Note:    06/10/24 0931  Mobility  Activity Ambulated with assistance in hallway  Level of Assistance Standby assist, set-up cues, supervision of patient - no hands on  Assistive Device Cane  Distance Ambulated (ft) 120 ft  Range of Motion/Exercises Active;All extremities  Activity Response Tolerated well  Mobility Referral Yes  Mobility visit 1 Mobility  Mobility Specialist Start Time (ACUTE ONLY) [id number]  Mobility Specialist Stop Time (ACUTE ONLY) 0951  Mobility Specialist Time Calculation (min) (ACUTE ONLY) 20 min   Pt received in bed, agreeable to mobility. Required supervision to stand and ambulate with straight cane. Tolerated well,asx throughout. Returned pt supine, all needs met.  Ysela Hettinger Mobility Specialist Please contact via Special educational needs teacher or  Rehab office at [phone]

## 2024-06-10 NOTE — Discharge Summary (Signed)
 Physician Discharge Summary   Patient: [name] MRN: [id number] DOB: 04-17-1946  Admit date:     06/07/2024  Discharge date: 06/10/24  Discharge Physician: Adriana DELENA Grams   PCP: Rosamond Leta NOVAK, MD   Recommendations at discharge:   Follow-up with urologist Dr. Sherrilee in 1-2 weeks With PCP in 1-2 weeks Holding spironolactone  due to soft BP and AKI  Continue antibiotics per Dr. Sherrilee, resume Eliquis   Discharge Diagnoses: Principal Problem:   Nephrolithiasis Active Problems:   Hypertension   High cholesterol   GERD (gastroesophageal reflux disease)   Hypothyroidism  Resolved Problems:   * No resolved hospital problems. *  Hospital Course: [name] is a 78 y.o. year old female with past medical history of hypertension, hyperlipidemia, atrial fibrillation, GERD, and hypothyroidism.  He presents to [name] ED reports of left-sided flank pain hematuria that has been ongoing for about 3 weeks.  She was seen in [facility] ED on 06/01/24 for the same.  She has taken Cipro with no improvement in symptoms, then completed a course of Macrobid, and finally completed a 1 week course of Levaquin with last dose being yesterday.  She continues to have left flank pain and hematuria after completion of the antibiotics.  She endorses nausea, vomiting, and a mild burning sensation with urination.  Denies abdominal pain.   ED Course: afebrile temp 36.8 C, BP 156/65, HR 78, RR 18, SPO2 90% on room air.   CXR obtained and negative for any active cardiopulmonary disease.   CT renal stone study obtained and shows 0.3 cm obstructing left ureteral stone located in the distal ureter near the UVJ and associated left hydronephrosis, prior L1 vertebral body compression fracture seen again.  Labs notable for UA without signs of infection, creatinine 1.32, alkaline phosphatase 146 with normal AST/ALT, and WBC 11.4.  She was given Diflucan , Norco, and 1 L normal saline bolus. [facility] contacted for  admission.      Nephrolithiasis -Hemodynamically stable -Still having left flank pain improved with IV analgesics -Prolonged pain and discomfort  - Urologist Dr. Sherrilee following- 06/09/2024 cystoscopy 2. Left retrograde pyelography 3.  Intraoperative fluoroscopy, under one hour, with interpretation 4.  Left ureteroscopic stone manipulation with basket extraction 5.  Left 6 x 26 JJ stent placement  -Continuing Flomax , as needed analgesics, antiemetics  CT renal stone with 0.3 cm obstructing left ureteral stone in the distal ureter near the UVJ with left hydronephrosis.   -Patient has completed recent outpatient courses of Cipro, Macrobid, and Levaquin. - UA clean still complaining of dysuria continuing IV ABX   Hematuria -H&H mild drop otherwise stable - Recheck H&H this evening-remained stable - Hold home Eliquis -to be resumed 06/10/2024 - Treatment as above  Acute kidney injury - Monitoring-improving Lab Results  Component Value Date   CREATININE 0.95 06/10/2024   CREATININE 1.14 (H) 06/08/2024   CREATININE 1.32 (H) 06/07/2024     - Continue maintenance IV fluid - Avoid nephrotoxins, contrast Dyes, Hypotension and Dehydration  - Continue to Monitor and Trend Renal Function carefully and repeat panel in the AM  Dysuria -UA negative for nitrites, or leukocyte esterase, 5 ketones, hemoglobin, urine WBC >  50 - Complaining of pain and burning with urination, - Will follow the urine culture no growth to date - was on  IV antibiotics of Rocephin -switch to p.o. Ceftin per urology recommendation added as needed Pyridium   Chronic medical issues:  Hypertension BP currently soft, - Spironolactone , continue metoprolol   Hyperlipidemia  - Continue home Crestor    Atrial fibrillation - Continue home diltiazem  - Resume home Eliquis  was on hold   Hypothyroidism -  Continue home Synthroid    GERD-  Protonix      Consultants: Urologist Dr. Sherrilee Procedures  performed: Cystoscopy, lithotripsy, stent placement Disposition: Home Diet recommendation:  Discharge Diet Orders (From admission, onward)     Start     Ordered   06/10/24 0000  Diet - low sodium heart healthy        06/10/24 0758           Cardiac diet DISCHARGE MEDICATION: Allergies as of 06/10/2024       Reactions   Other Nausea And Vomiting   Opiate based pain medicines    Latex Rash   Lexiscan  [regadenoson ] Nausea Only, Other (See Comments)   See 12/14/2023 Lexiscan  report for details-hypotension and nausea   Penicillins Rash   Sulfa Antibiotics Rash   Tramadol Itching        Medication List     PAUSE taking these medications    spironolactone  25 MG tablet Wait to take this until: June 13, 2024 Commonly known as: ALDACTONE  TAKE 1 TABLET BY MOUTH  DAILY       STOP taking these medications    Probiotic Digestive Support Caps       TAKE these medications    ALPRAZolam  0.25 MG tablet Commonly known as: XANAX  Take 0.25 mg by mouth at bedtime as needed for sleep.   apixaban  5 MG Tabs tablet Commonly known as: ELIQUIS  Take 1 tablet (5 mg total) by mouth 2 (two) times daily.   cefUROXime 500 MG tablet Commonly known as: CEFTIN Take 1 tablet (500 mg total) by mouth 2 (two) times daily with a meal for 5 days.   citalopram  20 MG tablet Commonly known as: CELEXA  Take 20 mg by mouth daily.   diltiazem  240 MG 24 hr capsule Commonly known as: CARDIZEM  CD Take 1 capsule (240 mg total) by mouth daily.   HYDROcodone -acetaminophen  5-325 MG tablet Commonly known as: NORCO/VICODIN Take 1 tablet by mouth every 6 (six) hours as needed for up to 5 days for moderate pain (pain score 4-6) or severe pain (pain score 7-10).   lactobacillus acidophilus & bulgar chewable tablet Chew 1 tablet by mouth 3 (three) times daily with meals for 5 days.   levothyroxine  75 MCG tablet Commonly known as: SYNTHROID  Take 75 mcg by mouth daily before breakfast.   MAGNESIUM   PO Take 1 tablet by mouth daily. Taking 1 chewable by mouth daily   metoprolol  tartrate 25 MG tablet Commonly known as: LOPRESSOR  TAKE 1 TABLET BY MOUTH EVERY 8  HOURS AS NEEDED FOR PALPITATIONS What changed: See the new instructions.   multivitamin with minerals tablet Take 1 tablet by mouth daily. One A Day for Women   ondansetron  4 MG tablet Commonly known as: Zofran  Take 1 tablet (4 mg total) by mouth daily as needed for nausea or vomiting. What changed:  when to take this reasons to take this   Pataday  0.1 % ophthalmic solution Generic drug: olopatadine  Place 1 drop into both eyes daily.   phenazopyridine 100 MG tablet Commonly known as: PYRIDIUM Take 1 tablet (100 mg total) by mouth 3 (three) times daily as needed for pain.   polyethylene glycol 17 g packet Commonly known as: MIRALAX  / GLYCOLAX  Take 17 g by mouth daily as needed for moderate constipation.   rosuvastatin  5 MG tablet Commonly known as: CRESTOR  TAKE  1 TABLET BY MOUTH DAILY   tamsulosin  0.4 MG Caps capsule Commonly known as: FLOMAX  Take 1 capsule (0.4 mg total) by mouth daily.   Vitamin D3 50 MCG (2000 UT) capsule Take 4,000 Units by mouth daily.        Follow-up Information     McKenzie, Belvie CROME, MD. Call in 2 week(s).   Specialty: Urology Contact information: [address]  [place] [address] [phone]                Discharge Exam: Fredricka Weights   06/07/24 1104 06/07/24 1635 06/09/24 1224  Weight: 75.3 kg 76.5 kg 76.5 kg        General:  AAO x 3,  cooperative, no distress;   HEENT:  Normocephalic, PERRL, otherwise with in Normal limits   Neuro:  CNII-XII intact. , normal motor and sensation, reflexes intact   Lungs:   Clear to auscultation BL, Respirations unlabored,  No wheezes / crackles  Cardio:    S1/S2, RRR, No murmure, No Rubs or Gallops   Abdomen:  Soft, non-tender, bowel sounds active all four quadrants, no guarding or peritoneal signs.   Muscular  skeletal:  Limited exam -global generalized weaknesses - in bed, able to move all 4 extremities,   2+ pulses,  symmetric, No pitting edema  Skin:  Dry, warm to touch, negative for any Rashes,  Wounds: Please see nursing documentation          Condition at discharge: good  The results of significant diagnostics from this hospitalization (including imaging, microbiology, ancillary and laboratory) are listed below for reference.   Imaging Studies: DG C-Arm 1-60 Min-No Report Result Date: 06/09/2024 Fluoroscopy was utilized by the requesting physician.  No radiographic interpretation.   DG Chest Portable 1 View Result Date: 06/07/2024 CLINICAL DATA:  chest pain EXAM: PORTABLE CHEST 1 VIEW COMPARISON:  Chest x-ray 10/21/2019 FINDINGS: The heart and mediastinal contours are unchanged. Atherosclerotic plaque. No focal consolidation. No pulmonary edema. No pleural effusion. No pneumothorax. No acute osseous abnormality. Bilateral reversed total shoulder arthroplasties. Cervical spine surgical hardware. Likely thyroid  vascular clips. IMPRESSION: 1. No active cardiopulmonary disease. 2.  Aortic Atherosclerosis (ICD10-I70.0). Electronically Signed   By: Morgane  Naveau M.D.   On: 06/07/2024 14:34   CT Renal Stone Study Result Date: 06/07/2024 CLINICAL DATA:  Recurrent left flank pain, hematuria, EXAM: CT ABDOMEN AND PELVIS WITHOUT CONTRAST TECHNIQUE: Multidetector CT imaging of the abdomen and pelvis was performed following the standard protocol without IV contrast. RADIATION DOSE REDUCTION: This exam was performed according to the departmental dose-optimization program which includes automated exposure control, adjustment of the mA and/or kV according to patient size and/or use of iterative reconstruction technique. COMPARISON:  June 01, 2024 FINDINGS: Lower chest: Unremarkable. Hepatobiliary: No suspicious liver lesion. No gallstones, gallbladder wall thickening, or biliary dilatation.  Pancreas: Unremarkable. Spleen: Unremarkable. Adrenals/Urinary Tract: Adrenal glands are unremarkable. Moderate left hydronephrosis with 0.3 cm obstructing ureteral stone now located in the left distal ureter near the UVJ. Fullness of the right kidney lower pole, possibly related to parapelvic cysts, similar to prior. No definite right-sided nephrolithiasis. Likely punctate nonobstructing left renal stones. Stomach/Bowel: No evidence of bowel obstruction or inflammation. Vascular/Lymphatic: Normal caliber aorta. No lymphadenopathy by size criteria. Reproductive: Status post hysterectomy. No adnexal masses. Other: No free air or free fluid. Musculoskeletal: Unchanged mild L1 vertebral body compression deformity. IMPRESSION: 1. Interval advancement of a 0.3 cm obstructing left ureteral stone, now located in the distal ureter near the  UVJ with persistent left hydronephrosis. 2.  Unchanged mild L1 vertebral body compression fracture. Electronically Signed   By: Michaeline Blanch M.D.   On: 06/07/2024 13:25   CT Renal Stone Study Result Date: 06/01/2024 CLINICAL DATA:  Recent diagnosis of UTI with hematuria and left flank pain. Nausea vomiting. EXAM: CT ABDOMEN AND PELVIS WITHOUT CONTRAST TECHNIQUE: Multidetector CT imaging of the abdomen and pelvis was performed following the standard protocol without IV contrast. RADIATION DOSE REDUCTION: This exam was performed according to the departmental dose-optimization program which includes automated exposure control, adjustment of the mA and/or kV according to patient size and/or use of iterative reconstruction technique. COMPARISON:  None Available. FINDINGS: Lower chest: No acute abnormality. Hepatobiliary: No focal liver abnormality is seen. No gallstones, gallbladder wall thickening, or biliary dilatation. Pancreas: Unremarkable. No pancreatic ductal dilatation or surrounding inflammatory changes. Spleen: Normal in size without focal abnormality. Adrenals/Urinary Tract: The  adrenal glands are within normal limits. A punctate calculus is noted in the left kidney. Parapelvic cysts are present bilaterally. Moderate there is hyper mild-to-moderate hydro nephrosis on the left with a 3 mm calculus in the proximal left ureter. The bladder is unremarkable. Stomach/Bowel: There is a small hiatal hernia distended with ingested debris. The stomach is otherwise within normal limits. No bowel obstruction, free air, or pneumatosis is seen. Scattered diverticula are present along the colon without evidence of diverticulitis. The appendix is not seen. Vascular/Lymphatic: Aortic atherosclerosis. No enlarged abdominal or pelvic lymph nodes. Reproductive: Status post hysterectomy. No adnexal masses. Other: No abdominopelvic ascites. There is a fat containing umbilical hernia and small fat containing inguinal hernias bilaterally. Musculoskeletal: Degenerative changes are present in the thoracolumbar spine. There is a mild compression deformity in the superior endplate at L1. IMPRESSION: 1. Mild to moderate obstructive uropathy on the left with a 3 mm calculus in the proximal left ureter. 2. Nonobstructive left renal calculus. 3. Small hiatal hernia containing ingested debris. 4. Diverticulosis without diverticulitis. 5. Mild compression deformity in the superior endplate of L1, indeterminate in age. 6. Aortic atherosclerosis. Electronically Signed   By: Leita Birmingham M.D.   On: 06/01/2024 18:30    Microbiology: Results for orders placed or performed during the hospital encounter of 06/07/24  Urine Culture     Status: None   Collection Time: 06/07/24  2:10 PM   Specimen: Urine, Clean Catch  Result Value Ref Range Status   Specimen Description   Final    URINE, CLEAN CATCH Performed at [facility], [address], [place], [place] [address]    Special Requests   Final    NONE Performed at [facility], [address], [place], [place] [address]    Culture   Final    NO GROWTH Performed at  [facility], [address], [place], [place] [address]    Report Status 06/08/2024 FINAL  Final    Labs: CBC: Recent Labs  Lab 06/07/24 1140 06/07/24 2130 06/08/24 0425 06/10/24 0442  WBC 11.4*  --  13.6* 11.8*  NEUTROABS 7.7  --   --   --   HGB 14.1 12.6 13.6 12.6  HCT 41.8 37.6 40.7 37.1  MCV 94.4  --  94.4 93.5  PLT 268  --  264 299   Basic Metabolic Panel: Recent Labs  Lab 06/07/24 1140 06/08/24 0425 06/10/24 0442  NA 137 135 135  K 3.9 4.1 3.9  CL 96* 101 100  CO2 26 26 27   GLUCOSE 107* 122* 137*  BUN  11 10 18   CREATININE 1.32* 1.14* 0.95  CALCIUM  9.7 9.2 9.2  MG  --  2.0  --    Liver Function Tests: Recent Labs  Lab 06/07/24 1140 06/10/24 0442  AST 31 22  ALT 23 18  ALKPHOS 146* 148*  BILITOT 1.1 0.7  PROT 7.0 6.1*  ALBUMIN 3.4* 2.4*   CBG: No results for input(s): GLUCAP in the last 168 hours.  Discharge time spent: greater than 30 minutes.  Signed: Adriana DELENA Grams, MD [facility] 06/10/2024

## 2024-06-10 NOTE — Care Management Important Message (Signed)
 Important Message  Patient Details  Name: [name] MRN: [id number] Date of Birth: 02/11/1946   Important Message Given:  N/A - LOS <3 / Initial given by admissions     [name] Eleena Grater 06/10/2024, 9:53 AM

## 2024-06-10 NOTE — Progress Notes (Signed)
 Nsg Discharge Note  Admit Date:  06/07/2024 Discharge date: 06/10/2024   [facility] JINNY Farr to be D/C'd Home per MD order.  AVS completed.  Copy for chart, and copy for patient signed, and dated. Patient/caregiver able to verbalize understanding.  Discharge Medication: Allergies as of 06/10/2024       Reactions   Other Nausea And Vomiting   Opiate based pain medicines    Latex Rash   Lexiscan  [regadenoson ] Nausea Only, Other (See Comments)   See 12/14/2023 Lexiscan  report for details-hypotension and nausea   Penicillins Rash   Sulfa Antibiotics Rash   Tramadol Itching        Medication List     PAUSE taking these medications    spironolactone  25 MG tablet Wait to take this until: June 13, 2024 Commonly known as: ALDACTONE  TAKE 1 TABLET BY MOUTH  DAILY       STOP taking these medications    Probiotic Digestive Support Caps       TAKE these medications    ALPRAZolam  0.25 MG tablet Commonly known as: XANAX  Take 0.25 mg by mouth at bedtime as needed for sleep.   apixaban  5 MG Tabs tablet Commonly known as: ELIQUIS  Take 1 tablet (5 mg total) by mouth 2 (two) times daily.   cefUROXime 500 MG tablet Commonly known as: CEFTIN Take 1 tablet (500 mg total) by mouth 2 (two) times daily with a meal for 5 days.   citalopram  20 MG tablet Commonly known as: CELEXA  Take 20 mg by mouth daily.   diltiazem  240 MG 24 hr capsule Commonly known as: CARDIZEM  CD Take 1 capsule (240 mg total) by mouth daily.   HYDROcodone -acetaminophen  5-325 MG tablet Commonly known as: NORCO/VICODIN Take 1 tablet by mouth every 6 (six) hours as needed for up to 5 days for moderate pain (pain score 4-6) or severe pain (pain score 7-10).   lactobacillus acidophilus & bulgar chewable tablet Chew 1 tablet by mouth 3 (three) times daily with meals for 5 days.   levothyroxine  75 MCG tablet Commonly known as: SYNTHROID  Take 75 mcg by mouth daily before breakfast.   MAGNESIUM  PO Take 1 tablet  by mouth daily. Taking 1 chewable by mouth daily   metoprolol  tartrate 25 MG tablet Commonly known as: LOPRESSOR  TAKE 1 TABLET BY MOUTH EVERY 8  HOURS AS NEEDED FOR PALPITATIONS What changed: See the new instructions.   multivitamin with minerals tablet Take 1 tablet by mouth daily. One A Day for Women   ondansetron  4 MG tablet Commonly known as: Zofran  Take 1 tablet (4 mg total) by mouth daily as needed for nausea or vomiting. What changed:  when to take this reasons to take this   Pataday  0.1 % ophthalmic solution Generic drug: olopatadine  Place 1 drop into both eyes daily.   phenazopyridine 100 MG tablet Commonly known as: PYRIDIUM Take 1 tablet (100 mg total) by mouth 3 (three) times daily as needed for pain.   polyethylene glycol 17 g packet Commonly known as: MIRALAX  / GLYCOLAX  Take 17 g by mouth daily as needed for moderate constipation.   rosuvastatin  5 MG tablet Commonly known as: CRESTOR  TAKE 1 TABLET BY MOUTH DAILY   tamsulosin  0.4 MG Caps capsule Commonly known as: FLOMAX  Take 1 capsule (0.4 mg total) by mouth daily.   Vitamin D3 50 MCG (2000 UT) capsule Take 4,000 Units by mouth daily.        Discharge Assessment: Vitals:   06/10/24 0446 06/10/24 0909  BP: ROLLEN)  128/58 (!) 107/49  Pulse: 71 71  Resp: 18   Temp: 97.8 F (36.6 C)   SpO2: 92%    Skin clean, dry and intact without evidence of skin break down, no evidence of skin tears noted. IV catheter discontinued intact. Site without signs and symptoms of complications - no redness or edema noted at insertion site, patient denies c/o pain - only slight tenderness at site.  Dressing with slight pressure applied.  D/c Instructions-Education: Discharge instructions given to patient/family with verbalized understanding. D/c education completed with patient/family including follow up instructions, medication list, d/c activities limitations if indicated, with other d/c instructions as indicated by MD -  patient able to verbalize understanding, all questions fully answered. Patient instructed to return to ED, call 911, or call MD for any changes in condition.  Patient escorted via WC, and D/C home via private auto.  [place] JULIANNA Casey, RN 06/10/2024 10:18 AM

## 2024-06-10 NOTE — Plan of Care (Signed)

## 2024-06-13 ENCOUNTER — Telehealth: Payer: Self-pay | Admitting: Urology

## 2024-06-13 NOTE — Telephone Encounter (Signed)
 Caller had procedure and has blood in urine. Also wants to know when she can start Eliquis .

## 2024-06-13 NOTE — Telephone Encounter (Signed)
 Wants to know if she needs to come to appt tomorrow or when the stent will be removed.

## 2024-06-13 NOTE — Telephone Encounter (Signed)
 Please notify patient she does not need to keep tomorrow's appt.  She is scheduled with Dr. Sherrilee on 05/25 at 0830 to have her stent removed please notify patient.

## 2024-06-14 ENCOUNTER — Ambulatory Visit: Admitting: Urology

## 2024-06-17 ENCOUNTER — Encounter: Payer: Self-pay | Admitting: Urology

## 2024-06-17 ENCOUNTER — Ambulatory Visit: Admitting: Urology

## 2024-06-17 VITALS — BP 129/75 | HR 84

## 2024-06-17 DIAGNOSIS — N200 Calculus of kidney: Secondary | ICD-10-CM

## 2024-06-17 DIAGNOSIS — Z466 Encounter for fitting and adjustment of urinary device: Secondary | ICD-10-CM | POA: Diagnosis not present

## 2024-06-17 LAB — URINALYSIS, ROUTINE W REFLEX MICROSCOPIC
Bilirubin, UA: NEGATIVE
Glucose, UA: NEGATIVE
Nitrite, UA: NEGATIVE
Specific Gravity, UA: 1.03 (ref 1.005–1.030)
Urobilinogen, Ur: 1 mg/dL (ref 0.2–1.0)
pH, UA: 6 (ref 5.0–7.5)

## 2024-06-17 LAB — MICROSCOPIC EXAMINATION: RBC, Urine: >30 /HPF — AB (ref 0–2)

## 2024-06-17 MED ORDER — CIPROFLOXACIN HCL 500 MG PO TABS: 500.0000 mg | ORAL_TABLET | Freq: Once | ORAL | Status: AC

## 2024-06-17 NOTE — Progress Notes (Signed)
   06/17/24  CC: followup nephrolithiasis   HPI: [name] is a 78yo here for left stent removal Blood pressure 129/75, pulse 84. NED. A&Ox3.   No respiratory distress   Abd soft, NT, ND Normal external genitalia with patent urethral meatus  Cystoscopy Procedure Note  Patient identification was confirmed, informed consent was obtained, and patient was prepped using Betadine  solution.  Lidocaine  jelly was administered per urethral meatus.    Procedure: - Flexible cystoscope introduced, without any difficulty.   - Thorough search of the bladder revealed:    normal urethral meatus    normal urothelium    no stones    no ulcers     no tumors    no urethral polyps    no trabeculation  - Ureteral orifices were normal in position and appearance. -using a grasper the left ureteral stent was removed intact  Post-Procedure: - Patient tolerated the procedure well  Assessment/ Plan: Followup 3 months with a renal US    No follow-ups on file.  Belvie Clara, MD

## 2024-06-17 NOTE — Patient Instructions (Signed)

## 2024-06-18 LAB — STONE ANALYSIS
Calcium Oxalate Dihydrate: 80 %
Calcium Oxalate Monohydrate: 20 %
Weight Calculi: 7 mg

## 2024-06-21 DIAGNOSIS — I10 Essential (primary) hypertension: Secondary | ICD-10-CM | POA: Diagnosis not present

## 2024-06-21 DIAGNOSIS — Z09 Encounter for follow-up examination after completed treatment for conditions other than malignant neoplasm: Secondary | ICD-10-CM | POA: Diagnosis not present

## 2024-06-21 DIAGNOSIS — N201 Calculus of ureter: Secondary | ICD-10-CM | POA: Diagnosis not present

## 2024-06-23 ENCOUNTER — Encounter: Payer: Self-pay | Admitting: Gastroenterology

## 2024-06-23 ENCOUNTER — Ambulatory Visit: Admitting: Gastroenterology

## 2024-06-23 VITALS — BP 133/75 | HR 73 | Temp 98.5°F | Ht 62.0 in | Wt 163.2 lb

## 2024-06-23 DIAGNOSIS — K5900 Constipation, unspecified: Secondary | ICD-10-CM | POA: Diagnosis not present

## 2024-06-23 DIAGNOSIS — Z860101 Personal history of adenomatous and serrated colon polyps: Secondary | ICD-10-CM

## 2024-06-23 DIAGNOSIS — R748 Abnormal levels of other serum enzymes: Secondary | ICD-10-CM | POA: Diagnosis not present

## 2024-06-23 NOTE — Patient Instructions (Signed)
 You can complete the blood work at [facility] in the near future.  Just make sure you are fasting for this.  Please call us  if you have any bright red blood in your stool, any black black, tarry stool, any belly pain, any nausea or vomiting.  You can take the MiraLAX  as needed just like you are doing.  Further recommendations to follow after the blood work is done!  I enjoyed seeing you again today! I value our relationship and want to provide genuine, compassionate, and quality care. You may receive a survey regarding your visit with me, and I welcome your feedback! Thanks so much for taking the time to complete this. I look forward to seeing you again.      Therisa MICAEL Stager, PhD, ANP-BC [facility]

## 2024-06-23 NOTE — Progress Notes (Addendum)
 Gastroenterology Office Note     Primary Care Physician:  Rosamond Leta NOVAK, MD  Primary Gastroenterologist: Dr. Eartha    Chief Complaint   Chief Complaint  Patient presents with   Follow-up    Pt here for follow up. Wants to just touch base     History of Present Illness   [name] is a 78 y.o. female presenting today with a history of chronic constipation, history of anal fissure, internal hemorrhoids, last seen in February 2024.  She is returning today just for routine follow-up. Since last seen, she has had kidney stones with a procedure, cardiac ablation in May 2025, knee replacement in Dec 2024. She wanted to touch base with us  to update about her history.  Historically, her constipation has been managed with fiber and stool softeners.  She wanted to avoid prescriptive agents in the past.  When she was last seen we added MiraLAX  to her regimen to take daily as needed.  She has also had rectal bleeding due to hemorrhoids in the past, but we have held off on banding as she is on anticoagulation unless she had significant bleeding despite changing bowel regimen and avoidance of straining and constipation.   Constipation: bristol stool #2-4. Drinking plenty of water . Not taking supplemental fiber. Eats high fiber diet. No straining. Will occasionally take a stool softener. If she misses a day, she will take Miralax . No rectal bleeding. Wants to hold off on any colonoscopy.  She feels overall that she is doing well without issues with constipation.  No GERD symptoms.  She has no problems swallowing.  No abdominal pain.  No nausea or vomiting.  While in the hospital recently due to kidney stones, she noted that her hemoglobin had dropped down from its normal.  Typically she has had an elevated hemoglobin in the 15 and 16 range.  She has declined evaluation for this and has declined evaluation for this again today.  However while in the hospital, her hemoglobin drifted down to  12.6.  Notably, at that time, she was having hematuria.   Colonoscopy 2020: tubular adenoma, diverticulosis   No FH colon cancer.    Past Medical History:  Diagnosis Date   Anxiety    Arthritis    Atrial fibrillation [facility])    Documented December 2019   Complication of anesthesia    slow to wake up   Depression    GERD (gastroesophageal reflux disease)    Heart murmur    Hyperlipidemia    Hypertension    Hypothyroidism    PONV (postoperative nausea and vomiting)     Past Surgical History:  Procedure Laterality Date   APPENDECTOMY     ATRIAL FIBRILLATION ABLATION N/A 04/05/2024   Procedure: ATRIAL FIBRILLATION ABLATION;  Surgeon: Nancey Eulas BRAVO, MD;  Location: [facility];  Service: Cardiovascular;  Laterality: N/A;   BUNIONECTOMY Bilateral    CATARACT EXTRACTION W/PHACO Right 10/09/2017   Procedure: CATARACT EXTRACTION PHACO AND INTRAOCULAR LENS PLACEMENT (IOC);  Surgeon: Harrie Agent, MD;  Location: [facility];  Service: Ophthalmology;  Laterality: Right;  CDE: 6.65   CATARACT EXTRACTION W/PHACO Left 12/11/2017   Procedure: CATARACT EXTRACTION PHACO AND INTRAOCULAR LENS PLACEMENT (IOC);  Surgeon: Harrie Agent, MD;  Location: [facility];  Service: Ophthalmology;  Laterality: Left;  CDE: 5.28   COLONOSCOPY  06/18/2012   Procedure: COLONOSCOPY;  Surgeon: Claudis RAYMOND Rivet, MD;  Location: [facility];  Service: Endoscopy;  Laterality: N/A;  730  COLONOSCOPY N/A 07/04/2019   Rehman: one 7 mm polyp in proximal ascending colon, removed, clips placed to treat postpolypectomy bleed (tubular adenoma), diverticulosis in sigmoid colon, hepatic flexure, external hemorrhoids   CYSTOSCOPY WITH RETROGRADE PYELOGRAM, URETEROSCOPY AND STENT PLACEMENT Left 06/09/2024   Procedure: CYSTOURETEROSCOPY, WITH RETROGRADE PYELOGRAM AND STENT INSERTION;  Surgeon: Sherrilee Belvie CROME, MD;  Location: [facility];  Service: Urology;  Laterality: Left;   CYSTOSCOPY/RETROGRADE/URETEROSCOPY/STONE EXTRACTION  WITH BASKET  06/09/2024   Procedure: CYSTOSCOPY, WITH CALCULUS REMOVAL USING BASKET;  Surgeon: Sherrilee Belvie CROME, MD;  Location: [facility];  Service: Urology;;   ESOPHAGOGASTRODUODENOSCOPY     Hammer toe repair Left    HEMORRHOID SURGERY     KNEE ARTHROSCOPY     left 06-2019   REVERSE SHOULDER ARTHROPLASTY Right 07/12/2021   Procedure: REVERSE SHOULDER ARTHROPLASTY;  Surgeon: Kay Kemps, MD;  Location: [facility];  Service: Orthopedics;  Laterality: Right;  with ISB   SHOULDER ARTHROSCOPY     right   THYROIDECTOMY     TONSILLECTOMY     TOTAL ABDOMINAL HYSTERECTOMY     TOTAL KNEE ARTHROPLASTY Right 06/29/2020   Procedure: TOTAL KNEE ARTHROPLASTY;  Surgeon: Kay Kemps, MD;  Location: [facility];  Service: Orthopedics;  Laterality: Right;  adductor canal   TOTAL KNEE ARTHROPLASTY Left 11/13/2023   Procedure: TOTAL KNEE ARTHROPLASTY;  Surgeon: Kay Kemps, MD;  Location: [facility];  Service: Orthopedics;  Laterality: Left;  general anesthesia 120 flip room   TOTAL SHOULDER ARTHROPLASTY Left 02/06/2017   TOTAL SHOULDER ARTHROPLASTY Left 02/06/2017   Procedure: TOTAL SHOULDER ARTHROPLASTY;  Surgeon: Kemps Kay, MD;  Location: [facility] OR;  Service: Orthopedics;  Laterality: Left;    Current Outpatient Medications  Medication Sig Dispense Refill   ALPRAZolam  (XANAX ) 0.25 MG tablet Take 0.25 mg by mouth at bedtime as needed for sleep.     apixaban  (ELIQUIS ) 5 MG TABS tablet Take 1 tablet (5 mg total) by mouth 2 (two) times daily. 28 tablet 0   Cholecalciferol  (VITAMIN D3) 50 MCG (2000 UT) capsule Take 4,000 Units by mouth daily.     citalopram  (CELEXA ) 20 MG tablet Take 20 mg by mouth daily.     diltiazem  (CARDIZEM  CD) 240 MG 24 hr capsule Take 1 capsule (240 mg total) by mouth daily. 90 capsule 1   levothyroxine  (SYNTHROID , LEVOTHROID) 75 MCG tablet Take 75 mcg by mouth daily before breakfast.      MAGNESIUM  PO Take 1 tablet by mouth daily. Taking 1 chewable by mouth daily     Multiple  Vitamins-Minerals (MULTIVITAMIN WITH MINERALS) tablet Take 1 tablet by mouth daily. One A Day for Women     olopatadine  (PATADAY ) 0.1 % ophthalmic solution Place 1 drop into both eyes daily.     polyethylene glycol (MIRALAX  / GLYCOLAX ) 17 g packet Take 17 g by mouth daily as needed for moderate constipation.     rosuvastatin  (CRESTOR ) 5 MG tablet TAKE 1 TABLET BY MOUTH DAILY 90 tablet 3   spironolactone  (ALDACTONE ) 25 MG tablet TAKE 1 TABLET BY MOUTH  DAILY 90 tablet 1   metoprolol  tartrate (LOPRESSOR ) 25 MG tablet TAKE 1 TABLET BY MOUTH EVERY 8  HOURS AS NEEDED FOR PALPITATIONS (Patient taking differently: Take 25 mg by mouth every 8 (eight) hours as needed (A-fib palpitations).) 100 tablet 2   ondansetron  (ZOFRAN ) 4 MG tablet Take 1 tablet (4 mg total) by mouth daily as needed for nausea or vomiting. 30 tablet 1   phenazopyridine  (PYRIDIUM ) 100 MG tablet Take 1  tablet (100 mg total) by mouth 3 (three) times daily as needed for pain. (Patient not taking: Reported on 06/17/2024) 20 tablet 0   tamsulosin  (FLOMAX ) 0.4 MG CAPS capsule Take 1 capsule (0.4 mg total) by mouth daily. (Patient not taking: Reported on 06/17/2024) 15 capsule 0   Current Facility-Administered Medications  Medication Dose Route Frequency Provider Last Rate Last Admin   ciprofloxacin  (CIPRO ) tablet 500 mg  500 mg Oral Once Sherrilee Belvie CROME, MD        Allergies as of 06/23/2024 - Review Complete 06/23/2024  Allergen Reaction Noted   Other Nausea And Vomiting 07/02/2021   Latex Rash 01/27/2017   Lexiscan  [regadenoson ] Nausea Only and Other (See Comments) 12/15/2023   Penicillins Rash 06/15/2012   Sulfa antibiotics Rash 06/15/2012   Tramadol Itching 10/05/2018    Family History  Problem Relation Age of Onset   Atrial fibrillation Sister    Colon cancer Neg Hx    Breast cancer Neg Hx    Parkinson's disease Neg Hx    Tremor Neg Hx     Social History   Socioeconomic History   Marital status: Widowed    Spouse  name: Not on file   Number of children: Not on file   Years of education: Not on file   Highest education level: Not on file  Occupational History   Not on file  Tobacco Use   Smoking status: Never    Passive exposure: Never   Smokeless tobacco: Never  Vaping Use   Vaping status: Never Used  Substance and Sexual Activity   Alcohol  use: No    Alcohol /week: 0.0 standard drinks of alcohol    Drug use: No   Sexual activity: Not Currently    Birth control/protection: Surgical  Other Topics Concern   Not on file  Social History Narrative   Not on file   Social Drivers of Health   Financial Resource Strain: Not on file  Food Insecurity: No Food Insecurity (06/07/2024)   Hunger Vital Sign    Worried About Running Out of Food in the Last Year: Never true    Ran Out of Food in the Last Year: Never true  Transportation Needs: No Transportation Needs (06/07/2024)   PRAPARE - Administrator, Civil Service (Medical): No    Lack of Transportation (Non-Medical): No  Physical Activity: Not on file  Stress: Not on file  Social Connections: Moderately Integrated (06/07/2024)   Social Connection and Isolation Panel    Frequency of Communication with Friends and Family: More than three times a week    Frequency of Social Gatherings with Friends and Family: More than three times a week    Attends Religious Services: More than 4 times per year    Active Member of [employer] or Organizations: Yes    Attends Banker Meetings: More than 4 times per year    Marital Status: Widowed  Intimate Partner Violence: Not At Risk (06/07/2024)   Humiliation, Afraid, Rape, and Kick questionnaire    Fear of Current or Ex-Partner: No    Emotionally Abused: No    Physically Abused: No    Sexually Abused: No     Review of Systems   Gen: Denies any fever, chills, fatigue, weight loss, lack of appetite.  CV: Denies chest pain, heart palpitations, peripheral edema, syncope.  Resp: Denies  shortness of breath at rest or with exertion. Denies wheezing or cough.  GI: Denies dysphagia or odynophagia. Denies jaundice, hematemesis, fecal incontinence.  GU : Denies urinary burning, urinary frequency, urinary hesitancy MS: Denies joint pain, muscle weakness, cramps, or limitation of movement.  Derm: Denies rash, itching, dry skin Psych: Denies depression, anxiety, memory loss, and confusion Heme: Denies bruising, bleeding, and enlarged lymph nodes.   Physical Exam   BP 133/75   Pulse 73   Temp 98.5 F (36.9 C)   Ht 5' 2 (1.575 m)   Wt 163 lb 3.2 oz (74 kg)   BMI 29.85 kg/m  General:   Alert and oriented. Pleasant and cooperative. Well-nourished and well-developed.  Head:  Normocephalic and atraumatic. Eyes:  Without icterus Abdomen:  +BS, soft, non-tender and non-distended. No HSM noted. No guarding or rebound. No masses appreciated.  Rectal:  Deferred  Msk:  Symmetrical without gross deformities. Normal posture. Extremities:  Without edema. Neurologic:  Alert and  oriented x4;  grossly normal neurologically. Skin:  Intact without significant lesions or rashes. Psych:  Alert and cooperative. Normal mood and affect.   Assessment   KHARIZMA LESNICK is a 78 y.o. female presenting today with a history of chronic constipation, history of anal fissure, internal hemorrhoids, last seen in February 2024.  She is returning today just for routine follow-up.  Constipation is doing well with high-fiber diet and taking MiraLAX  and stool softeners just as needed.  She does have a history of adenomas in the past, but she wants to hold off on any surveillance colonoscopy right now.  She has no concerning lower GI signs or symptoms.  She has no concerning upper GI signs or symptoms.  Chronically elevated hemoglobin noted, and she has declined evaluation with hematology in the past and currently declines this now.  I do note that her hemoglobin drifted recently while she was inpatient but  this was in the setting of hematuria.  Her hemoglobin went from around the 15 range to about the 1213 range.  We will recheck this now.  Isolated elevated alk phos noted while inpatient.  I suspect this is not liver related.  We will just recheck HFP right now and do further evaluation if needed.    PLAN    CBC and HFP fasting Continue high-fiber diet, MiraLAX , and stool softeners as needed She can return here as needed if labs are unrevealing.  She is wanting to hold off on colonoscopy and will call us  if she has any changes clinically.   Deborah MICAEL Stager, PhD, ANP-BC [facility]   I have reviewed the note and agree with the APP's assessment as described in this progress note  Toribio Fortune, MD Gastroenterology and Hepatology [facility]

## 2024-06-29 DIAGNOSIS — K5900 Constipation, unspecified: Secondary | ICD-10-CM | POA: Diagnosis not present

## 2024-06-29 DIAGNOSIS — R748 Abnormal levels of other serum enzymes: Secondary | ICD-10-CM | POA: Diagnosis not present

## 2024-06-30 LAB — CBC WITH DIFFERENTIAL/PLATELET
Basophils Absolute: 0 x10E3/uL (ref 0.0–0.2)
Basos: 1 %
EOS (ABSOLUTE): 0.1 x10E3/uL (ref 0.0–0.4)
Eos: 1 %
Hematocrit: 49.1 % — ABNORMAL HIGH (ref 34.0–46.6)
Hemoglobin: 15.7 g/dL (ref 11.1–15.9)
Immature Grans (Abs): 0 x10E3/uL (ref 0.0–0.1)
Immature Granulocytes: 0 %
Lymphocytes Absolute: 1.6 x10E3/uL (ref 0.7–3.1)
Lymphs: 28 %
MCH: 30.7 pg (ref 26.6–33.0)
MCHC: 32 g/dL (ref 31.5–35.7)
MCV: 96 fL (ref 79–97)
Monocytes Absolute: 0.6 x10E3/uL (ref 0.1–0.9)
Monocytes: 11 %
Neutrophils Absolute: 3.3 x10E3/uL (ref 1.4–7.0)
Neutrophils: 59 %
Platelets: 294 x10E3/uL (ref 150–450)
RBC: 5.12 x10E6/uL (ref 3.77–5.28)
RDW: 12.3 % (ref 11.7–15.4)
WBC: 5.6 x10E3/uL (ref 3.4–10.8)

## 2024-06-30 LAB — HEPATIC FUNCTION PANEL
ALT: 14 IU/L (ref 0–32)
AST: 19 IU/L (ref 0–40)
Albumin: 4.3 g/dL (ref 3.8–4.8)
Alkaline Phosphatase: 117 IU/L (ref 44–121)
Bilirubin Total: 1.1 mg/dL (ref 0.0–1.2)
Bilirubin, Direct: 0.31 mg/dL (ref 0.00–0.40)
Total Protein: 7.1 g/dL (ref 6.0–8.5)

## 2024-07-10 ENCOUNTER — Ambulatory Visit: Payer: Self-pay | Admitting: Gastroenterology

## 2024-07-14 DIAGNOSIS — E1165 Type 2 diabetes mellitus with hyperglycemia: Secondary | ICD-10-CM | POA: Diagnosis not present

## 2024-07-29 ENCOUNTER — Encounter: Payer: Self-pay | Admitting: Cardiovascular Disease

## 2024-07-29 ENCOUNTER — Ambulatory Visit: Attending: Cardiovascular Disease | Admitting: Cardiovascular Disease

## 2024-07-29 VITALS — BP 124/82 | HR 56 | Ht 62.0 in | Wt 169.6 lb

## 2024-07-29 DIAGNOSIS — I4891 Unspecified atrial fibrillation: Secondary | ICD-10-CM

## 2024-07-29 DIAGNOSIS — I10 Essential (primary) hypertension: Secondary | ICD-10-CM | POA: Diagnosis not present

## 2024-07-29 MED ORDER — DILTIAZEM HCL ER COATED BEADS 120 MG PO CP24: 120.0000 mg | ORAL_CAPSULE | Freq: Every day | ORAL | 3 refills | Status: AC

## 2024-07-29 NOTE — Progress Notes (Signed)
  Electrophysiology Office Note:    Date:  07/29/2024   ID:  [name], DOB 30-Oct-1946, MRN [id number]  PCP:  Rosamond Leta NOVAK, MD   [facility] Providers Cardiologist:  Jayson Sierras, MD Electrophysiologist:  Eulas FORBES Furbish, MD     Referring MD: Rosamond Leta NOVAK, MD   History of Present Illness:    [name] is a 78 y.o. female with a medical history significant for paroxysmal atrial fibrillation, hypothyroid, referred for atrial fibrillation.      I discussed the use of AI scribe software for clinical note transcription with the patient, who gave verbal consent to proceed.  She has a history of atrial fibrillation, first diagnosed in December 2019, confirmed by an EKG. Initially, she was hospitalized and started on Eliquis . Despite initial control, she began experiencing frequent episodes, leading to the addition of flecainide  to her regimen.  Since starting flecainide , the frequency of her atrial fibrillation episodes has decreased, but she still experiences episodes, including one where her heart rate reached 114 bpm. She notes higher blood pressure, facial flushing, and headaches, raising concerns about the effectiveness of flecainide .  She describes a sensation of chest tightness at night when lying down, accompanied by discomfort, though she is uncertain if this is related to her atrial fibrillation.  She underwent an uncomplicated ablation for atrial fibrillation with PFA on Apr 05, 2024.     Today, she reports that she feels well.  She has no complaints.  Palpitations have improved on flecainide  but still recur.  EKGs/Labs/Other Studies Reviewed Today:     Echocardiogram:  TTE January 2025 EF 60 to 65%.  Mild LVH.  Grade 1 diastolic dysfunction.   Monitors:  14 day monitor April 2024-- my interpretation Sinus rhythm.  No atrial fibrillation reported  Stress testing:  Nuclear stress test January 2025 No evidence of ischemia   EKG:          Physical Exam:    VS:  BP 124/82 (BP Location: Left Arm)   Pulse (!) 56   Ht 5' 2 (1.575 m)   Wt 169 lb 9.6 oz (76.9 kg)   SpO2 96%   BMI 31.02 kg/m     Wt Readings from Last 3 Encounters:  07/29/24 169 lb 9.6 oz (76.9 kg)  06/23/24 163 lb 3.2 oz (74 kg)  06/09/24 168 lb 11.2 oz (76.5 kg)     GEN: Well nourished, well developed in no acute distress CARDIAC: RRR, no murmurs, rubs, gallops RESPIRATORY:  Normal work of breathing MUSCULOSKELETAL: no edema    ASSESSMENT & PLAN:     Paroxysmal atrial fibrillation Symptomatic with palpitations, fatigue She failed flecainide  Maintaining sinus rhythm status post ablation May 2025 Now off flecainide  Will reduce diltiazem  to 120 mg daily  Secondary hypercoagulable state Continue Eliquis  5 mg twice daily Labs June 10, 2024 reviewed     Signed, Eulas FORBES Furbish, MD  07/29/2024 3:04 PM    [facility]

## 2024-07-29 NOTE — Patient Instructions (Addendum)
 Medication Instructions:   Decrease Diltiazem  CD to 120mg  daily  Continue all other medications.     Labwork:  none  Testing/Procedures:  none  Follow-Up:  Your physician wants you to follow up in:  1 year.  You should receive a recall letter in the mail about 2 months prior to the time you are due.  If you don't receive this, please call our office to schedule your follow up appointment.      Any Other Special Instructions Will Be Listed Below (If Applicable).   If you need a refill on your cardiac medications before your next appointment, please call your pharmacy.

## 2024-08-16 DIAGNOSIS — E1165 Type 2 diabetes mellitus with hyperglycemia: Secondary | ICD-10-CM | POA: Diagnosis not present

## 2024-08-31 DIAGNOSIS — S9031XA Contusion of right foot, initial encounter: Secondary | ICD-10-CM | POA: Diagnosis not present

## 2024-08-31 DIAGNOSIS — M79674 Pain in right toe(s): Secondary | ICD-10-CM | POA: Diagnosis not present

## 2024-08-31 DIAGNOSIS — M79671 Pain in right foot: Secondary | ICD-10-CM | POA: Diagnosis not present

## 2024-09-01 DIAGNOSIS — M2011 Hallux valgus (acquired), right foot: Secondary | ICD-10-CM | POA: Diagnosis not present

## 2024-09-01 DIAGNOSIS — M79671 Pain in right foot: Secondary | ICD-10-CM | POA: Diagnosis not present

## 2024-09-01 DIAGNOSIS — S9031XA Contusion of right foot, initial encounter: Secondary | ICD-10-CM | POA: Diagnosis not present

## 2024-09-07 DIAGNOSIS — L820 Inflamed seborrheic keratosis: Secondary | ICD-10-CM | POA: Diagnosis not present

## 2024-09-07 DIAGNOSIS — L570 Actinic keratosis: Secondary | ICD-10-CM | POA: Diagnosis not present

## 2024-09-07 DIAGNOSIS — D485 Neoplasm of uncertain behavior of skin: Secondary | ICD-10-CM | POA: Diagnosis not present

## 2024-09-07 DIAGNOSIS — M1990 Unspecified osteoarthritis, unspecified site: Secondary | ICD-10-CM | POA: Diagnosis not present

## 2024-09-07 DIAGNOSIS — M79671 Pain in right foot: Secondary | ICD-10-CM | POA: Diagnosis not present

## 2024-09-07 DIAGNOSIS — G5750 Tarsal tunnel syndrome, unspecified lower limb: Secondary | ICD-10-CM | POA: Diagnosis not present

## 2024-09-15 ENCOUNTER — Ambulatory Visit (HOSPITAL_COMMUNITY)
Admission: RE | Admit: 2024-09-15 | Discharge: 2024-09-15 | Disposition: A | Discharge status: Home / Self Care | Source: Ambulatory Visit | Attending: Urology | Admitting: Urology

## 2024-09-15 DIAGNOSIS — N1330 Unspecified hydronephrosis: Secondary | ICD-10-CM | POA: Diagnosis not present

## 2024-09-15 DIAGNOSIS — N200 Calculus of kidney: Secondary | ICD-10-CM | POA: Diagnosis not present

## 2024-09-19 ENCOUNTER — Encounter: Payer: Self-pay | Admitting: Urology

## 2024-09-19 ENCOUNTER — Ambulatory Visit: Admitting: Urology

## 2024-09-19 VITALS — BP 131/79 | HR 87

## 2024-09-19 DIAGNOSIS — Z09 Encounter for follow-up examination after completed treatment for conditions other than malignant neoplasm: Secondary | ICD-10-CM

## 2024-09-19 DIAGNOSIS — Z87442 Personal history of urinary calculi: Secondary | ICD-10-CM

## 2024-09-19 DIAGNOSIS — N200 Calculus of kidney: Secondary | ICD-10-CM

## 2024-09-19 LAB — URINALYSIS, ROUTINE W REFLEX MICROSCOPIC
Bilirubin, UA: NEGATIVE
Glucose, UA: NEGATIVE
Nitrite, UA: NEGATIVE
Protein,UA: NEGATIVE
RBC, UA: NEGATIVE
Specific Gravity, UA: 1.015 (ref 1.005–1.030)
Urobilinogen, Ur: 1 mg/dL (ref 0.2–1.0)
pH, UA: 7 (ref 5.0–7.5)

## 2024-09-19 LAB — MICROSCOPIC EXAMINATION

## 2024-09-19 MED ORDER — CIPROFLOXACIN HCL 500 MG PO TABS: 500.0000 mg | ORAL_TABLET | Freq: Once | ORAL | Status: DC

## 2024-09-19 NOTE — Progress Notes (Unsigned)
 09/19/2024 11:40 AM   [name] Jan 23, 1946 [id number]  Referring provider: Rosamond Leta NOVAK, MD [address] [place],  [place] [address]  No chief complaint on file.   HPI: [name] is a 78yo here for followup for nephrolithiasis. No stone events since last visit. No flank pain. Renal US  shows no calculi and no hydronephrosis   PMH: Past Medical History:  Diagnosis Date   Anxiety    Arthritis    Atrial fibrillation [facility])    Documented December 2019   Complication of anesthesia    slow to wake up   Depression    GERD (gastroesophageal reflux disease)    Heart murmur    Hyperlipidemia    Hypertension    Hypothyroidism    PONV (postoperative nausea and vomiting)     Surgical History: Past Surgical History:  Procedure Laterality Date   APPENDECTOMY     ATRIAL FIBRILLATION ABLATION N/A 04/05/2024   Procedure: ATRIAL FIBRILLATION ABLATION;  Surgeon: Nancey Eulas BRAVO, MD;  Location: [facility];  Service: Cardiovascular;  Laterality: N/A;   BUNIONECTOMY Bilateral    CATARACT EXTRACTION W/PHACO Right 10/09/2017   Procedure: CATARACT EXTRACTION PHACO AND INTRAOCULAR LENS PLACEMENT (IOC);  Surgeon: Harrie Agent, MD;  Location: [facility];  Service: Ophthalmology;  Laterality: Right;  CDE: 6.65   CATARACT EXTRACTION W/PHACO Left 12/11/2017   Procedure: CATARACT EXTRACTION PHACO AND INTRAOCULAR LENS PLACEMENT (IOC);  Surgeon: Harrie Agent, MD;  Location: [facility];  Service: Ophthalmology;  Laterality: Left;  CDE: 5.28   COLONOSCOPY  06/18/2012   Procedure: COLONOSCOPY;  Surgeon: Claudis RAYMOND Rivet, MD;  Location: [facility];  Service: Endoscopy;  Laterality: N/A;  730   COLONOSCOPY N/A 07/04/2019   Rehman: one 7 mm polyp in proximal ascending colon, removed, clips placed to treat postpolypectomy bleed (tubular adenoma), diverticulosis in sigmoid colon, hepatic flexure, external hemorrhoids   CYSTOSCOPY WITH RETROGRADE PYELOGRAM, URETEROSCOPY AND STENT PLACEMENT Left  06/09/2024   Procedure: CYSTOURETEROSCOPY, WITH RETROGRADE PYELOGRAM AND STENT INSERTION;  Surgeon: Sherrilee Belvie CROME, MD;  Location: [facility];  Service: Urology;  Laterality: Left;   CYSTOSCOPY/RETROGRADE/URETEROSCOPY/STONE EXTRACTION WITH BASKET  06/09/2024   Procedure: CYSTOSCOPY, WITH CALCULUS REMOVAL USING BASKET;  Surgeon: Sherrilee Belvie CROME, MD;  Location: [facility];  Service: Urology;;   ESOPHAGOGASTRODUODENOSCOPY     Hammer toe repair Left    HEMORRHOID SURGERY     KNEE ARTHROSCOPY     left 06-2019   REVERSE SHOULDER ARTHROPLASTY Right 07/12/2021   Procedure: REVERSE SHOULDER ARTHROPLASTY;  Surgeon: Kay Kemps, MD;  Location: [facility];  Service: Orthopedics;  Laterality: Right;  with ISB   SHOULDER ARTHROSCOPY     right   THYROIDECTOMY     TONSILLECTOMY     TOTAL ABDOMINAL HYSTERECTOMY     TOTAL KNEE ARTHROPLASTY Right 06/29/2020   Procedure: TOTAL KNEE ARTHROPLASTY;  Surgeon: Kay Kemps, MD;  Location: [facility];  Service: Orthopedics;  Laterality: Right;  adductor canal   TOTAL KNEE ARTHROPLASTY Left 11/13/2023   Procedure: TOTAL KNEE ARTHROPLASTY;  Surgeon: Kay Kemps, MD;  Location: [facility];  Service: Orthopedics;  Laterality: Left;  general anesthesia 120 flip room   TOTAL SHOULDER ARTHROPLASTY Left 02/06/2017   TOTAL SHOULDER ARTHROPLASTY Left 02/06/2017   Procedure: TOTAL SHOULDER ARTHROPLASTY;  Surgeon: Kemps Kay, MD;  Location: [facility] OR;  Service: Orthopedics;  Laterality: Left;    Home Medications:  Allergies as of 09/19/2024       Reactions   Other Nausea And Vomiting  Opiate based pain medicines    Latex Rash   Lexiscan  [regadenoson ] Nausea Only, Other (See Comments)   See 12/14/2023 Lexiscan  report for details-hypotension and nausea   Penicillins Rash   Sulfa Antibiotics Rash   Tramadol Itching        Medication List        Accurate as of September 19, 2024 11:40 AM. If you have any questions, ask your nurse or doctor.          ALPRAZolam  0.25  MG tablet Commonly known as: XANAX  Take 0.25 mg by mouth at bedtime as needed for sleep.   apixaban  5 MG Tabs tablet Commonly known as: ELIQUIS  Take 1 tablet (5 mg total) by mouth 2 (two) times daily.   citalopram  20 MG tablet Commonly known as: CELEXA  Take 20 mg by mouth daily.   diltiazem  120 MG 24 hr capsule Commonly known as: CARDIZEM  CD Take 1 capsule (120 mg total) by mouth daily.   levothyroxine  75 MCG tablet Commonly known as: SYNTHROID  Take 75 mcg by mouth daily before breakfast.   MAGNESIUM  PO Take 1 tablet by mouth daily. Taking 1 chewable by mouth daily   metoprolol  tartrate 25 MG tablet Commonly known as: LOPRESSOR  TAKE 1 TABLET BY MOUTH EVERY 8  HOURS AS NEEDED FOR PALPITATIONS What changed: See the new instructions.   multivitamin with minerals tablet Take 1 tablet by mouth daily. One A Day for Women   Pataday  0.1 % ophthalmic solution Generic drug: olopatadine  Place 1 drop into both eyes daily.   polyethylene glycol 17 g packet Commonly known as: MIRALAX  / GLYCOLAX  Take 17 g by mouth daily as needed for moderate constipation.   rosuvastatin  5 MG tablet Commonly known as: CRESTOR  TAKE 1 TABLET BY MOUTH DAILY   spironolactone  25 MG tablet Commonly known as: ALDACTONE  TAKE 1 TABLET BY MOUTH  DAILY   Vitamin D3 50 MCG (2000 UT) capsule Take 4,000 Units by mouth daily.        Allergies:  Allergies  Allergen Reactions   Other Nausea And Vomiting    Opiate based pain medicines    Latex Rash   Lexiscan  [Regadenoson ] Nausea Only and Other (See Comments)    See 12/14/2023 Lexiscan  report for details-hypotension and nausea   Penicillins Rash   Sulfa Antibiotics Rash   Tramadol Itching    Family History: Family History  Problem Relation Age of Onset   Atrial fibrillation Sister    Colon cancer Neg Hx    Breast cancer Neg Hx    Parkinson's disease Neg Hx    Tremor Neg Hx     Social History:  reports that she has never smoked. She has  never been exposed to tobacco smoke. She has never used smokeless tobacco. She reports that she does not drink alcohol  and does not use drugs.  ROS: All other review of systems were reviewed and are negative except what is noted above in HPI  Physical Exam: BP 131/79   Pulse 87   Constitutional:  Alert and oriented, No acute distress. HEENT:  AT, moist mucus membranes.  Trachea midline, no masses. Cardiovascular: No clubbing, cyanosis, or edema. Respiratory: Normal respiratory effort, no increased work of breathing. GI: Abdomen is soft, nontender, nondistended, no abdominal masses GU: No CVA tenderness.  Lymph: No cervical or inguinal lymphadenopathy. Skin: No rashes, bruises or suspicious lesions. Neurologic: Grossly intact, no focal deficits, moving all 4 extremities. Psychiatric: Normal mood and affect.  Laboratory Data: Lab Results  Component Value  Date   WBC 5.6 06/29/2024   HGB 15.7 06/29/2024   HCT 49.1 (H) 06/29/2024   MCV 96 06/29/2024   PLT 294 06/29/2024    Lab Results  Component Value Date   CREATININE 0.95 06/10/2024    No results found for: PSA  No results found for: TESTOSTERONE  No results found for: HGBA1C  Urinalysis    Component Value Date/Time   COLORURINE AMBER (A) 06/07/2024 1115   APPEARANCEUR Clear 06/17/2024 0851   LABSPEC 1.032 (H) 06/07/2024 1115   PHURINE 5.0 06/07/2024 1115   GLUCOSEU Negative 06/17/2024 0851   HGBUR MODERATE (A) 06/07/2024 1115   BILIRUBINUR Negative 06/17/2024 0851   KETONESUR 5 (A) 06/07/2024 1115   PROTEINUR 3+ (A) 06/17/2024 0851   PROTEINUR 100 (A) 06/07/2024 1115   NITRITE Negative 06/17/2024 0851   NITRITE NEGATIVE 06/07/2024 1115   LEUKOCYTESUR Trace (A) 06/17/2024 0851   LEUKOCYTESUR NEGATIVE 06/07/2024 1115    Lab Results  Component Value Date   LABMICR See below: 06/17/2024   WBCUA 6-10 (A) 06/17/2024   LABEPIT 0-10 06/17/2024   BACTERIA Moderate (A) 06/17/2024    Pertinent  Imaging: *** Results for orders placed during the hospital encounter of 04/09/20  DG Abd 1 View  Narrative CLINICAL DATA:  Assess for intrapelvic clips  EXAM: ABDOMEN - 1 VIEW  COMPARISON:  None.  FINDINGS: Nonobstructive bowel gas pattern. Mild-to-moderate colonic stool burden. 2.2 cm surgical clip projects over the mid right hemipelvis. Pelvic phleboliths noted. Dextrocurvature at the thoracolumbar junction. No acute osseous findings.  IMPRESSION: 1. Surgical clip within the right hemipelvis. 2. Nonobstructive bowel gas pattern. Mild to moderate colonic stool burden.   Electronically Signed By: Mabel Converse D.O. On: 04/09/2020 14:45  No results found for this or any previous visit.  No results found for this or any previous visit.  No results found for this or any previous visit.  Results for orders placed during the hospital encounter of 09/15/24  US  RENAL  Narrative EXAM: US  Retroperitoneum Complete, Renal.  CLINICAL HISTORY: nephrolithiasis.  TECHNIQUE: Real-time ultrasound of the retroperitoneum (complete) with image documentation.  COMPARISON: Prior CT examination of June 07, 2024.  FINDINGS:  RIGHT KIDNEY: The right kidney measures 10.7 x 4.2 x 5.7 cm with a volume of 133 cc. Mild to moderate hydronephrosis is seen, which appears slightly progressive when compared to the prior CT examination of June 07, 2024, involving the mid and lower pole, possibly reflecting an underlying duplicated renal collecting system. The renal cortical thickness is preserved, and the cortical echogenicity is normal. No intrarenal mass or calcifications are seen.  LEFT KIDNEY: The left kidney measures 10.5 x 5.6 x 4.2 cm with an estimated volume of 129 cc. There is a limited evaluation of the lower pole due to overlying bowel gas. The visualized cortex demonstrates normal thickness and cortical echogenicity. The previously noted hydronephrosis has  resolved.  BLADDER: The bladder is unremarkable. Bilateral ureteral jets are identified.  IMPRESSION: 1. Mild to moderate right hydronephrosis, slightly progressive compared to prior CT, possibly reflecting an underlying duplicated renal collecting system. Alternatively, this may represent multiple parapelvic cysts though the interval change makes this less likely. This could be better assessed with contrast-enhanced CT urography if indicated. 2. Resolved left hydronephrosis.  Electronically signed by: Dorethia Molt MD 09/18/2024 03:10 AM EDT RP Workstation: [id number]  No results found for this or any previous visit.  No results found for this or any previous visit.  Results for orders placed during  the hospital encounter of 06/07/24  CT Renal Stone Study  Narrative CLINICAL DATA:  Recurrent left flank pain, hematuria,  EXAM: CT ABDOMEN AND PELVIS WITHOUT CONTRAST  TECHNIQUE: Multidetector CT imaging of the abdomen and pelvis was performed following the standard protocol without IV contrast.  RADIATION DOSE REDUCTION: This exam was performed according to the departmental dose-optimization program which includes automated exposure control, adjustment of the mA and/or kV according to patient size and/or use of iterative reconstruction technique.  COMPARISON:  June 01, 2024  FINDINGS: Lower chest: Unremarkable.  Hepatobiliary: No suspicious liver lesion. No gallstones, gallbladder wall thickening, or biliary dilatation.  Pancreas: Unremarkable.  Spleen: Unremarkable.  Adrenals/Urinary Tract: Adrenal glands are unremarkable. Moderate left hydronephrosis with 0.3 cm obstructing ureteral stone now located in the left distal ureter near the UVJ. Fullness of the right kidney lower pole, possibly related to parapelvic cysts, similar to prior. No definite right-sided nephrolithiasis. Likely punctate nonobstructing left renal stones.  Stomach/Bowel: No evidence of bowel  obstruction or inflammation.  Vascular/Lymphatic: Normal caliber aorta. No lymphadenopathy by size criteria.  Reproductive: Status post hysterectomy. No adnexal masses.  Other: No free air or free fluid.  Musculoskeletal: Unchanged mild L1 vertebral body compression deformity.  IMPRESSION: 1. Interval advancement of a 0.3 cm obstructing left ureteral stone, now located in the distal ureter near the UVJ with persistent left hydronephrosis.  2.  Unchanged mild L1 vertebral body compression fracture.   Electronically Signed By: Michaeline Blanch M.D. On: 06/07/2024 13:25   Assessment & Plan:    1. Kidney stones (Primary) Followup 6 months with a renal US  - Urinalysis, Routine w reflex microscopic; Future - Cystoscopy (Bedside) - ciprofloxacin  (CIPRO ) tablet 500 mg - Urinalysis, Routine w reflex microscopic   No follow-ups on file.  Belvie Clara, MD  [facility] [place]

## 2024-09-19 NOTE — Patient Instructions (Signed)

## 2024-12-22 ENCOUNTER — Other Ambulatory Visit: Payer: Self-pay | Admitting: Internal Medicine

## 2024-12-22 DIAGNOSIS — Z1231 Encounter for screening mammogram for malignant neoplasm of breast: Secondary | ICD-10-CM

## 2024-12-27 ENCOUNTER — Inpatient Hospital Stay: Admission: RE | Admit: 2024-12-27 | Source: Ambulatory Visit

## 2025-01-04 ENCOUNTER — Ambulatory Visit: Admitting: Cardiology

## 2025-02-28 ENCOUNTER — Ambulatory Visit: Admitting: Gastroenterology

## 2025-03-02 ENCOUNTER — Other Ambulatory Visit (HOSPITAL_COMMUNITY)

## 2025-03-22 ENCOUNTER — Ambulatory Visit: Admitting: Urology
